# Patient Record
Sex: Male | Born: 1950 | Race: White | Hispanic: No | Marital: Married | Smoking: Never smoker
Health system: Southern US, Community
[De-identification: ages and names within clinical notes are randomized; demographics above are authoritative.]

## PROBLEM LIST (undated history)

## (undated) DIAGNOSIS — R059 Cough, unspecified: Secondary | ICD-10-CM

## (undated) DIAGNOSIS — E119 Type 2 diabetes mellitus without complications: Secondary | ICD-10-CM

## (undated) DIAGNOSIS — R05 Cough: Secondary | ICD-10-CM

## (undated) DIAGNOSIS — K219 Gastro-esophageal reflux disease without esophagitis: Secondary | ICD-10-CM

## (undated) DIAGNOSIS — E669 Obesity, unspecified: Secondary | ICD-10-CM

## (undated) DIAGNOSIS — I480 Paroxysmal atrial fibrillation: Secondary | ICD-10-CM

## (undated) DIAGNOSIS — E039 Hypothyroidism, unspecified: Secondary | ICD-10-CM

## (undated) DIAGNOSIS — E782 Mixed hyperlipidemia: Secondary | ICD-10-CM

## (undated) DIAGNOSIS — I10 Essential (primary) hypertension: Secondary | ICD-10-CM

## (undated) DIAGNOSIS — I509 Heart failure, unspecified: Secondary | ICD-10-CM

## (undated) DIAGNOSIS — I252 Old myocardial infarction: Secondary | ICD-10-CM

## (undated) DIAGNOSIS — Z95810 Presence of automatic (implantable) cardiac defibrillator: Secondary | ICD-10-CM

## (undated) DIAGNOSIS — R609 Edema, unspecified: Secondary | ICD-10-CM

## (undated) DIAGNOSIS — M1990 Unspecified osteoarthritis, unspecified site: Secondary | ICD-10-CM

## (undated) DIAGNOSIS — I200 Unstable angina: Secondary | ICD-10-CM

## (undated) DIAGNOSIS — I2510 Atherosclerotic heart disease of native coronary artery without angina pectoris: Secondary | ICD-10-CM

## (undated) DIAGNOSIS — Z951 Presence of aortocoronary bypass graft: Secondary | ICD-10-CM

## (undated) DIAGNOSIS — I25110 Atherosclerotic heart disease of native coronary artery with unstable angina pectoris: Secondary | ICD-10-CM

## (undated) HISTORY — PX: BACK SURGERY: SHX140

## (undated) HISTORY — PX: CHOLECYSTECTOMY: SHX55

## (undated) HISTORY — PX: EYE SURGERY: SHX253

---

## 2001-07-20 ENCOUNTER — Ambulatory Visit (HOSPITAL_BASED_OUTPATIENT_CLINIC_OR_DEPARTMENT_OTHER): Admission: RE | Admit: 2001-07-20 | Discharge: 2001-07-20 | Payer: Self-pay | Admitting: Orthopedic Surgery

## 2001-09-05 ENCOUNTER — Encounter: Admission: RE | Admit: 2001-09-05 | Discharge: 2001-09-05 | Payer: Self-pay

## 2001-09-14 ENCOUNTER — Encounter: Payer: Self-pay | Admitting: Neurosurgery

## 2001-09-15 ENCOUNTER — Encounter: Payer: Self-pay | Admitting: Neurosurgery

## 2001-09-16 ENCOUNTER — Inpatient Hospital Stay (HOSPITAL_COMMUNITY): Admission: AD | Admit: 2001-09-16 | Discharge: 2001-09-18 | Payer: Self-pay | Admitting: Neurosurgery

## 2009-08-19 ENCOUNTER — Inpatient Hospital Stay: Payer: Self-pay | Admitting: Surgery

## 2015-02-26 DIAGNOSIS — M1990 Unspecified osteoarthritis, unspecified site: Secondary | ICD-10-CM | POA: Diagnosis not present

## 2015-02-26 DIAGNOSIS — M7052 Other bursitis of knee, left knee: Secondary | ICD-10-CM | POA: Diagnosis not present

## 2015-02-26 DIAGNOSIS — L02212 Cutaneous abscess of back [any part, except buttock]: Secondary | ICD-10-CM | POA: Diagnosis not present

## 2015-02-26 DIAGNOSIS — Z6834 Body mass index (BMI) 34.0-34.9, adult: Secondary | ICD-10-CM | POA: Diagnosis not present

## 2015-03-07 DIAGNOSIS — M170 Bilateral primary osteoarthritis of knee: Secondary | ICD-10-CM | POA: Diagnosis not present

## 2015-04-01 DIAGNOSIS — E782 Mixed hyperlipidemia: Secondary | ICD-10-CM | POA: Diagnosis not present

## 2015-04-01 DIAGNOSIS — Z79899 Other long term (current) drug therapy: Secondary | ICD-10-CM | POA: Diagnosis not present

## 2015-04-01 DIAGNOSIS — E291 Testicular hypofunction: Secondary | ICD-10-CM | POA: Diagnosis not present

## 2015-04-01 DIAGNOSIS — E1165 Type 2 diabetes mellitus with hyperglycemia: Secondary | ICD-10-CM | POA: Diagnosis not present

## 2015-04-04 DIAGNOSIS — I10 Essential (primary) hypertension: Secondary | ICD-10-CM | POA: Diagnosis not present

## 2015-04-04 DIAGNOSIS — Z6834 Body mass index (BMI) 34.0-34.9, adult: Secondary | ICD-10-CM | POA: Diagnosis not present

## 2015-04-04 DIAGNOSIS — E782 Mixed hyperlipidemia: Secondary | ICD-10-CM | POA: Diagnosis not present

## 2015-04-04 DIAGNOSIS — E1165 Type 2 diabetes mellitus with hyperglycemia: Secondary | ICD-10-CM | POA: Diagnosis not present

## 2015-04-04 DIAGNOSIS — E291 Testicular hypofunction: Secondary | ICD-10-CM | POA: Diagnosis not present

## 2015-04-11 DIAGNOSIS — M1711 Unilateral primary osteoarthritis, right knee: Secondary | ICD-10-CM | POA: Diagnosis not present

## 2015-04-11 DIAGNOSIS — M25462 Effusion, left knee: Secondary | ICD-10-CM | POA: Diagnosis not present

## 2015-04-11 DIAGNOSIS — M25461 Effusion, right knee: Secondary | ICD-10-CM | POA: Diagnosis not present

## 2015-04-11 DIAGNOSIS — M1712 Unilateral primary osteoarthritis, left knee: Secondary | ICD-10-CM | POA: Diagnosis not present

## 2015-04-12 ENCOUNTER — Other Ambulatory Visit: Payer: Self-pay | Admitting: Orthopedic Surgery

## 2015-04-12 DIAGNOSIS — M25461 Effusion, right knee: Secondary | ICD-10-CM

## 2015-04-12 DIAGNOSIS — M25462 Effusion, left knee: Principal | ICD-10-CM

## 2015-04-19 ENCOUNTER — Ambulatory Visit
Admission: RE | Admit: 2015-04-19 | Discharge: 2015-04-19 | Disposition: A | Discharge status: Home / Self Care | Payer: Medicare Other | Source: Ambulatory Visit | Attending: Orthopedic Surgery | Admitting: Orthopedic Surgery

## 2015-04-19 DIAGNOSIS — M25461 Effusion, right knee: Secondary | ICD-10-CM

## 2015-04-19 DIAGNOSIS — M25462 Effusion, left knee: Principal | ICD-10-CM

## 2015-05-08 DIAGNOSIS — M170 Bilateral primary osteoarthritis of knee: Secondary | ICD-10-CM | POA: Diagnosis not present

## 2015-05-08 DIAGNOSIS — M25552 Pain in left hip: Secondary | ICD-10-CM | POA: Diagnosis not present

## 2015-05-17 DIAGNOSIS — M25462 Effusion, left knee: Secondary | ICD-10-CM | POA: Diagnosis not present

## 2015-05-17 DIAGNOSIS — S83282A Other tear of lateral meniscus, current injury, left knee, initial encounter: Secondary | ICD-10-CM | POA: Diagnosis not present

## 2015-05-17 DIAGNOSIS — S83232A Complex tear of medial meniscus, current injury, left knee, initial encounter: Secondary | ICD-10-CM | POA: Diagnosis not present

## 2015-07-29 DIAGNOSIS — E782 Mixed hyperlipidemia: Secondary | ICD-10-CM | POA: Diagnosis not present

## 2015-07-29 DIAGNOSIS — Z79899 Other long term (current) drug therapy: Secondary | ICD-10-CM | POA: Diagnosis not present

## 2015-07-29 DIAGNOSIS — E1165 Type 2 diabetes mellitus with hyperglycemia: Secondary | ICD-10-CM | POA: Diagnosis not present

## 2015-07-31 DIAGNOSIS — Z6833 Body mass index (BMI) 33.0-33.9, adult: Secondary | ICD-10-CM | POA: Diagnosis not present

## 2015-07-31 DIAGNOSIS — Z1389 Encounter for screening for other disorder: Secondary | ICD-10-CM | POA: Diagnosis not present

## 2015-07-31 DIAGNOSIS — E782 Mixed hyperlipidemia: Secondary | ICD-10-CM | POA: Diagnosis not present

## 2015-07-31 DIAGNOSIS — I10 Essential (primary) hypertension: Secondary | ICD-10-CM | POA: Diagnosis not present

## 2015-07-31 DIAGNOSIS — E1165 Type 2 diabetes mellitus with hyperglycemia: Secondary | ICD-10-CM | POA: Diagnosis not present

## 2015-07-31 DIAGNOSIS — Z23 Encounter for immunization: Secondary | ICD-10-CM | POA: Diagnosis not present

## 2015-07-31 DIAGNOSIS — E291 Testicular hypofunction: Secondary | ICD-10-CM | POA: Diagnosis not present

## 2015-07-31 DIAGNOSIS — Z9181 History of falling: Secondary | ICD-10-CM | POA: Diagnosis not present

## 2015-09-30 DIAGNOSIS — M23204 Derangement of unspecified medial meniscus due to old tear or injury, left knee: Secondary | ICD-10-CM | POA: Diagnosis not present

## 2015-09-30 DIAGNOSIS — M1712 Unilateral primary osteoarthritis, left knee: Secondary | ICD-10-CM | POA: Diagnosis not present

## 2015-09-30 DIAGNOSIS — M23201 Derangement of unspecified lateral meniscus due to old tear or injury, left knee: Secondary | ICD-10-CM | POA: Diagnosis not present

## 2015-10-03 ENCOUNTER — Encounter
Admission: RE | Admit: 2015-10-03 | Discharge: 2015-10-03 | Disposition: A | Discharge status: Home / Self Care | Payer: Medicare Other | Source: Ambulatory Visit | Attending: Orthopedic Surgery | Admitting: Orthopedic Surgery

## 2015-10-03 DIAGNOSIS — Z01812 Encounter for preprocedural laboratory examination: Secondary | ICD-10-CM | POA: Insufficient documentation

## 2015-10-03 DIAGNOSIS — Z01810 Encounter for preprocedural cardiovascular examination: Secondary | ICD-10-CM | POA: Insufficient documentation

## 2015-10-03 DIAGNOSIS — I10 Essential (primary) hypertension: Secondary | ICD-10-CM | POA: Diagnosis not present

## 2015-10-03 HISTORY — DX: Essential (primary) hypertension: I10

## 2015-10-03 HISTORY — DX: Gastro-esophageal reflux disease without esophagitis: K21.9

## 2015-10-03 HISTORY — DX: Unspecified osteoarthritis, unspecified site: M19.90

## 2015-10-03 HISTORY — DX: Type 2 diabetes mellitus without complications: E11.9

## 2015-10-03 LAB — BASIC METABOLIC PANEL
Anion gap: 11 (ref 5–15)
BUN: 22 mg/dL — ABNORMAL HIGH (ref 6–20)
CALCIUM: 9.8 mg/dL (ref 8.9–10.3)
CO2: 24 mmol/L (ref 22–32)
CREATININE: 1.15 mg/dL (ref 0.61–1.24)
Chloride: 99 mmol/L — ABNORMAL LOW (ref 101–111)
GLUCOSE: 272 mg/dL — AB (ref 65–99)
Potassium: 4.1 mmol/L (ref 3.5–5.1)
Sodium: 134 mmol/L — ABNORMAL LOW (ref 135–145)

## 2015-10-03 LAB — HEMOGLOBIN: Hemoglobin: 14.5 g/dL (ref 13.0–18.0)

## 2015-10-03 NOTE — Patient Instructions (Signed)
  Your procedure is scheduled on: October 08, 2015 (Tuesday) Report to Day Surgery.[facility]) Second Floor To find out your arrival time please call [phone] between 1PM - 3PM on October 07, 2015 (Monday).  Remember: Instructions that are not followed completely may result in serious medical risk, up to and including death, or upon the discretion of your surgeon and anesthesiologist your surgery may need to be rescheduled.    __x__ 1. Do not eat food or drink liquids after midnight. No gum chewing or hard candies.     ____ 2. No Alcohol for 24 hours before or after surgery.   ____ 3. Bring all medications with you on the day of surgery if instructed.    __x__ 4. Notify your doctor if there is any change in your medical condition     (cold, fever, infections).     Do not wear jewelry, make-up, hairpins, clips or nail polish.  Do not wear lotions, powders, or perfumes. You may wear deodorant.  Do not shave 48 hours prior to surgery. Men may shave face and neck.  Do not bring valuables to the hospital.    [facility] is not responsible for any belongings or valuables.               Contacts, dentures or bridgework may not be worn into surgery.  Leave your suitcase in the car. After surgery it may be brought to your room.  For patients admitted to the hospital, discharge time is determined by your                treatment team.   Patients discharged the day of surgery will not be allowed to drive home.   Please read over the following fact sheets that you were given:   Surgical Site Infection Prevention   ____ Take these medicines the morning of surgery with A SIP OF WATER:    1. Gemfibrozil  2. Carvedilol  3.   4.  5.  6.  ____ Fleet Enema (as directed)   __x__ Use CHG Soap as directed  ____ Use inhalers on the day of surgery  __x__ Stop metformin 2 days prior to surgery (STOP METFORMIN ON December 11)    ____ Take 1/2 of usual insulin dose the night before  surgery and none on the morning of surgery.   ____ Stop Coumadin/Plavix/aspirin on   __x__ Stop Anti-inflammatories on (TYLENOL OK TO TAKE FOR PAIN IF NEEDED)   ____ Stop supplements until after surgery.    ____ Bring C-Pap to the hospital.

## 2015-10-08 ENCOUNTER — Encounter
Admission: RE | Disposition: A | Discharge status: Home / Self Care | Payer: Self-pay | Source: Ambulatory Visit | Attending: Orthopedic Surgery

## 2015-10-08 ENCOUNTER — Ambulatory Visit: Payer: Medicare Other | Admitting: Anesthesiology

## 2015-10-08 ENCOUNTER — Ambulatory Visit
Admission: RE | Admit: 2015-10-08 | Discharge: 2015-10-08 | Disposition: A | Discharge status: Home / Self Care | Payer: Medicare Other | Source: Ambulatory Visit | Attending: Orthopedic Surgery | Admitting: Orthopedic Surgery

## 2015-10-08 ENCOUNTER — Encounter: Payer: Self-pay | Admitting: *Deleted

## 2015-10-08 DIAGNOSIS — M1990 Unspecified osteoarthritis, unspecified site: Secondary | ICD-10-CM | POA: Diagnosis not present

## 2015-10-08 DIAGNOSIS — Z9889 Other specified postprocedural states: Secondary | ICD-10-CM | POA: Diagnosis not present

## 2015-10-08 DIAGNOSIS — E785 Hyperlipidemia, unspecified: Secondary | ICD-10-CM | POA: Insufficient documentation

## 2015-10-08 DIAGNOSIS — M23252 Derangement of posterior horn of lateral meniscus due to old tear or injury, left knee: Secondary | ICD-10-CM | POA: Diagnosis not present

## 2015-10-08 DIAGNOSIS — Z9861 Coronary angioplasty status: Secondary | ICD-10-CM | POA: Insufficient documentation

## 2015-10-08 DIAGNOSIS — Z880 Allergy status to penicillin: Secondary | ICD-10-CM | POA: Diagnosis not present

## 2015-10-08 DIAGNOSIS — Z8249 Family history of ischemic heart disease and other diseases of the circulatory system: Secondary | ICD-10-CM | POA: Diagnosis not present

## 2015-10-08 DIAGNOSIS — E119 Type 2 diabetes mellitus without complications: Secondary | ICD-10-CM | POA: Diagnosis not present

## 2015-10-08 DIAGNOSIS — E291 Testicular hypofunction: Secondary | ICD-10-CM | POA: Diagnosis not present

## 2015-10-08 DIAGNOSIS — M2342 Loose body in knee, left knee: Secondary | ICD-10-CM | POA: Diagnosis not present

## 2015-10-08 DIAGNOSIS — Z888 Allergy status to other drugs, medicaments and biological substances status: Secondary | ICD-10-CM | POA: Insufficient documentation

## 2015-10-08 DIAGNOSIS — Z79899 Other long term (current) drug therapy: Secondary | ICD-10-CM | POA: Insufficient documentation

## 2015-10-08 DIAGNOSIS — I10 Essential (primary) hypertension: Secondary | ICD-10-CM | POA: Insufficient documentation

## 2015-10-08 DIAGNOSIS — M2392 Unspecified internal derangement of left knee: Secondary | ICD-10-CM | POA: Diagnosis present

## 2015-10-08 DIAGNOSIS — S83282A Other tear of lateral meniscus, current injury, left knee, initial encounter: Secondary | ICD-10-CM | POA: Diagnosis not present

## 2015-10-08 DIAGNOSIS — M23204 Derangement of unspecified medial meniscus due to old tear or injury, left knee: Secondary | ICD-10-CM | POA: Insufficient documentation

## 2015-10-08 DIAGNOSIS — M1712 Unilateral primary osteoarthritis, left knee: Secondary | ICD-10-CM | POA: Diagnosis not present

## 2015-10-08 DIAGNOSIS — M23201 Derangement of unspecified lateral meniscus due to old tear or injury, left knee: Secondary | ICD-10-CM | POA: Diagnosis not present

## 2015-10-08 DIAGNOSIS — Z886 Allergy status to analgesic agent status: Secondary | ICD-10-CM | POA: Insufficient documentation

## 2015-10-08 DIAGNOSIS — S83242A Other tear of medial meniscus, current injury, left knee, initial encounter: Secondary | ICD-10-CM | POA: Diagnosis not present

## 2015-10-08 DIAGNOSIS — M6752 Plica syndrome, left knee: Secondary | ICD-10-CM | POA: Diagnosis not present

## 2015-10-08 DIAGNOSIS — S80852A Superficial foreign body, left lower leg, initial encounter: Secondary | ICD-10-CM | POA: Diagnosis not present

## 2015-10-08 HISTORY — PX: KNEE ARTHROSCOPY: SHX127

## 2015-10-08 LAB — GLUCOSE, CAPILLARY
Glucose-Capillary: 204 mg/dL — ABNORMAL HIGH (ref 65–99)
Glucose-Capillary: 138 mg/dL — ABNORMAL HIGH (ref 65–99)

## 2015-10-08 SURGERY — ARTHROSCOPY, KNEE
Anesthesia: General | Site: Knee | Laterality: Left | Wound class: Clean

## 2015-10-08 MED ORDER — ONDANSETRON HCL 4 MG/2ML IJ SOLN
INTRAMUSCULAR | Status: DC | PRN
  Administered 2015-10-08: 4 mg via INTRAVENOUS

## 2015-10-08 MED ORDER — BUPIVACAINE-EPINEPHRINE (PF) 0.5% -1:200000 IJ SOLN
INTRAMUSCULAR | Status: DC | PRN
  Administered 2015-10-08: 30 mL

## 2015-10-08 MED ORDER — ONDANSETRON HCL 4 MG/2ML IJ SOLN: 4.0000 mg | Freq: Four times a day (QID) | INTRAMUSCULAR | Status: DC | PRN

## 2015-10-08 MED ORDER — FENTANYL CITRATE (PF) 100 MCG/2ML IJ SOLN
INTRAMUSCULAR | Status: AC
  Administered 2015-10-08: 25 ug via INTRAVENOUS
  Filled 2015-10-08: qty 2

## 2015-10-08 MED ORDER — METOCLOPRAMIDE HCL 10 MG PO TABS: 5.0000 mg | ORAL_TABLET | Freq: Three times a day (TID) | ORAL | Status: DC | PRN

## 2015-10-08 MED ORDER — SODIUM CHLORIDE 0.9 % IV SOLN
INTRAVENOUS | Status: DC
  Administered 2015-10-08: 11:00:00 via INTRAVENOUS

## 2015-10-08 MED ORDER — LACTATED RINGERS IV SOLN
INTRAVENOUS | Status: DC | PRN
  Administered 2015-10-08: 12:00:00 via INTRAVENOUS

## 2015-10-08 MED ORDER — ONDANSETRON HCL 4 MG/2ML IJ SOLN
INTRAMUSCULAR | Status: AC
  Administered 2015-10-08: 4 mg via INTRAVENOUS
  Filled 2015-10-08: qty 2

## 2015-10-08 MED ORDER — HYDROCODONE-ACETAMINOPHEN 5-325 MG PO TABS
1.0000 | ORAL_TABLET | ORAL | Status: DC | PRN
  Administered 2015-10-08: 1 via ORAL

## 2015-10-08 MED ORDER — MIDAZOLAM HCL 2 MG/2ML IJ SOLN
INTRAMUSCULAR | Status: DC | PRN
  Administered 2015-10-08: 2 mg via INTRAVENOUS

## 2015-10-08 MED ORDER — HYDROCODONE-ACETAMINOPHEN 5-325 MG PO TABS
ORAL_TABLET | ORAL | Status: AC
  Filled 2015-10-08: qty 1

## 2015-10-08 MED ORDER — PROPOFOL 10 MG/ML IV BOLUS
INTRAVENOUS | Status: DC | PRN
  Administered 2015-10-08: 200 mg via INTRAVENOUS

## 2015-10-08 MED ORDER — BUPIVACAINE-EPINEPHRINE (PF) 0.5% -1:200000 IJ SOLN
INTRAMUSCULAR | Status: AC
  Filled 2015-10-08: qty 30

## 2015-10-08 MED ORDER — FENTANYL CITRATE (PF) 100 MCG/2ML IJ SOLN
INTRAMUSCULAR | Status: DC | PRN
  Administered 2015-10-08 (×4): 25 ug via INTRAVENOUS

## 2015-10-08 MED ORDER — SODIUM CHLORIDE 0.9 % IV SOLN: INTRAVENOUS | Status: DC

## 2015-10-08 MED ORDER — FAMOTIDINE 20 MG PO TABS
ORAL_TABLET | ORAL | Status: AC
  Filled 2015-10-08: qty 1

## 2015-10-08 MED ORDER — FAMOTIDINE 20 MG PO TABS
20.0000 mg | ORAL_TABLET | Freq: Once | ORAL | Status: AC
  Administered 2015-10-08: 20 mg via ORAL

## 2015-10-08 MED ORDER — LIDOCAINE HCL (CARDIAC) 20 MG/ML IV SOLN
INTRAVENOUS | Status: DC | PRN
  Administered 2015-10-08: 40 mg via INTRAVENOUS

## 2015-10-08 MED ORDER — METOCLOPRAMIDE HCL 5 MG/ML IJ SOLN: 5.0000 mg | Freq: Three times a day (TID) | INTRAMUSCULAR | Status: DC | PRN

## 2015-10-08 MED ORDER — ONDANSETRON HCL 4 MG/2ML IJ SOLN
4.0000 mg | Freq: Once | INTRAMUSCULAR | Status: AC | PRN
  Administered 2015-10-08: 4 mg via INTRAVENOUS

## 2015-10-08 MED ORDER — HYDROCODONE-ACETAMINOPHEN 5-325 MG PO TABS: 1.0000 | ORAL_TABLET | Freq: Four times a day (QID) | ORAL | Status: DC | PRN

## 2015-10-08 MED ORDER — ONDANSETRON HCL 4 MG PO TABS: 4.0000 mg | ORAL_TABLET | Freq: Four times a day (QID) | ORAL | Status: DC | PRN

## 2015-10-08 MED ORDER — FENTANYL CITRATE (PF) 100 MCG/2ML IJ SOLN
25.0000 ug | INTRAMUSCULAR | Status: DC | PRN
  Administered 2015-10-08 (×4): 25 ug via INTRAVENOUS

## 2015-10-08 SURGICAL SUPPLY — 33 items
ADAPTER IRRIG TUBE 2 SPIKE SOL (ADAPTER) ×4 IMPLANT
BANDAGE ACE 4X5 VEL STRL LF (GAUZE/BANDAGES/DRESSINGS) IMPLANT
BANDAGE ELASTIC 4 LF NS (GAUZE/BANDAGES/DRESSINGS) ×2 IMPLANT
BLADE FULL RADIUS 3.5 (BLADE) IMPLANT
BLADE INCISOR PLUS 4.5 (BLADE) ×2 IMPLANT
BLADE SHAVER 4.5 DBL SERAT CV (CUTTER) IMPLANT
BLADE SHAVER 4.5X7 STR FR (MISCELLANEOUS) ×2 IMPLANT
CHLORAPREP W/TINT 26ML (MISCELLANEOUS) ×2 IMPLANT
CUTTER AGGRESSIVE+ 3.5 (CUTTER) IMPLANT
GAUZE PETRO XEROFOAM 1X8 (MISCELLANEOUS) ×2 IMPLANT
GAUZE SPONGE 4X4 12PLY STRL (GAUZE/BANDAGES/DRESSINGS) ×2 IMPLANT
GLOVE BIO SURGEON STRL SZ7 (GLOVE) ×2 IMPLANT
GLOVE BIOGEL PI IND STRL 9 (GLOVE) ×1 IMPLANT
GLOVE BIOGEL PI INDICATOR 9 (GLOVE) ×1
GLOVE INDICATOR 7.5 STRL GRN (GLOVE) ×2 IMPLANT
GLOVE SURG ORTHO 9.0 STRL STRW (GLOVE) ×4 IMPLANT
GOWN SPECIALTY ULTRA XL (MISCELLANEOUS) ×2 IMPLANT
GOWN STRL REUS W/ TWL LRG LVL3 (GOWN DISPOSABLE) ×1 IMPLANT
GOWN STRL REUS W/TWL LRG LVL3 (GOWN DISPOSABLE) ×1
IV LACTATED RINGER IRRG 3000ML (IV SOLUTION)
IV LR IRRIG 3000ML ARTHROMATIC (IV SOLUTION) IMPLANT
KIT RM TURNOVER STRD PROC AR (KITS) ×2 IMPLANT
MANIFOLD NEPTUNE II (INSTRUMENTS) ×2 IMPLANT
PACK ARTHROSCOPY KNEE (MISCELLANEOUS) ×2 IMPLANT
SET TUBE SUCT SHAVER OUTFL 24K (TUBING) ×2 IMPLANT
SET TUBE TIP INTRA-ARTICULAR (MISCELLANEOUS) ×2 IMPLANT
SOL .9 NS 3000ML IRR  AL (IV SOLUTION) ×4
SOL .9 NS 3000ML IRR UROMATIC (IV SOLUTION) ×4 IMPLANT
SUT ETHILON 4-0 (SUTURE)
SUT ETHILON 4-0 FS2 18XMFL BLK (SUTURE)
SUTURE ETHLN 4-0 FS2 18XMF BLK (SUTURE) IMPLANT
TUBING ARTHRO INFLOW-ONLY STRL (TUBING) ×2 IMPLANT
WAND HAND CNTRL MULTIVAC 50 (MISCELLANEOUS) ×2 IMPLANT

## 2015-10-08 NOTE — Anesthesia Postprocedure Evaluation (Signed)
Anesthesia Post Note  Patient: Jason AnisJohn C [name]  Procedure(s) Performed: Procedure(s) (LRB): ARTHROSCOPY KNEE, PARTIAL MEDIAL AND LATERAL MENISECTOMY, REMOVAL OF FOREIGN BODY (Left)  Patient location during evaluation: PACU Anesthesia Type: General Level of consciousness: awake and alert Pain management: pain level controlled Vital Signs Assessment: post-procedure vital signs reviewed and stable Respiratory status: spontaneous breathing and respiratory function stable Cardiovascular status: blood pressure returned to baseline and stable Anesthetic complications: no    Last Vitals:  Filed Vitals:   10/08/15 1424 10/08/15 1502  BP: 125/71 115/52  Pulse: 70 74  Temp: 36.1 C   Resp: 16 16    Last Pain:  Filed Vitals:   10/08/15 1523  PainSc: 5                  [name] K

## 2015-10-08 NOTE — H&P (Signed)
Reviewed paper H+P, will be scanned into chart. No changes noted.  

## 2015-10-08 NOTE — Anesthesia Preprocedure Evaluation (Signed)
Anesthesia Evaluation  Patient identified by MRN, date of birth, ID band Patient awake    Reviewed: Allergy & Precautions, NPO status , Patient's Chart, lab work & pertinent test results  History of Anesthesia Complications Negative for: history of anesthetic complications  Airway Mallampati: III       Dental  (+) Edentulous Upper, Edentulous Lower   Pulmonary neg pulmonary ROS,           Cardiovascular hypertension, Pt. on medications and Pt. on home beta blockers      Neuro/Psych negative neurological ROS     GI/Hepatic Neg liver ROS, GERD  Medicated and Controlled,  Endo/Other  diabetes, Type 2, Oral Hypoglycemic Agents  Renal/GU negative Renal ROS     Musculoskeletal   Abdominal   Peds  Hematology negative hematology ROS (+)   Anesthesia Other Findings   Reproductive/Obstetrics                             Anesthesia Physical Anesthesia Plan  ASA: III  Anesthesia Plan: General   Post-op Pain Management:    Induction: Intravenous  Airway Management Planned: LMA  Additional Equipment:   Intra-op Plan:   Post-operative Plan:   Informed Consent: I have reviewed the patients History and Physical, chart, labs and discussed the procedure including the risks, benefits and alternatives for the proposed anesthesia with the patient or authorized representative who has indicated his/her understanding and acceptance.     Plan Discussed with:   Anesthesia Plan Comments:         Anesthesia Quick Evaluation

## 2015-10-08 NOTE — Op Note (Signed)
10/08/2015  1:04 PM  PATIENT:  Jason [name]  64 y.o. male  PRE-OPERATIVE DIAGNOSIS:  arthritis, medial & lateral meniscus tear left knee  POST-OPERATIVE DIAGNOSIS:  arthritis, medial & lateral meniscus tear left knee, foreign body  PROCEDURE:  Procedure(s): ARTHROSCOPY KNEE, PARTIAL MEDIAL AND LATERAL MENISECTOMY, REMOVAL OF FOREIGN BODY (Left)  SURGEON: Leitha SchullerMichael J Eretria Manternach, MD  ASSISTANTS: None  ANESTHESIA:   general  EBL:    minimal  BLOOD ADMINISTERED:none  DRAINS: none   LOCAL MEDICATIONS USED:  MARCAINE     SPECIMEN:  No Specimen  DISPOSITION OF SPECIMEN:  N/A  COUNTS:  YES  TOURNIQUET:   none  IMPLANTS: None  DICTATION: .Dragon Dictation patient brought the operating room and after adequate anesthesia was obtained, the left leg was prepped and draped in sterile fashion with a arthroscopic leg holder and tourniquet applied. After patient identification and timeout procedures were completed, the knee was entered through an inferolateral portal and arthroscope introduced. 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 After after initial diagnostic evaluation showing significant medial compartment degenerative change intact anterior cruciate ligament and apparent anterior and middle tears of the lateral meniscus an inferior medial portal was made and on probing there was a tear of the posterior horn degenerative flap-type tear involving most the posterior third this is debrided with meniscal punch and a shaver and ArthriCare wand to a stable margin. In the notch there anterior cruciate ligament was noted to be intact to probing. There was a loose body present which was removed and socially a picture taken of it outside the joint. Going to the lateral compartment there is more extensive tear involving the middle posterior and anterior thirds of the  meniscus meniscal Punch shaver and ArthriCare wand were used to get back to stable margins. 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 After addressing the meniscal tears the plica band was debrided and the loose body having been removed the knee was irrigated until clear. Pre-and post procedure pictures having been obtained. The wounds are closed simple interrupted 4-0 nylon skin suture and injected with 20 cc half percent Sensorcaine with epinephrine in postop analgesia. Dressings of Xeroform 4 x 4 web roll and Ace wrap applied  PLAN OF CARE: Discharge to home after PACU  PATIENT DISPOSITION:  PACU - hemodynamically stable.

## 2015-10-08 NOTE — Transfer of Care (Signed)
Immediate Anesthesia Transfer of Care Note  Patient: Elpidio AnisJohn C Loving  Procedure(s) Performed: Procedure(s): ARTHROSCOPY KNEE, PARTIAL MEDIAL AND LATERAL MENISECTOMY, REMOVAL OF FOREIGN BODY (Left)  Patient Location: PACU  Anesthesia Type:General  Level of Consciousness: sedated  Airway & Oxygen Therapy: Patient connected to face mask oxygen  Post-op Assessment: Report given to RN  Post vital signs: stable  Last Vitals:  Filed Vitals:   10/08/15 1016 10/08/15 1311  BP: 116/66 123/69  Pulse: 83 79  Temp: 36.9 C 36.6 C  Resp: 16 17    Complications: No apparent anesthesia complications

## 2015-10-08 NOTE — Discharge Instructions (Addendum)
Weightbearing as tolerated on left leg. Minimize activities until recheck.  General Anesthesia, Adult, Care After Refer to this sheet in the next few weeks. These instructions provide you with information on caring for yourself after your procedure. Your health care provider may also give you more specific instructions. Your treatment has been planned according to current medical practices, but problems sometimes occur. Call your health care provider if you have any problems or questions after your procedure. WHAT TO EXPECT AFTER THE PROCEDURE After the procedure, it is typical to experience:  Sleepiness.  Nausea and vomiting. HOME CARE INSTRUCTIONS  For the first 24 hours after general anesthesia:  Have a responsible person with you.  Do not drive a car. If you are alone, do not take public transportation.  Do not drink alcohol.  Do not take medicine that has not been prescribed by your health care provider.  Do not sign important papers or make important decisions.  You may resume a normal diet and activities as directed by your health care provider.  Change bandages (dressings) as directed.  If you have questions or problems that seem related to general anesthesia, call the hospital and ask for the anesthetist or anesthesiologist on call. SEEK MEDICAL CARE IF:  You have nausea and vomiting that continue the day after anesthesia.  You develop a rash. SEEK IMMEDIATE MEDICAL CARE IF:   You have difficulty breathing.  You have chest pain.  You have any allergic problems.   This information is not intended to replace advice given to you by your health care provider. Make sure you discuss any questions you have with your health care provider.   Document Released: 01/18/2001 Document Revised: 11/02/2014 Document Reviewed: 02/10/2012 Elsevier Interactive Patient Education [employer]2016 [employer]vier Inc.

## 2015-12-02 DIAGNOSIS — E782 Mixed hyperlipidemia: Secondary | ICD-10-CM | POA: Diagnosis not present

## 2015-12-02 DIAGNOSIS — E1165 Type 2 diabetes mellitus with hyperglycemia: Secondary | ICD-10-CM | POA: Diagnosis not present

## 2015-12-02 DIAGNOSIS — Z79899 Other long term (current) drug therapy: Secondary | ICD-10-CM | POA: Diagnosis not present

## 2015-12-09 DIAGNOSIS — Z789 Other specified health status: Secondary | ICD-10-CM | POA: Diagnosis not present

## 2015-12-09 DIAGNOSIS — I10 Essential (primary) hypertension: Secondary | ICD-10-CM | POA: Diagnosis not present

## 2015-12-09 DIAGNOSIS — E782 Mixed hyperlipidemia: Secondary | ICD-10-CM | POA: Diagnosis not present

## 2015-12-09 DIAGNOSIS — E1165 Type 2 diabetes mellitus with hyperglycemia: Secondary | ICD-10-CM | POA: Diagnosis not present

## 2015-12-09 DIAGNOSIS — E669 Obesity, unspecified: Secondary | ICD-10-CM | POA: Diagnosis not present

## 2015-12-09 DIAGNOSIS — Z1389 Encounter for screening for other disorder: Secondary | ICD-10-CM | POA: Diagnosis not present

## 2015-12-09 DIAGNOSIS — Z6833 Body mass index (BMI) 33.0-33.9, adult: Secondary | ICD-10-CM | POA: Diagnosis not present

## 2015-12-12 ENCOUNTER — Other Ambulatory Visit: Payer: Self-pay | Admitting: Ophthalmology

## 2015-12-12 DIAGNOSIS — H4922 Sixth [abducent] nerve palsy, left eye: Secondary | ICD-10-CM

## 2015-12-19 ENCOUNTER — Other Ambulatory Visit
Admission: RE | Admit: 2015-12-19 | Discharge: 2015-12-19 | Disposition: A | Discharge status: Home / Self Care | Payer: Medicare Other | Source: Ambulatory Visit | Attending: Ophthalmology | Admitting: Ophthalmology

## 2015-12-19 ENCOUNTER — Ambulatory Visit
Admission: RE | Admit: 2015-12-19 | Discharge: 2015-12-19 | Disposition: A | Discharge status: Home / Self Care | Payer: Medicare Other | Source: Ambulatory Visit | Attending: Ophthalmology | Admitting: Ophthalmology

## 2015-12-19 DIAGNOSIS — H4922 Sixth [abducent] nerve palsy, left eye: Secondary | ICD-10-CM | POA: Insufficient documentation

## 2015-12-19 DIAGNOSIS — R51 Headache: Secondary | ICD-10-CM | POA: Diagnosis not present

## 2015-12-19 LAB — CREATININE, SERUM
CREATININE: 1.19 mg/dL (ref 0.61–1.24)
GFR calc Af Amer: >60 mL/min (ref >60)
GFR calc non Af Amer: >60 mL/min (ref >60)

## 2015-12-19 MED ORDER — GADOBENATE DIMEGLUMINE 529 MG/ML IV SOLN
20.0000 mL | Freq: Once | INTRAVENOUS | Status: AC | PRN
  Administered 2015-12-19: 20 mL via INTRAVENOUS

## 2016-01-01 ENCOUNTER — Ambulatory Visit: Payer: Medicare Other

## 2016-01-14 DIAGNOSIS — H2511 Age-related nuclear cataract, right eye: Secondary | ICD-10-CM | POA: Diagnosis not present

## 2016-01-20 ENCOUNTER — Encounter: Payer: Self-pay | Admitting: *Deleted

## 2016-01-23 ENCOUNTER — Ambulatory Visit
Admission: RE | Admit: 2016-01-23 | Discharge: 2016-01-23 | Disposition: A | Discharge status: Home / Self Care | Payer: Medicare Other | Source: Ambulatory Visit | Attending: Ophthalmology | Admitting: Ophthalmology

## 2016-01-23 ENCOUNTER — Ambulatory Visit: Payer: Medicare Other | Admitting: Certified Registered Nurse Anesthetist

## 2016-01-23 ENCOUNTER — Encounter
Admission: RE | Disposition: A | Discharge status: Home / Self Care | Payer: Self-pay | Source: Ambulatory Visit | Attending: Ophthalmology

## 2016-01-23 ENCOUNTER — Encounter: Payer: Self-pay | Admitting: *Deleted

## 2016-01-23 DIAGNOSIS — Z880 Allergy status to penicillin: Secondary | ICD-10-CM | POA: Insufficient documentation

## 2016-01-23 DIAGNOSIS — Z886 Allergy status to analgesic agent status: Secondary | ICD-10-CM | POA: Diagnosis not present

## 2016-01-23 DIAGNOSIS — E7800 Pure hypercholesterolemia, unspecified: Secondary | ICD-10-CM | POA: Diagnosis not present

## 2016-01-23 DIAGNOSIS — E119 Type 2 diabetes mellitus without complications: Secondary | ICD-10-CM | POA: Insufficient documentation

## 2016-01-23 DIAGNOSIS — M1990 Unspecified osteoarthritis, unspecified site: Secondary | ICD-10-CM | POA: Insufficient documentation

## 2016-01-23 DIAGNOSIS — F17220 Nicotine dependence, chewing tobacco, uncomplicated: Secondary | ICD-10-CM | POA: Diagnosis not present

## 2016-01-23 DIAGNOSIS — Z79899 Other long term (current) drug therapy: Secondary | ICD-10-CM | POA: Insufficient documentation

## 2016-01-23 DIAGNOSIS — I10 Essential (primary) hypertension: Secondary | ICD-10-CM | POA: Diagnosis not present

## 2016-01-23 DIAGNOSIS — Z9889 Other specified postprocedural states: Secondary | ICD-10-CM | POA: Insufficient documentation

## 2016-01-23 DIAGNOSIS — Z7984 Long term (current) use of oral hypoglycemic drugs: Secondary | ICD-10-CM | POA: Diagnosis not present

## 2016-01-23 DIAGNOSIS — H269 Unspecified cataract: Secondary | ICD-10-CM | POA: Diagnosis present

## 2016-01-23 DIAGNOSIS — Z9849 Cataract extraction status, unspecified eye: Secondary | ICD-10-CM | POA: Insufficient documentation

## 2016-01-23 DIAGNOSIS — H2511 Age-related nuclear cataract, right eye: Secondary | ICD-10-CM | POA: Insufficient documentation

## 2016-01-23 DIAGNOSIS — Z9049 Acquired absence of other specified parts of digestive tract: Secondary | ICD-10-CM | POA: Diagnosis not present

## 2016-01-23 HISTORY — DX: Edema, unspecified: R60.9

## 2016-01-23 HISTORY — DX: Cough, unspecified: R05.9

## 2016-01-23 HISTORY — PX: CATARACT EXTRACTION W/PHACO: SHX586

## 2016-01-23 HISTORY — DX: Cough: R05

## 2016-01-23 LAB — GLUCOSE, CAPILLARY: GLUCOSE-CAPILLARY: 185 mg/dL — AB (ref 65–99)

## 2016-01-23 SURGERY — PHACOEMULSIFICATION, CATARACT, WITH IOL INSERTION
Anesthesia: Monitor Anesthesia Care | Site: Eye | Laterality: Right | Wound class: Clean

## 2016-01-23 MED ORDER — MOXIFLOXACIN HCL 0.5 % OP SOLN
OPHTHALMIC | Status: AC
  Filled 2016-01-23: qty 3

## 2016-01-23 MED ORDER — NA CHONDROIT SULF-NA HYALURON 40-17 MG/ML IO SOLN
INTRAOCULAR | Status: DC | PRN
  Administered 2016-01-23: 1 mL via INTRAOCULAR

## 2016-01-23 MED ORDER — EPINEPHRINE HCL 1 MG/ML IJ SOLN
INTRAMUSCULAR | Status: AC
  Filled 2016-01-23: qty 1

## 2016-01-23 MED ORDER — TETRACAINE HCL 0.5 % OP SOLN
1.0000 [drp] | OPHTHALMIC | Status: DC | PRN
  Administered 2016-01-23 (×2): 1 [drp] via OPHTHALMIC

## 2016-01-23 MED ORDER — CARBACHOL 0.01 % IO SOLN
INTRAOCULAR | Status: DC | PRN
  Administered 2016-01-23: .5 mL via INTRAOCULAR

## 2016-01-23 MED ORDER — PHENYLEPHRINE HCL 10 % OP SOLN
OPHTHALMIC | Status: AC
  Filled 2016-01-23: qty 5

## 2016-01-23 MED ORDER — PHENYLEPHRINE HCL 10 % OP SOLN
1.0000 [drp] | OPHTHALMIC | Status: AC | PRN
  Administered 2016-01-23 (×4): 1 [drp] via OPHTHALMIC

## 2016-01-23 MED ORDER — NA CHONDROIT SULF-NA HYALURON 40-17 MG/ML IO SOLN
INTRAOCULAR | Status: AC
Start: 2016-01-23 — End: 2016-01-23
  Filled 2016-01-23: qty 1

## 2016-01-23 MED ORDER — FENTANYL CITRATE (PF) 100 MCG/2ML IJ SOLN
INTRAMUSCULAR | Status: DC | PRN
  Administered 2016-01-23: 50 ug via INTRAVENOUS

## 2016-01-23 MED ORDER — POVIDONE-IODINE 5 % OP SOLN
OPHTHALMIC | Status: AC
  Filled 2016-01-23: qty 30

## 2016-01-23 MED ORDER — TETRACAINE HCL 0.5 % OP SOLN
OPHTHALMIC | Status: AC
  Filled 2016-01-23: qty 2

## 2016-01-23 MED ORDER — MOXIFLOXACIN HCL 0.5 % OP SOLN
1.0000 [drp] | OPHTHALMIC | Status: AC | PRN
  Administered 2016-01-23 (×3): 1 [drp] via OPHTHALMIC

## 2016-01-23 MED ORDER — SODIUM CHLORIDE 0.9 % IV SOLN
INTRAVENOUS | Status: DC
  Administered 2016-01-23: 08:00:00 via INTRAVENOUS

## 2016-01-23 MED ORDER — EPINEPHRINE HCL 1 MG/ML IJ SOLN
INTRAMUSCULAR | Status: DC | PRN
  Administered 2016-01-23: 1 mL via OPHTHALMIC

## 2016-01-23 MED ORDER — CEFUROXIME OPHTHALMIC INJECTION 1 MG/0.1 ML
INJECTION | OPHTHALMIC | Status: AC
  Filled 2016-01-23: qty 0.1

## 2016-01-23 MED ORDER — LIDOCAINE HCL 3.5 % OP GEL
1.0000 "application " | Freq: Once | OPHTHALMIC | Status: AC
  Administered 2016-01-23: 1 via OPHTHALMIC

## 2016-01-23 MED ORDER — CYCLOPENTOLATE HCL 2 % OP SOLN
OPHTHALMIC | Status: AC
  Filled 2016-01-23: qty 2

## 2016-01-23 MED ORDER — MOXIFLOXACIN HCL 0.5 % OP SOLN
OPHTHALMIC | Status: DC | PRN
  Administered 2016-01-23: 1 [drp] via OPHTHALMIC

## 2016-01-23 MED ORDER — MIDAZOLAM HCL 5 MG/5ML IJ SOLN
INTRAMUSCULAR | Status: DC | PRN
Start: 2016-01-23 — End: 2016-01-23
  Administered 2016-01-23: 1 mg via INTRAVENOUS

## 2016-01-23 MED ORDER — POVIDONE-IODINE 5 % OP SOLN
1.0000 "application " | Freq: Once | OPHTHALMIC | Status: AC
  Administered 2016-01-23: 1 via OPHTHALMIC

## 2016-01-23 MED ORDER — LIDOCAINE HCL 3.5 % OP GEL
OPHTHALMIC | Status: AC
  Filled 2016-01-23: qty 1

## 2016-01-23 MED ORDER — CYCLOPENTOLATE HCL 2 % OP SOLN
1.0000 [drp] | OPHTHALMIC | Status: AC | PRN
  Administered 2016-01-23 (×4): 1 [drp] via OPHTHALMIC

## 2016-01-23 SURGICAL SUPPLY — 22 items
CANNULA ANT/CHMB 27GA (MISCELLANEOUS) ×2 IMPLANT
CUP MEDICINE 2OZ PLAST GRAD ST (MISCELLANEOUS) ×2 IMPLANT
GLOVE BIO SURGEON STRL SZ8 (GLOVE) ×2 IMPLANT
GLOVE BIOGEL M 6.5 STRL (GLOVE) ×2 IMPLANT
GLOVE SURG LX 8.0 MICRO (GLOVE) ×1
GLOVE SURG LX STRL 8.0 MICRO (GLOVE) ×1 IMPLANT
GOWN STRL REUS W/ TWL LRG LVL3 (GOWN DISPOSABLE) ×2 IMPLANT
GOWN STRL REUS W/TWL LRG LVL3 (GOWN DISPOSABLE) ×2
LENS IOL TECNIS 19.5 (Intraocular Lens) ×2 IMPLANT
LENS IOL TECNIS MONO 1P 19.5 (Intraocular Lens) ×1 IMPLANT
PACK CATARACT (MISCELLANEOUS) ×2 IMPLANT
PACK CATARACT BRASINGTON LX (MISCELLANEOUS) ×2 IMPLANT
PACK EYE AFTER SURG (MISCELLANEOUS) ×2 IMPLANT
SOL BSS BAG (MISCELLANEOUS) ×2
SOL PREP PVP 2OZ (MISCELLANEOUS) ×2
SOLUTION BSS BAG (MISCELLANEOUS) ×1 IMPLANT
SOLUTION PREP PVP 2OZ (MISCELLANEOUS) ×1 IMPLANT
SYR 3ML LL SCALE MARK (SYRINGE) ×2 IMPLANT
SYR 5ML LL (SYRINGE) ×2 IMPLANT
SYR TB 1ML 27GX1/2 LL (SYRINGE) ×2 IMPLANT
WATER STERILE IRR 1000ML POUR (IV SOLUTION) ×2 IMPLANT
WIPE NON LINTING 3.25X3.25 (MISCELLANEOUS) ×2 IMPLANT

## 2016-01-23 NOTE — H&P (Signed)
All labs reviewed. Abnormal studies sent to patients PCP when indicated.  Previous H&P reviewed, patient examined, there are NO CHANGES.  Jason [name] LOUIS3/30/20179:15 AM

## 2016-01-23 NOTE — Transfer of Care (Signed)
Immediate Anesthesia Transfer of Care Note  Patient: Jason [name]  Procedure(s) Performed: Procedure(s) with comments: CATARACT EXTRACTION PHACO AND INTRAOCULAR LENS PLACEMENT (IOC) (Right) - US 01:18 AP% 23.8 CDE 18.67 fluid pack lot # [id number]  Patient Location: PACU and Short Stay  Anesthesia Type:MAC  Level of Consciousness: awake, alert  and oriented  Airway & Oxygen Therapy: Patient Spontanous Breathing and Patient connected to nasal cannula oxygen  Post-op Assessment: Report given to RN and Post -op Vital signs reviewed and stable  Post vital signs: Reviewed and stable  Last Vitals:  Filed Vitals:   01/23/16 0811 01/23/16 0945  BP: 114/70   Pulse: 79   Temp: 36.4 C 36.5 C  Resp: 16     Complications: No apparent anesthesia complications

## 2016-01-23 NOTE — Anesthesia Preprocedure Evaluation (Addendum)
Anesthesia Evaluation  Patient identified by MRN, date of birth, ID band Patient awake    Reviewed: Allergy & Precautions, NPO status , Patient's Chart, lab work & pertinent test results, reviewed documented beta blocker date and time   History of Anesthesia Complications Negative for: history of anesthetic complications  Airway Mallampati: III       Dental  (+) Edentulous Upper, Edentulous Lower   Pulmonary neg pulmonary ROS,    Pulmonary exam normal        Cardiovascular hypertension, Pt. on medications and Pt. on home beta blockers Normal cardiovascular exam     Neuro/Psych negative neurological ROS  negative psych ROS   GI/Hepatic Neg liver ROS, GERD  Medicated and Controlled,  Endo/Other  diabetes, Well Controlled, Type 2, Oral Hypoglycemic Agents  Renal/GU negative Renal ROS  negative genitourinary   Musculoskeletal negative musculoskeletal ROS (+) Arthritis , Osteoarthritis,    Abdominal Normal abdominal exam  (+)   Peds negative pediatric ROS (+)  Hematology negative hematology ROS (+)   Anesthesia Other Findings   Reproductive/Obstetrics                           Anesthesia Physical  Anesthesia Plan  ASA: III  Anesthesia Plan: MAC   Post-op Pain Management:    Induction: Intravenous  Airway Management Planned: Nasal Cannula  Additional Equipment:   Intra-op Plan:   Post-operative Plan:   Informed Consent: I have reviewed the patients History and Physical, chart, labs and discussed the procedure including the risks, benefits and alternatives for the proposed anesthesia with the patient or authorized representative who has indicated his/her understanding and acceptance.   Dental advisory given  Plan Discussed with: CRNA and Surgeon  Anesthesia Plan Comments:        Anesthesia Quick Evaluation

## 2016-01-23 NOTE — Op Note (Signed)
PREOPERATIVE DIAGNOSIS:  Nuclear sclerotic cataract of the right eye.   POSTOPERATIVE DIAGNOSIS:  nuclear sclerotic cataract riht eye   OPERATIVE PROCEDURE: Procedure(s): CATARACT EXTRACTION PHACO AND INTRAOCULAR LENS PLACEMENT (IOC)   SURGEON:  Galen ManilaWilliam Trayveon Beckford, MD.   ANESTHESIA:  Anesthesiologist: Yves DillPaul Carroll, MD CRNA: Michaele OfferKasey Savage, CRNA; Derinda Lateonald Iacone, CRNA  1.      Managed anesthesia care. 2.      Topical tetracaine drops followed by 2% Xylocaine jelly applied in the preoperative holding area.   COMPLICATIONS:  None.   TECHNIQUE:   Stop and chop   DESCRIPTION OF PROCEDURE:  The patient was examined and consented in the preoperative holding area where the aforementioned topical anesthesia was applied to the right eye and then brought back to the Operating Room where the right eye was prepped and draped in the usual sterile ophthalmic fashion and a lid speculum was placed. A paracentesis was created with the side port blade and the anterior chamber was filled with viscoelastic. A near clear corneal incision was performed with the steel keratome. A continuous curvilinear capsulorrhexis was performed with a cystotome followed by the capsulorrhexis forceps. Hydrodissection and hydrodelineation were carried out with BSS on a blunt cannula. The lens was removed in a stop and chop  technique and the remaining cortical material was removed with the irrigation-aspiration handpiece. The capsular bag was inflated with viscoelastic and the Technis [id number]  lens was placed in the capsular bag without complication. The remaining viscoelastic was removed from the eye with the irrigation-aspiration handpiece. The wounds were hydrated. The anterior chamber was flushed with Miostat and the eye was inflated to physiologic pressure. 0.2 mL of Vigamox diluted three/one with BSS was placed in the anterior chamber. The wounds were found to be water tight. The eye was dressed with Vigamox. The patient was given  protective glasses to wear throughout the day and a shield with which to sleep tonight. The patient was also given drops with which to begin a drop regimen today and will follow-up with me in one day.  Implant Name Type Inv. Item Serial No. Manufacturer Lot No. LRB No. Used  LENS IOL TECNIS 19.5 - [id number] Intraocular Lens LENS IOL TECNIS 19.5 [id number]   Right 1   Procedure(s) with comments: CATARACT EXTRACTION PHACO AND INTRAOCULAR LENS PLACEMENT (IOC) (Right) - US 01:18 AP% 23.8 CDE 18.67 fluid pack lot # [id number]  Electronically signed: Shekela Goodridge LOUIS 01/23/2016 9:41 AM

## 2016-01-23 NOTE — Discharge Instructions (Signed)
AMBULATORY SURGERY  DISCHARGE INSTRUCTIONS   1) The drugs that you were given will stay in your system until tomorrow so for the next 24 hours you should not:  A) Drive an automobile B) Make any legal decisions C) Drink any alcoholic beverage   2) You may resume regular meals tomorrow.  Today it is better to start with liquids and gradually work up to solid foods.  You may eat anything you prefer, but it is better to start with liquids, then soup and crackers, and gradually work up to solid foods.   3) Please notify your doctor immediately if you have any unusual bleeding, trouble breathing, redness and pain at the surgery site, drainage, fever, or pain not relieved by medication.    4) Additional Instructions:   Eye Surgery Discharge Instructions  Expect mild scratchy sensation or mild soreness. DO NOT RUB YOUR EYE!  The day of surgery:  Minimal physical activity, but bed rest is not required  No reading, computer work, or close hand work  No bending, lifting, or straining.  May watch TV  For 24 hours:  No driving, legal decisions, or alcoholic beverages  Safety precautions  Eat anything you prefer: It is better to start with liquids, then soup then solid foods.  _____ Eye patch should be worn until postoperative exam tomorrow.  ____ Solar shield eyeglasses should be worn for comfort in the sunlight/patch while sleeping  Resume all regular medications including aspirin or Coumadin if these were discontinued prior to surgery. You may shower, bathe, shave, or wash your hair. Tylenol may be taken for mild discomfort.  Call your doctor if you experience significant pain, nausea, or vomiting, fever > 101 or other signs of infection. [phone] or [phone] Specific instructions:  Follow-up Information    Follow up with PORFILIO,WILLIAM LOUIS, MD In 1 day.   Specialty:  Ophthalmology   Why:  March 31 at 9:00am   Contact information:   [address] [place] [address] [phone]          Please contact your physician with any problems or Same Day Surgery at [phone], Monday through Friday 6 am to 4 pm, or [place] at [facility] Main number at [phone].

## 2016-01-23 NOTE — Anesthesia Postprocedure Evaluation (Signed)
Anesthesia Post Note  Patient: Jason AnisJohn C Bernet  Procedure(s) Performed: Procedure(s) (LRB): CATARACT EXTRACTION PHACO AND INTRAOCULAR LENS PLACEMENT (IOC) (Right)  Patient location during evaluation: Short Stay Anesthesia Type: MAC Level of consciousness: awake and alert Pain management: satisfactory to patient Vital Signs Assessment: post-procedure vital signs reviewed and stable Respiratory status: spontaneous breathing Cardiovascular status: blood pressure returned to baseline and stable Postop Assessment: no headache Anesthetic complications: no Comments: VSS, nAD, A & O X 3    Last Vitals:  Filed Vitals:   01/23/16 0811 01/23/16 0945  BP: 114/70   Pulse: 79   Temp: 36.4 C 36.5 C  Resp: 16     Last Pain: There were no vitals filed for this visit.               Veronica Fretz, RON

## 2016-04-07 DIAGNOSIS — R079 Chest pain, unspecified: Secondary | ICD-10-CM | POA: Diagnosis not present

## 2016-04-07 DIAGNOSIS — Z125 Encounter for screening for malignant neoplasm of prostate: Secondary | ICD-10-CM | POA: Diagnosis not present

## 2016-04-07 DIAGNOSIS — Z79899 Other long term (current) drug therapy: Secondary | ICD-10-CM | POA: Diagnosis not present

## 2016-04-07 DIAGNOSIS — E782 Mixed hyperlipidemia: Secondary | ICD-10-CM | POA: Diagnosis not present

## 2016-04-07 DIAGNOSIS — E1165 Type 2 diabetes mellitus with hyperglycemia: Secondary | ICD-10-CM | POA: Diagnosis not present

## 2016-04-07 DIAGNOSIS — Z6833 Body mass index (BMI) 33.0-33.9, adult: Secondary | ICD-10-CM | POA: Diagnosis not present

## 2016-04-10 DIAGNOSIS — I10 Essential (primary) hypertension: Secondary | ICD-10-CM | POA: Diagnosis not present

## 2016-04-10 DIAGNOSIS — E782 Mixed hyperlipidemia: Secondary | ICD-10-CM | POA: Diagnosis not present

## 2016-04-10 DIAGNOSIS — Z01818 Encounter for other preprocedural examination: Secondary | ICD-10-CM | POA: Diagnosis not present

## 2016-04-10 DIAGNOSIS — I208 Other forms of angina pectoris: Secondary | ICD-10-CM | POA: Diagnosis not present

## 2016-04-13 ENCOUNTER — Inpatient Hospital Stay (HOSPITAL_COMMUNITY): Payer: Medicare Other

## 2016-04-13 ENCOUNTER — Encounter (HOSPITAL_COMMUNITY): Payer: Self-pay | Admitting: Thoracic Surgery (Cardiothoracic Vascular Surgery)

## 2016-04-13 ENCOUNTER — Inpatient Hospital Stay (HOSPITAL_COMMUNITY)
Admission: AD | Admit: 2016-04-13 | Discharge: 2016-05-06 | DRG: 236 | Disposition: A | Discharge status: Home Health | Payer: Medicare Other | Source: Other Acute Inpatient Hospital | Attending: Thoracic Surgery (Cardiothoracic Vascular Surgery) | Admitting: Thoracic Surgery (Cardiothoracic Vascular Surgery)

## 2016-04-13 ENCOUNTER — Encounter
Admission: RE | Disposition: A | Discharge status: Other Acute Inpatient Hospital | Payer: Self-pay | Source: Ambulatory Visit | Attending: Internal Medicine

## 2016-04-13 ENCOUNTER — Ambulatory Visit
Admission: RE | Admit: 2016-04-13 | Discharge: 2016-04-13 | Disposition: A | Discharge status: Other Acute Inpatient Hospital | Payer: Medicare Other | Source: Ambulatory Visit | Attending: Internal Medicine | Admitting: Internal Medicine

## 2016-04-13 ENCOUNTER — Other Ambulatory Visit: Payer: Self-pay

## 2016-04-13 DIAGNOSIS — E782 Mixed hyperlipidemia: Secondary | ICD-10-CM | POA: Diagnosis present

## 2016-04-13 DIAGNOSIS — Z87892 Personal history of anaphylaxis: Secondary | ICD-10-CM

## 2016-04-13 DIAGNOSIS — Z6833 Body mass index (BMI) 33.0-33.9, adult: Secondary | ICD-10-CM | POA: Diagnosis not present

## 2016-04-13 DIAGNOSIS — I10 Essential (primary) hypertension: Secondary | ICD-10-CM | POA: Insufficient documentation

## 2016-04-13 DIAGNOSIS — Z880 Allergy status to penicillin: Secondary | ICD-10-CM

## 2016-04-13 DIAGNOSIS — I2582 Chronic total occlusion of coronary artery: Secondary | ICD-10-CM | POA: Diagnosis present

## 2016-04-13 DIAGNOSIS — K219 Gastro-esophageal reflux disease without esophagitis: Secondary | ICD-10-CM | POA: Diagnosis present

## 2016-04-13 DIAGNOSIS — E7800 Pure hypercholesterolemia, unspecified: Secondary | ICD-10-CM | POA: Diagnosis not present

## 2016-04-13 DIAGNOSIS — E669 Obesity, unspecified: Secondary | ICD-10-CM | POA: Diagnosis present

## 2016-04-13 DIAGNOSIS — F17220 Nicotine dependence, chewing tobacco, uncomplicated: Secondary | ICD-10-CM | POA: Diagnosis present

## 2016-04-13 DIAGNOSIS — I2510 Atherosclerotic heart disease of native coronary artery without angina pectoris: Secondary | ICD-10-CM | POA: Diagnosis not present

## 2016-04-13 DIAGNOSIS — Z886 Allergy status to analgesic agent status: Secondary | ICD-10-CM

## 2016-04-13 DIAGNOSIS — J9589 Other postprocedural complications and disorders of respiratory system, not elsewhere classified: Secondary | ICD-10-CM | POA: Diagnosis not present

## 2016-04-13 DIAGNOSIS — I25118 Atherosclerotic heart disease of native coronary artery with other forms of angina pectoris: Secondary | ICD-10-CM | POA: Diagnosis not present

## 2016-04-13 DIAGNOSIS — I110 Hypertensive heart disease with heart failure: Secondary | ICD-10-CM | POA: Diagnosis present

## 2016-04-13 DIAGNOSIS — I25110 Atherosclerotic heart disease of native coronary artery with unstable angina pectoris: Principal | ICD-10-CM | POA: Diagnosis present

## 2016-04-13 DIAGNOSIS — F17210 Nicotine dependence, cigarettes, uncomplicated: Secondary | ICD-10-CM | POA: Insufficient documentation

## 2016-04-13 DIAGNOSIS — Z7984 Long term (current) use of oral hypoglycemic drugs: Secondary | ICD-10-CM | POA: Diagnosis not present

## 2016-04-13 DIAGNOSIS — I4581 Long QT syndrome: Secondary | ICD-10-CM | POA: Diagnosis not present

## 2016-04-13 DIAGNOSIS — I080 Rheumatic disorders of both mitral and aortic valves: Secondary | ICD-10-CM | POA: Diagnosis not present

## 2016-04-13 DIAGNOSIS — Y813 Surgical instruments, materials and general- and plastic-surgery devices (including sutures) associated with adverse incidents: Secondary | ICD-10-CM | POA: Diagnosis not present

## 2016-04-13 DIAGNOSIS — I454 Nonspecific intraventricular block: Secondary | ICD-10-CM | POA: Diagnosis not present

## 2016-04-13 DIAGNOSIS — K9189 Other postprocedural complications and disorders of digestive system: Secondary | ICD-10-CM | POA: Diagnosis not present

## 2016-04-13 DIAGNOSIS — Y92230 Patient room in hospital as the place of occurrence of the external cause: Secondary | ICD-10-CM | POA: Diagnosis not present

## 2016-04-13 DIAGNOSIS — D62 Acute posthemorrhagic anemia: Secondary | ICD-10-CM | POA: Diagnosis not present

## 2016-04-13 DIAGNOSIS — R05 Cough: Secondary | ICD-10-CM | POA: Diagnosis not present

## 2016-04-13 DIAGNOSIS — Z951 Presence of aortocoronary bypass graft: Secondary | ICD-10-CM

## 2016-04-13 DIAGNOSIS — K6389 Other specified diseases of intestine: Secondary | ICD-10-CM | POA: Diagnosis not present

## 2016-04-13 DIAGNOSIS — R110 Nausea: Secondary | ICD-10-CM | POA: Diagnosis not present

## 2016-04-13 DIAGNOSIS — I200 Unstable angina: Secondary | ICD-10-CM | POA: Diagnosis present

## 2016-04-13 DIAGNOSIS — E8770 Fluid overload, unspecified: Secondary | ICD-10-CM | POA: Diagnosis not present

## 2016-04-13 DIAGNOSIS — E46 Unspecified protein-calorie malnutrition: Secondary | ICD-10-CM | POA: Diagnosis not present

## 2016-04-13 DIAGNOSIS — T814XXA Infection following a procedure, initial encounter: Secondary | ICD-10-CM | POA: Diagnosis not present

## 2016-04-13 DIAGNOSIS — L7632 Postprocedural hematoma of skin and subcutaneous tissue following other procedure: Secondary | ICD-10-CM | POA: Diagnosis not present

## 2016-04-13 DIAGNOSIS — L0390 Cellulitis, unspecified: Secondary | ICD-10-CM | POA: Diagnosis not present

## 2016-04-13 DIAGNOSIS — Z888 Allergy status to other drugs, medicaments and biological substances status: Secondary | ICD-10-CM

## 2016-04-13 DIAGNOSIS — Y838 Other surgical procedures as the cause of abnormal reaction of the patient, or of later complication, without mention of misadventure at the time of the procedure: Secondary | ICD-10-CM | POA: Diagnosis not present

## 2016-04-13 DIAGNOSIS — E119 Type 2 diabetes mellitus without complications: Secondary | ICD-10-CM

## 2016-04-13 DIAGNOSIS — R140 Abdominal distension (gaseous): Secondary | ICD-10-CM | POA: Diagnosis not present

## 2016-04-13 DIAGNOSIS — L03115 Cellulitis of right lower limb: Secondary | ICD-10-CM | POA: Diagnosis not present

## 2016-04-13 DIAGNOSIS — I4891 Unspecified atrial fibrillation: Secondary | ICD-10-CM | POA: Diagnosis not present

## 2016-04-13 DIAGNOSIS — R0602 Shortness of breath: Secondary | ICD-10-CM | POA: Diagnosis not present

## 2016-04-13 DIAGNOSIS — K560 Paralytic ileus: Secondary | ICD-10-CM | POA: Diagnosis not present

## 2016-04-13 DIAGNOSIS — J9811 Atelectasis: Secondary | ICD-10-CM

## 2016-04-13 DIAGNOSIS — K567 Ileus, unspecified: Secondary | ICD-10-CM | POA: Diagnosis not present

## 2016-04-13 DIAGNOSIS — Z7902 Long term (current) use of antithrombotics/antiplatelets: Secondary | ICD-10-CM

## 2016-04-13 DIAGNOSIS — I5042 Chronic combined systolic (congestive) and diastolic (congestive) heart failure: Secondary | ICD-10-CM | POA: Diagnosis present

## 2016-04-13 DIAGNOSIS — Z4659 Encounter for fitting and adjustment of other gastrointestinal appliance and device: Secondary | ICD-10-CM

## 2016-04-13 DIAGNOSIS — R079 Chest pain, unspecified: Secondary | ICD-10-CM | POA: Diagnosis not present

## 2016-04-13 DIAGNOSIS — J90 Pleural effusion, not elsewhere classified: Secondary | ICD-10-CM | POA: Diagnosis not present

## 2016-04-13 DIAGNOSIS — Z79899 Other long term (current) drug therapy: Secondary | ICD-10-CM | POA: Diagnosis not present

## 2016-04-13 DIAGNOSIS — Z01810 Encounter for preprocedural cardiovascular examination: Secondary | ICD-10-CM | POA: Diagnosis not present

## 2016-04-13 DIAGNOSIS — Z4682 Encounter for fitting and adjustment of non-vascular catheter: Secondary | ICD-10-CM | POA: Diagnosis not present

## 2016-04-13 DIAGNOSIS — Z01811 Encounter for preprocedural respiratory examination: Secondary | ICD-10-CM

## 2016-04-13 DIAGNOSIS — R112 Nausea with vomiting, unspecified: Secondary | ICD-10-CM | POA: Diagnosis not present

## 2016-04-13 HISTORY — DX: Presence of aortocoronary bypass graft: Z95.1

## 2016-04-13 HISTORY — DX: Unstable angina: I20.0

## 2016-04-13 HISTORY — DX: Atherosclerotic heart disease of native coronary artery with unstable angina pectoris: I25.110

## 2016-04-13 HISTORY — PX: CARDIAC CATHETERIZATION: SHX172

## 2016-04-13 HISTORY — DX: Type 2 diabetes mellitus without complications: E11.9

## 2016-04-13 HISTORY — DX: Essential (primary) hypertension: I10

## 2016-04-13 HISTORY — DX: Atherosclerotic heart disease of native coronary artery without angina pectoris: I25.10

## 2016-04-13 HISTORY — DX: Mixed hyperlipidemia: E78.2

## 2016-04-13 HISTORY — DX: Obesity, unspecified: E66.9

## 2016-04-13 LAB — CBC WITH DIFFERENTIAL/PLATELET
BASOS ABS: 0.1 10*3/uL (ref 0.0–0.1)
BASOS PCT: 1 %
Eosinophils Absolute: 0.5 10*3/uL (ref 0.0–0.7)
Eosinophils Relative: 7 %
HEMATOCRIT: 41.8 % (ref 39.0–52.0)
HEMOGLOBIN: 13.9 g/dL (ref 13.0–17.0)
LYMPHS PCT: 23 %
Lymphs Abs: 1.6 10*3/uL (ref 0.7–4.0)
MCH: 30 pg (ref 26.0–34.0)
MCHC: 33.3 g/dL (ref 30.0–36.0)
MCV: 90.3 fL (ref 78.0–100.0)
MONO ABS: 0.6 10*3/uL (ref 0.1–1.0)
MONOS PCT: 8 %
NEUTROS ABS: 4.1 10*3/uL (ref 1.7–7.7)
NEUTROS PCT: 61 %
Platelets: 159 10*3/uL (ref 150–400)
RBC: 4.63 MIL/uL (ref 4.22–5.81)
RDW: 12.9 % (ref 11.5–15.5)
WBC: 6.8 10*3/uL (ref 4.0–10.5)

## 2016-04-13 LAB — COMPREHENSIVE METABOLIC PANEL
ALT: 26 U/L (ref 17–63)
AST: 34 U/L (ref 15–41)
Albumin: 3.7 g/dL (ref 3.5–5.0)
Alkaline Phosphatase: 51 U/L (ref 38–126)
Anion gap: 6 (ref 5–15)
BUN: 14 mg/dL (ref 6–20)
CHLORIDE: 102 mmol/L (ref 101–111)
CO2: 27 mmol/L (ref 22–32)
CREATININE: 1 mg/dL (ref 0.61–1.24)
Calcium: 9.9 mg/dL (ref 8.9–10.3)
GFR calc Af Amer: >60 mL/min (ref >60)
GFR calc non Af Amer: >60 mL/min (ref >60)
Glucose, Bld: 156 mg/dL — ABNORMAL HIGH (ref 65–99)
POTASSIUM: 3.8 mmol/L (ref 3.5–5.1)
SODIUM: 135 mmol/L (ref 135–145)
Total Bilirubin: 0.9 mg/dL (ref 0.3–1.2)
Total Protein: 6.7 g/dL (ref 6.5–8.1)

## 2016-04-13 LAB — URINALYSIS, ROUTINE W REFLEX MICROSCOPIC
BILIRUBIN URINE: NEGATIVE
HGB URINE DIPSTICK: NEGATIVE
KETONES UR: NEGATIVE mg/dL
LEUKOCYTES UA: NEGATIVE
Nitrite: NEGATIVE
PH: 5.5 (ref 5.0–8.0)
PROTEIN: NEGATIVE mg/dL
Specific Gravity, Urine: 1.015 (ref 1.005–1.030)

## 2016-04-13 LAB — LIPID PANEL
Cholesterol: 191 mg/dL (ref 0–200)
HDL: 25 mg/dL — AB (ref >40)
LDL CALC: 127 mg/dL — AB (ref 0–99)
Total CHOL/HDL Ratio: 7.6 RATIO
Triglycerides: 193 mg/dL — ABNORMAL HIGH (ref <150)
VLDL: 39 mg/dL (ref 0–40)

## 2016-04-13 LAB — PROTIME-INR
INR: 1.17 (ref 0.00–1.49)
Prothrombin Time: 15.1 seconds (ref 11.6–15.2)

## 2016-04-13 LAB — URINE MICROSCOPIC-ADD ON

## 2016-04-13 LAB — TROPONIN I: TROPONIN I: 0.05 ng/mL — AB (ref <0.031)

## 2016-04-13 LAB — TYPE AND SCREEN
ABO/RH(D): A NEG
ANTIBODY SCREEN: NEGATIVE

## 2016-04-13 LAB — TSH: TSH: 2.151 u[IU]/mL (ref 0.350–4.500)

## 2016-04-13 LAB — GLUCOSE, CAPILLARY: GLUCOSE-CAPILLARY: 134 mg/dL — AB (ref 65–99)

## 2016-04-13 LAB — APTT: APTT: 27 s (ref 24–37)

## 2016-04-13 LAB — ABO/RH: ABO/RH(D): A NEG

## 2016-04-13 SURGERY — LEFT HEART CATH AND CORONARY ANGIOGRAPHY
Anesthesia: Moderate Sedation | Laterality: Left

## 2016-04-13 MED ORDER — POTASSIUM CHLORIDE 2 MEQ/ML IV SOLN
80.0000 meq | INTRAVENOUS | Status: DC
  Filled 2016-04-13: qty 40

## 2016-04-13 MED ORDER — HEPARIN (PORCINE) IN NACL 100-0.45 UNIT/ML-% IJ SOLN
1400.0000 [IU]/h | INTRAMUSCULAR | Status: DC
  Administered 2016-04-13: 1400 [IU]/h via INTRAVENOUS
  Filled 2016-04-13: qty 250

## 2016-04-13 MED ORDER — SODIUM CHLORIDE 0.9 % IV SOLN: 250.0000 mL | INTRAVENOUS | Status: DC | PRN

## 2016-04-13 MED ORDER — SODIUM CHLORIDE 0.9 % IV SOLN
INTRAVENOUS | Status: DC
  Administered 2016-04-13: 12:00:00 via INTRAVENOUS

## 2016-04-13 MED ORDER — BISACODYL 5 MG PO TBEC
5.0000 mg | DELAYED_RELEASE_TABLET | Freq: Once | ORAL | Status: AC
  Administered 2016-04-13: 5 mg via ORAL
  Filled 2016-04-13: qty 1

## 2016-04-13 MED ORDER — EPINEPHRINE HCL 1 MG/ML IJ SOLN
0.0000 ug/min | INTRAVENOUS | Status: DC
  Filled 2016-04-13: qty 4

## 2016-04-13 MED ORDER — SODIUM CHLORIDE 0.9% FLUSH: 3.0000 mL | INTRAVENOUS | Status: DC | PRN

## 2016-04-13 MED ORDER — PLASMA-LYTE 148 IV SOLN
INTRAVENOUS | Status: AC
  Administered 2016-04-14: 10:00:00
  Filled 2016-04-13: qty 2.5

## 2016-04-13 MED ORDER — VANCOMYCIN HCL 1000 MG IV SOLR
INTRAVENOUS | Status: AC
  Administered 2016-04-14: 1000 mL
  Filled 2016-04-13: qty 1000

## 2016-04-13 MED ORDER — MIDAZOLAM HCL 2 MG/2ML IJ SOLN
INTRAMUSCULAR | Status: AC
  Filled 2016-04-13: qty 2

## 2016-04-13 MED ORDER — ONDANSETRON HCL 4 MG/2ML IJ SOLN: 4.0000 mg | Freq: Once | INTRAMUSCULAR | Status: DC | PRN

## 2016-04-13 MED ORDER — VANCOMYCIN HCL 10 G IV SOLR
1500.0000 mg | INTRAVENOUS | Status: AC
  Administered 2016-04-14: 1000 mg via INTRAVENOUS
  Filled 2016-04-13: qty 1500

## 2016-04-13 MED ORDER — SODIUM CHLORIDE 0.9 % IV SOLN
INTRAVENOUS | Status: AC
  Administered 2016-04-14: 69.8 mL/h via INTRAVENOUS
  Filled 2016-04-13: qty 40

## 2016-04-13 MED ORDER — SODIUM CHLORIDE 0.9 % WEIGHT BASED INFUSION: 3.0000 mL/kg/h | INTRAVENOUS | Status: AC

## 2016-04-13 MED ORDER — CHLORHEXIDINE GLUCONATE 4 % EX LIQD
60.0000 mL | Freq: Once | CUTANEOUS | Status: AC
  Administered 2016-04-13: 4 via TOPICAL
  Filled 2016-04-13: qty 60

## 2016-04-13 MED ORDER — MAGNESIUM SULFATE 50 % IJ SOLN
40.0000 meq | INTRAMUSCULAR | Status: DC
  Filled 2016-04-13: qty 10

## 2016-04-13 MED ORDER — SODIUM CHLORIDE 0.9 % IV SOLN
INTRAVENOUS | Status: AC
  Administered 2016-04-14: 2 [IU]/h via INTRAVENOUS
  Filled 2016-04-13: qty 2.5

## 2016-04-13 MED ORDER — ATORVASTATIN CALCIUM 80 MG PO TABS
80.0000 mg | ORAL_TABLET | Freq: Every day | ORAL | Status: DC
  Administered 2016-04-13: 80 mg via ORAL
  Filled 2016-04-13: qty 1

## 2016-04-13 MED ORDER — IOPAMIDOL (ISOVUE-300) INJECTION 61%
INTRAVENOUS | Status: DC | PRN
  Administered 2016-04-13: 130 mL via INTRA_ARTERIAL

## 2016-04-13 MED ORDER — ISOSORBIDE MONONITRATE ER 30 MG PO TB24: 30.0000 mg | ORAL_TABLET | Freq: Every day | ORAL | Status: DC

## 2016-04-13 MED ORDER — CHLORHEXIDINE GLUCONATE 4 % EX LIQD
60.0000 mL | Freq: Once | CUTANEOUS | Status: AC
  Administered 2016-04-14: 4 via TOPICAL
  Filled 2016-04-13: qty 60

## 2016-04-13 MED ORDER — ACETAMINOPHEN 325 MG PO TABS: 650.0000 mg | ORAL_TABLET | ORAL | Status: DC | PRN

## 2016-04-13 MED ORDER — NITROGLYCERIN 0.4 MG SL SUBL: 0.4000 mg | SUBLINGUAL_TABLET | SUBLINGUAL | Status: DC | PRN

## 2016-04-13 MED ORDER — MIDAZOLAM HCL 2 MG/2ML IJ SOLN
INTRAMUSCULAR | Status: DC | PRN
  Administered 2016-04-13: 1 mg via INTRAVENOUS

## 2016-04-13 MED ORDER — FENTANYL CITRATE (PF) 100 MCG/2ML IJ SOLN
INTRAMUSCULAR | Status: AC
  Filled 2016-04-13: qty 2

## 2016-04-13 MED ORDER — DEXMEDETOMIDINE HCL IN NACL 400 MCG/100ML IV SOLN
0.1000 ug/kg/h | INTRAVENOUS | Status: AC
  Administered 2016-04-14: .2 ug/kg/h via INTRAVENOUS
  Filled 2016-04-13: qty 100

## 2016-04-13 MED ORDER — CHLORHEXIDINE GLUCONATE 0.12 % MT SOLN
15.0000 mL | Freq: Once | OROMUCOSAL | Status: AC
  Administered 2016-04-14: 15 mL via OROMUCOSAL
  Filled 2016-04-13: qty 15

## 2016-04-13 MED ORDER — SODIUM CHLORIDE 0.9% FLUSH: 3.0000 mL | Freq: Two times a day (BID) | INTRAVENOUS | Status: DC

## 2016-04-13 MED ORDER — FENTANYL CITRATE (PF) 100 MCG/2ML IJ SOLN
INTRAMUSCULAR | Status: DC | PRN
  Administered 2016-04-13: 25 ug via INTRAVENOUS

## 2016-04-13 MED ORDER — ONDANSETRON HCL 4 MG/2ML IJ SOLN: 4.0000 mg | Freq: Four times a day (QID) | INTRAMUSCULAR | Status: DC | PRN

## 2016-04-13 MED ORDER — METOPROLOL TARTRATE 12.5 MG HALF TABLET
12.5000 mg | ORAL_TABLET | Freq: Once | ORAL | Status: AC
  Administered 2016-04-14: 12.5 mg via ORAL
  Filled 2016-04-13: qty 1

## 2016-04-13 MED ORDER — HEPARIN (PORCINE) IN NACL 2-0.9 UNIT/ML-% IJ SOLN
INTRAMUSCULAR | Status: AC
  Filled 2016-04-13: qty 1000

## 2016-04-13 MED ORDER — PHENYLEPHRINE HCL 10 MG/ML IJ SOLN
30.0000 ug/min | INTRAMUSCULAR | Status: AC
  Administered 2016-04-14: 10 ug/min via INTRAVENOUS
  Filled 2016-04-13: qty 2

## 2016-04-13 MED ORDER — NITROGLYCERIN IN D5W 200-5 MCG/ML-% IV SOLN
2.0000 ug/min | INTRAVENOUS | Status: AC
  Administered 2016-04-14: 5 ug/min via INTRAVENOUS
  Filled 2016-04-13: qty 250

## 2016-04-13 MED ORDER — FENTANYL CITRATE (PF) 100 MCG/2ML IJ SOLN: 25.0000 ug | INTRAMUSCULAR | Status: DC | PRN

## 2016-04-13 MED ORDER — LEVOFLOXACIN IN D5W 500 MG/100ML IV SOLN
500.0000 mg | INTRAVENOUS | Status: AC
  Administered 2016-04-14: 500 mg via INTRAVENOUS
  Filled 2016-04-13: qty 100

## 2016-04-13 MED ORDER — TEMAZEPAM 7.5 MG PO CAPS: 15.0000 mg | ORAL_CAPSULE | Freq: Once | ORAL | Status: DC | PRN

## 2016-04-13 MED ORDER — CARVEDILOL 12.5 MG PO TABS
12.5000 mg | ORAL_TABLET | Freq: Two times a day (BID) | ORAL | Status: DC
  Administered 2016-04-13: 12.5 mg via ORAL
  Filled 2016-04-13: qty 1

## 2016-04-13 MED ORDER — DOPAMINE-DEXTROSE 3.2-5 MG/ML-% IV SOLN
0.0000 ug/kg/min | INTRAVENOUS | Status: DC
  Filled 2016-04-13: qty 250

## 2016-04-13 MED ORDER — INSULIN ASPART 100 UNIT/ML ~~LOC~~ SOLN
0.0000 [IU] | SUBCUTANEOUS | Status: DC
  Administered 2016-04-13: 2 [IU] via SUBCUTANEOUS
  Administered 2016-04-14 (×2): 4 [IU] via SUBCUTANEOUS
  Administered 2016-04-14: 7 [IU] via SUBCUTANEOUS

## 2016-04-13 MED ORDER — SODIUM CHLORIDE 0.9 % IV SOLN
INTRAVENOUS | Status: DC
  Filled 2016-04-13: qty 30

## 2016-04-13 SURGICAL SUPPLY — 9 items
CATH INFINITI 5FR ANG PIGTAIL (CATHETERS) ×2 IMPLANT
CATH INFINITI 5FR JL4 (CATHETERS) ×2 IMPLANT
CATH INFINITI JR4 5F (CATHETERS) ×2 IMPLANT
DEVICE CLOSURE MYNXGRIP 5F (Vascular Products) ×2 IMPLANT
KIT MANI 3VAL PERCEP (MISCELLANEOUS) ×2 IMPLANT
NEEDLE PERC 18GX7CM (NEEDLE) ×4 IMPLANT
PACK CARDIAC CATH (CUSTOM PROCEDURE TRAY) ×2 IMPLANT
SHEATH PINNACLE 5F 10CM (SHEATH) ×2 IMPLANT
WIRE EMERALD 3MM-J .035X150CM (WIRE) ×2 IMPLANT

## 2016-04-13 NOTE — H&P (Addendum)
[address]       [place] [address]             [phone]          CARDIOTHORACIC SURGERY HISTORY AND PHYSICAL EXAM  PCP is Ailene Ravel, MD Referring Provider is [name], MD  Chief Complaint:  Chest pain  HPI:  Patient is a 65 year old obese white male with no previous history of coronary artery disease but risk factors notable for history of essential hypertension, type 2 diabetes mellitus, and mixed hyperlipidemia who presents with symptoms of chest pain consistent with unstable angina pectoris and has been transferred from [facility] for management of 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 The patient states that he first began to experience symptoms of chest discomfort off and on more than a year ago. Symptoms waxed and waned in severity and seemed a bit atypical in nature. Approximately 3-4 weeks ago the patient began to experience significant increased in severity and frequency of pain. Symptoms are described as a pressure-like pain radiating across the upper chest and occasionally to the left shoulder or back. Symptoms are typically alleviated associated with activity and within 5 minutes of rest.  However, the patient states that sometimes he can perform more strenuous activity without getting any chest discomfort, and other times he seems like he can't do much of anything. He has had 1 or 2 episodes of chest discomfort awakening him from his sleep at night. He has never had an episode of chest discomfort lasting more than 5-10 minutes. Symptoms are not typically associated with shortness of breath or nausea. The patient was referred for cardiology consultation and promptly scheduled for diagnostic catheterization by Dr. Gwen Pounds. Catheterization performed earlier today reveals what appears to be chronic subtotal occlusion of the left main coronary artery with chronic occlusion of the left anterior descending coronary artery.  There  are well-developed collaterals filling the left anterior descending coronary artery from the right coronary circulation. Left ventricular function is at least moderately reduced. The patient was transferred to [facility] for further management. Upon arrival the patient states that he has not had any chest discomfort today and his last episode of severe pain was several days ago.  Patient is married and lives with his wife in [place]. 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 He does not exercise to any significant degree but he has not had any significant physical limitations prior to recently. He denies any history of PND, orthopnea, palpitations, or dizzy spells. He has mild chronic lower extremity edema. He only gets short of breath with more strenuous exertion, but he jokes that he avoids exertion as much as possible.   Past Medical History  Diagnosis Date  . Hypertension   . Diabetes mellitus without complication (HCC)   . GERD (gastroesophageal reflux disease)   . Arthritis   . Cough     CHRONIC  . Edema     FEET/LEGS  . 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 Unstable angina (HCC) 04/13/2016  . 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 Type II diabetes mellitus (HCC)   . Essential hypertension   . Mixed hyperlipidemia   . Obesity     Past Surgical History  Procedure Laterality Date  . Cholecystectomy    . [place] surgery  [facility] [place]  . Eye surgery Left     Cataract Extraction with IOL implant  . Knee arthroscopy Left 10/08/2015    Procedure: ARTHROSCOPY KNEE, PARTIAL MEDIAL AND LATERAL MENISECTOMY, REMOVAL OF FOREIGN BODY;  Surgeon: Kennedy BuckerMichael Menz, MD;  Location: [facility] ORS;  Service: Orthopedics;  Laterality: Left;  . Cataract extraction w/phaco Right 01/23/2016    Procedure: CATARACT EXTRACTION PHACO AND INTRAOCULAR LENS  PLACEMENT (IOC);  Surgeon: Galen ManilaWilliam Porfilio, MD;  Location: [facility] ORS;  Service: Ophthalmology;  Laterality: Right;  US 01:18 AP% 23.8 CDE 18.67 fluid pack lot # [id number]    History reviewed. No pertinent family history.  Social History Social History  Substance Use Topics  . Smoking status: Never Smoker   . Smokeless tobacco: Current User    Types: Chew  . Alcohol Use: No    Prior to Admission medications   Medication Sig Start Date End Date Taking? Authorizing Provider  canagliflozin (INVOKANA) 300 MG TABS tablet Take 300 mg by mouth daily before breakfast.    Historical Provider, MD  carvedilol (COREG) 12.5 MG tablet Take 12.5 mg by mouth 2 (two) times daily with a meal.    Historical Provider, MD  gemfibrozil (LOPID) 600 MG tablet Take 600 mg by mouth 2 (two) times daily before a meal.    Historical Provider, MD  glimepiride (AMARYL) 4 MG tablet Take 4 mg by mouth 2 (two) times daily.    Historical Provider, MD  isosorbide mononitrate (IMDUR) 30 MG 24 hr tablet Take 1 tablet (30 mg total) by mouth daily. 04/13/16   Lamar BlinksBruce J Kowalski, MD  losartan-hydrochlorothiazide (HYZAAR) 100-25 MG tablet Take 1 tablet by mouth daily.    Historical Provider, MD  metFORMIN (GLUCOPHAGE-XR) 750 MG 24 hr tablet Take 750 mg by mouth daily with breakfast.    Historical Provider, MD  nitroGLYCERIN (NITROSTAT) 0.4 MG SL tablet Place 0.4 mg under the tongue as needed. 04/07/16   Historical Provider, MD    Allergies  Allergen Reactions  . Aspirin Anaphylaxis  . Inderal [Propranolol]     Other reaction(s): Other (See Comments) Fatigue  . Lovastatin     Other reaction(s): Muscle Pain  . Metformin Hcl Diarrhea  . Penicillin G Hives  . Pravastatin Sodium     Other reaction(s): Muscle Pain  . Simvastatin     Other reaction(s): Muscle Pain  . Penicillins Rash    Has patient had a PCN reaction causing immediate rash, facial/tongue/throat swelling, SOB or lightheadedness with hypotension: Yes Has  patient had a PCN reaction causing severe rash involving mucus membranes or skin necrosis: Yes Has patient had a PCN reaction that required hospitalization No Has patient had a PCN reaction occurring within the last 10 years: Yes If all of the above answers are "NO", then may proceed with Cephalosporin use.     Review of Systems:  General:  normal appetite, normal energy   Respiratory:  + chronic dry cough that seems to be improving since he recently stopped taking lisinopril, no wheezing, no hemoptysis, no pain with inspiration or cough, no shortness of breath   Cardiac:   + chest pain or tightness, + exertional SOB, no resting SOB, no PND, no orthopnea, + mild LE edema, no palpitations, no syncope  GI:   no difficulty swallowing, no hematochezia, no hematemesis, no melena, no constipation, + some diarrhea   GU:   no dysuria, no urgency, no frequency   Musculoskeletal: + arthritis in both knees, no arthralgia   Vascular:  no pain suggestive of claudication   Neuro:   no symptoms suggestive of TIA's, no seizures, no headaches, no peripheral neuropathy   Endocrine:  Checks CBG's at home, does not take insulin  HEENT:  Edentulous w/ dentures,  no recent vision changes  Psych:   no anxiety, no depression    Physical Exam:   BP 113/67 mmHg  Pulse 80  Resp 20  Ht 6\' 4"  (1.93 m)  Wt 278 lb (126.1 kg)  BMI 33.85 kg/m2  SpO2 98%  General:  Obese,  well-appearing  HEENT:  Unremarkable   Neck:   no JVD, no bruits, no adenopathy   Chest:   clear to auscultation, symmetrical breath sounds, no wheezes, no rhonchi   CV:   RRR, no murmur   Abdomen:  soft, non-tender, no masses   Extremities:  warm, well-perfused, pulses diminished  Rectal/GU  Deferred  Neuro:   Grossly non-focal and symmetrical throughout  Skin:   Clean and dry, no rashes, no breakdown  Diagnostic Tests:  CARDIAC CATHETERIZATION Procedures    Left Heart Cath and Coronary Angiography    Conclusion     LM lesion,  99% stenosed.  Ost Cx lesion, 99% stenosed.  Prox LAD lesion, 99% stenosed.  Ost 1st Diag lesion, 55% stenosed.  Mid LAD lesion, 100% stenosed.  Prox RCA lesion, 75% stenosed.  Mid RCA lesion, 45% stenosed.  Dist RCA lesion, 50% stenosed.  RPDA lesion, 45% stenosed.  Assessment The patient has had progressive canadian class 4 anginal symptoms with risk factors including diabetes, high blood pressure and high cholesterol.  abnormal left ventricular function with ejection fraction of 45% with hypokinesis of anterior wall  severe 3 vessel coronary artery disease   There is significant stenosis of left main, lcx and lad with heavy calcium  Plan Continue medical management of CAD risk factors, Consider consultation for [name] and Additional medications for management of angina     Technique and Indications    Procedural details: The right groin was prepped, draped, and anesthetized with 1% lidocaine. Using modified Seldinger technique, a 5 French sheath was introduced into the right femoral artery. Standard Judkins catheters were used for coronary angiography and left ventriculography. Catheter exchanges were performed over a guidewire. There were no immediate procedural complications. The patient was transferred to the post catheterization recovery area for further monitoring.  During this procedure the patient is administered a total of Versed 1 mg and Fentanyl 25 mg to achieve and maintain moderate conscious sedation. The patient's heart rate, blood pressure, and oxygen saturation are monitored continuously during the procedure. The period of conscious sedation is 12 minutes, of which I was present face-to-face 100% of this time.  Estimated blood loss <50 mL. There were no immediate complications during the procedure.    Coronary Findings    Dominance: Co-dominant   Left Main   . LM lesion, 99% stenosed.     Left Anterior Descending   . Prox LAD lesion, 99% stenosed.     . Mid LAD lesion, 100% stenosed.   . First Diagonal Branch   . Ost 1st Diag lesion, 55% stenosed.     Ramus Intermedius  . Vessel is small.     Left Circumflex  . Vessel is small.   Suezanne Jacquet Cx lesion, 99% stenosed.   . First Obtuse Marginal Branch   The vessel is small in size.   Marland Kitchen Second Obtuse Marginal Branch   The vessel is moderate in size.   Marland Kitchen  Third Obtuse Marginal Branch   The vessel is small in size.     Right Coronary Artery   . Prox RCA lesion, 75% stenosed.   . Mid RCA lesion, 45% stenosed.   . Dist RCA lesion, 50% stenosed.   . Right Posterior Descending Artery   . RPDA lesion, 45% stenosed.      Wall Motion                 Coronary Diagrams    Diagnostic Diagram              Impression:  Patient has left main with critical three-vessel coronary artery disease and moderate left ventricular systolic dysfunction. He presents with a 3 to 4 week history of accelerating symptoms of chest pain consistent with unstable angina pectoris. I have personally reviewed the patient's diagnostic catheterization performed earlier today.  I agree the patient needs urgent surgical revascularization.   Plan:  I have reviewed the indications, risks, and potential benefits of coronary artery bypass grafting with the patient.  Alternative treatment strategies have been discussed, including the relative risks, benefits and long term prognosis associated with medical therapy, percutaneous coronary intervention, and surgical revascularization.  The patient understands and accepts all potential associated risks of surgery including but not limited to risk of death, stroke or other neurologic complication, myocardial infarction, congestive heart failure, respiratory failure, renal failure, bleeding requiring blood transfusion and/or reexploration, aortic dissection or other major vascular complication, arrhythmia, heart block or bradycardia requiring permanent pacemaker,  pneumonia, pleural effusion, wound infection, pulmonary embolus or other thromboembolic complication, chronic pain or other delayed complications related to median sternotomy, or the late recurrence of symptomatic ischemic heart disease and/or congestive heart failure.  The importance of long term risk modification have been emphasized.  All questions answered.  We plan to proceed with surgery first thing tomorrow or sooner should unstable symptoms develop.   I spent in excess of 120 minutes during the conduct of this office consultation and >50% of this time involved direct face-to-face encounter with the patient for counseling and/or coordination of their care.    Salvatore Decent. Cornelius Moras, MD

## 2016-04-13 NOTE — Progress Notes (Signed)
[facility] called, gave report to Casimiro NeedleMichael. Will arrive for patient transport in approx 15 minutes.

## 2016-04-13 NOTE — Progress Notes (Signed)
Report called to Angelica on [facility]. [facility] present and [facility].

## 2016-04-13 NOTE — Progress Notes (Signed)
ANTICOAGULATION CONSULT NOTE - Initial Consult  Pharmacy Consult for Heparin Indication: chest pain/ACS  Allergies  Allergen Reactions  . Aspirin Anaphylaxis  . Inderal [Propranolol]     Other reaction(s): Other (See Comments) Fatigue  . Lovastatin     Other reaction(s): Muscle Pain  . Metformin Hcl Diarrhea  . Penicillin G Hives  . Pravastatin Sodium     Other reaction(s): Muscle Pain  . Simvastatin     Other reaction(s): Muscle Pain  . Penicillins Rash    Has patient had a PCN reaction causing immediate rash, facial/tongue/throat swelling, SOB or lightheadedness with hypotension: Yes Has patient had a PCN reaction causing severe rash involving mucus membranes or skin necrosis: Yes Has patient had a PCN reaction that required hospitalization No Has patient had a PCN reaction occurring within the last 10 years: Yes If all of the above answers are "NO", then may proceed with Cephalosporin use.     Patient Measurements:   Heparin Dosing Weight: 114kg  Vital Signs: Temp: 98.1 F (36.7 C) (06/19 1103) Temp Source: Oral (06/19 1103) BP: 124/80 mmHg (06/19 1730) Pulse Rate: 84 (06/19 1730)  Labs: No results for input(s): HGB, HCT, PLT, APTT, LABPROT, INR, HEPARINUNFRC, HEPRLOWMOCWT, CREATININE, CKTOTAL, CKMB, TROPONINI in the last 72 hours.  CrCl cannot be calculated (Patient has no serum creatinine result on file.).   Medical History: Past Medical History  Diagnosis Date  . Hypertension   . Diabetes mellitus without complication (HCC)   . GERD (gastroesophageal reflux disease)   . Arthritis   . Cough     CHRONIC  . Edema     FEET/LEGS  . 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 Unstable angina (HCC) 04/13/2016  . Coronary artery disease involving native coronary artery with unstable angina pectoris (HCC) 04/13/2016    Assessment: [name] with ACS, s[facility] cath [facility], with severe 3 vessel CAD, plan for [name] tomorrow. Pharmacy is consulted to start IV heparin.  Sheath removal time = 1330, sheath site good, no complications per RN. Baseline CBC, BMET, aPTT, PT/INR are pending.    Goal of Therapy:  Heparin level 0.3-0.7 units/ml Monitor platelets by anticoagulation protocol: Yes   Plan:  - Start IV heprin 1400 units/hr at 2100 - heparin level at 0300 - D/C heparin on call to OR - heart pack   Bayard HuggerMei Krystol Rocco, PharmD, BCPS  Clinical Pharmacist  Pager: [phone]   04/13/2016,5:58 PM

## 2016-04-13 NOTE — Anesthesia Preprocedure Evaluation (Addendum)
Anesthesia Evaluation  Patient identified by MRN, date of birth, ID band Patient awake    Reviewed: Allergy & Precautions, NPO status , Patient's Chart, lab work & pertinent test results  Airway Mallampati: II  TM Distance: >3 FB Neck ROM: Full    Dental  (+) Edentulous Upper   Pulmonary neg pulmonary ROS,    breath sounds clear to auscultation       Cardiovascular hypertension, + angina + CAD   Rhythm:Regular Rate:Normal     Neuro/Psych negative neurological ROS     GI/Hepatic GERD  ,  Endo/Other  diabetesMorbid obesity  Renal/GU      Musculoskeletal  (+) Arthritis ,   Abdominal (+) + obese,   Peds  Hematology   Anesthesia Other Findings   Reproductive/Obstetrics                            Anesthesia Physical Anesthesia Plan  ASA: III  Anesthesia Plan: General   Post-op Pain Management:    Induction: Intravenous  Airway Management Planned: Oral ETT  Additional Equipment: TEE, PA Cath and Arterial line  Intra-op Plan:   Post-operative Plan: Post-operative intubation/ventilation  Informed Consent: I have reviewed the patients History and Physical, chart, labs and discussed the procedure including the risks, benefits and alternatives for the proposed anesthesia with the patient or authorized representative who has indicated his/her understanding and acceptance.   Dental advisory given  Plan Discussed with: CRNA and Surgeon  Anesthesia Plan Comments:         Anesthesia Quick Evaluation

## 2016-04-14 ENCOUNTER — Encounter (HOSPITAL_COMMUNITY)
Admission: AD | Disposition: A | Discharge status: Home Health | Payer: Self-pay | Source: Other Acute Inpatient Hospital | Attending: Thoracic Surgery (Cardiothoracic Vascular Surgery)

## 2016-04-14 ENCOUNTER — Inpatient Hospital Stay (HOSPITAL_COMMUNITY): Payer: Medicare Other

## 2016-04-14 ENCOUNTER — Inpatient Hospital Stay (HOSPITAL_COMMUNITY): Payer: Medicare Other | Admitting: Anesthesiology

## 2016-04-14 ENCOUNTER — Inpatient Hospital Stay (HOSPITAL_COMMUNITY): Admission: RE | Admit: 2016-04-14 | Payer: Medicare Other | Source: Ambulatory Visit | Admitting: Surgery

## 2016-04-14 ENCOUNTER — Other Ambulatory Visit: Payer: Self-pay

## 2016-04-14 ENCOUNTER — Encounter: Payer: Self-pay | Admitting: Internal Medicine

## 2016-04-14 DIAGNOSIS — Z951 Presence of aortocoronary bypass graft: Secondary | ICD-10-CM

## 2016-04-14 DIAGNOSIS — Z01810 Encounter for preprocedural cardiovascular examination: Secondary | ICD-10-CM

## 2016-04-14 HISTORY — DX: Presence of aortocoronary bypass graft: Z95.1

## 2016-04-14 HISTORY — PX: CORONARY ARTERY BYPASS GRAFT: SHX141

## 2016-04-14 HISTORY — PX: INTRAOPERATIVE TRANSESOPHAGEAL ECHOCARDIOGRAM: SHX5062

## 2016-04-14 LAB — POCT I-STAT 3, ART BLOOD GAS (G3+)
ACID-BASE DEFICIT: 1 mmol/L (ref 0.0–2.0)
ACID-BASE EXCESS: 1 mmol/L (ref 0.0–2.0)
Acid-Base Excess: 2 mmol/L (ref 0.0–2.0)
Acid-Base Excess: 2 mmol/L (ref 0.0–2.0)
BICARBONATE: 25.4 meq/L — AB (ref 20.0–24.0)
Bicarbonate: 27.8 mEq/L — ABNORMAL HIGH (ref 20.0–24.0)
Bicarbonate: 25.9 mEq/L — ABNORMAL HIGH (ref 20.0–24.0)
Bicarbonate: 26.7 mEq/L — ABNORMAL HIGH (ref 20.0–24.0)
Bicarbonate: 26.1 mEq/L — ABNORMAL HIGH (ref 20.0–24.0)
Bicarbonate: 25 mEq/L — ABNORMAL HIGH (ref 20.0–24.0)
O2 SAT: 100 %
O2 SAT: 98 %
O2 SAT: 91 %
O2 Saturation: 100 %
O2 Saturation: 98 %
O2 Saturation: 94 %
PCO2 ART: 46.4 mmHg — AB (ref 35.0–45.0)
PCO2 ART: 39 mmHg (ref 35.0–45.0)
PH ART: 7.348 — AB (ref 7.350–7.450)
PH ART: 7.444 (ref 7.350–7.450)
PO2 ART: 280 mmHg — AB (ref 80.0–100.0)
PO2 ART: 97 mmHg (ref 80.0–100.0)
PO2 ART: 98 mmHg (ref 80.0–100.0)
Patient temperature: 36.6
TCO2: 29 mmol/L (ref 0–100)
TCO2: 27 mmol/L (ref 0–100)
TCO2: 28 mmol/L (ref 0–100)
TCO2: 27 mmol/L (ref 0–100)
TCO2: 27 mmol/L (ref 0–100)
TCO2: 26 mmol/L (ref 0–100)
pCO2 arterial: 47.2 mmHg — ABNORMAL HIGH (ref 35.0–45.0)
pCO2 arterial: 45.1 mmHg — ABNORMAL HIGH (ref 35.0–45.0)
pCO2 arterial: 40.6 mmHg (ref 35.0–45.0)
pCO2 arterial: 47.5 mmHg — ABNORMAL HIGH (ref 35.0–45.0)
pH, Arterial: 7.385 (ref 7.350–7.450)
pH, Arterial: 7.369 (ref 7.350–7.450)
pH, Arterial: 7.403 (ref 7.350–7.450)
pH, Arterial: 7.328 — ABNORMAL LOW (ref 7.350–7.450)
pO2, Arterial: 422 mmHg — ABNORMAL HIGH (ref 80.0–100.0)
pO2, Arterial: 71 mmHg — ABNORMAL LOW (ref 80.0–100.0)
pO2, Arterial: 66 mmHg — ABNORMAL LOW (ref 80.0–100.0)

## 2016-04-14 LAB — POCT I-STAT, CHEM 8
BUN: 15 mg/dL (ref 6–20)
BUN: 16 mg/dL (ref 6–20)
BUN: 13 mg/dL (ref 6–20)
BUN: 14 mg/dL (ref 6–20)
BUN: 14 mg/dL (ref 6–20)
BUN: 12 mg/dL (ref 6–20)
CALCIUM ION: 1.3 mmol/L (ref 1.13–1.30)
CALCIUM ION: 1.03 mmol/L — AB (ref 1.13–1.30)
CALCIUM ION: 1.1 mmol/L — AB (ref 1.13–1.30)
CALCIUM ION: 1.2 mmol/L (ref 1.13–1.30)
CHLORIDE: 97 mmol/L — AB (ref 101–111)
CHLORIDE: 96 mmol/L — AB (ref 101–111)
CHLORIDE: 97 mmol/L — AB (ref 101–111)
CHLORIDE: 96 mmol/L — AB (ref 101–111)
CHLORIDE: 101 mmol/L (ref 101–111)
CREATININE: 0.7 mg/dL (ref 0.61–1.24)
CREATININE: 0.8 mg/dL (ref 0.61–1.24)
Calcium, Ion: 1.3 mmol/L (ref 1.13–1.30)
Calcium, Ion: 1.09 mmol/L — ABNORMAL LOW (ref 1.13–1.30)
Chloride: 98 mmol/L — ABNORMAL LOW (ref 101–111)
Creatinine, Ser: 0.6 mg/dL — ABNORMAL LOW (ref 0.61–1.24)
Creatinine, Ser: 0.7 mg/dL (ref 0.61–1.24)
Creatinine, Ser: 0.9 mg/dL (ref 0.61–1.24)
Creatinine, Ser: 0.8 mg/dL (ref 0.61–1.24)
GLUCOSE: 157 mg/dL — AB (ref 65–99)
GLUCOSE: 160 mg/dL — AB (ref 65–99)
GLUCOSE: 135 mg/dL — AB (ref 65–99)
GLUCOSE: 193 mg/dL — AB (ref 65–99)
GLUCOSE: 108 mg/dL — AB (ref 65–99)
Glucose, Bld: 179 mg/dL — ABNORMAL HIGH (ref 65–99)
HCT: 37 % — ABNORMAL LOW (ref 39.0–52.0)
HCT: 38 % — ABNORMAL LOW (ref 39.0–52.0)
HCT: 30 % — ABNORMAL LOW (ref 39.0–52.0)
HCT: 32 % — ABNORMAL LOW (ref 39.0–52.0)
HCT: 29 % — ABNORMAL LOW (ref 39.0–52.0)
HEMATOCRIT: 26 % — AB (ref 39.0–52.0)
HEMOGLOBIN: 12.6 g/dL — AB (ref 13.0–17.0)
HEMOGLOBIN: 9.9 g/dL — AB (ref 13.0–17.0)
Hemoglobin: 12.9 g/dL — ABNORMAL LOW (ref 13.0–17.0)
Hemoglobin: 10.2 g/dL — ABNORMAL LOW (ref 13.0–17.0)
Hemoglobin: 10.9 g/dL — ABNORMAL LOW (ref 13.0–17.0)
Hemoglobin: 8.8 g/dL — ABNORMAL LOW (ref 13.0–17.0)
POTASSIUM: 4 mmol/L (ref 3.5–5.1)
POTASSIUM: 4.1 mmol/L (ref 3.5–5.1)
Potassium: 3.9 mmol/L (ref 3.5–5.1)
Potassium: 4.2 mmol/L (ref 3.5–5.1)
Potassium: 4.5 mmol/L (ref 3.5–5.1)
Potassium: 4.2 mmol/L (ref 3.5–5.1)
SODIUM: 135 mmol/L (ref 135–145)
Sodium: 136 mmol/L (ref 135–145)
Sodium: 136 mmol/L (ref 135–145)
Sodium: 135 mmol/L (ref 135–145)
Sodium: 137 mmol/L (ref 135–145)
Sodium: 138 mmol/L (ref 135–145)
TCO2: 30 mmol/L (ref 0–100)
TCO2: 30 mmol/L (ref 0–100)
TCO2: 26 mmol/L (ref 0–100)
TCO2: 28 mmol/L (ref 0–100)
TCO2: 26 mmol/L (ref 0–100)
TCO2: 26 mmol/L (ref 0–100)

## 2016-04-14 LAB — CBC
HCT: 35 % — ABNORMAL LOW (ref 39.0–52.0)
HEMATOCRIT: 41 % (ref 39.0–52.0)
HEMATOCRIT: 31.7 % — AB (ref 39.0–52.0)
Hemoglobin: 13.5 g/dL (ref 13.0–17.0)
Hemoglobin: 11.5 g/dL — ABNORMAL LOW (ref 13.0–17.0)
Hemoglobin: 10.7 g/dL — ABNORMAL LOW (ref 13.0–17.0)
MCH: 29.7 pg (ref 26.0–34.0)
MCH: 29.2 pg (ref 26.0–34.0)
MCH: 30.2 pg (ref 26.0–34.0)
MCHC: 32.9 g/dL (ref 30.0–36.0)
MCHC: 32.9 g/dL (ref 30.0–36.0)
MCHC: 33.8 g/dL (ref 30.0–36.0)
MCV: 90.3 fL (ref 78.0–100.0)
MCV: 88.8 fL (ref 78.0–100.0)
MCV: 89.5 fL (ref 78.0–100.0)
PLATELETS: 144 10*3/uL — AB (ref 150–400)
PLATELETS: 186 10*3/uL (ref 150–400)
Platelets: 191 10*3/uL (ref 150–400)
RBC: 4.54 MIL/uL (ref 4.22–5.81)
RBC: 3.94 MIL/uL — ABNORMAL LOW (ref 4.22–5.81)
RBC: 3.54 MIL/uL — AB (ref 4.22–5.81)
RDW: 12.9 % (ref 11.5–15.5)
RDW: 12.6 % (ref 11.5–15.5)
RDW: 12.8 % (ref 11.5–15.5)
WBC: 6.9 10*3/uL (ref 4.0–10.5)
WBC: 19.3 10*3/uL — AB (ref 4.0–10.5)
WBC: 18.6 10*3/uL — AB (ref 4.0–10.5)

## 2016-04-14 LAB — POCT I-STAT 4, (NA,K, GLUC, HGB,HCT)
GLUCOSE: 156 mg/dL — AB (ref 65–99)
HCT: 33 % — ABNORMAL LOW (ref 39.0–52.0)
Hemoglobin: 11.2 g/dL — ABNORMAL LOW (ref 13.0–17.0)
POTASSIUM: 3.9 mmol/L (ref 3.5–5.1)
Sodium: 138 mmol/L (ref 135–145)

## 2016-04-14 LAB — GLUCOSE, CAPILLARY
GLUCOSE-CAPILLARY: 205 mg/dL — AB (ref 65–99)
GLUCOSE-CAPILLARY: 145 mg/dL — AB (ref 65–99)
Glucose-Capillary: 158 mg/dL — ABNORMAL HIGH (ref 65–99)
Glucose-Capillary: 198 mg/dL — ABNORMAL HIGH (ref 65–99)

## 2016-04-14 LAB — HEMOGLOBIN A1C
Hgb A1c MFr Bld: 7.6 % — ABNORMAL HIGH (ref 4.8–5.6)
MEAN PLASMA GLUCOSE: 171 mg/dL

## 2016-04-14 LAB — BASIC METABOLIC PANEL
Anion gap: 8 (ref 5–15)
BUN: 14 mg/dL (ref 6–20)
CALCIUM: 9.5 mg/dL (ref 8.9–10.3)
CO2: 26 mmol/L (ref 22–32)
CREATININE: 1.03 mg/dL (ref 0.61–1.24)
Chloride: 98 mmol/L — ABNORMAL LOW (ref 101–111)
GFR calc Af Amer: >60 mL/min (ref >60)
GLUCOSE: 176 mg/dL — AB (ref 65–99)
Potassium: 3.7 mmol/L (ref 3.5–5.1)
Sodium: 132 mmol/L — ABNORMAL LOW (ref 135–145)

## 2016-04-14 LAB — MAGNESIUM: MAGNESIUM: 2.6 mg/dL — AB (ref 1.7–2.4)

## 2016-04-14 LAB — CREATININE, SERUM
Creatinine, Ser: 0.8 mg/dL (ref 0.61–1.24)
GFR calc Af Amer: >60 mL/min (ref >60)
GFR calc non Af Amer: >60 mL/min (ref >60)

## 2016-04-14 LAB — MRSA PCR SCREENING: MRSA BY PCR: NEGATIVE

## 2016-04-14 LAB — PROTIME-INR
INR: 1.48 (ref 0.00–1.49)
Prothrombin Time: 18 seconds — ABNORMAL HIGH (ref 11.6–15.2)

## 2016-04-14 LAB — HEMOGLOBIN AND HEMATOCRIT, BLOOD
HCT: 30.5 % — ABNORMAL LOW (ref 39.0–52.0)
Hemoglobin: 10.5 g/dL — ABNORMAL LOW (ref 13.0–17.0)

## 2016-04-14 LAB — APTT: APTT: 27 s (ref 24–37)

## 2016-04-14 LAB — TROPONIN I
TROPONIN I: 0.04 ng/mL — AB (ref <0.031)
TROPONIN I: 0.05 ng/mL — AB (ref <0.031)

## 2016-04-14 LAB — PLATELET COUNT: PLATELETS: 166 10*3/uL (ref 150–400)

## 2016-04-14 SURGERY — CORONARY ARTERY BYPASS GRAFTING (CABG)
Anesthesia: General | Site: Chest

## 2016-04-14 MED ORDER — ACETAMINOPHEN 650 MG RE SUPP
650.0000 mg | Freq: Once | RECTAL | Status: AC
  Administered 2016-04-14: 650 mg via RECTAL

## 2016-04-14 MED ORDER — SODIUM CHLORIDE 0.9% FLUSH: 3.0000 mL | INTRAVENOUS | Status: DC | PRN

## 2016-04-14 MED ORDER — SODIUM CHLORIDE 0.45 % IV SOLN: INTRAVENOUS | Status: DC | PRN

## 2016-04-14 MED ORDER — ROCURONIUM BROMIDE 100 MG/10ML IV SOLN
INTRAVENOUS | Status: DC | PRN
  Administered 2016-04-14: 30 mg via INTRAVENOUS
  Administered 2016-04-14: 70 mg via INTRAVENOUS

## 2016-04-14 MED ORDER — ANTISEPTIC ORAL RINSE SOLUTION (CORINZ)
7.0000 mL | OROMUCOSAL | Status: DC
  Administered 2016-04-14 – 2016-04-15 (×3): 7 mL via OROMUCOSAL

## 2016-04-14 MED ORDER — PHENYLEPHRINE HCL 10 MG/ML IJ SOLN
INTRAMUSCULAR | Status: DC | PRN
  Administered 2016-04-14: 120 ug via INTRAVENOUS
  Administered 2016-04-14: 80 ug via INTRAVENOUS
  Administered 2016-04-14: 40 ug via INTRAVENOUS
  Administered 2016-04-14: 120 ug via INTRAVENOUS

## 2016-04-14 MED ORDER — CHLORHEXIDINE GLUCONATE 0.12 % MT SOLN
15.0000 mL | OROMUCOSAL | Status: AC
  Administered 2016-04-14: 15 mL via OROMUCOSAL

## 2016-04-14 MED ORDER — ARTIFICIAL TEARS OP OINT
TOPICAL_OINTMENT | OPHTHALMIC | Status: DC | PRN
  Administered 2016-04-14: 1 via OPHTHALMIC

## 2016-04-14 MED ORDER — METOPROLOL TARTRATE 25 MG/10 ML ORAL SUSPENSION: 12.5000 mg | Freq: Two times a day (BID) | ORAL | Status: DC

## 2016-04-14 MED ORDER — NITROGLYCERIN IN D5W 200-5 MCG/ML-% IV SOLN: 0.0000 ug/min | INTRAVENOUS | Status: DC

## 2016-04-14 MED ORDER — VANCOMYCIN HCL IN DEXTROSE 1-5 GM/200ML-% IV SOLN
1000.0000 mg | Freq: Once | INTRAVENOUS | Status: DC
  Filled 2016-04-14: qty 200

## 2016-04-14 MED ORDER — TRAMADOL HCL 50 MG PO TABS
50.0000 mg | ORAL_TABLET | ORAL | Status: DC | PRN
  Administered 2016-05-03 – 2016-05-06 (×3): 100 mg via ORAL
  Filled 2016-04-14 (×3): qty 2

## 2016-04-14 MED ORDER — POTASSIUM CHLORIDE 10 MEQ/50ML IV SOLN
10.0000 meq | INTRAVENOUS | Status: AC
  Administered 2016-04-14 (×3): 10 meq via INTRAVENOUS

## 2016-04-14 MED ORDER — FAMOTIDINE IN NACL 20-0.9 MG/50ML-% IV SOLN: 20.0000 mg | Freq: Two times a day (BID) | INTRAVENOUS | Status: DC

## 2016-04-14 MED ORDER — MORPHINE SULFATE (PF) 2 MG/ML IV SOLN: 1.0000 mg | INTRAVENOUS | Status: DC | PRN

## 2016-04-14 MED ORDER — LACTATED RINGERS IV SOLN: 500.0000 mL | Freq: Once | INTRAVENOUS | Status: DC | PRN

## 2016-04-14 MED ORDER — CHLORHEXIDINE GLUCONATE 0.12% ORAL RINSE (MEDLINE KIT)
15.0000 mL | Freq: Two times a day (BID) | OROMUCOSAL | Status: DC
  Administered 2016-04-14: 15 mL via OROMUCOSAL

## 2016-04-14 MED ORDER — SODIUM CHLORIDE 0.9 % IV SOLN
INTRAVENOUS | Status: DC
  Filled 2016-04-14: qty 2.5

## 2016-04-14 MED ORDER — CETYLPYRIDINIUM CHLORIDE 0.05 % MT LIQD
7.0000 mL | Freq: Two times a day (BID) | OROMUCOSAL | Status: DC
  Administered 2016-04-15: 7 mL via OROMUCOSAL

## 2016-04-14 MED ORDER — METOPROLOL TARTRATE 5 MG/5ML IV SOLN
2.5000 mg | INTRAVENOUS | Status: DC | PRN
Start: 2016-04-14 — End: 2016-05-06
  Administered 2016-04-15 – 2016-04-21 (×2): 2.5 mg via INTRAVENOUS
  Administered 2016-04-21: 5 mg via INTRAVENOUS
  Administered 2016-04-21: 2.5 mg via INTRAVENOUS
  Filled 2016-04-14 (×6): qty 5

## 2016-04-14 MED ORDER — FENTANYL CITRATE (PF) 250 MCG/5ML IJ SOLN
INTRAMUSCULAR | Status: DC | PRN
  Administered 2016-04-14 (×2): 100 ug via INTRAVENOUS
  Administered 2016-04-14: 250 ug via INTRAVENOUS
  Administered 2016-04-14: 150 ug via INTRAVENOUS
  Administered 2016-04-14: 250 ug via INTRAVENOUS
  Administered 2016-04-14 (×3): 100 ug via INTRAVENOUS
  Administered 2016-04-14: 250 ug via INTRAVENOUS
  Administered 2016-04-14: 100 ug via INTRAVENOUS
  Administered 2016-04-14: 50 ug via INTRAVENOUS
  Administered 2016-04-14: 200 ug via INTRAVENOUS
  Administered 2016-04-14 (×2): 100 ug via INTRAVENOUS
  Administered 2016-04-14: 50 ug via INTRAVENOUS

## 2016-04-14 MED ORDER — DEXMEDETOMIDINE HCL IN NACL 200 MCG/50ML IV SOLN
0.0000 ug/kg/h | INTRAVENOUS | Status: DC
  Filled 2016-04-14: qty 50

## 2016-04-14 MED ORDER — ALBUMIN HUMAN 5 % IV SOLN
250.0000 mL | INTRAVENOUS | Status: AC | PRN
  Administered 2016-04-14 (×3): 250 mL via INTRAVENOUS

## 2016-04-14 MED ORDER — SODIUM CHLORIDE 0.9 % IV SOLN
250.0000 mL | INTRAVENOUS | Status: DC
  Administered 2016-04-19: 250 mL via INTRAVENOUS

## 2016-04-14 MED ORDER — BISACODYL 10 MG RE SUPP: 10.0000 mg | Freq: Every day | RECTAL | Status: DC

## 2016-04-14 MED ORDER — CLOPIDOGREL BISULFATE 75 MG PO TABS
75.0000 mg | ORAL_TABLET | Freq: Every day | ORAL | Status: DC
  Administered 2016-04-15 – 2016-04-18 (×4): 75 mg via ORAL
  Filled 2016-04-14 (×5): qty 1

## 2016-04-14 MED ORDER — LIDOCAINE HCL (CARDIAC) 20 MG/ML IV SOLN
INTRAVENOUS | Status: DC | PRN
  Administered 2016-04-14: 80 mg via INTRAVENOUS

## 2016-04-14 MED ORDER — BISACODYL 5 MG PO TBEC
10.0000 mg | DELAYED_RELEASE_TABLET | Freq: Every day | ORAL | Status: DC
  Administered 2016-04-15 – 2016-04-18 (×3): 10 mg via ORAL
  Filled 2016-04-14 (×9): qty 2

## 2016-04-14 MED ORDER — PANTOPRAZOLE SODIUM 40 MG PO TBEC
40.0000 mg | DELAYED_RELEASE_TABLET | Freq: Every day | ORAL | Status: DC
  Administered 2016-04-16 – 2016-04-18 (×3): 40 mg via ORAL
  Filled 2016-04-14 (×4): qty 1

## 2016-04-14 MED ORDER — ACETAMINOPHEN 160 MG/5ML PO SOLN: 1000.0000 mg | Freq: Four times a day (QID) | ORAL | Status: DC

## 2016-04-14 MED ORDER — ONDANSETRON HCL 4 MG/2ML IJ SOLN
4.0000 mg | Freq: Four times a day (QID) | INTRAMUSCULAR | Status: DC | PRN
  Administered 2016-04-15 – 2016-04-20 (×8): 4 mg via INTRAVENOUS
  Filled 2016-04-14 (×8): qty 2

## 2016-04-14 MED ORDER — SODIUM CHLORIDE 0.9 % IV SOLN: INTRAVENOUS | Status: DC

## 2016-04-14 MED ORDER — MIDAZOLAM HCL 5 MG/ML IJ SOLN
INTRAMUSCULAR | Status: DC | PRN
  Administered 2016-04-14: 2 mg via INTRAVENOUS
  Administered 2016-04-14: 3 mg via INTRAVENOUS
  Administered 2016-04-14 (×5): 2 mg via INTRAVENOUS
  Administered 2016-04-14: 3 mg via INTRAVENOUS
  Administered 2016-04-14: 2 mg via INTRAVENOUS

## 2016-04-14 MED ORDER — SODIUM CHLORIDE 0.9 % IV SOLN
INTRAVENOUS | Status: DC | PRN
  Administered 2016-04-14: 15:00:00 via INTRAVENOUS

## 2016-04-14 MED ORDER — INSULIN REGULAR BOLUS VIA INFUSION
0.0000 [IU] | Freq: Three times a day (TID) | INTRAVENOUS | Status: DC
  Filled 2016-04-14: qty 10

## 2016-04-14 MED ORDER — 0.9 % SODIUM CHLORIDE (POUR BTL) OPTIME
TOPICAL | Status: DC | PRN
  Administered 2016-04-14: 5000 mL

## 2016-04-14 MED ORDER — LACTATED RINGERS IV SOLN: INTRAVENOUS | Status: DC

## 2016-04-14 MED ORDER — VECURONIUM BROMIDE 10 MG IV SOLR
INTRAVENOUS | Status: DC | PRN
  Administered 2016-04-14 (×4): 5 mg via INTRAVENOUS

## 2016-04-14 MED ORDER — HEPARIN SODIUM (PORCINE) 1000 UNIT/ML IJ SOLN
INTRAMUSCULAR | Status: DC | PRN
  Administered 2016-04-14: 30000 [IU] via INTRAVENOUS
  Administered 2016-04-14: 3000 [IU] via INTRAVENOUS

## 2016-04-14 MED ORDER — LACTATED RINGERS IV SOLN
INTRAVENOUS | Status: DC
  Administered 2016-04-14 (×2): via INTRAVENOUS

## 2016-04-14 MED ORDER — LEVOFLOXACIN IN D5W 750 MG/150ML IV SOLN
750.0000 mg | INTRAVENOUS | Status: AC
  Administered 2016-04-15: 750 mg via INTRAVENOUS
  Filled 2016-04-14: qty 150

## 2016-04-14 MED ORDER — DOCUSATE SODIUM 100 MG PO CAPS
200.0000 mg | ORAL_CAPSULE | Freq: Every day | ORAL | Status: DC
  Administered 2016-04-15 – 2016-04-18 (×3): 200 mg via ORAL
  Filled 2016-04-14 (×5): qty 2

## 2016-04-14 MED ORDER — PHENYLEPHRINE HCL 10 MG/ML IJ SOLN
0.0000 ug/min | INTRAMUSCULAR | Status: DC
  Filled 2016-04-14 (×2): qty 2

## 2016-04-14 MED ORDER — ALBUMIN HUMAN 5 % IV SOLN
INTRAVENOUS | Status: DC | PRN
  Administered 2016-04-14: 16:00:00 via INTRAVENOUS

## 2016-04-14 MED ORDER — PROTAMINE SULFATE 10 MG/ML IV SOLN
INTRAVENOUS | Status: DC | PRN
  Administered 2016-04-14: 320 mg via INTRAVENOUS
  Administered 2016-04-14: 10 mg via INTRAVENOUS

## 2016-04-14 MED ORDER — MORPHINE SULFATE (PF) 2 MG/ML IV SOLN
2.0000 mg | INTRAVENOUS | Status: DC | PRN
Start: 2016-04-14 — End: 2016-04-22
  Administered 2016-04-14 – 2016-04-20 (×10): 2 mg via INTRAVENOUS
  Administered 2016-04-20 (×2): 4 mg via INTRAVENOUS
  Administered 2016-04-20 – 2016-04-21 (×2): 2 mg via INTRAVENOUS
  Administered 2016-04-21: 4 mg via INTRAVENOUS
  Filled 2016-04-14 (×2): qty 1
  Filled 2016-04-14: qty 2
  Filled 2016-04-14 (×4): qty 1
  Filled 2016-04-14: qty 2
  Filled 2016-04-14 (×3): qty 1
  Filled 2016-04-14: qty 2
  Filled 2016-04-14 (×2): qty 1
  Filled 2016-04-14: qty 2

## 2016-04-14 MED ORDER — SODIUM CHLORIDE 0.9% FLUSH
3.0000 mL | Freq: Two times a day (BID) | INTRAVENOUS | Status: DC
  Administered 2016-04-15 – 2016-05-05 (×22): 3 mL via INTRAVENOUS

## 2016-04-14 MED ORDER — PROPOFOL 10 MG/ML IV BOLUS
INTRAVENOUS | Status: DC | PRN
Start: 2016-04-14 — End: 2016-04-14
  Administered 2016-04-14: 30 mg via INTRAVENOUS
  Administered 2016-04-14: 20 mg via INTRAVENOUS
  Administered 2016-04-14: 80 mg via INTRAVENOUS

## 2016-04-14 MED ORDER — HEMOSTATIC AGENTS (NO CHARGE) OPTIME
TOPICAL | Status: DC | PRN
  Administered 2016-04-14: 4 via TOPICAL

## 2016-04-14 MED ORDER — MIDAZOLAM HCL 2 MG/2ML IJ SOLN: 2.0000 mg | INTRAMUSCULAR | Status: DC | PRN

## 2016-04-14 MED ORDER — MAGNESIUM SULFATE 4 GM/100ML IV SOLN
4.0000 g | Freq: Once | INTRAVENOUS | Status: AC
  Administered 2016-04-14: 4 g via INTRAVENOUS
  Filled 2016-04-14: qty 100

## 2016-04-14 MED ORDER — OXYCODONE HCL 5 MG PO TABS
5.0000 mg | ORAL_TABLET | ORAL | Status: DC | PRN
  Administered 2016-04-15 (×2): 5 mg via ORAL
  Administered 2016-04-16 (×3): 10 mg via ORAL
  Administered 2016-04-18: 5 mg via ORAL
  Filled 2016-04-14: qty 2
  Filled 2016-04-14: qty 1
  Filled 2016-04-14 (×4): qty 2
  Filled 2016-04-14: qty 1

## 2016-04-14 MED ORDER — METOPROLOL TARTRATE 12.5 MG HALF TABLET: 12.5000 mg | ORAL_TABLET | Freq: Two times a day (BID) | ORAL | Status: DC

## 2016-04-14 MED ORDER — ACETAMINOPHEN 160 MG/5ML PO SOLN: 650.0000 mg | Freq: Once | ORAL | Status: AC

## 2016-04-14 MED ORDER — ACETAMINOPHEN 500 MG PO TABS
1000.0000 mg | ORAL_TABLET | Freq: Four times a day (QID) | ORAL | Status: AC
  Administered 2016-04-15 – 2016-04-19 (×14): 1000 mg via ORAL
  Filled 2016-04-14 (×14): qty 2

## 2016-04-14 MED FILL — Heparin Sodium (Porcine) Inj 1000 Unit/ML: INTRAMUSCULAR | Qty: 30 | Status: AC

## 2016-04-14 MED FILL — Magnesium Sulfate Inj 50%: INTRAMUSCULAR | Qty: 10 | Status: AC

## 2016-04-14 MED FILL — Potassium Chloride Inj 2 mEq/ML: INTRAVENOUS | Qty: 40 | Status: AC

## 2016-04-14 SURGICAL SUPPLY — 105 items
APPLIER CLIP 9.375 SM OPEN (CLIP) ×6
BAG DECANTER FOR FLEXI CONT (MISCELLANEOUS) ×4 IMPLANT
BANDAGE ACE 4X5 VEL STRL LF (GAUZE/BANDAGES/DRESSINGS) ×2 IMPLANT
BANDAGE ACE 6X5 VEL STRL LF (GAUZE/BANDAGES/DRESSINGS) ×2 IMPLANT
BANDAGE ELASTIC 4 VELCRO ST LF (GAUZE/BANDAGES/DRESSINGS) ×2 IMPLANT
BANDAGE ELASTIC 6 VELCRO ST LF (GAUZE/BANDAGES/DRESSINGS) ×2 IMPLANT
BASKET HEART (ORDER IN 25'S) (MISCELLANEOUS) ×1
BASKET HEART (ORDER IN 25S) (MISCELLANEOUS) ×1 IMPLANT
BENZOIN TINCTURE PRP APPL 2/3 (GAUZE/BANDAGES/DRESSINGS) ×2 IMPLANT
BLADE STERNUM SYSTEM 6 (BLADE) ×2 IMPLANT
BLADE SURG ROTATE 9660 (MISCELLANEOUS) IMPLANT
BNDG GAUZE ELAST 4 BULKY (GAUZE/BANDAGES/DRESSINGS) ×2 IMPLANT
CANISTER SUCTION 2500CC (MISCELLANEOUS) ×2 IMPLANT
CANNULA AORTIC ROOT 9FR (CANNULA) ×2 IMPLANT
CANNULA EZ GLIDE 8.0 24FR (CANNULA) ×2 IMPLANT
CANNULA EZ GLIDE AORTIC 21FR (CANNULA) ×4 IMPLANT
CATH CPB KIT OWEN (MISCELLANEOUS) ×2 IMPLANT
CATH THORACIC 36FR (CATHETERS) ×2 IMPLANT
CLIP APPLIE 9.375 SM OPEN (CLIP) ×3 IMPLANT
CLIP RETRACTION 3.0MM CORONARY (MISCELLANEOUS) ×2 IMPLANT
CLIP TI MEDIUM 24 (CLIP) IMPLANT
CLIP TI WIDE RED SMALL 24 (CLIP) IMPLANT
CONN ST 1/4X3/8  BEN (MISCELLANEOUS) ×1
CONN ST 1/4X3/8 BEN (MISCELLANEOUS) ×1 IMPLANT
CRADLE DONUT ADULT HEAD (MISCELLANEOUS) ×2 IMPLANT
DERMABOND ADVANCED (GAUZE/BANDAGES/DRESSINGS) ×1
DERMABOND ADVANCED .7 DNX12 (GAUZE/BANDAGES/DRESSINGS) ×1 IMPLANT
DRAIN CHANNEL 32F RND 10.7 FF (WOUND CARE) ×4 IMPLANT
DRAPE CARDIOVASCULAR INCISE (DRAPES) ×1
DRAPE INCISE IOBAN 66X45 STRL (DRAPES) ×2 IMPLANT
DRAPE SLUSH/WARMER DISC (DRAPES) ×2 IMPLANT
DRAPE SRG 135X102X78XABS (DRAPES) ×1 IMPLANT
DRSG AQUACEL AG ADV 3.5X14 (GAUZE/BANDAGES/DRESSINGS) ×2 IMPLANT
DRSG COVADERM 4X14 (GAUZE/BANDAGES/DRESSINGS) ×2 IMPLANT
ELECT REM PT RETURN 9FT ADLT (ELECTROSURGICAL) ×4
ELECTRODE REM PT RTRN 9FT ADLT (ELECTROSURGICAL) ×2 IMPLANT
FELT TEFLON 1X6 (MISCELLANEOUS) ×2 IMPLANT
GAUZE SPONGE 4X4 12PLY STRL (GAUZE/BANDAGES/DRESSINGS) ×4 IMPLANT
GLOVE BIO SURGEON STRL SZ 6.5 (GLOVE) ×2 IMPLANT
GLOVE BIO SURGEON STRL SZ7.5 (GLOVE) ×2 IMPLANT
GLOVE ORTHO TXT STRL SZ7.5 (GLOVE) ×10 IMPLANT
GOWN STRL REUS W/ TWL LRG LVL3 (GOWN DISPOSABLE) ×4 IMPLANT
GOWN STRL REUS W/TWL LRG LVL3 (GOWN DISPOSABLE) ×4
HEMOSTAT POWDER SURGIFOAM 1G (HEMOSTASIS) ×14 IMPLANT
INSERT FOGARTY XLG (MISCELLANEOUS) ×2 IMPLANT
KIT BASIN OR (CUSTOM PROCEDURE TRAY) ×2 IMPLANT
KIT ROOM TURNOVER OR (KITS) ×2 IMPLANT
KIT SUCTION CATH 14FR (SUCTIONS) ×8 IMPLANT
KIT VASOVIEW 6 PRO VH 2400 (KITS) ×2 IMPLANT
LEAD PACING MYOCARDI (MISCELLANEOUS) ×2 IMPLANT
MARKER GRAFT CORONARY BYPASS (MISCELLANEOUS) ×6 IMPLANT
NS IRRIG 1000ML POUR BTL (IV SOLUTION) ×10 IMPLANT
PACK OPEN HEART (CUSTOM PROCEDURE TRAY) ×2 IMPLANT
PAD ARMBOARD 7.5X6 YLW CONV (MISCELLANEOUS) ×4 IMPLANT
PAD ELECT DEFIB RADIOL ZOLL (MISCELLANEOUS) ×2 IMPLANT
PENCIL BUTTON HOLSTER BLD 10FT (ELECTRODE) ×2 IMPLANT
PUNCH AORTIC ROT 4.0MM RCL 40 (MISCELLANEOUS) ×2 IMPLANT
PUNCH AORTIC ROTATE 4.0MM (MISCELLANEOUS) IMPLANT
PUNCH AORTIC ROTATE 4.5MM 8IN (MISCELLANEOUS) IMPLANT
PUNCH AORTIC ROTATE 5MM 8IN (MISCELLANEOUS) IMPLANT
SET CARDIOPLEGIA MPS 5001102 (MISCELLANEOUS) ×2 IMPLANT
SOLUTION ANTI FOG 6CC (MISCELLANEOUS) ×2 IMPLANT
SPONGE GAUZE 4X4 12PLY STER LF (GAUZE/BANDAGES/DRESSINGS) ×4 IMPLANT
SPONGE LAP 18X18 X RAY DECT (DISPOSABLE) ×8 IMPLANT
SPONGE LAP 4X18 X RAY DECT (DISPOSABLE) ×2 IMPLANT
SUT BONE WAX W31G (SUTURE) ×2 IMPLANT
SUT ETHIBOND X763 2 0 SH 1 (SUTURE) ×4 IMPLANT
SUT MNCRL AB 3-0 PS2 18 (SUTURE) ×4 IMPLANT
SUT MNCRL AB 4-0 PS2 18 (SUTURE) IMPLANT
SUT PDS AB 1 CTX 36 (SUTURE) ×4 IMPLANT
SUT PROLENE 2 0 SH DA (SUTURE) IMPLANT
SUT PROLENE 3 0 SH DA (SUTURE) ×6 IMPLANT
SUT PROLENE 3 0 SH1 36 (SUTURE) ×10 IMPLANT
SUT PROLENE 4 0 RB 1 (SUTURE) ×1
SUT PROLENE 4 0 SH DA (SUTURE) ×2 IMPLANT
SUT PROLENE 4-0 RB1 .5 CRCL 36 (SUTURE) ×1 IMPLANT
SUT PROLENE 5 0 C 1 36 (SUTURE) IMPLANT
SUT PROLENE 6 0 C 1 30 (SUTURE) IMPLANT
SUT PROLENE 7.0 RB 3 (SUTURE) ×12 IMPLANT
SUT PROLENE 8 0 BV175 6 (SUTURE) IMPLANT
SUT PROLENE BLUE 7 0 (SUTURE) ×8 IMPLANT
SUT PROLENE POLY MONO (SUTURE) ×8 IMPLANT
SUT SILK  1 MH (SUTURE) ×1
SUT SILK 1 MH (SUTURE) ×1 IMPLANT
SUT STEEL 6MS V (SUTURE) IMPLANT
SUT STEEL STERNAL CCS#1 18IN (SUTURE) IMPLANT
SUT STEEL SZ 6 DBL 3X14 BALL (SUTURE) ×6 IMPLANT
SUT VIC AB 1 CTX 36 (SUTURE)
SUT VIC AB 1 CTX36XBRD ANBCTR (SUTURE) IMPLANT
SUT VIC AB 2-0 CT1 27 (SUTURE) ×1
SUT VIC AB 2-0 CT1 TAPERPNT 27 (SUTURE) ×1 IMPLANT
SUT VIC AB 2-0 CTX 27 (SUTURE) IMPLANT
SUT VIC AB 3-0 SH 27 (SUTURE)
SUT VIC AB 3-0 SH 27X BRD (SUTURE) IMPLANT
SUT VIC AB 3-0 X1 27 (SUTURE) IMPLANT
SUT VICRYL 4-0 PS2 18IN ABS (SUTURE) IMPLANT
SUTURE E-PAK OPEN HEART (SUTURE) ×2 IMPLANT
SYSTEM SAHARA CHEST DRAIN ATS (WOUND CARE) ×2 IMPLANT
TAPE CLOTH SURG 4X10 WHT LF (GAUZE/BANDAGES/DRESSINGS) ×2 IMPLANT
TOWEL OR 17X24 6PK STRL BLUE (TOWEL DISPOSABLE) ×4 IMPLANT
TOWEL OR 17X26 10 PK STRL BLUE (TOWEL DISPOSABLE) ×4 IMPLANT
TRAY FOLEY IC TEMP SENS 16FR (CATHETERS) ×2 IMPLANT
TUBING INSUFFLATION (TUBING) ×2 IMPLANT
UNDERPAD 30X30 INCONTINENT (UNDERPADS AND DIAPERS) ×2 IMPLANT
WATER STERILE IRR 1000ML POUR (IV SOLUTION) ×4 IMPLANT

## 2016-04-14 NOTE — Brief Op Note (Addendum)
04/13/2016 - 04/14/2016  2:02 PM      [address]       [place] [id number]             [phone]     04/13/2016 - 04/14/2016  2:02 PM  PATIENT:  Jason [name]  65 y.o. male  PRE-OPERATIVE DIAGNOSIS:  CAD  POST-OPERATIVE DIAGNOSIS:  Coronary Artery Disease  PROCEDURE:  Procedure(s): CORONARY ARTERY BYPASS GRAFTING TIMES  4   USING LEFT INTERNAL MAMMARY AND ENDOSCOPIC HARVEST RIGHT GREATER SAPHENOUS VEIN(EVH) LIMA-LAD; SVG-OM; SVG-PDA; SVG-DIAG INTRAOPERATIVE TRANSESOPHAGEAL ECHOCARDIOGRAM  SURGEON:    Purcell Nailslarence H Owen, MD  ASSISTANTS:  Rowe ClackWayne E Gold, PA-C  ANESTHESIA:   Sharee Holstererry Massagee, MD  CROSSCLAMP TIME:   100'  CARDIOPULMONARY BYPASS TIME: 138'  FINDINGS:  Moderate LV systolic dysfunction  Good quality LIMA conduit for grafting  Good quality SVG conduit for grafting  Good quality target vessels for grafting  COMPLICATIONS: None  BASELINE WEIGHT: 126 kg  PATIENT DISPOSITION:   TO SICU IN STABLE CONDITION  Purcell Nailslarence H Owen, MD 04/14/2016 3:51 PM

## 2016-04-14 NOTE — Anesthesia Procedure Notes (Signed)
Procedure Name: Intubation Date/Time: 04/14/2016 10:15 AM Performed by: Lucinda DellECARLO, Jhonathan Desroches M Pre-anesthesia Checklist: Patient identified, Emergency Drugs available, Suction available and Patient being monitored Patient Re-evaluated:Patient Re-evaluated prior to inductionOxygen Delivery Method: Circle system utilized Preoxygenation: Pre-oxygenation with 100% oxygen Intubation Type: IV induction Ventilation: Mask ventilation without difficulty and Oral airway inserted - appropriate to patient size Laryngoscope Size: Mac and 4 Grade View: Grade I Tube type: Oral Tube size: 8.0 mm Number of attempts: 1 Airway Equipment and Method: Stylet Placement Confirmation: ETT inserted through vocal cords under direct vision,  positive ETCO2 and breath sounds checked- equal and bilateral Secured at: 23 cm Tube secured with: Tape Dental Injury: Teeth and Oropharynx as per pre-operative assessment

## 2016-04-14 NOTE — Progress Notes (Signed)
      [address]       [place] [id number]             [phone]        CARDIOTHORACIC SURGERY PROGRESS NOTE  Subjective: No chest pain.  Quiet night.  Objective: Vital signs: BP Readings from Last 1 Encounters:  04/14/16 107/66   Pulse Readings from Last 1 Encounters:  04/14/16 71   Resp Readings from Last 1 Encounters:  04/14/16 15   Temp Readings from Last 1 Encounters:  04/14/16 97.9 F (36.6 C) Oral    Hemodynamics:    Physical Exam:  Rhythm:   sinus  Breath sounds: clear  Heart sounds:  RRR  Incisions:  n/a  Abdomen:  soft  Extremities:  warm   Intake/Output from previous day: 06/19 0701 - 06/20 0700 In: 444 [P.O.:360; I.V.:84] Out: 1450 [Urine:1450] Intake/Output this shift:    Lab Results:  CBC: Recent Labs  04/13/16 1837 04/14/16 0516  WBC 6.8 6.9  HGB 13.9 13.5  HCT 41.8 41.0  PLT 159 144*    BMET:  Recent Labs  04/13/16 1837 04/14/16 0516  NA 135 132*  K 3.8 3.7  CL 102 98*  CO2 27 26  GLUCOSE 156* 176*  BUN 14 14  CREATININE 1.00 1.03  CALCIUM 9.9 9.5     PT/INR:   Recent Labs  04/13/16 1837  LABPROT 15.1  INR 1.17    CBG (last 3)   Recent Labs  04/13/16 2002 04/13/16 2350 04/14/16 0336  GLUCAP 134* 198* 158*    ABG No results found for: PHART, PCO2ART, PO2ART, HCO3, TCO2, ACIDBASEDEF, O2SAT  CXR: PORTABLE CHEST 1 VIEW  COMPARISON: None.  FINDINGS: The lungs are well-aerated and clear. There is no evidence of focal opacification, pleural effusion or pneumothorax.  The cardiomediastinal silhouette is within normal limits. No acute osseous abnormalities are seen.  IMPRESSION: No acute cardiopulmonary process seen.   Electronically Signed  By: Roanna RaiderJeffery Chang M.D.  On: 04/13/2016 18:52  Assessment/Plan:  I have again reviewed the indications, risks and potential benefits of [name] with the patient and his wife this morning.  Expectations for his postoperative  recovery were discussed.  All questions answered.  For OR later this morning.   Purcell Nailslarence H Landin Tallon, MD 04/14/2016 7:39 AM

## 2016-04-14 NOTE — Transfer of Care (Signed)
Immediate Anesthesia Transfer of Care Note  Patient: Jason [name]  Procedure(s) Performed: Procedure(s): CORONARY ARTERY BYPASS GRAFTING TIMES FOUR    USING LEFT INTERNAL MAMMARY AND ENDOSCOPIC HARVEST RIGHT SAPHENOUS VEIN (N/A) INTRAOPERATIVE TRANSESOPHAGEAL ECHOCARDIOGRAM (N/A)  Patient Location: SICU  Anesthesia Type:General  Level of Consciousness: Patient remains intubated per anesthesia plan  Airway & Oxygen Therapy: Patient remains intubated per anesthesia plan and Patient placed on Ventilator (see vital sign flow sheet for setting)  Post-op Assessment: Report given to RN and Post -op Vital signs reviewed and stable  Post vital signs: Reviewed and stable  Last Vitals:  Filed Vitals:   04/14/16 0757 04/14/16 0800  BP:  124/67  Pulse:  73  Temp: 36.6 C   Resp:  21    Last Pain:  Filed Vitals:   04/14/16 0802  PainSc: 0-No pain      Patients Stated Pain Goal: 3 (04/14/16 0400)  Complications: No apparent anesthesia complications

## 2016-04-14 NOTE — OR Nursing (Addendum)
1st call to charge SICU Nurse @1520 , 2nd call 1549,

## 2016-04-14 NOTE — Procedures (Signed)
Extubation Procedure Note  Patient Details:   Name: Jason [name] DOB: 04/10/1951 MRN: [id number]   Airway Documentation:  Airway 8 mm (Active)  Secured at (cm) 23 cm 04/14/2016  5:00 PM  Measured From Lips 04/14/2016  5:00 PM  Secured Location Right 04/14/2016  5:00 PM  Secured By Pink Tape 04/14/2016  5:00 PM  Site Condition Dry 04/14/2016  5:00 PM    Evaluation  O2 sats: stable throughout Complications: No apparent complications Patient did tolerate procedure well. Bilateral Breath Sounds: Clear, Diminished   Yes   [facility] to [facility] without any complications. Patient has an adequate cough and is able to speak. Patient and vitals stable at this time.  Sharene SkeansSilva, Rayley Gao C 04/14/2016, 7:12 PM

## 2016-04-14 NOTE — Progress Notes (Signed)
  Echocardiogram Echocardiogram Transesophageal has been performed.  Leta JunglingCooper, Aissa Lisowski M 04/14/2016, 10:47 AM

## 2016-04-14 NOTE — Progress Notes (Signed)
*  PRELIMINARY RESULTS* Vascular Ultrasound Carotid Duplex (Doppler) has been completed.  Preliminary findings: Bilateral: No significant (1-39%) ICA stenosis. Antegrade vertebral flow.    Only able to perform Carotid Duplex part of Pre [name] Dopplers as pt was heading to the OR.   Farrel DemarkJill Eunice, RDMS, RVT  04/14/2016, 8:30 AM

## 2016-04-14 NOTE — Progress Notes (Signed)
Patient ID: Elpidio AnisJohn C Cashin, male   DOB: 03/12/1951, 65 y.o.   MRN: [id number] EVENING ROUNDS NOTE :     [address]       [place] [id number]             [phone]                 Day of Surgery Procedure(s) (LRB): CORONARY ARTERY BYPASS GRAFTING TIMES FOUR    USING LEFT INTERNAL MAMMARY AND ENDOSCOPIC HARVEST RIGHT SAPHENOUS VEIN (N/A) INTRAOPERATIVE TRANSESOPHAGEAL ECHOCARDIOGRAM (N/A)  Total Length of Stay:  LOS: 1 day  BP 102/71 mmHg  Pulse 79  Temp(Src) 97.9 F (36.6 C) (Oral)  Resp 19  Ht 6\' 4"  (1.93 m)  Wt 277 lb 12.5 oz (126 kg)  BMI 33.83 kg/m2  SpO2 100%  .Intake/Output      06/19 0701 - 06/20 0700 06/20 0701 - 06/21 0700   P.O. 360    I.V. (mL/kg) 84 (0.7) 2695.7 (21.4)   Blood  600   NG/GT  30   IV Piggyback  1150   Total Intake(mL/kg) 444 (3.5) 4475.7 (35.5)   Urine (mL/kg/hr) 1450 1381 (0.9)   Stool  1 (0)   Blood  1200 (0.8)   Chest Tube  110 (0.1)   Total Output 1450 2692   Net -1006 +1783.7          . sodium chloride 20 mL/hr at 04/14/16 1800  . sodium chloride 100 mL/hr at 04/14/16 1800  . [START ON 04/15/2016] sodium chloride    . sodium chloride 20 mL/hr at 04/14/16 1800  . dexmedetomidine Stopped (04/14/16 1808)  . insulin (NOVOLIN-R) infusion 7.9 Units/hr (04/14/16 1800)  . lactated ringers 20 mL/hr at 04/14/16 1800  . lactated ringers 20 mL/hr at 04/14/16 1800  . nitroGLYCERIN Stopped (04/14/16 1630)  . phenylephrine (NEO-SYNEPHRINE) Adult infusion 70 mcg/min (04/14/16 1800)     Lab Results  Component Value Date   WBC 6.9 04/14/2016   HGB 11.2* 04/14/2016   HCT 33.0* 04/14/2016   PLT 166 04/14/2016   GLUCOSE 156* 04/14/2016   CHOL 191 04/13/2016   TRIG 193* 04/13/2016   HDL 25* 04/13/2016   LDLCALC 127* 04/13/2016   ALT 26 04/13/2016   AST 34 04/13/2016   NA 138 04/14/2016   K 3.9 04/14/2016   CL 96* 04/14/2016   CREATININE 0.90 04/14/2016   BUN 14 04/14/2016   CO2 26 04/14/2016   TSH 2.151 04/13/2016   INR 1.48 04/14/2016   HGBA1C 7.6* 04/13/2016   Stable early post op, not bleeding  Starting to wake up Still on vent   Delight OvensEdward B Braedyn Kauk MD  Beeper [phone] Office [phone] 04/14/2016 6:33 PM

## 2016-04-14 NOTE — Op Note (Signed)
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 Cornelius Moraswen, MD  Assistant: Rowe ClackWayne E. Gold, PA-C  Anesthesia: Sharee Holstererry Massagee, MD  Operative Findings:  Moderate LV systolic dysfunction  Good quality LIMA conduit for grafting  Good quality SVG conduit for grafting  Good quality target vessels for grafting    BRIEF CLINICAL NOTE AND INDICATIONS FOR SURGERY  Patient is a 65 year old obese white male with no previous history of coronary artery disease but risk factors notable for history of essential hypertension, type 2 diabetes mellitus, and mixed hyperlipidemia who presents with symptoms of chest pain consistent with unstable angina pectoris and has been transferred [facility] for management of 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 The patient states that he first began to experience symptoms of chest discomfort off and on more than a year ago. Symptoms waxed and waned in severity and seemed a bit atypical in nature. Approximately 3-4 weeks ago the patient began to experience significant increased in severity and frequency of pain. Symptoms are described as a pressure-like pain radiating across the upper chest and occasionally to the left shoulder or back. Symptoms are typically alleviated associated with activity and within 5 minutes of rest. However, the patient states that sometimes he can  perform more strenuous activity without getting any chest discomfort, and other times he seems like he can't do much of anything. He has had 1 or 2 episodes of chest discomfort awakening him from his sleep at night. He has never had an episode of chest discomfort lasting more than 5-10 minutes. Symptoms are not typically associated with shortness of breath or nausea. The patient was referred for cardiology consultation and promptly scheduled for diagnostic catheterization by Dr. Gwen PoundsKowalski. Catheterization performed earlier today reveals what appears to be chronic subtotal occlusion of the left main coronary artery with chronic occlusion of the left anterior descending coronary artery. There are well-developed collaterals filling the left anterior descending coronary artery from the right coronary circulation. Left ventricular function is at least moderately reduced. The [facility].  The patient has been seen in consultation and counseled at length regarding the indications, risks and potential benefits of surgery.  All questions have been answered, and the patient provides full informed consent for the operation as described.    DETAILS OF THE OPERATIVE PROCEDURE  Preparation:  The patient is brought to the operating room on the above mentioned date and central monitoring was established by the anesthesia team including placement of Swan-Ganz catheter and radial arterial line. The patient is placed in the supine position on the operating table.  Intravenous antibiotics are administered. General endotracheal anesthesia is induced uneventfully. A Foley catheter is placed.  Baseline transesophageal echocardiogram was performed.  Findings were notable for moderate LV systolic dysfunction with ejection fraction estimated 35-40%  The patient's chest, abdomen, both groins, and both lower extremities are prepared and draped in a sterile manner.  A time  out procedure is performed.   Surgical Approach and Conduit Harvest:  A median sternotomy incision was performed and the left internal mammary artery is dissected from the chest wall and prepared for bypass grafting. The left internal mammary artery is notably good quality conduit. Simultaneously, the greater saphenous vein is obtained from the patient's right thigh and leg using endoscopic vein harvest technique. The saphenous vein is notably good quality conduit. After removal of the saphenous vein, the small surgical incisions in the lower extremity are closed with absorbable suture. Following systemic heparinization, the left internal mammary artery was transected distally noted to have excellent flow.   Extracorporeal Cardiopulmonary Bypass and Myocardial Protection:  The pericardium is opened. The ascending aorta is normal in appearance. The ascending aorta and the right atrium are cannulated for cardiopulmonary bypass.  Adequate heparinization is verified.   A retrograde cardioplegia cannula is placed through the right atrium into the coronary sinus.  The entire pre-bypass portion of the operation was notable for stable hemodynamics.  Cardiopulmonary bypass was begun and the surface of the heart is inspected. Distal target vessels are selected for coronary artery bypass grafting. A cardioplegia cannula is placed in the ascending aorta.  A temperature probe was placed in the interventricular septum.  The patient is allowed to cool passively to [facility] systemic temperature.  The aortic cross clamp is applied and cold blood cardioplegia is delivered initially in an antegrade fashion through the aortic root.   Supplemental cardioplegia is given retrograde through the coronary sinus catheter.  Iced saline slush is applied for topical hypothermia.  The initial cardioplegic arrest is rapid with early diastolic arrest.  Repeat doses of cardioplegia are administered intermittently throughout the  entire cross clamp portion of the operation through the aortic root, through the coronary sinus catheter, and through subsequently placed vein grafts in order to maintain completely flat electrocardiogram and septal myocardial temperature below 15C.  Myocardial protection was felt to be excellent.  Coronary Artery Bypass Grafting:   The posterior descending branch of the right coronary artery was grafted using a reversed saphenous vein graft in an end-to-side fashion.  At the site of distal anastomosis the target vessel was good quality and measured approximately 2.0 mm in diameter.  The  obtuse marginal branch of the left circumflex coronary artery was grafted using a reversed saphenous vein graft in an end-to-side fashion.  At the site of distal anastomosis the target vessel was good quality and measured approximately 1.8 mm in diameter.  The first diagonal branch of the left anterior descending coronary artery was grafted using a reversed saphenous vein graft in an end-to-side fashion.  At the site of distal anastomosis the target vessel was fair to good quality and measured approximately 1.4 mm in diameter.  The distal left anterior coronary artery was grafted with the left internal mammary artery in an end-to-side fashion.  At the site of distal anastomosis the target vessel was good quality and measured approximately 2.0 mm in diameter.  Two of the three proximal vein graft anastomoses were placed directly to the ascending aorta prior to removal of the aortic cross clamp.  The septal myocardial temperature rose rapidly after reperfusion of the left internal mammary artery graft.  The aortic cross clamp was removed after a total cross clamp time of 90 minutes.  The proximal end of the vein graft to the diagonal branch was sewn to the proximal portion of the vein graft to the obtuse marginal branch in an end to side  fashion.   Procedure Completion:  All proximal and distal coronary anastomoses  were inspected for hemostasis and appropriate graft orientation. There was bleeding noted from the distal anastamosis of the vein graft to the obtuse marginal branch.  A cardioplegia needle was replaced in the proximal ascending aorta.  The cross clamp was reapplied and the heart arrested using a single antegrade dose of cold blood cardioplegia.  The distal anastomosis of the vein graft to the obtuse marginal branch was repaired using a single repair suture.  The cross clamp was removed after a total duration of 10 minutes such that the grand total cross clamp duration for the operation was 100 minutes.  Epicardial pacing wires are fixed to the right ventricular outflow tract and to the right atrial appendage. The patient is rewarmed to 37C temperature. The patient is weaned and disconnected from cardiopulmonary bypass.  The patient's rhythm at separation from bypass was sinus.  The patient was weaned from cardiopulmonary bypass without any inotropic support. Total cardiopulmonary bypass time for the operation was 138 minutes.  Followup transesophageal echocardiogram performed after separation from bypass revealed no changes from the preoperative exam.  The aortic and venous cannula were removed uneventfully. Protamine was administered to reverse the anticoagulation. The mediastinum and pleural space were inspected for hemostasis and irrigated with saline solution. The mediastinum and the left pleural space were drained using 3 chest tubes placed through separate stab incisions inferiorly.  The soft tissues anterior to the aorta were reapproximated loosely. The sternum is closed with double strength sternal wire. The soft tissues anterior to the sternum were closed in multiple layers and the skin is closed with a running subcuticular skin closure.  The post-bypass portion of the operation was notable for stable rhythm and hemodynamics.  No blood products were administered during the  operation.   Disposition:  The patient tolerated the procedure well and is transported to the surgical intensive care in stable condition. There are no intraoperative complications. All sponge instrument and needle counts are verified correct at completion of the operation.    Jason Decent. Cornelius Moras MD 04/14/2016 3:55 PM

## 2016-04-14 NOTE — Progress Notes (Signed)
NIF -20, VC .9L  

## 2016-04-15 ENCOUNTER — Inpatient Hospital Stay (HOSPITAL_COMMUNITY): Payer: Medicare Other

## 2016-04-15 ENCOUNTER — Encounter (HOSPITAL_COMMUNITY): Payer: Self-pay | Admitting: Thoracic Surgery (Cardiothoracic Vascular Surgery)

## 2016-04-15 LAB — CBC
HCT: 29.3 % — ABNORMAL LOW (ref 39.0–52.0)
HCT: 26.9 % — ABNORMAL LOW (ref 39.0–52.0)
Hemoglobin: 10 g/dL — ABNORMAL LOW (ref 13.0–17.0)
Hemoglobin: 9 g/dL — ABNORMAL LOW (ref 13.0–17.0)
MCH: 30.1 pg (ref 26.0–34.0)
MCH: 29.7 pg (ref 26.0–34.0)
MCHC: 34.1 g/dL (ref 30.0–36.0)
MCHC: 33.5 g/dL (ref 30.0–36.0)
MCV: 88.3 fL (ref 78.0–100.0)
MCV: 88.8 fL (ref 78.0–100.0)
PLATELETS: 124 10*3/uL — AB (ref 150–400)
PLATELETS: 118 10*3/uL — AB (ref 150–400)
RBC: 3.32 MIL/uL — ABNORMAL LOW (ref 4.22–5.81)
RBC: 3.03 MIL/uL — ABNORMAL LOW (ref 4.22–5.81)
RDW: 13 % (ref 11.5–15.5)
RDW: 13.2 % (ref 11.5–15.5)
WBC: 11.7 10*3/uL — AB (ref 4.0–10.5)
WBC: 11.4 10*3/uL — AB (ref 4.0–10.5)

## 2016-04-15 LAB — GLUCOSE, CAPILLARY
GLUCOSE-CAPILLARY: 115 mg/dL — AB (ref 65–99)
GLUCOSE-CAPILLARY: 109 mg/dL — AB (ref 65–99)
GLUCOSE-CAPILLARY: 107 mg/dL — AB (ref 65–99)
Glucose-Capillary: 102 mg/dL — ABNORMAL HIGH (ref 65–99)
Glucose-Capillary: 114 mg/dL — ABNORMAL HIGH (ref 65–99)
Glucose-Capillary: 130 mg/dL — ABNORMAL HIGH (ref 65–99)
Glucose-Capillary: 148 mg/dL — ABNORMAL HIGH (ref 65–99)
Glucose-Capillary: 162 mg/dL — ABNORMAL HIGH (ref 65–99)
Glucose-Capillary: 189 mg/dL — ABNORMAL HIGH (ref 65–99)

## 2016-04-15 LAB — MAGNESIUM
Magnesium: 2.2 mg/dL (ref 1.7–2.4)
Magnesium: 2.1 mg/dL (ref 1.7–2.4)

## 2016-04-15 LAB — BASIC METABOLIC PANEL
ANION GAP: 5 (ref 5–15)
BUN: 9 mg/dL (ref 6–20)
CALCIUM: 7.9 mg/dL — AB (ref 8.9–10.3)
CO2: 25 mmol/L (ref 22–32)
Chloride: 106 mmol/L (ref 101–111)
Creatinine, Ser: 0.81 mg/dL (ref 0.61–1.24)
GFR calc Af Amer: >60 mL/min (ref >60)
GLUCOSE: 116 mg/dL — AB (ref 65–99)
Potassium: 4 mmol/L (ref 3.5–5.1)
SODIUM: 136 mmol/L (ref 135–145)

## 2016-04-15 LAB — POCT I-STAT, CHEM 8
BUN: 12 mg/dL (ref 6–20)
CALCIUM ION: 1.23 mmol/L (ref 1.13–1.30)
CHLORIDE: 97 mmol/L — AB (ref 101–111)
CREATININE: 1 mg/dL (ref 0.61–1.24)
Glucose, Bld: 149 mg/dL — ABNORMAL HIGH (ref 65–99)
HCT: 26 % — ABNORMAL LOW (ref 39.0–52.0)
Hemoglobin: 8.8 g/dL — ABNORMAL LOW (ref 13.0–17.0)
Potassium: 4.2 mmol/L (ref 3.5–5.1)
Sodium: 134 mmol/L — ABNORMAL LOW (ref 135–145)
TCO2: 26 mmol/L (ref 0–100)

## 2016-04-15 LAB — CREATININE, SERUM
Creatinine, Ser: 1.09 mg/dL (ref 0.61–1.24)
GFR calc Af Amer: >60 mL/min (ref >60)
GFR calc non Af Amer: >60 mL/min (ref >60)

## 2016-04-15 MED ORDER — CARVEDILOL 12.5 MG PO TABS
12.5000 mg | ORAL_TABLET | Freq: Two times a day (BID) | ORAL | Status: DC
  Administered 2016-04-15 (×2): 12.5 mg via ORAL
  Filled 2016-04-15 (×2): qty 1

## 2016-04-15 MED ORDER — INSULIN ASPART 100 UNIT/ML ~~LOC~~ SOLN
0.0000 [IU] | SUBCUTANEOUS | Status: DC
  Administered 2016-04-15: 2 [IU] via SUBCUTANEOUS
  Administered 2016-04-15: 4 [IU] via SUBCUTANEOUS
  Administered 2016-04-15: 2 [IU] via SUBCUTANEOUS
  Administered 2016-04-16 (×2): 4 [IU] via SUBCUTANEOUS

## 2016-04-15 MED ORDER — VANCOMYCIN HCL IN DEXTROSE 1-5 GM/200ML-% IV SOLN
1000.0000 mg | Freq: Once | INTRAVENOUS | Status: AC
  Administered 2016-04-15: 1000 mg via INTRAVENOUS
  Filled 2016-04-15: qty 200

## 2016-04-15 MED ORDER — INSULIN DETEMIR 100 UNIT/ML ~~LOC~~ SOLN
20.0000 [IU] | Freq: Two times a day (BID) | SUBCUTANEOUS | Status: DC
  Administered 2016-04-15 – 2016-04-17 (×5): 20 [IU] via SUBCUTANEOUS
  Filled 2016-04-15 (×9): qty 0.2

## 2016-04-15 MED ORDER — ATORVASTATIN CALCIUM 80 MG PO TABS
80.0000 mg | ORAL_TABLET | Freq: Every day | ORAL | Status: DC
  Administered 2016-04-16 – 2016-05-05 (×13): 80 mg via ORAL
  Filled 2016-04-15 (×15): qty 1

## 2016-04-15 MED ORDER — CARVEDILOL 6.25 MG PO TABS: 6.2500 mg | ORAL_TABLET | Freq: Two times a day (BID) | ORAL | Status: DC

## 2016-04-15 MED ORDER — GEMFIBROZIL 600 MG PO TABS
600.0000 mg | ORAL_TABLET | Freq: Two times a day (BID) | ORAL | Status: DC
  Administered 2016-04-16 – 2016-05-06 (×27): 600 mg via ORAL
  Filled 2016-04-15 (×42): qty 1

## 2016-04-15 MED ORDER — KETOROLAC TROMETHAMINE 15 MG/ML IJ SOLN
30.0000 mg | Freq: Once | INTRAMUSCULAR | Status: AC
  Administered 2016-04-15: 30 mg via INTRAVENOUS
  Filled 2016-04-15: qty 2

## 2016-04-15 MED ORDER — FUROSEMIDE 10 MG/ML IJ SOLN
20.0000 mg | Freq: Once | INTRAMUSCULAR | Status: AC
  Administered 2016-04-15: 20 mg via INTRAVENOUS

## 2016-04-15 MED FILL — Heparin Sodium (Porcine) Inj 1000 Unit/ML: INTRAMUSCULAR | Qty: 20 | Status: AC

## 2016-04-15 MED FILL — Mannitol IV Soln 20%: INTRAVENOUS | Qty: 500 | Status: AC

## 2016-04-15 MED FILL — Electrolyte-R (PH 7.4) Solution: INTRAVENOUS | Qty: 4000 | Status: AC

## 2016-04-15 MED FILL — Sodium Bicarbonate IV Soln 8.4%: INTRAVENOUS | Qty: 50 | Status: AC

## 2016-04-15 MED FILL — Lidocaine HCl IV Inj 20 MG/ML: INTRAVENOUS | Qty: 5 | Status: AC

## 2016-04-15 MED FILL — Electrolyte-R (PH 7.4) Solution: INTRAVENOUS | Qty: 3000 | Status: AC

## 2016-04-15 NOTE — Progress Notes (Addendum)
[address]       [place] [id number]             [phone]        CARDIOTHORACIC SURGERY PROGRESS NOTE   R1 Day Post-Op Procedure(s) (LRB): CORONARY ARTERY BYPASS GRAFTING TIMES FOUR    USING LEFT INTERNAL MAMMARY AND ENDOSCOPIC HARVEST RIGHT SAPHENOUS VEIN (N/A) INTRAOPERATIVE TRANSESOPHAGEAL ECHOCARDIOGRAM (N/A)  Subjective: Looks good.  Feels appropriately sore in chest.  No SOB.  No nausea.  Objective: Vital signs: BP Readings from Last 1 Encounters:  04/15/16 119/68   Pulse Readings from Last 1 Encounters:  04/15/16 93   Resp Readings from Last 1 Encounters:  04/15/16 21   Temp Readings from Last 1 Encounters:  04/15/16 98.6 F (37 C) Core (Comment)    Hemodynamics: PAP: (26-43)/(13-31) 35/23 mmHg CO:  [4 L/min-6.6 L/min] 6.6 L/min CI:  [1.6 L/min/m2-2.6 L/min/m2] 2.6 L/min/m2  Physical Exam:  Rhythm:   sinus  Breath sounds: clear  Heart sounds:  RRR  Incisions:  Dressing dry, intact  Abdomen:  Soft, non-distended, non-tender  Extremities:  Warm, well-perfused  Chest tubes:  Decreasing volume thin serosanguinous output, no air leak    Intake/Output from previous day: 06/20 0701 - 06/21 0700 In: 6814.3 [P.O.:60; I.V.:4874.3; Blood:600; NG/GT:30; IV Piggyback:1250] Out: 3967 [Urine:2306; Emesis/NG output:50; Stool:1; Blood:1200; Chest Tube:410] Intake/Output this shift: Total I/O In: 54.7 [I.V.:54.7] Out: 25 [Urine:25]  Lab Results:  CBC: Recent Labs  04/14/16 2230 04/15/16 0345  WBC 18.6* 11.7*  HGB 10.7* 10.0*  HCT 31.7* 29.3*  PLT 191 124*    BMET:  Recent Labs  04/14/16 0516  04/14/16 2226 04/14/16 2230 04/15/16 0345  NA 132*  < > 138  --  136  K 3.7  < > 4.2  --  4.0  CL 98*  < > 101  --  106  CO2 26  --   --   --  25  GLUCOSE 176*  < > 108*  --  116*  BUN 14  < > 12  --  9  CREATININE 1.03  < > 0.80 0.80 0.81  CALCIUM 9.5  --   --   --  7.9*  < > = values in this interval not displayed.   PT/INR:    Recent Labs  04/14/16 1635  LABPROT 18.0*  INR 1.48    CBG (last 3)   Recent Labs  04/14/16 2159 04/14/16 2259 04/15/16 0007  GLUCAP 109* 107* 102*    ABG    Component Value Date/Time   PHART 7.328* 04/14/2016 2006   PCO2ART 47.5* 04/14/2016 2006   PO2ART 66.0* 04/14/2016 2006   HCO3 25.0* 04/14/2016 2006   TCO2 26 04/14/2016 2226   ACIDBASEDEF 1.0 04/14/2016 2006   O2SAT 91.0 04/14/2016 2006    EKG: NSR w/out acute ischemic changes    CXR: PORTABLE CHEST 1 VIEW  COMPARISON: 04/14/2016  FINDINGS: Cardiomegaly again noted. Status post [name]. Mediastinal drain is unchanged in position. Stable left chest tube position. Right IJ Swan-Ganz catheter is unchanged in position. NG tube and endotracheal tube has been removed. Central mild vascular congestion without pulmonary edema. Mild atelectasis in left upper lobe medially is stable. There is no pneumothorax. Tiny left pleural effusion left basilar atelectasis. No pulmonary edema.  IMPRESSION: Status post median sternotomy. Left chest tube is unchanged in position. No pneumothorax. Stable left upper lobe perihilar atelectasis. Swan-Ganz catheter is unchanged in position. NG tube and endotracheal tube  has been removed. Trace left pleural effusion with left basilar atelectasis.   Electronically Signed  By: Natasha Mead M.D.  On: 04/15/2016 08:03   Assessment/Plan: S/P Procedure(s) (LRB): CORONARY ARTERY BYPASS GRAFTING TIMES FOUR    USING LEFT INTERNAL MAMMARY AND ENDOSCOPIC HARVEST RIGHT SAPHENOUS VEIN (N/A) INTRAOPERATIVE TRANSESOPHAGEAL ECHOCARDIOGRAM (N/A)  Doing well POD1 Maintaining NSR w/ stable hemodynamics, no drips Breathing comfortably w/ O2 sats 99% on 2 L/min Expected post op acute blood loss anemia, mild, Hgb 10.0 stable Expected post op atelectasis, mild Expected post op volume excess, mild Type II diabetes mellitus, excellent glycemic control on insulin  drip   Mobilize  Diuresis  D/C lines  D/C tubes later today or am tomorrow depending on output  Add levemir insulin and wean drip  One dose toradol for added pain control  Plavix w/out aspirin due to reported allergy to aspirin  Restart Carvedilol  Try Lipitor despite h/o intolerance to several other statins   Purcell Nails, MD 04/15/2016 8:26 AM

## 2016-04-15 NOTE — Care Management Important Message (Signed)
Important Message  Patient Details  Name: Jason AnisJohn C [name] MRN: [id number] Date of Birth: 02/27/1951   Medicare Important Message Given:  Yes    Kamile Fassler, Stephan MinisterSusan Coleman 04/15/2016, 1:42 PM

## 2016-04-15 NOTE — Op Note (Signed)
NAME:  Jason [name], Jason [name]                ACCOUNT NO.:  [id number]  MEDICAL RECORD NO.:  [id number]  LOCATION:                                 FACILITY:  PHYSICIAN:  Jason FortsJames T. Der [name], M.D.DATE OF BIRTH:  26-Jul-1951  DATE OF PROCEDURE:  04/14/2016 DATE OF DISCHARGE:                              OPERATIVE REPORT   Mr. Jason NovakHinshaw is a 65 year old gentleman who presents today for coronary artery bypass grafting.  This is to be performed by Dr. Purcell Nailslarence H. Owen.  The patient was brought to the holding area the morning of surgery where under local anesthesia with sedation pulmonary artery and radial arterial lines were placed.  He was then transferred to the OR for routine induction of general anesthesia, after which the TEE probe was prepared and passed into the stomach for imaging of the cardiac structures.  PRECARDIOPULMONARY BYPASS TEE EXAMINATION:  Left ventricle:  Left ventricular chamber is seen initially in the short axis view.  Overall it is somewhat generous left ventricular chamber not exceeding normals for a male.  There is mildly reduced overall left ventricular chamber function appreciated in this short axis view.  Estimated ejection fraction in the order of 35-40% at this time.  Long-axis view again shows reasonable anterior wall motion and mildly decreased inferior wall motion.  Papillary muscles are well outlined.  Apical areas are seen. No masses are appreciated within.  Mitral valve:  The mitral valve is mildly degenerative in appearance but normal overall in its function and coaptation points.  There is small calcium deposition at the posterior leaflet edge of the annular area.  Color Doppler shows trivial mitral regurgitation overall.  Pulse- wave is consistent with diastolic dysfunction with blunted E waves during diastole inflow.  Left atrium:  Left atrial chamber is mildly dilated and generous chamber.  There are no masses appreciated within.  The left  atrial appendage is visualized and is clear.  Of note, along the interatrial septum there is a very small inferiorly placed PFO appreciated.  This is very small and insignificant.  Aortic valve:  The aortic valve is seen initially in the short axis view.  It is trileaflet and there is mild aortic sclerosis appreciated. Overall function is normal.  There is satisfactory opening during systolic ejection and satisfactory closing during the diastolic period. On the long-axis views there is essentially no regurgitant jets appreciated.  Overall this is a normal aortic valve.  Right ventricle:  The right ventricular chamber is seen initially in the four-chamber view.  Overall it is a somewhat enlarged and generous chamber in its overall size.  The apex of the right ventricle extends almost down to the apex of the left ventricular chamber itself.  Overall the free wall in the contractile portion of the right ventricular that is seen is contractile and functioning normally.  Tricuspid valve:  Tricuspid valve is thin, compliant mobile.  There is trivial tricuspid regurgitant flow appreciated.  Right atrium:  Right atrial chamber is interrogated.  Pulmonary artery catheter is noted within.  Other than the area of that small PFO the interatrial septum is intact.  There are no other abnormalities noted within the right atrial  chamber itself.  Patient placed on cardiopulmonary bypass.  Coronary artery bypass grafting is carried out.  The patient is rewarmed, separated from cardiopulmonary bypass with the initial attempt.  POST CARDIOPULMONARY BYPASS TEE EXAMINATION (LIMITED EXAM):  Left ventricle:  The left ventricular chamber is now seen in both short and long axis view.  It is dynamic, contractile and normally functioning left ventricular chamber.  Overall ejection fraction appears even improved compared to the prebypass era.  The short-axis views again were all segmental wall areas seem to  thicken and contract appropriately.  The rest of the cardiac examination was as previously described without any significant changes except for very mild increase in the trivial regurgitant flow that was noted along the tricuspid valve area.  The rest of the cardiac examination was as previously described and the patient has returned to the cardiac intensive care unit in stable condition.    ______________________________ Jason [name], M.D.   ______________________________ Jason [name], M.D.    JTM/MEDQ  D:  04/14/2016  T:  04/15/2016  Job:  [id number]

## 2016-04-15 NOTE — Anesthesia Postprocedure Evaluation (Signed)
Anesthesia Post Note  Patient: Jason [name]  Procedure(s) Performed: Procedure(s) (LRB): CORONARY ARTERY BYPASS GRAFTING TIMES FOUR    USING LEFT INTERNAL MAMMARY AND ENDOSCOPIC HARVEST RIGHT SAPHENOUS VEIN (N/A) INTRAOPERATIVE TRANSESOPHAGEAL ECHOCARDIOGRAM (N/A)  Patient location during evaluation: SICU Anesthesia Type: General Level of consciousness: sedated and patient remains intubated per anesthesia plan Pain management: pain level controlled Vital Signs Assessment: post-procedure vital signs reviewed and stable Respiratory status: patient remains intubated per anesthesia plan Cardiovascular status: stable Anesthetic complications: no    Last Vitals:  Filed Vitals:   04/15/16 0800 04/15/16 0900  BP: 119/68 124/70  Pulse: 93 95  Temp: 37 C   Resp: 21 23    Last Pain:  Filed Vitals:   04/15/16 0903  PainSc: 8                  Jason [name],Jason [name]

## 2016-04-15 NOTE — Progress Notes (Signed)
T[facility] BRIEF SICU PROGRESS NOTE  1 Day Post-Op  S/P Procedure(s) (LRB): CORONARY ARTERY BYPASS GRAFTING TIMES FOUR    USING LEFT INTERNAL MAMMARY AND ENDOSCOPIC HARVEST RIGHT SAPHENOUS VEIN (N/A) INTRAOPERATIVE TRANSESOPHAGEAL ECHOCARDIOGRAM (N/A)   Stable day Ambulated around SICU NSR w/ stable BP Breathing comfortably w/ O2 sats 98% on 2 L/min UOP adequate  Plan: Continue current plan  Purcell Nailslarence H Damyon Mullane, MD 04/15/2016 6:53 PM

## 2016-04-16 ENCOUNTER — Inpatient Hospital Stay (HOSPITAL_COMMUNITY): Payer: Medicare Other

## 2016-04-16 LAB — BASIC METABOLIC PANEL
ANION GAP: 5 (ref 5–15)
BUN: 14 mg/dL (ref 6–20)
CHLORIDE: 99 mmol/L — AB (ref 101–111)
CO2: 28 mmol/L (ref 22–32)
Calcium: 8.5 mg/dL — ABNORMAL LOW (ref 8.9–10.3)
Creatinine, Ser: 1.19 mg/dL (ref 0.61–1.24)
Glucose, Bld: 172 mg/dL — ABNORMAL HIGH (ref 65–99)
POTASSIUM: 4.2 mmol/L (ref 3.5–5.1)
SODIUM: 132 mmol/L — AB (ref 135–145)

## 2016-04-16 LAB — GLUCOSE, CAPILLARY
GLUCOSE-CAPILLARY: 99 mg/dL (ref 65–99)
GLUCOSE-CAPILLARY: 116 mg/dL — AB (ref 65–99)
GLUCOSE-CAPILLARY: 114 mg/dL — AB (ref 65–99)
GLUCOSE-CAPILLARY: 142 mg/dL — AB (ref 65–99)
GLUCOSE-CAPILLARY: 124 mg/dL — AB (ref 65–99)
GLUCOSE-CAPILLARY: 139 mg/dL — AB (ref 65–99)
GLUCOSE-CAPILLARY: 129 mg/dL — AB (ref 65–99)
GLUCOSE-CAPILLARY: 189 mg/dL — AB (ref 65–99)
GLUCOSE-CAPILLARY: 266 mg/dL — AB (ref 65–99)
Glucose-Capillary: 106 mg/dL — ABNORMAL HIGH (ref 65–99)
Glucose-Capillary: 121 mg/dL — ABNORMAL HIGH (ref 65–99)
Glucose-Capillary: 120 mg/dL — ABNORMAL HIGH (ref 65–99)
Glucose-Capillary: 125 mg/dL — ABNORMAL HIGH (ref 65–99)
Glucose-Capillary: 106 mg/dL — ABNORMAL HIGH (ref 65–99)
Glucose-Capillary: 122 mg/dL — ABNORMAL HIGH (ref 65–99)
Glucose-Capillary: 116 mg/dL — ABNORMAL HIGH (ref 65–99)
Glucose-Capillary: 165 mg/dL — ABNORMAL HIGH (ref 65–99)
Glucose-Capillary: 257 mg/dL — ABNORMAL HIGH (ref 65–99)
Glucose-Capillary: 262 mg/dL — ABNORMAL HIGH (ref 65–99)

## 2016-04-16 LAB — CBC
HEMATOCRIT: 26.7 % — AB (ref 39.0–52.0)
HEMOGLOBIN: 8.8 g/dL — AB (ref 13.0–17.0)
MCH: 29.7 pg (ref 26.0–34.0)
MCHC: 33 g/dL (ref 30.0–36.0)
MCV: 90.2 fL (ref 78.0–100.0)
Platelets: 128 10*3/uL — ABNORMAL LOW (ref 150–400)
RBC: 2.96 MIL/uL — AB (ref 4.22–5.81)
RDW: 13.2 % (ref 11.5–15.5)
WBC: 11.7 10*3/uL — AB (ref 4.0–10.5)

## 2016-04-16 MED ORDER — POTASSIUM CHLORIDE CRYS ER 20 MEQ PO TBCR: 20.0000 meq | EXTENDED_RELEASE_TABLET | Freq: Every day | ORAL | Status: DC

## 2016-04-16 MED ORDER — MOVING RIGHT ALONG BOOK
Freq: Once | Status: AC
  Administered 2016-04-16: 08:00:00
  Filled 2016-04-16: qty 1

## 2016-04-16 MED ORDER — FUROSEMIDE 10 MG/ML IJ SOLN
40.0000 mg | Freq: Two times a day (BID) | INTRAMUSCULAR | Status: AC
  Administered 2016-04-16 (×2): 40 mg via INTRAVENOUS
  Filled 2016-04-16 (×2): qty 4

## 2016-04-16 MED ORDER — FUROSEMIDE 40 MG PO TABS
40.0000 mg | ORAL_TABLET | Freq: Two times a day (BID) | ORAL | Status: DC
  Administered 2016-04-17 – 2016-04-19 (×6): 40 mg via ORAL
  Filled 2016-04-16 (×7): qty 1

## 2016-04-16 MED ORDER — POTASSIUM CHLORIDE CRYS ER 20 MEQ PO TBCR
20.0000 meq | EXTENDED_RELEASE_TABLET | Freq: Two times a day (BID) | ORAL | Status: DC
  Administered 2016-04-17 – 2016-04-19 (×5): 20 meq via ORAL
  Filled 2016-04-16 (×5): qty 1

## 2016-04-16 MED ORDER — CARVEDILOL 12.5 MG PO TABS
12.5000 mg | ORAL_TABLET | Freq: Two times a day (BID) | ORAL | Status: DC
  Administered 2016-04-16 – 2016-04-19 (×8): 12.5 mg via ORAL
  Filled 2016-04-16 (×9): qty 1

## 2016-04-16 MED ORDER — SODIUM CHLORIDE 0.9 % IV SOLN: 250.0000 mL | INTRAVENOUS | Status: DC | PRN

## 2016-04-16 MED ORDER — SODIUM CHLORIDE 0.9% FLUSH
3.0000 mL | Freq: Two times a day (BID) | INTRAVENOUS | Status: DC
  Administered 2016-04-16 – 2016-04-19 (×6): 3 mL via INTRAVENOUS

## 2016-04-16 MED ORDER — SODIUM CHLORIDE 0.9% FLUSH: 3.0000 mL | INTRAVENOUS | Status: DC | PRN

## 2016-04-16 MED ORDER — INSULIN ASPART 100 UNIT/ML ~~LOC~~ SOLN
0.0000 [IU] | Freq: Three times a day (TID) | SUBCUTANEOUS | Status: DC
  Administered 2016-04-16: 4 [IU] via SUBCUTANEOUS
  Administered 2016-04-16 (×3): 12 [IU] via SUBCUTANEOUS
  Administered 2016-04-17: 8 [IU] via SUBCUTANEOUS
  Administered 2016-04-17: 4 [IU] via SUBCUTANEOUS
  Administered 2016-04-17: 8 [IU] via SUBCUTANEOUS
  Administered 2016-04-18: 4 [IU] via SUBCUTANEOUS
  Administered 2016-04-18: 8 [IU] via SUBCUTANEOUS
  Administered 2016-04-18: 12 [IU] via SUBCUTANEOUS
  Administered 2016-04-18: 8 [IU] via SUBCUTANEOUS
  Administered 2016-04-19 – 2016-04-20 (×4): 2 [IU] via SUBCUTANEOUS
  Administered 2016-04-20: 4 [IU] via SUBCUTANEOUS
  Administered 2016-04-20: 2 [IU] via SUBCUTANEOUS
  Administered 2016-04-20: 4 [IU] via SUBCUTANEOUS
  Administered 2016-04-21: 8 [IU] via SUBCUTANEOUS
  Administered 2016-04-21 (×3): 4 [IU] via SUBCUTANEOUS
  Administered 2016-04-22: 8 [IU] via SUBCUTANEOUS

## 2016-04-16 MED ORDER — FUROSEMIDE 40 MG PO TABS: 40.0000 mg | ORAL_TABLET | Freq: Every day | ORAL | Status: DC

## 2016-04-16 MED ORDER — ENOXAPARIN SODIUM 40 MG/0.4ML ~~LOC~~ SOLN
40.0000 mg | SUBCUTANEOUS | Status: DC
  Administered 2016-04-17 – 2016-05-06 (×20): 40 mg via SUBCUTANEOUS
  Filled 2016-04-16 (×20): qty 0.4

## 2016-04-16 NOTE — Progress Notes (Signed)
Pt just back to bed, sts he is in pain after sitting on EOB for a while. Asking for pain meds now, needs a nap. Has walked x2. Will allow to rest today, encouraged walking later. Jason ChickKristan Mubashir Mallek CES, ACSM 2:29 PM 04/16/2016

## 2016-04-16 NOTE — Progress Notes (Signed)
Pt transferred to 2W25; pt ambulated from 2S08 to 2W25 without any difficulty pushing wheelchair; VSS upon arrival to room; charge RN in room upon transfer; attempted to call wife to make aware of transfer, but no answer; pt with cell phone at bedside stating he would call her to make her aware; pt assisted to bed upon transfer; personal belongings, SCD's, and chart along with transfer; will cont. To monitor  Jason SpragueYates, Jason [name]

## 2016-04-16 NOTE — Progress Notes (Signed)
Report called to Science HillLexie, RN 2W; pt to transfer to 2W25; will cont. To monitor.  Benjamine SpragueYates, Brexlee Heberlein A

## 2016-04-16 NOTE — Progress Notes (Addendum)
      [address]       [place] [id number]             [phone]        CARDIOTHORACIC SURGERY PROGRESS NOTE   R2 Days Post-Op Procedure(s) (LRB): CORONARY ARTERY BYPASS GRAFTING TIMES FOUR    USING LEFT INTERNAL MAMMARY AND ENDOSCOPIC HARVEST RIGHT SAPHENOUS VEIN (N/A) INTRAOPERATIVE TRANSESOPHAGEAL ECHOCARDIOGRAM (N/A)  Subjective: Just ambulated around ICU.  Feels sore in chest.  Otherwise okay.  Objective: Vital signs: BP Readings from Last 1 Encounters:  04/16/16 131/75   Pulse Readings from Last 1 Encounters:  04/16/16 114   Resp Readings from Last 1 Encounters:  04/16/16 26   Temp Readings from Last 1 Encounters:  04/16/16 99.2 F (37.3 C) Oral    Hemodynamics: PAP: (35-38)/(23-25) 38/25 mmHg  Physical Exam:  Rhythm:   sinus  Breath sounds: clear  Heart sounds:  RRR  Incisions:  Dressing dry, intact  Abdomen:  Soft, non-distended, non-tender  Extremities:  Warm, well-perfused    Intake/Output from previous day: 06/21 0701 - 06/22 0700 In: 862.7 [P.O.:160; I.V.:352.7; IV Piggyback:350] Out: 1155 [Urine:1025; Chest Tube:130] Intake/Output this shift:    Lab Results:  CBC: Recent Labs  04/15/16 1710 04/16/16 0446  WBC 11.4* 11.7*  HGB 9.0* 8.8*  HCT 26.9* 26.7*  PLT 118* 128*    BMET:  Recent Labs  04/15/16 0345 04/15/16 1707 04/15/16 1710 04/16/16 0446  NA 136 134*  --  132*  K 4.0 4.2  --  4.2  CL 106 97*  --  99*  CO2 25  --   --  28  GLUCOSE 116* 149*  --  172*  BUN 9 12  --  14  CREATININE 0.81 1.00 1.09 1.19  CALCIUM 7.9*  --   --  8.5*     PT/INR:   Recent Labs  04/14/16 1635  LABPROT 18.0*  INR 1.48    CBG (last 3)   Recent Labs  04/15/16 1918 04/15/16 2349 04/16/16 0345  GLUCAP 162* 189* 165*    ABG    Component Value Date/Time   PHART 7.328* 04/14/2016 2006   PCO2ART 47.5* 04/14/2016 2006   PO2ART 66.0* 04/14/2016 2006   HCO3 25.0* 04/14/2016 2006   TCO2 26 04/15/2016 1707   ACIDBASEDEF 1.0 04/14/2016 2006   O2SAT 91.0 04/14/2016 2006    CXR: Mild bibasilar atelectasis L>R  Assessment/Plan: S/P Procedure(s) (LRB): CORONARY ARTERY BYPASS GRAFTING TIMES FOUR    USING LEFT INTERNAL MAMMARY AND ENDOSCOPIC HARVEST RIGHT SAPHENOUS VEIN (N/A) INTRAOPERATIVE TRANSESOPHAGEAL ECHOCARDIOGRAM (N/A)  Doing well POD2 Maintaining NSR - sinus tach w/ stable BP Breathing comfortably w/ O2 sats 93-97% on 1 L/min via [place] Expected post op acute blood loss anemia, mild, Hgb down slightly 8.8 Expected post op atelectasis, mild, L>R Expected post op volume excess, mild Type II diabetes mellitus, adequate glycemic control off insulin drip   Increase carvedilol to pre-op dose  Mobilize  Diuresis  Restart Hyzaar in 1-2 days if BP continues to rise  Continue levemir + SSI for now  Restart home diabetes meds once oral intake improves  Plavix for CAD with preop unstable angina due to h/o allergy to ASA  Try Lipitor despite h/o intolerance to several other statins  Transfer step down  Purcell Nailslarence H Owen, MD 04/16/2016 7:20 AM

## 2016-04-17 ENCOUNTER — Inpatient Hospital Stay (HOSPITAL_COMMUNITY): Payer: Medicare Other

## 2016-04-17 LAB — CBC
HCT: 24.8 % — ABNORMAL LOW (ref 39.0–52.0)
Hemoglobin: 8.4 g/dL — ABNORMAL LOW (ref 13.0–17.0)
MCH: 29.9 pg (ref 26.0–34.0)
MCHC: 33.9 g/dL (ref 30.0–36.0)
MCV: 88.3 fL (ref 78.0–100.0)
PLATELETS: 145 10*3/uL — AB (ref 150–400)
RBC: 2.81 MIL/uL — ABNORMAL LOW (ref 4.22–5.81)
RDW: 13.1 % (ref 11.5–15.5)
WBC: 12 10*3/uL — ABNORMAL HIGH (ref 4.0–10.5)

## 2016-04-17 LAB — BASIC METABOLIC PANEL
ANION GAP: 5 (ref 5–15)
BUN: 20 mg/dL (ref 6–20)
CALCIUM: 8.7 mg/dL — AB (ref 8.9–10.3)
CO2: 29 mmol/L (ref 22–32)
Chloride: 98 mmol/L — ABNORMAL LOW (ref 101–111)
Creatinine, Ser: 1.32 mg/dL — ABNORMAL HIGH (ref 0.61–1.24)
GFR, EST NON AFRICAN AMERICAN: 55 mL/min — AB (ref >60)
GLUCOSE: 185 mg/dL — AB (ref 65–99)
Potassium: 3.9 mmol/L (ref 3.5–5.1)
Sodium: 132 mmol/L — ABNORMAL LOW (ref 135–145)

## 2016-04-17 LAB — GLUCOSE, CAPILLARY
GLUCOSE-CAPILLARY: 210 mg/dL — AB (ref 65–99)
GLUCOSE-CAPILLARY: 230 mg/dL — AB (ref 65–99)
Glucose-Capillary: 197 mg/dL — ABNORMAL HIGH (ref 65–99)
Glucose-Capillary: 192 mg/dL — ABNORMAL HIGH (ref 65–99)

## 2016-04-17 MED ORDER — METOCLOPRAMIDE HCL 5 MG/ML IJ SOLN
10.0000 mg | Freq: Three times a day (TID) | INTRAMUSCULAR | Status: AC
  Administered 2016-04-17 (×3): 10 mg via INTRAVENOUS
  Filled 2016-04-17 (×3): qty 2

## 2016-04-17 MED ORDER — LACTULOSE 10 GM/15ML PO SOLN
30.0000 g | Freq: Every day | ORAL | Status: DC | PRN
  Administered 2016-04-18: 30 g via ORAL
  Filled 2016-04-17: qty 45

## 2016-04-17 MED ORDER — GI COCKTAIL ~~LOC~~
30.0000 mL | Freq: Every day | ORAL | Status: DC | PRN
  Administered 2016-04-17: 30 mL via ORAL
  Filled 2016-04-17: qty 30

## 2016-04-17 NOTE — Progress Notes (Signed)
CARDIAC REHAB PHASE I   PRE:  Rate/Rhythm: 102 ST    BP: sitting     SaO2:   MODE:  Ambulation: 150 ft   POST:  Rate/Rhythm: 113 ST    BP: sitting 112/65     SaO2: 94 RA  Pt came out of bathroom walking around unit with NT. Joined pt assist x2 and he was able to walk circle. He c/o a bad day with significant nausea.  Could not go farther. To bed with significant difficulty getting in bed due to lack of effort and pts size. Will continue to follow. This was his first walk today. [id number]  Harriet MassonRandi Kristan Hobart Marte CES, ACSM 04/17/2016 2:20 PM

## 2016-04-17 NOTE — Care Management Important Message (Signed)
Important Message  Patient Details  Name: Jason AnisJohn C [name] MRN: [id number] Date of Birth: 08/21/1951   Medicare Important Message Given:  Yes    [name] [name] 04/17/2016, 10:30 AM

## 2016-04-17 NOTE — Discharge Summary (Signed)
Physician Discharge Summary  Patient ID: Jason [name] MRN: [id number] DOB/AGE: 66/03/1951 66 y.o.  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 Cellulitis 05/01/2016  . Ileus, postoperative 05/01/2016  . 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 Unstable angina (HCC) 04/13/2016  . 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 Type II diabetes mellitus (HCC)   . Essential hypertension   . Mixed hyperlipidemia   . Obesity    HPI:  Patient is a 65 year old obese white male with no previous history of coronary artery disease but risk factors notable for history of essential hypertension, type 2 diabetes mellitus, and mixed hyperlipidemia who presents with symptoms of chest pain consistent with unstable angina pectoris and has been transferred from [facility] for management of 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 The patient states that he first began to experience symptoms of chest discomfort off and on more than a year ago. Symptoms waxed and waned in severity and seemed a bit atypical in nature. Approximately 3-4 weeks ago the patient began to experience significant increased in severity and frequency of pain. Symptoms are described as a pressure-like pain radiating across the upper chest and occasionally to the left shoulder or back. Symptoms are  typically alleviated associated with activity and within 5 minutes of rest. However, the patient states that sometimes he can perform more strenuous activity without getting any chest discomfort, and other times he seems like he can't do much of anything. He has had 1 or 2 episodes of chest discomfort awakening him from his sleep at night. He has never had an episode of chest discomfort lasting more than 5-10 minutes. Symptoms are not typically associated with shortness of breath or nausea. The patient was referred for cardiology consultation and promptly scheduled for diagnostic catheterization by Dr. Gwen PoundsKowalski. Catheterization performed earlier today reveals what appears to be chronic subtotal occlusion of the left main coronary artery with chronic occlusion of the left anterior descending coronary artery. There are well-developed collaterals filling the left anterior descending coronary artery from the right coronary circulation. Left ventricular function is at least moderately reduced. The patient was transferred [facility] for further management. Upon arrival the patient states that he has not had any chest discomfort today and his last episode of severe pain was several days ago.  Patient is married and lives [place] his [place] in [place]. 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 He does not exercise to any significant degree but he has not had any significant physical limitations prior to recently. He denies any history of PND, orthopnea, palpitations, or dizzy spells. He has mild chronic lower extremity edema. He only gets short of breath with more strenuous exertion, but he jokes that he avoids exertion as much as possible.    Discharged Condition: good  Hospital Course:  The patient was admitted in transfer as described above and on 04/14/2016 taken to the operating room where he underwent the below described procedure. He  tolerated well was taken to the surgical intensive care unit in stable condition  Postoperatively the patient has progressed well.He was weaned from the ventilator without significant difficulty. All routine lines, monitors and drainage devices have been discontinued in standard fashion. He has had some postoperative nausea. This persisted and became a prolonged postoperative ileus. General surgery consultation was obtained and assisted with management and he required several days of [name]. He also developed a significant cellulitis in his right lower extremity which has been very slow to progress. He was initially treated with intravenous azithromycin but this was discontinued and he started on a course of vancomycin and ultimately Primaxin was also added. Additionally, the leg was opened at the incision and a large amount of old hematoma was expressed. This will be transitioned to an oral antibiotic at time of discharge is felt to require further treatment. Dressing changes will be arranged  With home nursing.  He does have an expected acute blood loss anemia which is being followed clinically and will not require transfusion at this time. He does have some postoperative volume overload but is diuresing well. His blood sugars are under adequate control using usual measures.  His blood pressure is a little on the low side to be in ACE inhibitor or ARB as of yet.He does have some postoperative atelectasis which is improving with usual pulmonary measures. He is tolerating advancing activities using cardiac rehabilitation.  Consults: None  Significant Diagnostic Studies: routine post op labs /CXR  Treatments: surgery:  CARDIOTHORACIC SURGERY OPERATIVE NOTE  Date of Procedure:04/14/2016  Preoperative Diagnosis:  Severe Left Main and 3-vessel Coronary Artery Disease  Unstable Angina Pectoris  Postoperative Diagnosis:Same  Procedure:   Coronary Artery Bypass Grafting x  4 Left Internal Mammary Artery to Distal Left Anterior Descending Coronary Artery Saphenous Vein Graft to Posterior Descending Coronary Artery Saphenous Vein Graft to Obtuse Marginal Branch of Left Circumflex Coronary Artery Sapheonous Vein Graft to First Diagonal Branch Coronary Artery Endoscopic Vein Harvest from Right Thigh and Lower Leg  Surgeon:Clarence H. Cornelius Moraswen, MD  Assistant:Natalyn Szymanowski Clifton CustardE. Yehudis Monceaux, PA-C  Anesthesia:Terry Massagee, MD  Operative Findings:  Moderate LV systolic dysfunction  Good quality LIMA conduit for grafting  Good quality SVG conduit for grafting  Good quality target vessels for grafting Discharge Exam: Blood pressure 110/58, pulse 93, temperature 97.8 F (36.6 C), temperature source Oral, resp. rate 20, height 6\' 4"  (1.93 m), weight 274 lb 11.2 oz (124.603 kg), SpO2 94 %.  General appearance: alert, cooperative and no distress Heart: regular rate and rhythm Lungs: clear to auscultation bilaterally Abdomen: benign Extremities: edema conts to improve Wound: cellulitis conts to improve, incis draining old hematoma Disposition: home    Medication List    STOP taking these medications        isosorbide mononitrate 30 MG 24 hr tablet  Commonly known as:  IMDUR     nitroGLYCERIN 0.4 MG SL tablet  Commonly known as:  NITROSTAT      TAKE these medications        amiodarone 200 MG tablet  Commonly known as:  PACERONE  Take 1 tablet (200 mg total) by mouth daily.     atorvastatin 80 MG tablet  Commonly known as:  LIPITOR  Take 1 tablet (80 mg total) by mouth daily at 6 PM.     carvedilol 6.25 MG  tablet  Commonly known as:  COREG  Take 1 tablet (6.25 mg total) by mouth 2 (two) times daily with a meal.     ciprofloxacin 500 MG tablet  Commonly known as:  CIPRO  Take 1 tablet (500 mg total) by mouth 2 (two) times daily.     clopidogrel 75 MG tablet  Commonly known as:   PLAVIX  Take 1 tablet (75 mg total) by mouth daily.     furosemide 40 MG tablet  Commonly known as:  LASIX  Take 1 tablet (40 mg total) by mouth daily.     gemfibrozil 600 MG tablet  Commonly known as:  LOPID  Take 600 mg by mouth 2 (two) times daily before a meal.     glimepiride 4 MG tablet  Commonly known as:  AMARYL  Take 4 mg by mouth 2 (two) times daily. Reported on 04/13/2016     INVOKANA 300 MG Tabs tablet  Generic drug:  canagliflozin  Take 300 mg by mouth daily before breakfast. Reported on 04/13/2016     losartan-hydrochlorothiazide 100-25 MG tablet  Commonly known as:  HYZAAR  Take 0.5 tablets by mouth every evening.     metFORMIN 750 MG 24 hr tablet  Commonly known as:  GLUCOPHAGE-XR  Take 750 mg by mouth daily with breakfast. Reported on 04/13/2016     oxyCODONE 5 MG immediate release tablet  Commonly known as:  Oxy IR/ROXICODONE  Take 1 tablet (5 mg total) by mouth every 6 (six) hours as needed for severe pain.     potassium chloride SA 20 MEQ tablet  Commonly known as:  K-DUR,KLOR-CON  Take 1 tablet (20 mEq total) by mouth daily.       Follow-up Information    Follow up with [name], MD.   Specialty:  Internal Medicine   Why:  call for 2 week appointment with cardiologist   Contact information:   [address] [place] [address] [phone]       Follow up with Purcell Nails, MD.   Specialty:  Cardiothoracic Surgery   Why:  05/25/2016 at 12 noon to see the surgeon. Please obtain a chest x-ray at 11 AM at [facility] imaging which is located in the same office complex.   Contact information:   [address] [employer] [address] [place] [address] [phone]      The patient has been discharged on:   1.Beta Blocker:  Yes [place]   ]                              No   [   ]                              If No, reason:  2.Ace Inhibitor/ARB: Yes [   ]                                     No  [  y ]                                      If No, reason:  3.Statin:   Yes [ y ]  No  [   ]                  If No, reason:  4.Ecasa:  Yes  [   ]                  No   [ n  ]                  If No, reason:allergy- anaphylaxis, on plavix  Signed: Lenee Franze E 05/06/2016, 7:51 AM

## 2016-04-17 NOTE — Progress Notes (Signed)
[address]       [place] [id number]             [phone]      3 Days Post-Op Procedure(s) (LRB): CORONARY ARTERY BYPASS GRAFTING TIMES FOUR    USING LEFT INTERNAL MAMMARY AND ENDOSCOPIC HARVEST RIGHT SAPHENOUS VEIN (N/A) INTRAOPERATIVE TRANSESOPHAGEAL ECHOCARDIOGRAM (N/A) Subjective: Very nauseated currently, + Flatus, no abdominal pain  Objective: Vital signs in last 24 hours: Temp:  [97.9 F (36.6 C)-98.9 F (37.2 C)] 97.9 F (36.6 C) (06/23 0700) Pulse Rate:  [103-110] 104 (06/23 0700) Cardiac Rhythm:  [-] Sinus tachycardia (06/23 0754) Resp:  [20-26] 20 (06/23 0700) BP: (115-138)/(52-84) 128/63 mmHg (06/23 0700) SpO2:  [92 %-95 %] 93 % (06/23 0700) Weight:  [291 lb 6.4 oz (132.178 kg)] 291 lb 6.4 oz (132.178 kg) (06/23 0413)  Hemodynamic parameters for last 24 hours:    Intake/Output from previous day: 06/22 0701 - 06/23 0700 In: 263 [P.O.:240; I.V.:23] Out: 1805 [Urine:1805] Intake/Output this shift:    General appearance: alert, cooperative and no distress Heart: regular rate and rhythm Lungs: mildly dim in bases Abdomen: + BS, + distension, non-tender Extremities: + LE edema Wound: incis healing well  Lab Results:  Recent Labs  04/16/16 0446 04/17/16 0400  WBC 11.7* 12.0*  HGB 8.8* 8.4*  HCT 26.7* 24.8*  PLT 128* 145*   BMET:  Recent Labs  04/16/16 0446 04/17/16 0400  NA 132* 132*  K 4.2 3.9  CL 99* 98*  CO2 28 29  GLUCOSE 172* 185*  BUN 14 20  CREATININE 1.19 1.32*  CALCIUM 8.5* 8.7*    PT/INR:  Recent Labs  04/14/16 1635  LABPROT 18.0*  INR 1.48   ABG    Component Value Date/Time   PHART 7.328* 04/14/2016 2006   HCO3 25.0* 04/14/2016 2006   TCO2 26 04/15/2016 1707   ACIDBASEDEF 1.0 04/14/2016 2006   O2SAT 91.0 04/14/2016 2006   CBG (last 3)   Recent Labs  04/16/16 1617 04/16/16 2038 04/17/16 0554  GLUCAP 257* 262* 210*    Meds Scheduled Meds: . acetaminophen  1,000 mg Oral Q6H  .  atorvastatin  80 mg Oral q1800  . bisacodyl  10 mg Oral Daily   Or  . bisacodyl  10 mg Rectal Daily  . carvedilol  12.5 mg Oral BID WC  . clopidogrel  75 mg Oral Daily  . docusate sodium  200 mg Oral Daily  . enoxaparin (LOVENOX) injection  40 mg Subcutaneous Q24H  . furosemide  40 mg Oral BID  . gemfibrozil  600 mg Oral BID AC  . insulin aspart  0-24 Units Subcutaneous TID AC & HS  . insulin detemir  20 Units Subcutaneous BID  . pantoprazole  40 mg Oral Daily  . potassium chloride  20 mEq Oral BID  . sodium chloride flush  3 mL Intravenous Q12H  . sodium chloride flush  3 mL Intravenous Q12H   Continuous Infusions: . sodium chloride Stopped (04/16/16 0941)   PRN Meds:.sodium chloride, metoprolol, morphine injection, ondansetron (ZOFRAN) IV, oxyCODONE, sodium chloride flush, sodium chloride flush, traMADol  Xrays Dg Chest Port 1 View  04/16/2016  CLINICAL DATA:  Cough. EXAM: PORTABLE CHEST 1 VIEW COMPARISON:  04/15/2016. FINDINGS: Right IJ sheath in stable position. Interim removal of Swan-Ganz catheter, mediastinal drainage catheter, left chest tube. Low lung volumes with bibasilar atelectasis and/or infiltrates/edema. Mild right upper lobe infiltrate cannot be excluded . Small left pleural effusion. IMPRESSION:  1. Interim removal Swan-Ganz catheter, mediastinal drainage catheter, left chest tube. Right IJ sheath in stable position. No pneumothorax. 2. Low lung volumes with bibasilar atelectasis and/or infiltrates/edema. Mild right upper lobe infiltrate cannot be excluded. Small left pleural effusion. Electronically Signed   By: Maisie Fushomas  Register   On: 04/16/2016 07:44    Assessment/Plan: S/P Procedure(s) (LRB): CORONARY ARTERY BYPASS GRAFTING TIMES FOUR    USING LEFT INTERNAL MAMMARY AND ENDOSCOPIC HARVEST RIGHT SAPHENOUS VEIN (N/A) INTRAOPERATIVE TRANSESOPHAGEAL ECHOCARDIOGRAM (N/A)  1 nausea- may have degree of ileus/ diabetic gastroparesis but good BS and passing flatus, will add  lactulose and a couple doses of reglan 2 ABL anemia- a bit lower, follow clinically 3 volume overload- cont diuresis 4 sugars elevated, cont insulin and not restart po meds till able to take po after nausea resolved 5 creat trending up a little, bp soft, hold on ARB restart for now 6 pulm toilet/cardiac rehab as able  LOS: 4 days    Jason [name] E 04/17/2016

## 2016-04-17 NOTE — Progress Notes (Signed)
Pt refused to ambulate tonight, when asked.  Will try again in AM.  Pt resting in bed with call bell in reach.  Will continue to monitor pt. Harriet Massonavidson, Kamylah Manzo E, RN

## 2016-04-17 NOTE — Progress Notes (Signed)
Patient came back from Xray feeling nauseous. Upon assessment patient was diaphoretic. He states pain has extended to his abdomen. VSS. Zofran given. PA notfied. Call bell within reach. Will continue to monitor.   Valinda HoarLexie Altus Zaino RN

## 2016-04-18 ENCOUNTER — Encounter (HOSPITAL_COMMUNITY): Payer: Self-pay

## 2016-04-18 LAB — CBC
HEMATOCRIT: 27.4 % — AB (ref 39.0–52.0)
HEMOGLOBIN: 9.4 g/dL — AB (ref 13.0–17.0)
MCH: 31 pg (ref 26.0–34.0)
MCHC: 34.3 g/dL (ref 30.0–36.0)
MCV: 90.4 fL (ref 78.0–100.0)
Platelets: 244 10*3/uL (ref 150–400)
RBC: 3.03 MIL/uL — AB (ref 4.22–5.81)
RDW: 13.4 % (ref 11.5–15.5)
WBC: 14.9 10*3/uL — ABNORMAL HIGH (ref 4.0–10.5)

## 2016-04-18 LAB — BASIC METABOLIC PANEL
Anion gap: 9 (ref 5–15)
BUN: 28 mg/dL — ABNORMAL HIGH (ref 6–20)
CHLORIDE: 96 mmol/L — AB (ref 101–111)
CO2: 26 mmol/L (ref 22–32)
CREATININE: 1.29 mg/dL — AB (ref 0.61–1.24)
Calcium: 9.2 mg/dL (ref 8.9–10.3)
GFR calc non Af Amer: 56 mL/min — ABNORMAL LOW (ref >60)
Glucose, Bld: 208 mg/dL — ABNORMAL HIGH (ref 65–99)
POTASSIUM: 4.4 mmol/L (ref 3.5–5.1)
Sodium: 131 mmol/L — ABNORMAL LOW (ref 135–145)

## 2016-04-18 LAB — GLUCOSE, CAPILLARY
GLUCOSE-CAPILLARY: 233 mg/dL — AB (ref 65–99)
GLUCOSE-CAPILLARY: 275 mg/dL — AB (ref 65–99)
GLUCOSE-CAPILLARY: 197 mg/dL — AB (ref 65–99)
GLUCOSE-CAPILLARY: 236 mg/dL — AB (ref 65–99)

## 2016-04-18 MED ORDER — INSULIN DETEMIR 100 UNIT/ML ~~LOC~~ SOLN
24.0000 [IU] | Freq: Two times a day (BID) | SUBCUTANEOUS | Status: DC
  Administered 2016-04-18 (×2): 24 [IU] via SUBCUTANEOUS
  Filled 2016-04-18 (×4): qty 0.24

## 2016-04-18 MED ORDER — METOCLOPRAMIDE HCL 5 MG/ML IJ SOLN
10.0000 mg | Freq: Four times a day (QID) | INTRAMUSCULAR | Status: AC
  Administered 2016-04-18 – 2016-04-19 (×4): 10 mg via INTRAVENOUS
  Filled 2016-04-18 (×5): qty 2

## 2016-04-18 NOTE — Progress Notes (Signed)
[id number] Cardiac Rehab Pt has ambulated X 2 today with his nurse !50 feet with both walks. He states that he has felt miserable today. He is nauseated and has not had a bowel movement. Pt's wife in his room. She is concerned that she is not going to be able to care for him at home. [facility] and [facility]. Discussed Outpt. CRP with them and will [facility].

## 2016-04-18 NOTE — Progress Notes (Signed)
Pacing wires removed. Pt tolerated well. Pt VSS, call light within reach, will continue to monitor. Patient instructed to remain on bedrest until 11:38am.  Leonidas Rombergaitlin S Bumbledare, RN

## 2016-04-18 NOTE — Progress Notes (Addendum)
      [address]       [place] [id number]             [phone]        4 Days Post-Op Procedure(s) (LRB): CORONARY ARTERY BYPASS GRAFTING TIMES FOUR    USING LEFT INTERNAL MAMMARY AND ENDOSCOPIC HARVEST RIGHT SAPHENOUS VEIN (N/A) INTRAOPERATIVE TRANSESOPHAGEAL ECHOCARDIOGRAM (N/A)  Subjective: Patient not able to sleep well. Still with occasional nausea but no emesis. Passing flatus but no bowel movement.  Objective: Vital signs in last 24 hours: Temp:  [97.5 F (36.4 C)-97.9 F (36.6 C)] 97.9 F (36.6 C) (06/24 0328) Pulse Rate:  [103-108] 103 (06/24 0328) Cardiac Rhythm:  [-] Sinus tachycardia (06/24 0713) Resp:  [16-18] 18 (06/24 0328) BP: (112-128)/(65-78) 128/72 mmHg (06/24 0328) SpO2:  [91 %-92 %] 92 % (06/24 0328) Weight:  [290 lb 11.2 oz (131.861 kg)] 290 lb 11.2 oz (131.861 kg) (06/24 0323)  Pre op weight 126 kg Current Weight  04/18/16 290 lb 11.2 oz (131.861 kg)      Intake/Output from previous day: 06/23 0701 - 06/24 0700 In: 395 [P.O.:395] Out: 1250 [Urine:1250]   Physical Exam:  Cardiovascular: Slightly tachy Pulmonary: Diminished at bases Abdomen: Soft, non tender, protuberant,bowel sounds present. Extremities: Bilateral lower extremity edema. Ecchymosis RLE and small hematoma at [facility] site. Wounds: Clean and dry.  No erythema or signs of infection.  Lab Results: CBC: Recent Labs  04/17/16 0400 04/18/16 0357  WBC 12.0* 14.9*  HGB 8.4* 9.4*  HCT 24.8* 27.4*  PLT 145* 244   BMET:  Recent Labs  04/17/16 0400 04/18/16 0357  NA 132* 131*  K 3.9 4.4  CL 98* 96*  CO2 29 26  GLUCOSE 185* 208*  BUN 20 28*  CREATININE 1.32* 1.29*  CALCIUM 8.7* 9.2    PT/INR:  Lab Results  Component Value Date   INR 1.48 04/14/2016   INR 1.17 04/13/2016   ABG:  INR: Will add last result for INR, ABG once components are confirmed Will add last 4 CBG results once components are confirmed  Assessment/Plan:  1. CV - ST in the  low 100's. On Coreg 12.5 mg bid 2.  Pulmonary - On room air  3. Volume Overload - On Lasix 40 mg bid 4.  Acute blood loss anemia - H and H increased to 9.4 and 27.4 5. Creatinine slightly decreased from 1.32 to 1.29. NOT on ACE 6. DM-CBGs 230/192/233. On Insulin only and will increase for better glucose control. Will restart oral medications at discharge. Pre op HGA1C 7.6. Will need medical follow up after discharge 7. Remove EPW 8. Will give Reglan and LOC for constipation  ZIMMERMAN,DONIELLE MPA-C 04/18/2016,8:50 AM  I have seen and examined Jason AnisJohn C [name] and agree with the above assessment  and plan.  Delight OvensEdward B Mazell Aylesworth MD [place] [phone] 04/18/2016 9:40 AM

## 2016-04-19 ENCOUNTER — Inpatient Hospital Stay (HOSPITAL_COMMUNITY): Payer: Medicare Other

## 2016-04-19 LAB — COMPREHENSIVE METABOLIC PANEL
ALT: 15 U/L — ABNORMAL LOW (ref 17–63)
AST: 21 U/L (ref 15–41)
Albumin: 2.7 g/dL — ABNORMAL LOW (ref 3.5–5.0)
Alkaline Phosphatase: 41 U/L (ref 38–126)
Anion gap: 8 (ref 5–15)
BUN: 32 mg/dL — ABNORMAL HIGH (ref 6–20)
CO2: 30 mmol/L (ref 22–32)
Calcium: 9 mg/dL (ref 8.9–10.3)
Chloride: 95 mmol/L — ABNORMAL LOW (ref 101–111)
Creatinine, Ser: 1.3 mg/dL — ABNORMAL HIGH (ref 0.61–1.24)
GFR calc Af Amer: >60 mL/min (ref >60)
GFR calc non Af Amer: 56 mL/min — ABNORMAL LOW (ref >60)
Glucose, Bld: 126 mg/dL — ABNORMAL HIGH (ref 65–99)
Potassium: 3.4 mmol/L — ABNORMAL LOW (ref 3.5–5.1)
Sodium: 133 mmol/L — ABNORMAL LOW (ref 135–145)
Total Bilirubin: 1.3 mg/dL — ABNORMAL HIGH (ref 0.3–1.2)
Total Protein: 5.9 g/dL — ABNORMAL LOW (ref 6.5–8.1)

## 2016-04-19 LAB — GLUCOSE, CAPILLARY
GLUCOSE-CAPILLARY: 158 mg/dL — AB (ref 65–99)
Glucose-Capillary: 145 mg/dL — ABNORMAL HIGH (ref 65–99)
Glucose-Capillary: 133 mg/dL — ABNORMAL HIGH (ref 65–99)
Glucose-Capillary: 155 mg/dL — ABNORMAL HIGH (ref 65–99)

## 2016-04-19 LAB — CBC
HCT: 26.3 % — ABNORMAL LOW (ref 39.0–52.0)
Hemoglobin: 8.6 g/dL — ABNORMAL LOW (ref 13.0–17.0)
MCH: 29.2 pg (ref 26.0–34.0)
MCHC: 32.7 g/dL (ref 30.0–36.0)
MCV: 89.2 fL (ref 78.0–100.0)
Platelets: 240 10*3/uL (ref 150–400)
RBC: 2.95 MIL/uL — ABNORMAL LOW (ref 4.22–5.81)
RDW: 13.5 % (ref 11.5–15.5)
WBC: 8.7 10*3/uL (ref 4.0–10.5)

## 2016-04-19 LAB — LACTIC ACID, PLASMA
Lactic Acid, Venous: 1.3 mmol/L (ref 0.5–2.0)
Lactic Acid, Venous: 1.4 mmol/L (ref 0.5–2.0)

## 2016-04-19 LAB — LIPASE, BLOOD: Lipase: 36 U/L (ref 11–51)

## 2016-04-19 LAB — AMYLASE: Amylase: 33 U/L (ref 28–100)

## 2016-04-19 MED ORDER — FLEET ENEMA 7-19 GM/118ML RE ENEM
1.0000 | ENEMA | Freq: Once | RECTAL | Status: DC
  Filled 2016-04-19: qty 1

## 2016-04-19 MED ORDER — METOCLOPRAMIDE HCL 5 MG/ML IJ SOLN
10.0000 mg | Freq: Four times a day (QID) | INTRAMUSCULAR | Status: AC
  Administered 2016-04-19 – 2016-04-20 (×3): 10 mg via INTRAVENOUS
  Filled 2016-04-19 (×3): qty 2

## 2016-04-19 MED ORDER — METOCLOPRAMIDE HCL 5 MG/ML IJ SOLN
10.0000 mg | Freq: Four times a day (QID) | INTRAMUSCULAR | Status: DC
  Administered 2016-04-19: 10 mg via INTRAVENOUS
  Filled 2016-04-19: qty 2

## 2016-04-19 MED ORDER — FAMOTIDINE IN NACL 20-0.9 MG/50ML-% IV SOLN
20.0000 mg | Freq: Two times a day (BID) | INTRAVENOUS | Status: DC
  Administered 2016-04-19 – 2016-04-22 (×7): 20 mg via INTRAVENOUS
  Filled 2016-04-19 (×9): qty 50

## 2016-04-19 MED ORDER — INSULIN DETEMIR 100 UNIT/ML ~~LOC~~ SOLN
28.0000 [IU] | Freq: Two times a day (BID) | SUBCUTANEOUS | Status: DC
  Administered 2016-04-19 (×2): 28 [IU] via SUBCUTANEOUS
  Filled 2016-04-19 (×4): qty 0.28

## 2016-04-19 MED ORDER — KCL IN DEXTROSE-NACL 20-5-0.45 MEQ/L-%-% IV SOLN
INTRAVENOUS | Status: DC
  Administered 2016-04-19 – 2016-04-21 (×5): via INTRAVENOUS
  Filled 2016-04-19 (×6): qty 1000

## 2016-04-19 MED ORDER — SODIUM CHLORIDE 0.9 % IV SOLN: 250.0000 mL | INTRAVENOUS | Status: DC | PRN

## 2016-04-19 NOTE — Progress Notes (Addendum)
Abdominal xray showed 'moderate adynamic ileus in post-operative setting'. PA made aware. Given verbal order for 10mg  IV Reglan q6h for 4 doses. Given verbal order to discontinue Fleet enema order. Pt made NPO with sips & chips.  Educated patient and his wife at bedside on Reglan as well as NPO. Pt and wife agreeable. Pt complains of pressure-like pain in stomach, but does not want medication due to nausea. Educated patient and his wife on the importance of ambulation. Pt sitting on edge of bed, states he will try to walk when nausea decreases. Given 1300 dose of Reglan. Family requested to speak with PA, paged PA.  Call light within reach, will continue to monitor.   Leonidas Rombergaitlin S Bumbledare, RN

## 2016-04-19 NOTE — Progress Notes (Signed)
Pt complains of persistent nausea and increasing pressure-like pain in abdomen. Given 2mg  of morphine as pt unable to tolerate PO. Pt wife at bedside - educated on importance of ambulation.   Spoke with Dr. Tyrone SageGerhardt. Educated pt and wife on course of care: scheduled Reglan, IVF, and IV pepcid. Will continue to monitor.   Leonidas Rombergaitlin S Bumbledare, RN

## 2016-04-19 NOTE — Progress Notes (Addendum)
      [address]       [place] [id number]             [phone]        5 Days Post-Op Procedure(s) (LRB): CORONARY ARTERY BYPASS GRAFTING TIMES FOUR    USING LEFT INTERNAL MAMMARY AND ENDOSCOPIC HARVEST RIGHT SAPHENOUS VEIN (N/A) INTRAOPERATIVE TRANSESOPHAGEAL ECHOCARDIOGRAM (N/A)  Subjective: Despite multiple laxative, still no bowel movement. Has intermittent nausea secondary to constipation.  Objective: Vital signs in last 24 hours: Temp:  [98.4 F (36.9 C)-98.9 F (37.2 C)] 98.6 F (37 C) (06/25 0446) Pulse Rate:  [97-120] 100 (06/25 0851) Cardiac Rhythm:  [-] Sinus tachycardia (06/25 0713) Resp:  [17-18] 18 (06/25 0446) BP: (94-137)/(62-75) 113/68 mmHg (06/25 0851) SpO2:  [93 %-100 %] 93 % (06/25 0446) Weight:  [284 lb (128.822 kg)] 284 lb (128.822 kg) (06/25 0446)  Pre op weight 126 kg Current Weight  04/19/16 284 lb (128.822 kg)      Intake/Output from previous day: 06/24 0701 - 06/25 0700 In: 360 [P.O.:360] Out: 1825 [Urine:1825]   Physical Exam:  Cardiovascular: RRR Pulmonary: Diminished at bases Abdomen: Soft, non tender, protuberant,bowel sounds present. Extremities: Bilateral lower extremity edema. Ecchymosis RLE and small hematoma at [facility] site and distally. Wounds: Clean and dry.  No erythema or signs of infection.  Lab Results: CBC:  Recent Labs  04/17/16 0400 04/18/16 0357  WBC 12.0* 14.9*  HGB 8.4* 9.4*  HCT 24.8* 27.4*  PLT 145* 244   BMET:   Recent Labs  04/17/16 0400 04/18/16 0357  NA 132* 131*  K 3.9 4.4  CL 98* 96*  CO2 29 26  GLUCOSE 185* 208*  BUN 20 28*  CREATININE 1.32* 1.29*  CALCIUM 8.7* 9.2    PT/INR:  Lab Results  Component Value Date   INR 1.48 04/14/2016   INR 1.17 04/13/2016   ABG:  INR: Will add last result for INR, ABG once components are confirmed Will add last 4 CBG results once components are confirmed  Assessment/Plan:  1. CV - ST in the low 90's. On Coreg 12.5 mg bid  and Plavix 75 mg daily. 2.  Pulmonary - On room air  3. Volume Overload - On Lasix 40 mg bid 4.  Acute blood loss anemia - H and H increased to 9.4 and 27.4 5. Creatinine slightly decreased from 1.32 to 1.29. NOT on ACE 6. DM-CBGs 197/236/145. On Insulin only . Will restart oral medications at discharge. Pre op HGA1C 7.6. Will need medical follow up after discharge 7. Fleets enema for constipation  ZIMMERMAN,DONIELLE MPA-C 04/19/2016,8:54 AM  Nausea today Abdomen more distended, no rebound ,  high pitch bowel sounds  Will check labs including lactate, amylase lipase  Abdominal films , change to sips and chips  I have seen and examined Jason AnisJohn C [name] and agree with the above assessment  and plan.  Delight OvensEdward B Caeley Dohrmann MD Beeper [phone] Office [phone] 04/19/2016 10:05 AM

## 2016-04-20 ENCOUNTER — Inpatient Hospital Stay (HOSPITAL_COMMUNITY): Payer: Medicare Other

## 2016-04-20 LAB — BASIC METABOLIC PANEL
Anion gap: 9 (ref 5–15)
BUN: 34 mg/dL — AB (ref 6–20)
CALCIUM: 9.1 mg/dL (ref 8.9–10.3)
CO2: 31 mmol/L (ref 22–32)
CREATININE: 1.48 mg/dL — AB (ref 0.61–1.24)
Chloride: 95 mmol/L — ABNORMAL LOW (ref 101–111)
GFR, EST AFRICAN AMERICAN: 55 mL/min — AB (ref >60)
GFR, EST NON AFRICAN AMERICAN: 48 mL/min — AB (ref >60)
Glucose, Bld: 143 mg/dL — ABNORMAL HIGH (ref 65–99)
Potassium: 3.6 mmol/L (ref 3.5–5.1)
SODIUM: 135 mmol/L (ref 135–145)

## 2016-04-20 LAB — CBC
HCT: 27.2 % — ABNORMAL LOW (ref 39.0–52.0)
Hemoglobin: 8.8 g/dL — ABNORMAL LOW (ref 13.0–17.0)
MCH: 29.1 pg (ref 26.0–34.0)
MCHC: 32.4 g/dL (ref 30.0–36.0)
MCV: 90.1 fL (ref 78.0–100.0)
Platelets: 245 10*3/uL (ref 150–400)
RBC: 3.02 MIL/uL — ABNORMAL LOW (ref 4.22–5.81)
RDW: 13.7 % (ref 11.5–15.5)
WBC: 7.4 10*3/uL (ref 4.0–10.5)

## 2016-04-20 LAB — GLUCOSE, CAPILLARY
GLUCOSE-CAPILLARY: 134 mg/dL — AB (ref 65–99)
GLUCOSE-CAPILLARY: 139 mg/dL — AB (ref 65–99)
GLUCOSE-CAPILLARY: 172 mg/dL — AB (ref 65–99)
Glucose-Capillary: 182 mg/dL — ABNORMAL HIGH (ref 65–99)

## 2016-04-20 MED ORDER — OXYCODONE HCL 5 MG PO TABS
5.0000 mg | ORAL_TABLET | ORAL | Status: DC | PRN
  Administered 2016-04-25 – 2016-05-06 (×15): 5 mg via ORAL
  Filled 2016-04-20 (×17): qty 1

## 2016-04-20 MED ORDER — LORAZEPAM 2 MG/ML IJ SOLN
1.0000 mg | Freq: Every evening | INTRAMUSCULAR | Status: DC | PRN
Start: 2016-04-20 — End: 2016-04-27
  Administered 2016-04-23 – 2016-04-26 (×5): 1 mg via INTRAVENOUS
  Filled 2016-04-20 (×5): qty 1

## 2016-04-20 MED ORDER — INSULIN DETEMIR 100 UNIT/ML ~~LOC~~ SOLN: 24.0000 [IU] | Freq: Two times a day (BID) | SUBCUTANEOUS | Status: DC

## 2016-04-20 MED ORDER — PHENOL 1.4 % MT LIQD
1.0000 | OROMUCOSAL | Status: DC | PRN
Start: 2016-04-20 — End: 2016-05-06
  Administered 2016-04-20: 1 via OROMUCOSAL
  Filled 2016-04-20: qty 177

## 2016-04-20 MED ORDER — BIOTENE DRY MOUTH MT LIQD: 15.0000 mL | OROMUCOSAL | Status: DC | PRN

## 2016-04-20 NOTE — Progress Notes (Addendum)
      [address]       [place] [id number]             [phone]        6 Days Post-Op Procedure(s) (LRB): CORONARY ARTERY BYPASS GRAFTING TIMES FOUR    USING LEFT INTERNAL MAMMARY AND ENDOSCOPIC HARVEST RIGHT SAPHENOUS VEIN (N/A) INTRAOPERATIVE TRANSESOPHAGEAL ECHOCARDIOGRAM (N/A)  Subjective: Patient with nausea and abdominal distention. Not sleeping either.  Objective: Vital signs in last 24 hours: Temp:  [97.3 F (36.3 C)-99.2 F (37.3 C)] 98.6 F (37 C) (06/26 0535) Pulse Rate:  [62-100] 98 (06/26 0535) Cardiac Rhythm:  [-] Normal sinus rhythm;Bundle branch block (06/26 0700) Resp:  [17-19] 17 (06/26 0535) BP: (108-157)/(60-72) 108/64 mmHg (06/26 0535) SpO2:  [94 %-100 %] 94 % (06/26 0535) Weight:  [283 lb 3.2 oz (128.459 kg)] 283 lb 3.2 oz (128.459 kg) (06/26 0535)  Pre op weight 126 kg Current Weight  04/20/16 283 lb 3.2 oz (128.459 kg)      Intake/Output from previous day: 06/25 0701 - 06/26 0700 In: 240 [P.O.:240] Out: 950 [Urine:950]   Physical Exam:  Cardiovascular: RRR Pulmonary: Diminished at bases Abdomen: Soft, non tender, protuberant,bowel sounds present. Extremities: Bilateral lower extremity edema. Ecchymosis RLE and small hematoma at [facility] site and distally. Wounds: Clean and dry.  No erythema or signs of infection.  Lab Results: CBC:  Recent Labs  04/19/16 1041 04/20/16 0218  WBC 8.7 7.4  HGB 8.6* 8.8*  HCT 26.3* 27.2*  PLT 240 245   BMET:   Recent Labs  04/19/16 1041 04/20/16 0218  NA 133* 135  K 3.4* 3.6  CL 95* 95*  CO2 30 31  GLUCOSE 126* 143*  BUN 32* 34*  CREATININE 1.30* 1.48*  CALCIUM 9.0 9.1    PT/INR:  Lab Results  Component Value Date   INR 1.48 04/14/2016   INR 1.17 04/13/2016   ABG:  INR: Will add last result for INR, ABG once components are confirmed Will add last 4 CBG results once components are confirmed  Assessment/Plan:  1. CV - ST in the low 90's. On Coreg 12.5 mg bid  and Plavix 75 mg daily. 2.  Pulmonary - On room air. Encourage incentive spirometer 3. ABL anemia-H and H stable at 8.8 and 27.2 4.  Acute blood loss anemia - H and H increased to 9.4 and 27.4 5. Creatinine slightly increased from 1.3 to 1.48. NOT on ACE but is being diuresed. Will stop Lasix for now. 6. DM-CBGs 155/134/139 . On scheduled Insulin only. Will stop fo now as NPO. Will restart oral medications at discharge. Pre op HGA1C 7.6. Will need medical follow up after discharge 7. GI-post op ileus vs distal sbo.  NPO  NGT to be placed and general surgery consult to be obtained. Limit narcotics as much as possible  ZIMMERMAN,DONIELLE MPA-C 04/20/2016,11:33 AM  Abdominal films today no better then yesterday, ng placed and 900 ml out  Asked General Surgery to see  I have seen and examined Jason AnisJohn C [name] and agree with the above assessment  and plan.  Delight OvensEdward B Reese Senk MD Beeper [phone] Office [phone] 04/20/2016 1:30 PM

## 2016-04-20 NOTE — Progress Notes (Signed)
Utilization review completed.  

## 2016-04-20 NOTE — Progress Notes (Signed)
Paged Dr. Tyrone SageGerhardt with results of abdominal x-ray. Received call back from OR. Given verbal order by Dr. Tyrone SageGerhardt to place NG tube to low constant suction.   Leonidas Rombergaitlin S Bumbledare, RN

## 2016-04-20 NOTE — Progress Notes (Signed)
Pt converted to a-fib, HR non sustaining 110s-130s , MD Donata ClayVan Trigt paged x2. Awaiting call back. Pt is resting. No new complaints. Will continue to monitor.   Bracken Moffa, RN

## 2016-04-20 NOTE — Care Management Important Message (Signed)
Important Message  Patient Details  Name: Jason [name] MRN: [id number] Date of Birth: 03/08/1951   Medicare Important Message Given:  Yes    Kyla BalzarineShealy, Genea Rheaume [name] 04/20/2016, 10:38 AM

## 2016-04-20 NOTE — Consult Note (Signed)
[facility] Surgery Consult Note  Jason [name] 1951/03/21  [id number].    Requesting MD: Dr. Servando Snare Chief Complaint/Reason for Consult:  HPI:  65 y/o male POD#6 CABGx4 by Dr. Servando Snare. PMH HTN, HLD, DMII. Wife in the room during my exam. Pt reports nausea that began POD#1 and has been persistent. Reports epigastric abdominal pain that is constant and non-radiating. Denies vomiting. Does report 1-2 episodes of flatus around POD#3 but "not much". No BM's since admission, despite treatment with stool softeners and laxatives. Pt was given scheduled antiemetics which failed to control his nausea. Abd films revealed moderately dilated small bowel loops with air in R colon and general surgery asked to consult. NG placed in the room with >850 cc of light brown output within minutes. Pt is mobilizing with walker. Denies SOB, hematemesis, urinary sxs, and weakness.  Past abdominal surgeries: laparoscopic cholecystectomy Blood thinning medications: Plavix  Allergies below: Allergies  Allergen Reactions  . Aspirin Anaphylaxis  . Inderal [Propranolol]     Other reaction(s): Other (See Comments) Fatigue  . Lovastatin     Other reaction(s): Muscle Pain  . Metformin Hcl Diarrhea  . Penicillin G Hives  . Pravastatin Sodium     Other reaction(s): Muscle Pain  . Simvastatin     Other reaction(s): Muscle Pain  . Penicillins Rash    Has patient had a PCN reaction causing immediate rash, facial/tongue/throat swelling, SOB or lightheadedness with hypotension: Yes Has patient had a PCN reaction causing severe rash involving mucus membranes or skin necrosis: Yes Has patient had a PCN reaction that required hospitalization No Has patient had a PCN reaction occurring within the last 10 years: Yes If all of the above answers are "NO", then may proceed with Cephalosporin use.     ROS: All systems reviewed and otherwise negative except for as above  History reviewed. No pertinent family  history.  Past Medical History  Diagnosis Date  . Hypertension   . Diabetes mellitus without complication ([place])   . GERD (gastroesophageal reflux disease)   . Arthritis   . Cough     CHRONIC  . Edema     FEET/LEGS  . 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 Unstable angina ([place]) 04/13/2016  . Coronary artery disease involving native coronary artery with unstable angina pectoris ([place]) 04/13/2016  . Type II diabetes mellitus ([place])   . Essential hypertension   . Mixed hyperlipidemia   . Obesity   . 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 Cholecystectomy    . Back surgery      [facility], [place]  . Eye surgery Left     Cataract Extraction with IOL implant  . Knee arthroscopy Left 10/08/2015    Procedure: ARTHROSCOPY KNEE, PARTIAL MEDIAL AND LATERAL MENISECTOMY, REMOVAL OF FOREIGN BODY;  Surgeon: Hessie Knows, MD;  Location: [facility] ORS;  Service: Orthopedics;  Laterality: Left;  . Cataract extraction w/phaco Right 01/23/2016    Procedure: CATARACT EXTRACTION PHACO AND INTRAOCULAR LENS PLACEMENT (IOC);  Surgeon: Birder Robson, MD;  Location: [facility] ORS;  Service: Ophthalmology;  Laterality: Right;  [place] 01:18 AP% 23.8 CDE 18.67 fluid pack lot # [id number]  . Cardiac catheterization Left 04/13/2016    Procedure: Left Heart Cath and Coronary Angiography;  Surgeon: Corey Skains, MD;  Location: [place] [facility];  Service: Cardiovascular;  Laterality: Left;  . Coronary artery bypass graft N/A 04/14/2016    Procedure: CORONARY ARTERY BYPASS GRAFTING TIMES FOUR    USING LEFT INTERNAL MAMMARY AND ENDOSCOPIC HARVEST RIGHT SAPHENOUS VEIN;  Surgeon: Rexene Alberts, MD;  Location: [place];  Service: Open Heart Surgery;  Laterality: N/A;  . Intraoperative transesophageal echocardiogram N/A 04/14/2016    Procedure: INTRAOPERATIVE TRANSESOPHAGEAL ECHOCARDIOGRAM;  Surgeon:  Rexene Alberts, MD;  Location: [place];  Service: Open Heart Surgery;  Laterality: N/A;    Social History:  reports that he has never smoked. His smokeless tobacco use includes Chew. He reports that he does not drink alcohol or use illicit drugs.  Allergies:  Allergies  Allergen Reactions  . Aspirin Anaphylaxis  . Inderal [Propranolol]     Other reaction(s): Other (See Comments) Fatigue  . Lovastatin     Other reaction(s): Muscle Pain  . Metformin Hcl Diarrhea  . Penicillin G Hives  . Pravastatin Sodium     Other reaction(s): Muscle Pain  . Simvastatin     Other reaction(s): Muscle Pain  . Penicillins Rash    Has patient had a PCN reaction causing immediate rash, facial/tongue/throat swelling, SOB or lightheadedness with hypotension: Yes Has patient had a PCN reaction causing severe rash involving mucus membranes or skin necrosis: Yes Has patient had a PCN reaction that required hospitalization No Has patient had a PCN reaction occurring within the last 10 years: Yes If all of the above answers are "NO", then may proceed with Cephalosporin use.     Medications Prior to Admission  Medication Sig Dispense Refill  . carvedilol (COREG) 12.5 MG tablet Take 12.5 mg by mouth 2 (two) times daily with a meal.    . gemfibrozil (LOPID) 600 MG tablet Take 600 mg by mouth 2 (two) times daily before a meal.    . losartan-hydrochlorothiazide (HYZAAR) 100-25 MG tablet Take 1 tablet by mouth every evening.     . canagliflozin (INVOKANA) 300 MG TABS tablet Take 300 mg by mouth daily before breakfast. Reported on 04/13/2016    . glimepiride (AMARYL) 4 MG tablet Take 4 mg by mouth 2 (two) times daily. Reported on 04/13/2016    . isosorbide mononitrate (IMDUR) 30 MG 24 hr tablet Take 1 tablet (30 mg total) by mouth daily. (Patient not taking: Reported on 04/13/2016) 30 tablet 4  . metFORMIN (GLUCOPHAGE-XR) 750 MG 24 hr tablet Take 750 mg by mouth daily with breakfast. Reported on 04/13/2016    .  nitroGLYCERIN (NITROSTAT) 0.4 MG SL tablet Place 0.4 mg under the tongue as needed.      Blood pressure 108/64, pulse 98, temperature 98.6 F (37 C), temperature source Oral, resp. rate 17, height _0  (1.93 m), weight 128.459 kg (283 lb 3.2 oz), SpO2 94 %. Physical Exam: General: pleasant, obese white male who is laying in bed in NAD HEENT: head is normocephalic, atraumatic. NG in  Place. Sclera are noninjected. Mouth is pink and moist Heart: regular, rate, and rhythm.  No obvious murmurs, gallops, or rubs noted.  Palpable pedal pulses bilaterally Lungs: CTAB, no wheezes, rhonchi, or rales noted.  Respiratory effort nonlabored Abd: firm, TTP of epigastrum and LUQ, hypoactive bowel sounds, reducible umbilical hernia MS: all 4 extremities are symmetrical with no cyanosis, clubbing, or edema. Skin: warm and dry with no masses, lesions, or rashes; ecchymosis over R leg where vessels were harvested for [name]. Psych: A&Ox3 with an appropriate affect.  Results for orders placed or performed during  the hospital encounter of 04/13/16 (from the past 48 hour(s))  Glucose, capillary     Status: Abnormal   Collection Time: 04/18/16 12:35 PM  Result Value Ref Range   Glucose-Capillary 275 (H) 65 - 99 mg/dL   Comment 1 Notify RN    Comment 2 Document in Chart   Glucose, capillary     Status: Abnormal   Collection Time: 04/18/16  4:42 PM  Result Value Ref Range   Glucose-Capillary 197 (H) 65 - 99 mg/dL   Comment 1 Notify RN    Comment 2 Document in Chart   Glucose, capillary     Status: Abnormal   Collection Time: 04/18/16  9:27 PM  Result Value Ref Range   Glucose-Capillary 236 (H) 65 - 99 mg/dL   Comment 1 Notify RN    Comment 2 Document in Chart   Glucose, capillary     Status: Abnormal   Collection Time: 04/19/16  5:58 AM  Result Value Ref Range   Glucose-Capillary 145 (H) 65 - 99 mg/dL   Comment 1 Notify RN    Comment 2 Document in Chart   Lactic acid, plasma     Status: None    Collection Time: 04/19/16 10:41 AM  Result Value Ref Range   Lactic Acid, Venous 1.3 0.5 - 2.0 mmol/L  Comprehensive metabolic panel     Status: Abnormal   Collection Time: 04/19/16 10:41 AM  Result Value Ref Range   Sodium 133 (L) 135 - 145 mmol/L   Potassium 3.4 (L) 3.5 - 5.1 mmol/L    Comment: DELTA CHECK NOTED   Chloride 95 (L) 101 - 111 mmol/L   CO2 30 22 - 32 mmol/L   Glucose, Bld 126 (H) 65 - 99 mg/dL   BUN 32 (H) 6 - 20 mg/dL   Creatinine, Ser 1.30 (H) 0.61 - 1.24 mg/dL   Calcium 9.0 8.9 - 10.3 mg/dL   Total Protein 5.9 (L) 6.5 - 8.1 g/dL   Albumin 2.7 (L) 3.5 - 5.0 g/dL   AST 21 15 - 41 U/L   ALT 15 (L) 17 - 63 U/L   Alkaline Phosphatase 41 38 - 126 U/L   Total Bilirubin 1.3 (H) 0.3 - 1.2 mg/dL   GFR calc non Af Amer 56 (L) >60 mL/min   GFR calc Af Amer >60 >60 mL/min    Comment: (NOTE) The eGFR has been calculated using the CKD EPI equation. This calculation has not been validated in all clinical situations. eGFR's persistently <60 mL/min signify possible Chronic Kidney Disease.    Anion gap 8 5 - 15  CBC     Status: Abnormal   Collection Time: 04/19/16 10:41 AM  Result Value Ref Range   WBC 8.7 4.0 - 10.5 K/uL   RBC 2.95 (L) 4.22 - 5.81 MIL/uL   Hemoglobin 8.6 (L) 13.0 - 17.0 g/dL   HCT 26.3 (L) 39.0 - 52.0 %   MCV 89.2 78.0 - 100.0 fL   MCH 29.2 26.0 - 34.0 pg   MCHC 32.7 30.0 - 36.0 g/dL   RDW 13.5 11.5 - 15.5 %   Platelets 240 150 - 400 K/uL  Amylase     Status: None   Collection Time: 04/19/16 10:41 AM  Result Value Ref Range   Amylase 33 28 - 100 U/L  Lipase, blood     Status: None   Collection Time: 04/19/16 10:41 AM  Result Value Ref Range   Lipase 36 11 - 51 U/L  Lactic acid,  plasma     Status: None   Collection Time: 04/19/16 12:41 PM  Result Value Ref Range   Lactic Acid, Venous 1.4 0.5 - 2.0 mmol/L  Glucose, capillary     Status: Abnormal   Collection Time: 04/19/16 12:43 PM  Result Value Ref Range   Glucose-Capillary 133 (H) 65 - 99  mg/dL  Glucose, capillary     Status: Abnormal   Collection Time: 04/19/16  4:55 PM  Result Value Ref Range   Glucose-Capillary 158 (H) 65 - 99 mg/dL   Comment 1 Notify RN    Comment 2 Document in Chart   Glucose, capillary     Status: Abnormal   Collection Time: 04/19/16  9:31 PM  Result Value Ref Range   Glucose-Capillary 155 (H) 65 - 99 mg/dL   Comment 1 Notify RN    Comment 2 Document in Chart   Basic metabolic panel     Status: Abnormal   Collection Time: 04/20/16  2:18 AM  Result Value Ref Range   Sodium 135 135 - 145 mmol/L   Potassium 3.6 3.5 - 5.1 mmol/L   Chloride 95 (L) 101 - 111 mmol/L   CO2 31 22 - 32 mmol/L   Glucose, Bld 143 (H) 65 - 99 mg/dL   BUN 34 (H) 6 - 20 mg/dL   Creatinine, Ser 1.48 (H) 0.61 - 1.24 mg/dL   Calcium 9.1 8.9 - 10.3 mg/dL   GFR calc non Af Amer 48 (L) >60 mL/min   GFR calc Af Amer 55 (L) >60 mL/min    Comment: (NOTE) The eGFR has been calculated using the CKD EPI equation. This calculation has not been validated in all clinical situations. eGFR's persistently <60 mL/min signify possible Chronic Kidney Disease.    Anion gap 9 5 - 15  CBC     Status: Abnormal   Collection Time: 04/20/16  2:18 AM  Result Value Ref Range   WBC 7.4 4.0 - 10.5 K/uL   RBC 3.02 (L) 4.22 - 5.81 MIL/uL   Hemoglobin 8.8 (L) 13.0 - 17.0 g/dL   HCT 27.2 (L) 39.0 - 52.0 %   MCV 90.1 78.0 - 100.0 fL   MCH 29.1 26.0 - 34.0 pg   MCHC 32.4 30.0 - 36.0 g/dL   RDW 13.7 11.5 - 15.5 %   Platelets 245 150 - 400 K/uL  Glucose, capillary     Status: Abnormal   Collection Time: 04/20/16  6:38 AM  Result Value Ref Range   Glucose-Capillary 134 (H) 65 - 99 mg/dL  Glucose, capillary     Status: Abnormal   Collection Time: 04/20/16 11:23 AM  Result Value Ref Range   Glucose-Capillary 139 (H) 65 - 99 mg/dL   Dg Abd 2 Views  04/20/2016  CLINICAL DATA:  Nausea, abdominal distention. EXAM: ABDOMEN - 2 VIEW COMPARISON:  Radiograph of April 19, 2016. FINDINGS: Status post  cholecystectomy. There remains moderately dilated small bowel loops with air filled right colon. The degree of small bowel dilatation is not significantly changed compared to prior exam. Phleboliths are noted in the pelvis. IMPRESSION: Stable moderate dilatation of small bowel loops is noted which may represent ileus, although distal small bowel obstruction cannot be excluded. Continued radiographic follow-up is recommended. Electronically Signed   By: Marijo Conception, M.D.   On: 04/20/2016 08:25   Dg Abd 2 Views  04/19/2016  CLINICAL DATA:  Gaseous abdominal distention.  Recent [name]. EXAM: ABDOMEN - 2 VIEW COMPARISON:  08/26/2009 abdominal radiographs  FINDINGS: Sternotomy wires appear aligned and intact. Cholecystectomy clips are seen in the right upper quadrant of the abdomen. Moderate diffuse small bowel dilatation with scattered air-fluid levels. Mild diffuse gaseous distention of the colon. No evidence of pneumatosis or pneumoperitoneum. Degenerative changes in the visualized thoracolumbar spine. IMPRESSION: Moderate diffuse small bowel dilatation with scattered air-fluid levels. Mild diffuse gaseous distention of the colon. Findings indicate a moderate adynamic ileus in the postoperative setting. Electronically Signed   By: Ilona Sorrel M.D.   On: 04/19/2016 11:22   Assessment/Plan SBO v ileus S/p CABGx4 - POD#6, on Plavix - PMH lap chole 7-8 years ago, reducible umbilical hernia  - NPO, IVF, bowel rest, NG decompression on LIS, antiemetics - Abd film to confirm NG placement. - repeat abdominal film in AM   HTN Type II DM Mixed hyperlipidemia Obesity  Jill Alexanders, [facility] Surgery 04/20/2016, 12:21 PM Pager: [phone] Mon-Fri 7:00 am-4:30 pm Sat-Sun 7:00 am-11:30 am

## 2016-04-21 ENCOUNTER — Inpatient Hospital Stay (HOSPITAL_COMMUNITY): Payer: Medicare Other

## 2016-04-21 ENCOUNTER — Other Ambulatory Visit: Payer: Self-pay

## 2016-04-21 LAB — GLUCOSE, CAPILLARY
GLUCOSE-CAPILLARY: 205 mg/dL — AB (ref 65–99)
GLUCOSE-CAPILLARY: 182 mg/dL — AB (ref 65–99)
Glucose-Capillary: 163 mg/dL — ABNORMAL HIGH (ref 65–99)
Glucose-Capillary: 194 mg/dL — ABNORMAL HIGH (ref 65–99)

## 2016-04-21 LAB — TSH: TSH: 3.178 u[IU]/mL (ref 0.350–4.500)

## 2016-04-21 LAB — BASIC METABOLIC PANEL
Anion gap: 13 (ref 5–15)
BUN: 35 mg/dL — ABNORMAL HIGH (ref 6–20)
CALCIUM: 9.2 mg/dL (ref 8.9–10.3)
CO2: 28 mmol/L (ref 22–32)
CREATININE: 1.59 mg/dL — AB (ref 0.61–1.24)
Chloride: 96 mmol/L — ABNORMAL LOW (ref 101–111)
GFR calc non Af Amer: 44 mL/min — ABNORMAL LOW (ref >60)
GFR, EST AFRICAN AMERICAN: 51 mL/min — AB (ref >60)
Glucose, Bld: 164 mg/dL — ABNORMAL HIGH (ref 65–99)
Potassium: 3.8 mmol/L (ref 3.5–5.1)
SODIUM: 137 mmol/L (ref 135–145)

## 2016-04-21 MED ORDER — AMIODARONE HCL IN DEXTROSE 360-4.14 MG/200ML-% IV SOLN
30.0000 mg/h | INTRAVENOUS | Status: DC
  Administered 2016-04-21 (×2): 30 mg/h via INTRAVENOUS
  Filled 2016-04-21 (×3): qty 200

## 2016-04-21 MED ORDER — METOCLOPRAMIDE HCL 5 MG/ML IJ SOLN
5.0000 mg | Freq: Four times a day (QID) | INTRAMUSCULAR | Status: DC
  Administered 2016-04-21 – 2016-04-27 (×24): 5 mg via INTRAVENOUS
  Filled 2016-04-21 (×23): qty 2

## 2016-04-21 MED ORDER — AMIODARONE HCL IN DEXTROSE 360-4.14 MG/200ML-% IV SOLN
60.0000 mg/h | INTRAVENOUS | Status: AC
  Administered 2016-04-21: 60 mg/h via INTRAVENOUS

## 2016-04-21 NOTE — Progress Notes (Signed)
  Amiodarone Drug - Drug Interaction Consult Note  Recommendations:  Amiodarone is metabolized by the cytochrome P450 system and therefore has the potential to cause many drug interactions. Amiodarone has an average plasma half-life of 50 days (range 20 to 100 days).   There is potential for drug interactions to occur several weeks or months after stopping treatment and the onset of drug interactions may be slow after initiating amiodarone.   [x]  Statins: Increased risk of myopathy. Simvastatin- restrict dose to 20mg  daily. Other statins: counsel patients to report any muscle pain or weakness immediately. --Pt currently Lipitor80, Gemfibrozil  []  Anticoagulants: Amiodarone can increase anticoagulant effect. Consider warfarin dose reduction. Patients should be monitored closely and the dose of anticoagulant altered accordingly, remembering that amiodarone levels take several weeks to stabilize.  []  Antiepileptics: Amiodarone can increase plasma concentration of phenytoin, the dose should be reduced. Note that small changes in phenytoin dose can result in large changes in levels. Monitor patient and counsel on signs of toxicity.  [x]  Beta blockers: increased risk of bradycardia, AV block and myocardial depression.  Sotalol - avoid concomitant use.  --Coreg PTA  []   Calcium channel blockers (diltiazem and verapamil): increased risk of bradycardia, AV block and myocardial depression.  []   Cyclosporine: Amiodarone increases levels of cyclosporine. Reduced dose of cyclosporine is recommended.  []  Digoxin dose should be halved when amiodarone is started.  [x]  Diuretics: increased risk of cardiotoxicity if hypokalemia occurs.  --Losartan/HCTZ PTA  [x]  Oral hypoglycemic agents (glyburide, glipizide, glimepiride): increased risk of hypoglycemia. Patient's glucose levels should be monitored closely when initiating amiodarone therapy.    --Invokana PTA, Amaryl, metformin  []  Drugs that prolong the  QT interval:  Torsades de pointes risk may be increased with concurrent use - avoid if possible.  Monitor QTc, also keep magnesium/potassium WNL if concurrent therapy can't be avoided. Marland Kitchen. Antibiotics: e.g. fluoroquinolones, erythromycin. . Antiarrhythmics: e.g. quinidine, procainamide, disopyramide, sotalol. . Antipsychotics: e.g. phenothiazines, haloperidol.  . Lithium, tricyclic antidepressants, and methadone.  Thank You,   Tyrica Afzal S. Merilynn Finlandobertson, PharmD, BCPS Clinical Staff Pharmacist Pager [phone]  Misty StanleyRobertson, Constance Hackenberg [facility]  04/21/2016 8:15 AM

## 2016-04-21 NOTE — Progress Notes (Signed)
Patient ID: Jason [name], male   DOB: 05/19/1951, 65 y.o.   MRN: [id number]     [facility]      [address] [place], [place] [address]    Phone: [phone] FAX: [phone]     Subjective: 2345m ngt output.  Passed flatus. No activity.  Af with RVR, starting amio gtt per TCTS  Objective:  Vital signs:  Filed Vitals:   04/20/16 2001 04/20/16 2137 04/21/16 0404 04/21/16 0610  BP: 94/58 128/62 110/66 101/58  Pulse: 110   130  Temp: 97.6 F (36.4 C)   97.6 F (36.4 C)  TempSrc: Oral   Oral  Resp: 20     Height:      Weight:    126.735 kg (279 lb 6.4 oz)  SpO2: 95%   90%    Last BM Date:  (pre surgery)  Intake/Output   Yesterday:  06/26 0701 - 06/27 0700 In: 900 [I.V.:900] Out: 3000 [Urine:650; Emesis/NG output:2350] This shift:    I/O last 3 completed shifts: In: 900 [I.V.:900] Out: 3750 [Urine:1400; Emesis/NG output:2350]    Physical Exam: General: Pt awake/alert/oriented x4 in no acute distress  Abdomen: +bs, abdomen is distended, non tender.  Reducible umbilical hernia.   Problem List:   Principal Problem:   S/P [name] Active Problems:   Unstable angina (HCC)   Left main coronary artery disease   Coronary artery disease involving native coronary artery with unstable angina pectoris ([facility]   Type II diabetes mellitus ([facility]   Essential hypertension   Mixed hyperlipidemia   Obesity    Results:   Labs: Results for orders placed or performed during the hospital encounter of 04/13/16 (from the past 48 hour(s))  Lactic acid, plasma     Status: None   Collection Time: 04/19/16 10:41 AM  Result Value Ref Range   Lactic Acid, Venous 1.3 0.5 - 2.0 mmol/L  Comprehensive metabolic panel     Status: Abnormal   Collection Time: 04/19/16 10:41 AM  Result Value Ref Range   Sodium 133 (L) 135 - 145 mmol/L   Potassium 3.4 (L) 3.5 - 5.1 mmol/L    Comment: DELTA CHECK NOTED   Chloride 95 (L) 101 - 111 mmol/L    CO2 30 22 - 32 mmol/L   Glucose, Bld 126 (H) 65 - 99 mg/dL   BUN 32 (H) 6 - 20 mg/dL   Creatinine, Ser 1.30 (H) 0.61 - 1.24 mg/dL   Calcium 9.0 8.9 - 10.3 mg/dL   Total Protein 5.9 (L) 6.5 - 8.1 g/dL   Albumin 2.7 (L) 3.5 - 5.0 g/dL   AST 21 15 - 41 U/L   ALT 15 (L) 17 - 63 U/L   Alkaline Phosphatase 41 38 - 126 U/L   Total Bilirubin 1.3 (H) 0.3 - 1.2 mg/dL   GFR calc non Af Amer 56 (L) >60 mL/min   GFR calc Af Amer >60 >60 mL/min    Comment: (NOTE) The eGFR has been calculated using the CKD EPI equation. This calculation has not been validated in all clinical situations. eGFR's persistently <60 mL/min signify possible Chronic Kidney Disease.    Anion gap 8 5 - 15  CBC     Status: Abnormal   Collection Time: 04/19/16 10:41 AM  Result Value Ref Range   WBC 8.7 4.0 - 10.5 K/uL   RBC 2.95 (L) 4.22 - 5.81 MIL/uL   Hemoglobin 8.6 (L) 13.0 - 17.0 g/dL  HCT 26.3 (L) 39.0 - 52.0 %   MCV 89.2 78.0 - 100.0 fL   MCH 29.2 26.0 - 34.0 pg   MCHC 32.7 30.0 - 36.0 g/dL   RDW 13.5 11.5 - 15.5 %   Platelets 240 150 - 400 K/uL  Amylase     Status: None   Collection Time: 04/19/16 10:41 AM  Result Value Ref Range   Amylase 33 28 - 100 U/L  Lipase, blood     Status: None   Collection Time: 04/19/16 10:41 AM  Result Value Ref Range   Lipase 36 11 - 51 U/L  Lactic acid, plasma     Status: None   Collection Time: 04/19/16 12:41 PM  Result Value Ref Range   Lactic Acid, Venous 1.4 0.5 - 2.0 mmol/L  Glucose, capillary     Status: Abnormal   Collection Time: 04/19/16 12:43 PM  Result Value Ref Range   Glucose-Capillary 133 (H) 65 - 99 mg/dL  Glucose, capillary     Status: Abnormal   Collection Time: 04/19/16  4:55 PM  Result Value Ref Range   Glucose-Capillary 158 (H) 65 - 99 mg/dL   Comment 1 Notify RN    Comment 2 Document in Chart   Glucose, capillary     Status: Abnormal   Collection Time: 04/19/16  9:31 PM  Result Value Ref Range   Glucose-Capillary 155 (H) 65 - 99 mg/dL    Comment 1 Notify RN    Comment 2 Document in Chart   Basic metabolic panel     Status: Abnormal   Collection Time: 04/20/16  2:18 AM  Result Value Ref Range   Sodium 135 135 - 145 mmol/L   Potassium 3.6 3.5 - 5.1 mmol/L   Chloride 95 (L) 101 - 111 mmol/L   CO2 31 22 - 32 mmol/L   Glucose, Bld 143 (H) 65 - 99 mg/dL   BUN 34 (H) 6 - 20 mg/dL   Creatinine, Ser 1.48 (H) 0.61 - 1.24 mg/dL   Calcium 9.1 8.9 - 10.3 mg/dL   GFR calc non Af Amer 48 (L) >60 mL/min   GFR calc Af Amer 55 (L) >60 mL/min    Comment: (NOTE) The eGFR has been calculated using the CKD EPI equation. This calculation has not been validated in all clinical situations. eGFR's persistently <60 mL/min signify possible Chronic Kidney Disease.    Anion gap 9 5 - 15  CBC     Status: Abnormal   Collection Time: 04/20/16  2:18 AM  Result Value Ref Range   WBC 7.4 4.0 - 10.5 K/uL   RBC 3.02 (L) 4.22 - 5.81 MIL/uL   Hemoglobin 8.8 (L) 13.0 - 17.0 g/dL   HCT 27.2 (L) 39.0 - 52.0 %   MCV 90.1 78.0 - 100.0 fL   MCH 29.1 26.0 - 34.0 pg   MCHC 32.4 30.0 - 36.0 g/dL   RDW 13.7 11.5 - 15.5 %   Platelets 245 150 - 400 K/uL  Glucose, capillary     Status: Abnormal   Collection Time: 04/20/16  6:38 AM  Result Value Ref Range   Glucose-Capillary 134 (H) 65 - 99 mg/dL  Glucose, capillary     Status: Abnormal   Collection Time: 04/20/16 11:23 AM  Result Value Ref Range   Glucose-Capillary 139 (H) 65 - 99 mg/dL  Glucose, capillary     Status: Abnormal   Collection Time: 04/20/16  4:43 PM  Result Value Ref Range   Glucose-Capillary 182 (H)  65 - 99 mg/dL  Glucose, capillary     Status: Abnormal   Collection Time: 04/20/16  8:56 PM  Result Value Ref Range   Glucose-Capillary 172 (H) 65 - 99 mg/dL   Comment 1 Notify RN   Basic metabolic panel     Status: Abnormal   Collection Time: 04/21/16  3:19 AM  Result Value Ref Range   Sodium 137 135 - 145 mmol/L   Potassium 3.8 3.5 - 5.1 mmol/L   Chloride 96 (L) 101 - 111 mmol/L    CO2 28 22 - 32 mmol/L   Glucose, Bld 164 (H) 65 - 99 mg/dL   BUN 35 (H) 6 - 20 mg/dL   Creatinine, Ser 1.59 (H) 0.61 - 1.24 mg/dL   Calcium 9.2 8.9 - 10.3 mg/dL   GFR calc non Af Amer 44 (L) >60 mL/min   GFR calc Af Amer 51 (L) >60 mL/min    Comment: (NOTE) The eGFR has been calculated using the CKD EPI equation. This calculation has not been validated in all clinical situations. eGFR's persistently <60 mL/min signify possible Chronic Kidney Disease.    Anion gap 13 5 - 15  Glucose, capillary     Status: Abnormal   Collection Time: 04/21/16  5:40 AM  Result Value Ref Range   Glucose-Capillary 163 (H) 65 - 99 mg/dL   Comment 1 Notify RN     Imaging / Studies: Dg Abd 2 Views  04/20/2016  CLINICAL DATA:  Nausea, abdominal distention. EXAM: ABDOMEN - 2 VIEW COMPARISON:  Radiograph of April 19, 2016. FINDINGS: Status post cholecystectomy. There remains moderately dilated small bowel loops with air filled right colon. The degree of small bowel dilatation is not significantly changed compared to prior exam. Phleboliths are noted in the pelvis. IMPRESSION: Stable moderate dilatation of small bowel loops is noted which may represent ileus, although distal small bowel obstruction cannot be excluded. Continued radiographic follow-up is recommended. Electronically Signed   By: Marijo Conception, M.D.   On: 04/20/2016 08:25   Dg Abd 2 Views  04/19/2016  CLINICAL DATA:  Gaseous abdominal distention.  Recent [name]. EXAM: ABDOMEN - 2 VIEW COMPARISON:  08/26/2009 abdominal radiographs FINDINGS: Sternotomy wires appear aligned and intact. Cholecystectomy clips are seen in the right upper quadrant of the abdomen. Moderate diffuse small bowel dilatation with scattered air-fluid levels. Mild diffuse gaseous distention of the colon. No evidence of pneumatosis or pneumoperitoneum. Degenerative changes in the visualized thoracolumbar spine. IMPRESSION: Moderate diffuse small bowel dilatation with scattered air-fluid  levels. Mild diffuse gaseous distention of the colon. Findings indicate a moderate adynamic ileus in the postoperative setting. Electronically Signed   By: Ilona Sorrel M.D.   On: 04/19/2016 11:22   Dg Abd Portable 1v  04/21/2016  CLINICAL DATA:  Ileus.  Abdominal distention. EXAM: PORTABLE ABDOMEN - 1 VIEW COMPARISON:  04/20/2016. FINDINGS: Surgical clips right upper quadrant. NG tube in stable position. Again noted are multiple dilated loops of bowel throughout the abdomen. These findings are most consistent with adynamic ileus. Continued follow-up exam suggested demonstrate clearing and to exclude bowel obstruction. IMPRESSION: 1. NG tube in stable position with tip in the stomach. 2. Persistent unchanged dilated loops of bowel most consistent with adynamic ileus. Continued follow-up exams suggested to demonstrate resolution and to exclude bowel obstruction. Electronically Signed   By: Marcello Moores  Register   On: 04/21/2016 07:40   Dg Abd Portable 1v  04/20/2016  CLINICAL DATA:  Encounter for nasogastric tube placement. EXAM: PORTABLE ABDOMEN -  1 VIEW COMPARISON:  04/20/2016. FINDINGS: The nasogastric tube is in the upper abdomen. Tip is in the proximal stomach region. There continues to be gaseous distension of the stomach and multiple small bowel loops. IMPRESSION: Nasogastric tube in the proximal stomach region. Persistent gas-filled loops of bowel throughout the abdomen. Electronically Signed   By: Markus Daft M.D.   On: 04/20/2016 14:49    Medications / Allergies:  Scheduled Meds: . atorvastatin  80 mg Oral q1800  . bisacodyl  10 mg Oral Daily   Or  . bisacodyl  10 mg Rectal Daily  . enoxaparin (LOVENOX) injection  40 mg Subcutaneous Q24H  . famotidine (PEPCID) IV  20 mg Intravenous Q12H  . gemfibrozil  600 mg Oral BID AC  . insulin aspart  0-24 Units Subcutaneous TID AC & HS  . metoCLOPramide (REGLAN) injection  5 mg Intravenous Q6H  . sodium chloride flush  3 mL Intravenous Q12H    Continuous Infusions: . dextrose 5 % and 0.45 % NaCl with KCl 20 mEq/L 75 mL/hr at 04/21/16 0402   PRN Meds:.antiseptic oral rinse, gi cocktail, lactulose, LORazepam, metoprolol, morphine injection, ondansetron (ZOFRAN) IV, oxyCODONE, phenol, sodium chloride flush, traMADol  Antibiotics: Anti-infectives    Start     Dose/Rate Route Frequency Ordered Stop   04/15/16 2130  vancomycin (VANCOCIN) IVPB 1000 mg/200 mL premix  Status:  Discontinued     1,000 mg 200 mL/hr over 60 Minutes Intravenous  Once 04/14/16 1626 04/15/16 1107   04/15/16 1200  vancomycin (VANCOCIN) IVPB 1000 mg/200 mL premix     1,000 mg 200 mL/hr over 60 Minutes Intravenous  Once 04/15/16 1107 04/15/16 1255   04/15/16 1000  levofloxacin (LEVAQUIN) IVPB 750 mg     750 mg 100 mL/hr over 90 Minutes Intravenous Every 24 hours 04/14/16 1626 04/15/16 1058   04/14/16 0400  vancomycin (VANCOCIN) 1,500 mg in sodium chloride 0.9 % 250 mL IVPB     1,500 mg 125 mL/hr over 120 Minutes Intravenous To Surgery 04/13/16 1810 04/14/16 1145   04/14/16 0400  vancomycin (VANCOCIN) 1,000 mg in sodium chloride 0.9 % 1,000 mL irrigation      Irrigation To Surgery 04/13/16 1809 04/14/16 1355   04/14/16 0400  levofloxacin (LEVAQUIN) IVPB 500 mg     500 mg 100 mL/hr over 60 Minutes Intravenous To Surgery 04/13/16 1810 04/14/16 1153        Assessment/Plan SBO v ileus, more likely an ileus.  Continue with NGT decompression, bowel rest, start Reglan, monitor for symptoms. Mobilize.  Anticipate will resolve with time S/p CABGx4   Erby Pian, [facility] Surgery Pager [phone](7A-4:30P) For consults and floor pages call [phone](7A-4:30P)  04/21/2016 7:54 AM

## 2016-04-21 NOTE — Progress Notes (Signed)
CARDIAC REHAB PHASE I   PRE:  Rate/Rhythm: 113 afib    BP: sitting 116/66    SaO2: 93 RA  MODE:  Ambulation: 150 ft   POST:  Rate/Rhythm: 146 afib    BP: sitting 109/56     SaO2: 98 RA  Pt needed assist x2 to get out of bed without using his arms. He is actually insistent on using his arms to pull out of bed and also to move his buttocks to EOB. He sts this is the only way he can do it. Discussed the risks of this. Pt able to stand fairly well and walk with RW. Rest x1 at our prompting, did not want to walk any farther. To recliner. Only c/o is right thigh with bruising being sore. HR up to 146 afib. Will continue to follow. Needs more education on sternal precautions. Wife present. [phone]   Jason MassonRandi Kristan Laporche [name] CES, ACSM 04/21/2016 1:40 PM

## 2016-04-21 NOTE — Progress Notes (Addendum)
[address]       [place] [id number]             [phone]      7 Days Post-Op Procedure(s) (LRB): CORONARY ARTERY BYPASS GRAFTING TIMES FOUR    USING LEFT INTERNAL MAMMARY AND ENDOSCOPIC HARVEST RIGHT SAPHENOUS VEIN (N/A) INTRAOPERATIVE TRANSESOPHAGEAL ECHOCARDIOGRAM (N/A) Subjective: Feels fair, minimal flatus  Objective: Vital signs in last 24 hours: Temp:  [97.6 F (36.4 C)-98 F (36.7 C)] 97.6 F (36.4 C) (06/27 0610) Pulse Rate:  [90-130] 130 (06/27 0610) Cardiac Rhythm:  [-] Other (Comment) (06/27 0320) Resp:  [20] 20 (06/26 2001) BP: (94-128)/(58-66) 101/58 mmHg (06/27 0610) SpO2:  [90 %-95 %] 90 % (06/27 0610) Weight:  [279 lb 6.4 oz (126.735 kg)] 279 lb 6.4 oz (126.735 kg) (06/27 0610)  Hemodynamic parameters for last 24 hours:    Intake/Output from previous day: 06/26 0701 - 06/27 0700 In: 900 [I.V.:900] Out: 3000 [Urine:650; Emesis/NG output:2350] Intake/Output this shift:    General appearance: alert, cooperative and no distress Heart: irregularly irregular rhythm and tachy Lungs: clear to auscultation bilaterally Abdomen: + distended, mild ttp, scant BS Extremities: + edema Wound: incis healing well, mod echymosisright thigh  Lab Results:  Recent Labs  04/19/16 1041 04/20/16 0218  WBC 8.7 7.4  HGB 8.6* 8.8*  HCT 26.3* 27.2*  PLT 240 245   BMET:  Recent Labs  04/20/16 0218 04/21/16 0319  NA 135 137  K 3.6 3.8  CL 95* 96*  CO2 31 28  GLUCOSE 143* 164*  BUN 34* 35*  CREATININE 1.48* 1.59*  CALCIUM 9.1 9.2    PT/INR: No results for input(s): LABPROT, INR in the last 72 hours. ABG    Component Value Date/Time   PHART 7.328* 04/14/2016 2006   HCO3 25.0* 04/14/2016 2006   TCO2 26 04/15/2016 1707   ACIDBASEDEF 1.0 04/14/2016 2006   O2SAT 91.0 04/14/2016 2006   CBG (last 3)   Recent Labs  04/20/16 1643 04/20/16 2056 04/21/16 0540  GLUCAP 182* 172* 163*    Meds Scheduled Meds: . atorvastatin  80  mg Oral q1800  . bisacodyl  10 mg Oral Daily   Or  . bisacodyl  10 mg Rectal Daily  . enoxaparin (LOVENOX) injection  40 mg Subcutaneous Q24H  . famotidine (PEPCID) IV  20 mg Intravenous Q12H  . gemfibrozil  600 mg Oral BID AC  . insulin aspart  0-24 Units Subcutaneous TID AC & HS  . sodium chloride flush  3 mL Intravenous Q12H   Continuous Infusions: . dextrose 5 % and 0.45 % NaCl with KCl 20 mEq/L 75 mL/hr at 04/21/16 0402   PRN Meds:.antiseptic oral rinse, gi cocktail, lactulose, LORazepam, metoprolol, morphine injection, ondansetron (ZOFRAN) IV, oxyCODONE, phenol, sodium chloride flush, traMADol  Xrays Dg Abd 2 Views  04/20/2016  CLINICAL DATA:  Nausea, abdominal distention. EXAM: ABDOMEN - 2 VIEW COMPARISON:  Radiograph of April 19, 2016. FINDINGS: Status post cholecystectomy. There remains moderately dilated small bowel loops with air filled right colon. The degree of small bowel dilatation is not significantly changed compared to prior exam. Phleboliths are noted in the pelvis. IMPRESSION: Stable moderate dilatation of small bowel loops is noted which may represent ileus, although distal small bowel obstruction cannot be excluded. Continued radiographic follow-up is recommended. Electronically Signed   By: Lupita RaiderJames  Green Jr, M.D.   On: 04/20/2016 08:25   Dg Abd 2 Views  04/19/2016  CLINICAL DATA:  Gaseous  abdominal distention.  Recent [name]. EXAM: ABDOMEN - 2 VIEW COMPARISON:  08/26/2009 abdominal radiographs FINDINGS: Sternotomy wires appear aligned and intact. Cholecystectomy clips are seen in the right upper quadrant of the abdomen. Moderate diffuse small bowel dilatation with scattered air-fluid levels. Mild diffuse gaseous distention of the colon. No evidence of pneumatosis or pneumoperitoneum. Degenerative changes in the visualized thoracolumbar spine. IMPRESSION: Moderate diffuse small bowel dilatation with scattered air-fluid levels. Mild diffuse gaseous distention of the colon.  Findings indicate a moderate adynamic ileus in the postoperative setting. Electronically Signed   By: Delbert PhenixJason A Poff M.D.   On: 04/19/2016 11:22   Dg Abd Portable 1v  04/20/2016  CLINICAL DATA:  Encounter for nasogastric tube placement. EXAM: PORTABLE ABDOMEN - 1 VIEW COMPARISON:  04/20/2016. FINDINGS: The nasogastric tube is in the upper abdomen. Tip is in the proximal stomach region. There continues to be gaseous distension of the stomach and multiple small bowel loops. IMPRESSION: Nasogastric tube in the proximal stomach region. Persistent gas-filled loops of bowel throughout the abdomen. Electronically Signed   By: Richarda OverlieAdam  Henn M.D.   On: 04/20/2016 14:49    Assessment/Plan: S/P Procedure(s) (LRB): CORONARY ARTERY BYPASS GRAFTING TIMES FOUR    USING LEFT INTERNAL MAMMARY AND ENDOSCOPIC HARVEST RIGHT SAPHENOUS VEIN (N/A) INTRAOPERATIVE TRANSESOPHAGEAL ECHOCARDIOGRAM (N/A)  1 afib with RVR, will start amio gtt 2 Ileus- stable with NGT, min flatus, no BM- general surgery is managing 3 Creat is rising slowly, cont IVF- he is putting out a lot from NGT- may need to increase IVF rate 4 limit narcotics 5 increase ambulation as able 6 H/H is stable 7 no ACEI until BP improved  LOS: 8 days    GOLD,WAYNE E 04/21/2016  Increase iv rate Now in afib started on cordrone Place pic line for cordrone infusion and poss iv alimentation Hypoactive bowel sounds, not as distended  I have seen and examined Elpidio AnisJohn C Yebra and agree with the above assessment  and plan.  Delight OvensEdward B Kayl Stogdill MD [facility] [phone] Office [phone] 04/21/2016 3:25 PM

## 2016-04-21 NOTE — Progress Notes (Addendum)
This RN paged Dr Neysa BonitoGerhard'st office to notify them of the QTc value of 523 orders received from MD to stop  amiodarone drip. Will continue to monitor

## 2016-04-22 LAB — CBC
HCT: 28.2 % — ABNORMAL LOW (ref 39.0–52.0)
Hemoglobin: 9 g/dL — ABNORMAL LOW (ref 13.0–17.0)
MCH: 29.1 pg (ref 26.0–34.0)
MCHC: 31.9 g/dL (ref 30.0–36.0)
MCV: 91.3 fL (ref 78.0–100.0)
Platelets: 256 10*3/uL (ref 150–400)
RBC: 3.09 MIL/uL — ABNORMAL LOW (ref 4.22–5.81)
RDW: 14.2 % (ref 11.5–15.5)
WBC: 6.2 10*3/uL (ref 4.0–10.5)

## 2016-04-22 LAB — COMPREHENSIVE METABOLIC PANEL
ALK PHOS: 50 U/L (ref 38–126)
ALT: 17 U/L (ref 17–63)
AST: 34 U/L (ref 15–41)
Albumin: 2.6 g/dL — ABNORMAL LOW (ref 3.5–5.0)
Anion gap: 8 (ref 5–15)
BILIRUBIN TOTAL: 1.7 mg/dL — AB (ref 0.3–1.2)
BUN: 35 mg/dL — AB (ref 6–20)
CALCIUM: 8.7 mg/dL — AB (ref 8.9–10.3)
CHLORIDE: 96 mmol/L — AB (ref 101–111)
CO2: 30 mmol/L (ref 22–32)
CREATININE: 1.5 mg/dL — AB (ref 0.61–1.24)
GFR calc Af Amer: 54 mL/min — ABNORMAL LOW (ref >60)
GFR, EST NON AFRICAN AMERICAN: 47 mL/min — AB (ref >60)
Glucose, Bld: 204 mg/dL — ABNORMAL HIGH (ref 65–99)
Potassium: 3.9 mmol/L (ref 3.5–5.1)
Sodium: 134 mmol/L — ABNORMAL LOW (ref 135–145)
Total Protein: 5.8 g/dL — ABNORMAL LOW (ref 6.5–8.1)

## 2016-04-22 LAB — GLUCOSE, CAPILLARY
GLUCOSE-CAPILLARY: 194 mg/dL — AB (ref 65–99)
GLUCOSE-CAPILLARY: 207 mg/dL — AB (ref 65–99)
GLUCOSE-CAPILLARY: 218 mg/dL — AB (ref 65–99)
Glucose-Capillary: 234 mg/dL — ABNORMAL HIGH (ref 65–99)
Glucose-Capillary: 206 mg/dL — ABNORMAL HIGH (ref 65–99)

## 2016-04-22 MED ORDER — AMIODARONE HCL IN DEXTROSE 360-4.14 MG/200ML-% IV SOLN
15.0000 mg/h | INTRAVENOUS | Status: DC
  Administered 2016-04-22 (×2): 30 mg/h via INTRAVENOUS
  Administered 2016-04-23 – 2016-04-27 (×5): 15 mg/h via INTRAVENOUS
  Filled 2016-04-22 (×8): qty 200

## 2016-04-22 MED ORDER — SODIUM CHLORIDE 0.9% FLUSH
10.0000 mL | INTRAVENOUS | Status: DC | PRN
  Administered 2016-04-24 – 2016-05-06 (×10): 10 mL
  Filled 2016-04-22 (×10): qty 40

## 2016-04-22 MED ORDER — ACETAMINOPHEN 325 MG RE SUPP
325.0000 mg | Freq: Four times a day (QID) | RECTAL | Status: DC | PRN
  Administered 2016-04-22: 325 mg via RECTAL
  Filled 2016-04-22: qty 1

## 2016-04-22 MED ORDER — KCL IN DEXTROSE-NACL 20-5-0.45 MEQ/L-%-% IV SOLN
INTRAVENOUS | Status: AC
  Administered 2016-04-22 – 2016-04-25 (×5): via INTRAVENOUS
  Filled 2016-04-22 (×4): qty 1000

## 2016-04-22 MED ORDER — INSULIN ASPART 100 UNIT/ML ~~LOC~~ SOLN
0.0000 [IU] | SUBCUTANEOUS | Status: DC
  Administered 2016-04-22: 8 [IU] via SUBCUTANEOUS
  Administered 2016-04-22: 4 [IU] via SUBCUTANEOUS
  Administered 2016-04-22: 8 [IU] via SUBCUTANEOUS
  Administered 2016-04-23 – 2016-04-24 (×8): 4 [IU] via SUBCUTANEOUS
  Administered 2016-04-24: 2 [IU] via SUBCUTANEOUS
  Administered 2016-04-24 (×2): 4 [IU] via SUBCUTANEOUS
  Administered 2016-04-25 (×3): 2 [IU] via SUBCUTANEOUS
  Administered 2016-04-25: 4 [IU] via SUBCUTANEOUS
  Administered 2016-04-25 (×2): 2 [IU] via SUBCUTANEOUS
  Administered 2016-04-26: 8 [IU] via SUBCUTANEOUS
  Administered 2016-04-26 (×2): 4 [IU] via SUBCUTANEOUS

## 2016-04-22 MED ORDER — TRACE MINERALS CR-CU-MN-SE-ZN 10-1000-500-60 MCG/ML IV SOLN
INTRAVENOUS | Status: AC
  Administered 2016-04-22: 18:00:00 via INTRAVENOUS
  Filled 2016-04-22: qty 720

## 2016-04-22 MED ORDER — FAT EMULSION 20 % IV EMUL
120.0000 mL | INTRAVENOUS | Status: AC
  Administered 2016-04-22 (×2): 120 mL via INTRAVENOUS
  Filled 2016-04-22: qty 250

## 2016-04-22 NOTE — Progress Notes (Signed)
Peripherally Inserted Central Catheter/Midline Placement  The IV Nurse has discussed with the patient and/or persons authorized to consent for the patient, the purpose of this procedure and the potential benefits and risks involved with this procedure.  The benefits include less needle sticks, lab draws from the catheter and patient may be discharged home with the catheter.  Risks include, but not limited to, infection, bleeding, blood clot (thrombus formation), and puncture of an artery; nerve damage and irregular heat beat.  Alternatives to this procedure were also discussed.  PICC/Midline Placement Documentation        Jason [name], Jason [name] 04/22/2016, 3:42 PM

## 2016-04-22 NOTE — Progress Notes (Signed)
CARDIAC REHAB PHASE I   PRE:  Rate/Rhythm: 136 afib    BP: sitting 108/67    SaO2: 95 RA  MODE:  Ambulation: 250 ft   POST:  Rate/Rhythm: 151 afib    BP: sitting 103/70     SaO2: 98 RA  Pt c/o sternal pain and groin pain. In depth teaching done regarding mechanics of getting out of bed and continuing sternal precautions. Pt listened slightly better today, he is just frustrated in general. Steady walking, encouraged more upright posture. HR mostly 140s. Encouraged pt to increase distance, which he was able to do. Becoming more tired and SOB toward end. To bed as he had sat up in recliner 5 hrs in early am. Instructed pt on 2 more walks today. Pt would benefit from PT for more mobility and continued education of correct mechanics (bed mobility, etc). [phone]   Harriet MassonRandi Kristan Cortana Vanderford CES, ACSM 04/22/2016 8:43 AM

## 2016-04-22 NOTE — Care Management Important Message (Signed)
Important Message  Patient Details  Name: Elpidio AnisJohn C Lampron MRN: [id number] Date of Birth: 06/10/1951   Medicare Important Message Given:  Yes    Bernadette HoitShoffner, Darcia Lampi Coleman 04/22/2016, 10:02 AM

## 2016-04-22 NOTE — Progress Notes (Addendum)
[address]       [place], [id number]             [phone]      8 Days Post-Op Procedure(s) (LRB): CORONARY ARTERY BYPASS GRAFTING TIMES FOUR    USING LEFT INTERNAL MAMMARY AND ENDOSCOPIC HARVEST RIGHT SAPHENOUS VEIN (N/A) INTRAOPERATIVE TRANSESOPHAGEAL ECHOCARDIOGRAM (N/A) Subjective: Feels fair, minimal flatus, remains in afib with RVR Amiodarone was stopped for "prolonged QT interval however patient is in afib Objective: Vital signs in last 24 hours: Temp:  [98.1 F (36.7 C)-98.3 F (36.8 C)] 98.1 F (36.7 C) (06/28 0657) Pulse Rate:  [119-135] 135 (06/28 0657) Cardiac Rhythm:  [-] Atrial fibrillation (06/27 1919) Resp:  [15-18] 15 (06/27 2022) BP: (94-116)/(54-71) 94/64 mmHg (06/28 0657) SpO2:  [95 %-97 %] 97 % (06/28 0657)  Hemodynamic parameters for last 24 hours:    Intake/Output from previous day: 06/27 0701 - 06/28 0700 In: 393.2 [I.V.:393.2] Out: 1945 [Urine:1295; Emesis/NG output:650] Intake/Output this shift:    General appearance: alert, cooperative, fatigued and no distress Heart: irregularly irregular rhythm and tachy Lungs: clear to auscultation bilaterally Abdomen: + BS, mod dist, mild ttp Extremities: + LE edema Wound: incis healing well, right thigh echymosis is stable  Lab Results:  Recent Labs  04/20/16 0218 04/22/16 0355  WBC 7.4 6.2  HGB 8.8* 9.0*  HCT 27.2* 28.2*  PLT 245 256   BMET:  Recent Labs  04/21/16 0319 04/22/16 0355  NA 137 134*  K 3.8 3.9  CL 96* 96*  CO2 28 30  GLUCOSE 164* 204*  BUN 35* 35*  CREATININE 1.59* 1.50*  CALCIUM 9.2 8.7*    PT/INR: No results for input(s): LABPROT, INR in the last 72 hours. ABG    Component Value Date/Time   PHART 7.328* 04/14/2016 2006   HCO3 25.0* 04/14/2016 2006   TCO2 26 04/15/2016 1707   ACIDBASEDEF 1.0 04/14/2016 2006   O2SAT 91.0 04/14/2016 2006   CBG (last 3)   Recent Labs  04/21/16 1627 04/21/16 2043 04/22/16 0612  GLUCAP 205* 182*  234*    Meds Scheduled Meds: . atorvastatin  80 mg Oral q1800  . bisacodyl  10 mg Oral Daily   Or  . bisacodyl  10 mg Rectal Daily  . enoxaparin (LOVENOX) injection  40 mg Subcutaneous Q24H  . famotidine (PEPCID) IV  20 mg Intravenous Q12H  . gemfibrozil  600 mg Oral BID AC  . insulin aspart  0-24 Units Subcutaneous TID AC & HS  . metoCLOPramide (REGLAN) injection  5 mg Intravenous Q6H  . sodium chloride flush  3 mL Intravenous Q12H   Continuous Infusions: . dextrose 5 % and 0.45 % NaCl with KCl 20 mEq/L 100 mL/hr at 04/21/16 1748   PRN Meds:.antiseptic oral rinse, gi cocktail, lactulose, LORazepam, metoprolol, morphine injection, ondansetron (ZOFRAN) IV, oxyCODONE, phenol, sodium chloride flush, traMADol  Xrays Dg Abd 2 Views  04/20/2016  CLINICAL DATA:  Nausea, abdominal distention. EXAM: ABDOMEN - 2 VIEW COMPARISON:  Radiograph of April 19, 2016. FINDINGS: Status post cholecystectomy. There remains moderately dilated small bowel loops with air filled right colon. The degree of small bowel dilatation is not significantly changed compared to prior exam. Phleboliths are noted in the pelvis. IMPRESSION: Stable moderate dilatation of small bowel loops is noted which may represent ileus, although distal small bowel obstruction cannot be excluded. Continued radiographic follow-up is recommended. Electronically Signed   By: Lupita RaiderJames  Green Jr, M.D.   On: 04/20/2016  08:25   Dg Abd Portable 1v  04/21/2016  CLINICAL DATA:  Ileus.  Abdominal distention. EXAM: PORTABLE ABDOMEN - 1 VIEW COMPARISON:  04/20/2016. FINDINGS: Surgical clips right upper quadrant. NG tube in stable position. Again noted are multiple dilated loops of bowel throughout the abdomen. These findings are most consistent with adynamic ileus. Continued follow-up exam suggested demonstrate clearing and to exclude bowel obstruction. IMPRESSION: 1. NG tube in stable position with tip in the stomach. 2. Persistent unchanged dilated loops of  bowel most consistent with adynamic ileus. Continued follow-up exams suggested to demonstrate resolution and to exclude bowel obstruction. Electronically Signed   By: Maisie Fushomas  Register   On: 04/21/2016 07:40   Dg Abd Portable 1v  04/20/2016  CLINICAL DATA:  Encounter for nasogastric tube placement. EXAM: PORTABLE ABDOMEN - 1 VIEW COMPARISON:  04/20/2016. FINDINGS: The nasogastric tube is in the upper abdomen. Tip is in the proximal stomach region. There continues to be gaseous distension of the stomach and multiple small bowel loops. IMPRESSION: Nasogastric tube in the proximal stomach region. Persistent gas-filled loops of bowel throughout the abdomen. Electronically Signed   By: Richarda OverlieAdam  Henn M.D.   On: 04/20/2016 14:49    Assessment/Plan: S/P Procedure(s) (LRB): CORONARY ARTERY BYPASS GRAFTING TIMES FOUR    USING LEFT INTERNAL MAMMARY AND ENDOSCOPIC HARVEST RIGHT SAPHENOUS VEIN (N/A) INTRAOPERATIVE TRANSESOPHAGEAL ECHOCARDIOGRAM (N/A)  1 afib with rvr, amiodarone was stopped- restart because it is difficult to calculate a QT with afib /RVR 2 slight clinical improvement with ileus, probably needs to start [name] 3 creat slightly improved- cont hydration, NGT drainage may be slowing 4 d/c morphine 5 tylenol supp for mild.mod pain 6 ambulate as able  LOS: 9 days    GOLD,WAYNE E 04/22/2016  Bowel sounds are severely hypo-active and abdomen remains distended consistent with severe ileus-no guarding We'll start T NA via PICC line and continue IV amiodarone Continue NG suction and KUB every morning  patient examined and medical record reviewed,agree with above note. Kathlee Nationseter Van Trigt III 04/22/2016

## 2016-04-22 NOTE — Progress Notes (Signed)
PARENTERAL NUTRITION CONSULT NOTE - INITIAL  Pharmacy Consult for [name] Indication: [name]  Allergies  Allergen Reactions  . Aspirin Anaphylaxis  . Inderal [Propranolol]     Other reaction(s): Other (See Comments) Fatigue  . Lovastatin     Other reaction(s): Muscle Pain  . Metformin Hcl Diarrhea  . Penicillin G Hives  . Pravastatin Sodium     Other reaction(s): Muscle Pain  . Simvastatin     Other reaction(s): Muscle Pain  . Penicillins Rash    Has patient had a PCN reaction causing immediate rash, facial/tongue/throat swelling, SOB or lightheadedness with hypotension: Yes Has patient had a PCN reaction causing severe rash involving mucus membranes or skin necrosis: Yes Has patient had a PCN reaction that required hospitalization No Has patient had a PCN reaction occurring within the last 10 years: Yes If all of the above answers are "NO", then may proceed with Cephalosporin use.     Patient Measurements: Height: 6\' 4"  (193 cm) Weight: 279 lb 6.4 oz (126.735 kg) IBW/kg (Calculated) : 86.8  Vital Signs: Temp: 98.1 F (36.7 C) (06/28 0657) Temp Source: Oral (06/28 0657) BP: 94/64 mmHg (06/28 0657) Pulse Rate: 135 (06/28 0657) Intake/Output from previous day: 06/27 0701 - 06/28 0700 In: 393.2 [I.V.:393.2] Out: 1945 [Urine:1295; Emesis/NG output:650] Intake/Output from this shift:    Labs:  Recent Labs  04/19/16 1041 04/20/16 0218 04/22/16 0355  WBC 8.7 7.4 6.2  HGB 8.6* 8.8* 9.0*  HCT 26.3* 27.2* 28.2*  PLT 240 245 256     Recent Labs  04/19/16 1041 04/20/16 0218 04/21/16 0319 04/22/16 0355  NA 133* 135 137 134*  K 3.4* 3.6 3.8 3.9  CL 95* 95* 96* 96*  CO2 30 31 28 30   GLUCOSE 126* 143* 164* 204*  BUN 32* 34* 35* 35*  CREATININE 1.30* 1.48* 1.59* 1.50*  CALCIUM 9.0 9.1 9.2 8.7*  PROT 5.9*  --   --  5.8*  ALBUMIN 2.7*  --   --  2.6*  AST 21  --   --  34  ALT 15*  --   --  17  ALKPHOS 41  --   --  50  BILITOT 1.3*  --   --  1.7*    Estimated Creatinine Clearance: 70.4 mL/min (by C-G formula based on Cr of 1.5).    Recent Labs  04/21/16 1627 04/21/16 2043 04/22/16 0612  GLUCAP 205* 182* 234*    Medical History: Past Medical History  Diagnosis Date  . Hypertension   . Diabetes mellitus without complication (HCC)   . GERD (gastroesophageal reflux disease)   . Arthritis   . Cough     CHRONIC  . Edema     FEET/LEGS  . 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 Unstable angina (HCC) 04/13/2016  . 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 Type II diabetes mellitus (HCC)   . Essential hypertension   . Mixed hyperlipidemia   . Obesity   . 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 [name] done on 6/20. On 6/23, pt started to become nauseous however still was +BS/+flatus. Despite multiple laxatives - pt was still without  a BM and the abdomen was distended. Abd CT on 6/27 showed an ileus vs SBO and surgery was consulted - recommended NGT decompression and bowel rest. Pharmacy consulted to start [name] for nutritional needs.   GI: [name]. LBM 6/18 - NGT placed 6/26 with 1950 cc output in the first day - down to 1050cc in the past 24 hours. Pt NPO for bowel rest, [name] to start 6/28. Alb 2.6. On reglan, pepcid IV Endo: Hx DM. CBGs/24h: 163-234 - given elevated CBGs, will have to initiate [name] slowly and only titrate up as CBGs are controlled Lytes: Na 134, K 3.9, Mg 2.1 (6/21) - pt is on D5-1/2NS + 20 mEq KCl at 100 cc/hr - will provide 48 mEq KCl/day.  Renal: SCr 1.5 (baseline ~1), CrCl~50 ml/min, UOP not accurately charted.  Pulm: 97%/RA Cards: Hx HTN/DL and s/p [name] x4 on 1/616/20. Post-op Afib noted - on amio drip. BP okay. On atora80,  gemfibrozil Hepatobil: LFTs wnl, TBili 1.7 - will watch for need to adjust TE, TG 193 (6/19) Neuro: Pain score: 3-4 (abdominal) ID: Afebrile, WBC wnl, on no abx Best Practices: Enox 40 (could consider increasing to 0.5 mg/kg for BMI>30) [name] Access: PICC ordered to be placed [name] start date: 6/28  Current Nutrition:  NPO D5-1/2 NS + 20 mEq KCl provides 408 kcal per day  Nutritional Goals:  Awaiting RD assessment  Plan:  - Start Clinimix E 5/15 at 30 ml/hr to provide 511 kcal and 36g protein - IVF with D5 will provide an additional 408 kcal for a total 24h amount of 919 kcal - Add MV/TE to [name] bag - Start 20% lipid emulsion at 5 cc/hr - Add 20 units insulin R to [name] bag  - Adjust CVTS SSI to q4h - Will d/c pecid IVPB and add to [name] bag - Will order [name] labs - Will follow-up [name] tolerance and monitor electrolytes closely for refeeding syndrome.  - Advancement in [name] will be dependent on electrolyte stability and CBG control  Georgina PillionElizabeth Shizuko Wojdyla, PharmD, BCPS Clinical Pharmacist Pager: [phone] 04/22/2016 10:45 AM

## 2016-04-22 NOTE — Progress Notes (Signed)
8 Days Post-Op  Subjective: Patient complains of epigastric abdominal pain and bloating. Little to no flatus. Denies BM. Has nausea. 650 ml NG tube output.   Objective: Vital signs in last 24 hours: Temp:  [98.1 F (36.7 C)-98.3 F (36.8 C)] 98.1 F (36.7 C) (06/28 0657) Pulse Rate:  [119-135] 135 (06/28 0657) Resp:  [15-18] 15 (06/27 2022) BP: (94-116)/(54-71) 94/64 mmHg (06/28 0657) SpO2:  [95 %-97 %] 97 % (06/28 0657) Last BM Date: 04/12/16  Intake/Output from previous day: 06/27 0701 - 06/28 0700 In: 393.2 [I.V.:393.2] Out: 1945 [Urine:1295; Emesis/NG output:650] Intake/Output this shift:   Abdomen: Soft and distended. Tender to palpation in the right and left upper quadrants. No guarding or rigidity.   Lab Results:   Recent Labs  04/20/16 0218 04/22/16 0355  WBC 7.4 6.2  HGB 8.8* 9.0*  HCT 27.2* 28.2*  PLT 245 256   BMET  Recent Labs  04/21/16 0319 04/22/16 0355  NA 137 134*  K 3.8 3.9  CL 96* 96*  CO2 28 30  GLUCOSE 164* 204*  BUN 35* 35*  CREATININE 1.59* 1.50*  CALCIUM 9.2 8.7*   PT/INR No results for input(s): LABPROT, INR in the last 72 hours. ABG No results for input(s): PHART, HCO3 in the last 72 hours.  Invalid input(s): PCO2, PO2  Studies/Results: Dg Abd 2 Views  04/20/2016  CLINICAL DATA:  Nausea, abdominal distention. EXAM: ABDOMEN - 2 VIEW COMPARISON:  Radiograph of April 19, 2016. FINDINGS: Status post cholecystectomy. There remains moderately dilated small bowel loops with air filled right colon. The degree of small bowel dilatation is not significantly changed compared to prior exam. Phleboliths are noted in the pelvis. IMPRESSION: Stable moderate dilatation of small bowel loops is noted which may represent ileus, although distal small bowel obstruction cannot be excluded. Continued radiographic follow-up is recommended. Electronically Signed   By: Lupita RaiderJames  Green Jr, M.D.   On: 04/20/2016 08:25   Dg Abd Portable 1v  04/21/2016  CLINICAL  DATA:  Ileus.  Abdominal distention. EXAM: PORTABLE ABDOMEN - 1 VIEW COMPARISON:  04/20/2016. FINDINGS: Surgical clips right upper quadrant. NG tube in stable position. Again noted are multiple dilated loops of bowel throughout the abdomen. These findings are most consistent with adynamic ileus. Continued follow-up exam suggested demonstrate clearing and to exclude bowel obstruction. IMPRESSION: 1. NG tube in stable position with tip in the stomach. 2. Persistent unchanged dilated loops of bowel most consistent with adynamic ileus. Continued follow-up exams suggested to demonstrate resolution and to exclude bowel obstruction. Electronically Signed   By: Maisie Fushomas  Register   On: 04/21/2016 07:40   Dg Abd Portable 1v  04/20/2016  CLINICAL DATA:  Encounter for nasogastric tube placement. EXAM: PORTABLE ABDOMEN - 1 VIEW COMPARISON:  04/20/2016. FINDINGS: The nasogastric tube is in the upper abdomen. Tip is in the proximal stomach region. There continues to be gaseous distension of the stomach and multiple small bowel loops. IMPRESSION: Nasogastric tube in the proximal stomach region. Persistent gas-filled loops of bowel throughout the abdomen. Electronically Signed   By: Richarda OverlieAdam  Henn M.D.   On: 04/20/2016 14:49    Anti-infectives: Anti-infectives    Start     Dose/Rate Route Frequency Ordered Stop   04/15/16 2130  vancomycin (VANCOCIN) IVPB 1000 mg/200 mL premix  Status:  Discontinued     1,000 mg 200 mL/hr over 60 Minutes Intravenous  Once 04/14/16 1626 04/15/16 1107   04/15/16 1200  vancomycin (VANCOCIN) IVPB 1000 mg/200 mL premix  1,000 mg 200 mL/hr over 60 Minutes Intravenous  Once 04/15/16 1107 04/15/16 1255   04/15/16 1000  levofloxacin (LEVAQUIN) IVPB 750 mg     750 mg 100 mL/hr over 90 Minutes Intravenous Every 24 hours 04/14/16 1626 04/15/16 1058   04/14/16 0400  vancomycin (VANCOCIN) 1,500 mg in sodium chloride 0.9 % 250 mL IVPB     1,500 mg 125 mL/hr over 120 Minutes Intravenous To Surgery  04/13/16 1810 04/14/16 1145   04/14/16 0400  vancomycin (VANCOCIN) 1,000 mg in sodium chloride 0.9 % 1,000 mL irrigation      Irrigation To Surgery 04/13/16 1809 04/14/16 1355   04/14/16 0400  levofloxacin (LEVAQUIN) IVPB 500 mg     500 mg 100 mL/hr over 60 Minutes Intravenous To Surgery 04/13/16 1810 04/14/16 1153      Assessment/Plan: SBO vs. Ileus, most likely ileus  Continue with NGT decompression, bowel rest and reglan FEN: Start [name]  Dispo: SBO vs.Ileus    Orvil FeilJaclyn M Gisele Pack 04/22/2016

## 2016-04-23 ENCOUNTER — Inpatient Hospital Stay (HOSPITAL_COMMUNITY): Payer: Medicare Other

## 2016-04-23 LAB — COMPREHENSIVE METABOLIC PANEL
ALT: 17 U/L (ref 17–63)
ANION GAP: 8 (ref 5–15)
AST: 30 U/L (ref 15–41)
Albumin: 2.6 g/dL — ABNORMAL LOW (ref 3.5–5.0)
Alkaline Phosphatase: 46 U/L (ref 38–126)
BILIRUBIN TOTAL: 1.7 mg/dL — AB (ref 0.3–1.2)
BUN: 25 mg/dL — ABNORMAL HIGH (ref 6–20)
CALCIUM: 8.7 mg/dL — AB (ref 8.9–10.3)
CHLORIDE: 99 mmol/L — AB (ref 101–111)
CO2: 30 mmol/L (ref 22–32)
CREATININE: 1.11 mg/dL (ref 0.61–1.24)
Glucose, Bld: 179 mg/dL — ABNORMAL HIGH (ref 65–99)
POTASSIUM: 3.2 mmol/L — AB (ref 3.5–5.1)
Sodium: 137 mmol/L (ref 135–145)
Total Protein: 5.9 g/dL — ABNORMAL LOW (ref 6.5–8.1)

## 2016-04-23 LAB — CBC
HCT: 28.1 % — ABNORMAL LOW (ref 39.0–52.0)
Hemoglobin: 8.8 g/dL — ABNORMAL LOW (ref 13.0–17.0)
MCH: 29.1 pg (ref 26.0–34.0)
MCHC: 31.3 g/dL (ref 30.0–36.0)
MCV: 93 fL (ref 78.0–100.0)
Platelets: 262 10*3/uL (ref 150–400)
RBC: 3.02 MIL/uL — ABNORMAL LOW (ref 4.22–5.81)
RDW: 14 % (ref 11.5–15.5)
WBC: 5.4 10*3/uL (ref 4.0–10.5)

## 2016-04-23 LAB — DIFFERENTIAL
BASOS ABS: 0 10*3/uL (ref 0.0–0.1)
BASOS PCT: 0 %
EOS ABS: 0.2 10*3/uL (ref 0.0–0.7)
EOS PCT: 3 %
Lymphocytes Relative: 16 %
Lymphs Abs: 0.9 10*3/uL (ref 0.7–4.0)
MONO ABS: 1 10*3/uL (ref 0.1–1.0)
MONOS PCT: 19 %
Neutro Abs: 3.3 10*3/uL (ref 1.7–7.7)
Neutrophils Relative %: 62 %

## 2016-04-23 LAB — GLUCOSE, CAPILLARY
GLUCOSE-CAPILLARY: 170 mg/dL — AB (ref 65–99)
GLUCOSE-CAPILLARY: 163 mg/dL — AB (ref 65–99)
GLUCOSE-CAPILLARY: 194 mg/dL — AB (ref 65–99)
Glucose-Capillary: 177 mg/dL — ABNORMAL HIGH (ref 65–99)
Glucose-Capillary: 195 mg/dL — ABNORMAL HIGH (ref 65–99)

## 2016-04-23 LAB — PREALBUMIN: PREALBUMIN: 9.4 mg/dL — AB (ref 18–38)

## 2016-04-23 LAB — PHOSPHORUS: PHOSPHORUS: 2 mg/dL — AB (ref 2.5–4.6)

## 2016-04-23 LAB — MAGNESIUM: MAGNESIUM: 1.9 mg/dL (ref 1.7–2.4)

## 2016-04-23 LAB — TRIGLYCERIDES: Triglycerides: 135 mg/dL (ref <150)

## 2016-04-23 MED ORDER — MAGNESIUM SULFATE 2 GM/50ML IV SOLN
2.0000 g | Freq: Once | INTRAVENOUS | Status: AC
  Administered 2016-04-23: 2 g via INTRAVENOUS
  Filled 2016-04-23: qty 50

## 2016-04-23 MED ORDER — POTASSIUM PHOSPHATES 15 MMOLE/5ML IV SOLN
20.0000 mmol | Freq: Once | INTRAVENOUS | Status: AC
  Administered 2016-04-23: 20 mmol via INTRAVENOUS
  Filled 2016-04-23: qty 6.67

## 2016-04-23 MED ORDER — TRACE MINERALS CR-CU-MN-SE-ZN 10-1000-500-60 MCG/ML IV SOLN
INTRAVENOUS | Status: AC
  Administered 2016-04-23: 18:00:00 via INTRAVENOUS
  Filled 2016-04-23: qty 960

## 2016-04-23 MED ORDER — POTASSIUM CHLORIDE 10 MEQ/50ML IV SOLN
10.0000 meq | INTRAVENOUS | Status: AC
  Administered 2016-04-23 (×3): 10 meq via INTRAVENOUS
  Filled 2016-04-23 (×3): qty 50

## 2016-04-23 MED ORDER — FAT EMULSION 20 % IV EMUL
168.0000 mL | INTRAVENOUS | Status: AC
  Administered 2016-04-23: 168 mL via INTRAVENOUS
  Filled 2016-04-23: qty 250

## 2016-04-23 NOTE — Progress Notes (Signed)
Patient ID: Jason [name], male   DOB: 1951-07-07, 65 y.o.   MRN: [id number]     [address] [place], [place] [address]    Phone: [phone] FAX: [phone]     Subjective: bm after tylenol suppository was given. Flatus. 724m ngt output. Walked this am, more active.   Objective:  Vital signs:  Filed Vitals:   04/22/16 0657 04/22/16 1134 04/22/16 2026 04/23/16 0445  BP: 94/64 102/66 107/60 115/55  Pulse: 135 109 108 101  Temp: 98.1 F (36.7 C) 98 F (36.7 C) 97.5 F (36.4 C) 97.7 F (36.5 C)  TempSrc: Oral Oral Oral Oral  Resp:  18 22   Height:      Weight:    126.191 kg (278 lb 3.2 oz)  SpO2: 97%  98% 97%    Last BM Date: 04/23/16  Intake/Output   Yesterday:  06/28 0701 - 06/29 0700 In: 0  Out: 2350 [Urine:1650; Emesis/NG output:700] This shift:  Total I/O In: -  Out: 1000 [Emesis/NG output:1000]    Physical Exam: General: Pt awake/alert/oriented x4 in no acute distress  Abdomen: Soft.  distended.  Soft and reducible umbilical hernia.  Periumbilical ttp.  No evidence of peritonitis.  No incarcerated hernias.    Problem List:   Principal Problem:   S/P [name] Active Problems:   Unstable angina (HCC)   Left main coronary artery disease   Coronary artery disease involving native coronary artery with unstable angina pectoris [place]   Type II diabetes mellitus ([place]   Essential hypertension   Mixed hyperlipidemia   Obesity    Results:   Labs: Results for orders placed or performed during the hospital encounter of 04/13/16 (from the past 48 hour(s))  Glucose, capillary     Status: Abnormal   Collection Time: 04/21/16 11:11 AM  Result Value Ref Range   Glucose-Capillary 194 (H) 65 - 99 mg/dL   Comment 1 Notify RN    Comment 2 Document in Chart   Glucose, capillary     Status: Abnormal   Collection Time: 04/21/16  4:27 PM  Result Value Ref Range   Glucose-Capillary 205 (H)  65 - 99 mg/dL  Glucose, capillary     Status: Abnormal   Collection Time: 04/21/16  8:43 PM  Result Value Ref Range   Glucose-Capillary 182 (H) 65 - 99 mg/dL  Comprehensive metabolic panel     Status: Abnormal   Collection Time: 04/22/16  3:55 AM  Result Value Ref Range   Sodium 134 (L) 135 - 145 mmol/L   Potassium 3.9 3.5 - 5.1 mmol/L   Chloride 96 (L) 101 - 111 mmol/L   CO2 30 22 - 32 mmol/L   Glucose, Bld 204 (H) 65 - 99 mg/dL   BUN 35 (H) 6 - 20 mg/dL   Creatinine, Ser 1.50 (H) 0.61 - 1.24 mg/dL   Calcium 8.7 (L) 8.9 - 10.3 mg/dL   Total Protein 5.8 (L) 6.5 - 8.1 g/dL   Albumin 2.6 (L) 3.5 - 5.0 g/dL   AST 34 15 - 41 U/L   ALT 17 17 - 63 U/L   Alkaline Phosphatase 50 38 - 126 U/L   Total Bilirubin 1.7 (H) 0.3 - 1.2 mg/dL   GFR calc non Af Amer 47 (L) >60 mL/min   GFR calc Af Amer 54 (L) >60 mL/min    Comment: (NOTE) The eGFR has been calculated using the  CKD EPI equation. This calculation has not been validated in all clinical situations. eGFR's persistently <60 mL/min signify possible Chronic Kidney Disease.    Anion gap 8 5 - 15  CBC     Status: Abnormal   Collection Time: 04/22/16  3:55 AM  Result Value Ref Range   WBC 6.2 4.0 - 10.5 K/uL   RBC 3.09 (L) 4.22 - 5.81 MIL/uL   Hemoglobin 9.0 (L) 13.0 - 17.0 g/dL   HCT 28.2 (L) 39.0 - 52.0 %   MCV 91.3 78.0 - 100.0 fL   MCH 29.1 26.0 - 34.0 pg   MCHC 31.9 30.0 - 36.0 g/dL   RDW 14.2 11.5 - 15.5 %   Platelets 256 150 - 400 K/uL  Glucose, capillary     Status: Abnormal   Collection Time: 04/22/16  6:12 AM  Result Value Ref Range   Glucose-Capillary 234 (H) 65 - 99 mg/dL  Glucose, capillary     Status: Abnormal   Collection Time: 04/22/16 11:45 AM  Result Value Ref Range   Glucose-Capillary 206 (H) 65 - 99 mg/dL   Comment 1 Notify RN    Comment 2 Document in Chart   Glucose, capillary     Status: Abnormal   Collection Time: 04/22/16  4:09 PM  Result Value Ref Range   Glucose-Capillary 218 (H) 65 - 99 mg/dL    Comment 1 Notify RN    Comment 2 Document in Chart   Glucose, capillary     Status: Abnormal   Collection Time: 04/22/16  9:55 PM  Result Value Ref Range   Glucose-Capillary 194 (H) 65 - 99 mg/dL   Comment 1 Notify RN   Glucose, capillary     Status: Abnormal   Collection Time: 04/22/16 11:46 PM  Result Value Ref Range   Glucose-Capillary 207 (H) 65 - 99 mg/dL  Glucose, capillary     Status: Abnormal   Collection Time: 04/23/16  4:28 AM  Result Value Ref Range   Glucose-Capillary 170 (H) 65 - 99 mg/dL   Comment 1 Notify RN   CBC     Status: Abnormal   Collection Time: 04/23/16  5:05 AM  Result Value Ref Range   WBC 5.4 4.0 - 10.5 K/uL   RBC 3.02 (L) 4.22 - 5.81 MIL/uL   Hemoglobin 8.8 (L) 13.0 - 17.0 g/dL   HCT 28.1 (L) 39.0 - 52.0 %   MCV 93.0 78.0 - 100.0 fL   MCH 29.1 26.0 - 34.0 pg   MCHC 31.3 30.0 - 36.0 g/dL   RDW 14.0 11.5 - 15.5 %   Platelets 262 150 - 400 K/uL  Prealbumin     Status: Abnormal   Collection Time: 04/23/16  5:05 AM  Result Value Ref Range   Prealbumin 9.4 (L) 18 - 38 mg/dL  Triglycerides     Status: None   Collection Time: 04/23/16  5:05 AM  Result Value Ref Range   Triglycerides 135 <150 mg/dL  Differential     Status: None   Collection Time: 04/23/16  5:05 AM  Result Value Ref Range   Neutrophils Relative % 62 %   Neutro Abs 3.3 1.7 - 7.7 K/uL   Lymphocytes Relative 16 %   Lymphs Abs 0.9 0.7 - 4.0 K/uL   Monocytes Relative 19 %   Monocytes Absolute 1.0 0.1 - 1.0 K/uL   Eosinophils Relative 3 %   Eosinophils Absolute 0.2 0.0 - 0.7 K/uL   Basophils Relative 0 %   Basophils  Absolute 0.0 0.0 - 0.1 K/uL  Comprehensive metabolic panel     Status: Abnormal   Collection Time: 04/23/16  5:05 AM  Result Value Ref Range   Sodium 137 135 - 145 mmol/L   Potassium 3.2 (L) 3.5 - 5.1 mmol/L    Comment: DELTA CHECK NOTED   Chloride 99 (L) 101 - 111 mmol/L   CO2 30 22 - 32 mmol/L   Glucose, Bld 179 (H) 65 - 99 mg/dL   BUN 25 (H) 6 - 20 mg/dL    Creatinine, Ser 1.11 0.61 - 1.24 mg/dL   Calcium 8.7 (L) 8.9 - 10.3 mg/dL   Total Protein 5.9 (L) 6.5 - 8.1 g/dL   Albumin 2.6 (L) 3.5 - 5.0 g/dL   AST 30 15 - 41 U/L   ALT 17 17 - 63 U/L   Alkaline Phosphatase 46 38 - 126 U/L   Total Bilirubin 1.7 (H) 0.3 - 1.2 mg/dL   GFR calc non Af Amer >60 >60 mL/min   GFR calc Af Amer >60 >60 mL/min    Comment: (NOTE) The eGFR has been calculated using the CKD EPI equation. This calculation has not been validated in all clinical situations. eGFR's persistently <60 mL/min signify possible Chronic Kidney Disease.    Anion gap 8 5 - 15  Magnesium     Status: None   Collection Time: 04/23/16  5:05 AM  Result Value Ref Range   Magnesium 1.9 1.7 - 2.4 mg/dL  Phosphorus     Status: Abnormal   Collection Time: 04/23/16  5:05 AM  Result Value Ref Range   Phosphorus 2.0 (L) 2.5 - 4.6 mg/dL  Glucose, capillary     Status: Abnormal   Collection Time: 04/23/16  8:31 AM  Result Value Ref Range   Glucose-Capillary 177 (H) 65 - 99 mg/dL    Imaging / Studies: Dg Chest Port 1 View  04/23/2016  CLINICAL DATA:  Shortness of Breath EXAM: PORTABLE CHEST 1 VIEW COMPARISON:  04/17/2016 FINDINGS: Right-sided PICC line and nasogastric catheter are now seen in satisfactory position. Cardiac shadow is stable. Postoperative changes are again seen. The lungs are well aerated bilaterally. No focal infiltrate, effusion or pneumothorax is seen. IMPRESSION: Tubes and lines as described above.  No acute abnormality noted. Electronically Signed   By: Inez Catalina M.D.   On: 04/23/2016 07:48   Dg Abd Portable 1v  04/23/2016  CLINICAL DATA:  Status post [name], abdominal distention, ileus pattern EXAM: PORTABLE ABDOMEN - 1 VIEW COMPARISON:  Portable abdominal radiograph of April 21, 2016 FINDINGS: There remain loops of moderately distended gas-filled small bowel in the mid and upper abdomen. There is some gas in the right colon. A small amount of gas is present in the rectum. The  nasogastric tube is not visible on this study. IMPRESSION: Persistent ileus. The positioning of the nasogastric tube cannot be assessed on this abdominal radiograph. However, on the accompanying portable chest x-ray of today's date the proximal port of the nasogastric tube can be seen to lie above the expected location of the GE junction. The tip is just inside the gastric cardia. Advancement of the nasogastric tube by 15-20 cm is recommended to assure proper positioning and function of the tube. Electronically Signed   By: David  Martinique M.D.   On: 04/23/2016 07:48    Medications / Allergies:  Scheduled Meds: . atorvastatin  80 mg Oral q1800  . bisacodyl  10 mg Oral Daily   Or  . bisacodyl  10 mg Rectal Daily  . enoxaparin (LOVENOX) injection  40 mg Subcutaneous Q24H  . gemfibrozil  600 mg Oral BID AC  . insulin aspart  0-24 Units Subcutaneous Q4H  . magnesium sulfate 1 - 4 g bolus IVPB  2 g Intravenous Once  . metoCLOPramide (REGLAN) injection  5 mg Intravenous Q6H  . potassium chloride  10 mEq Intravenous Q1 Hr x 3  . potassium phosphate IVPB (mmol)  20 mmol Intravenous Once  . sodium chloride flush  3 mL Intravenous Q12H   Continuous Infusions: . amiodarone 15 mg/hr (04/23/16 0932)  . dextrose 5 % and 0.45 % NaCl with KCl 20 mEq/L 40 mL/hr at 04/23/16 0800  . Marland KitchenTPN (CLINIMIX-E) Adult 30 mL/hr at 04/22/16 1813   And  . fat emulsion 120 mL (04/22/16 1750)  . Marland KitchenTPN (CLINIMIX-E) Adult     And  . fat emulsion     PRN Meds:.acetaminophen, antiseptic oral rinse, gi cocktail, lactulose, LORazepam, metoprolol, ondansetron (ZOFRAN) IV, oxyCODONE, phenol, sodium chloride flush, sodium chloride flush, traMADol  Antibiotics: Anti-infectives    Start     Dose/Rate Route Frequency Ordered Stop   04/15/16 2130  vancomycin (VANCOCIN) IVPB 1000 mg/200 mL premix  Status:  Discontinued     1,000 mg 200 mL/hr over 60 Minutes Intravenous  Once 04/14/16 1626 04/15/16 1107   04/15/16 1200  vancomycin  (VANCOCIN) IVPB 1000 mg/200 mL premix     1,000 mg 200 mL/hr over 60 Minutes Intravenous  Once 04/15/16 1107 04/15/16 1255   04/15/16 1000  levofloxacin (LEVAQUIN) IVPB 750 mg     750 mg 100 mL/hr over 90 Minutes Intravenous Every 24 hours 04/14/16 1626 04/15/16 1058   04/14/16 0400  vancomycin (VANCOCIN) 1,500 mg in sodium chloride 0.9 % 250 mL IVPB     1,500 mg 125 mL/hr over 120 Minutes Intravenous To Surgery 04/13/16 1810 04/14/16 1145   04/14/16 0400  vancomycin (VANCOCIN) 1,000 mg in sodium chloride 0.9 % 1,000 mL irrigation      Irrigation To Surgery 04/13/16 1809 04/14/16 1355   04/14/16 0400  levofloxacin (LEVAQUIN) IVPB 500 mg     500 mg 100 mL/hr over 60 Minutes Intravenous To Surgery 04/13/16 1810 04/14/16 1153        Assessment/Plan: SBO vs. Ileus, most likely ileus  Clinically improved, will hold off on ct scan, continue with NGT decompression, bowel rest and reglan PCM-[name].  Prealbumin 9   Keland Peyton, [facility] Surgery Pager [phone](7A-4:30P) For consults and floor pages call [phone](7A-4:30P)  04/23/2016 10:17 AM

## 2016-04-23 NOTE — Progress Notes (Signed)
CARDIAC REHAB PHASE I   PRE:  Rate/Rhythm: 103 ST  BP:  Supine: 121/63  Sitting:   Standing:    SaO2: 96%RA  MODE:  Ambulation: 340 ft   POST:  Rate/Rhythm: 126 ST PVCs  BP:  Supine:   Sitting: 127/71  Standing:    SaO2: 96%RA 0925-0956 Pt walked 340 ft on RA with rolling walker and  asst x 2 due to equipment. Pt encouraged to go farther and take a couple of standing rest breaks. Tolerated increase in distance well but tired by end of walk. To recliner with call bell. Encouraged more walks today. Pt seemed stronger today. Heart rate elevated but appears NSR still.   Luetta Nuttingharlene Zsofia Prout, RN BSN  04/23/2016 9:53 AM

## 2016-04-23 NOTE — Progress Notes (Signed)
Initial Nutrition Assessment  DOCUMENTATION CODES:   Obesity unspecified  INTERVENTION:    [name] per pharmacy  NUTRITION DIAGNOSIS:   Inadequate oral intake related to altered GI function as evidenced by NPO status  GOAL:   Patient will meet greater than or equal to 90% of their needs  MONITOR:   Diet advancement, Labs, Weight trends, I & O's, [name] prescription  REASON FOR ASSESSMENT:   Consult New [name]/TNA  ASSESSMENT:   65 yo Male with no previous history of coronary artery disease but risk factors notable for history of essential hypertension, type 2 diabetes mellitus, and mixed hyperlipidemia who presents with symptoms of chest pain consistent with unstable angina pectoris and has been [facility] for management of 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   Patient s/p procedure 6/20: CORONARY BYPASS GRAFTING x 4  Spoke with patient's wife >> reports pt was eating well PTA. [name] initiated 6/28 for ileus vs SBO. NGT in place to LIS >> + flatus. Nutrition focused physical exam completed.  No muscle or subcutaneous fat depletion noticed.  Patient is receiving [name] with Clinimix E 5/15 @ 40 ml/hr and lipids @ 7 ml/hr. Provides 1018 kcal and 48 grams protein per day. Meets 46% minimum estimated energy needs and 44% minimum estimated protein needs.  Diet Order:  Diet NPO time specified [name] (CLINIMIX-E) Adult [name] (CLINIMIX-E) Adult  Skin:  Reviewed, no issues  Last BM:  6/29  Height:   Ht Readings from Last 1 Encounters:  04/13/16 6\' 4"  (1.93 m)    Weight:   Wt Readings from Last 1 Encounters:  04/23/16 278 lb 3.2 oz (126.191 kg)    Ideal Body Weight:  92 kg  BMI:  Body mass index is 33.88 kg/(m^2).  Estimated Nutritional Needs:   Kcal:  2200-2400  Protein:  110-120 gm  Fluid:  2.2-2.4 L  EDUCATION NEEDS:   No education needs identified at this time  Maureen ChattersKatie Justan Gaede, RD, LDN Pager #: [phone] After-Hours Pager #:  [phone]

## 2016-04-23 NOTE — Progress Notes (Signed)
PARENTERAL NUTRITION CONSULT NOTE - FOLLOW-UP  Pharmacy Consult for [name] Indication: [name]  Allergies  Allergen Reactions  . Aspirin Anaphylaxis  . Inderal [Propranolol]     Other reaction(s): Other (See Comments) Fatigue  . Lovastatin     Other reaction(s): Muscle Pain  . Metformin Hcl Diarrhea  . Penicillin G Hives  . Pravastatin Sodium     Other reaction(s): Muscle Pain  . Simvastatin     Other reaction(s): Muscle Pain  . Penicillins Rash    Has patient had a PCN reaction causing immediate rash, facial/tongue/throat swelling, SOB or lightheadedness with hypotension: Yes Has patient had a PCN reaction causing severe rash involving mucus membranes or skin necrosis: Yes Has patient had a PCN reaction that required hospitalization No Has patient had a PCN reaction occurring within the last 10 years: Yes If all of the above answers are "NO", then may proceed with Cephalosporin use.     Patient Measurements: Height: '6\' 4"'$  (193 cm) Weight: 278 lb 3.2 oz (126.191 kg) IBW/kg (Calculated) : 86.8  Vital Signs: Temp: 97.7 F (36.5 C) (06/29 0445) Temp Source: Oral (06/29 0445) BP: 115/55 mmHg (06/29 0445) Pulse Rate: 101 (06/29 0445) Intake/Output from previous day: 06/28 0701 - 06/29 0700 In: 0  Out: 1350 [Urine:650; Emesis/NG output:700] Intake/Output from this shift:    Labs:  Recent Labs  04/22/16 0355 04/23/16 0505  WBC 6.2 5.4  HGB 9.0* 8.8*  HCT 28.2* 28.1*  PLT 256 262     Recent Labs  04/21/16 0319 04/22/16 0355 04/23/16 0505  NA 137 134* 137  K 3.8 3.9 3.2*  CL 96* 96* 99*  CO2 '28 30 30  '$ GLUCOSE 164* 204* 179*  BUN 35* 35* 25*  CREATININE 1.59* 1.50* 1.11  CALCIUM 9.2 8.7* 8.7*  MG  --   --  1.9  PHOS  --   --  2.0*  PROT  --  5.8* 5.9*  ALBUMIN  --  2.6* 2.6*  AST  --  34 30  ALT  --  17 17  ALKPHOS  --  50 46  BILITOT  --  1.7* 1.7*  PREALBUMIN  --   --  9.4*  TRIG  --   --  135   Estimated Creatinine Clearance: 95 mL/min (by  C-G formula based on Cr of 1.11).    Recent Labs  04/22/16 2155 04/22/16 2346 04/23/16 0428  GLUCAP 194* 207* 170*    Medical History: Past Medical History  Diagnosis Date  . Hypertension   . Diabetes mellitus without complication ([place])   . GERD (gastroesophageal reflux disease)   . Arthritis   . Cough     CHRONIC  . Edema     FEET/LEGS  . 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 Unstable angina ([place]) 04/13/2016  . Coronary artery disease involving native coronary artery with unstable angina pectoris ([place]) 04/13/2016  . Type II diabetes mellitus ([place])   . Essential hypertension   . Mixed hyperlipidemia   . Obesity   . S/P [name] 04/14/2016    LIMA to LAD, SVG to D1, SVG to OM, SVG to PDA, EVH via right thigh and leg    Insulin Requirements in the past 12 hours:  20 units insulin R in [name] 16 units SSI in the past 12 hours since [name] hung  Assessment: [name] transferred to [facility] on 6/[facility] for [name] in setting of 3vCAD. [name] done on 6/20. On 6/23, pt  started to become nauseous however still was +BS/+flatus. Despite multiple laxatives - pt was still without a BM and the abdomen was distended. Abd CT on 6/27 showed an ileus vs SBO and surgery was consulted - recommended NGT decompression and bowel rest. Pharmacy consulted to start [name] for nutritional needs.   GI: [name]. LBM 6/18 - NGT placed 6/26 with 1950 cc output in the first day - down to 700cc in the past 24 hours. Pt NPO for bowel rest, [name] started 6/28. Pt noted to have +flatus and small amount of stool on 6/29 - continuing bowel rest for now.  Alb 2.6. Pre-albumin 9.4 (6/29) On reglan, pepcid IV Endo: Hx DM. CBGs/12h: 179-207 - given elevated CBGs, will continue [name] at the lower rate today and only titrate up as CBGs are controlled Lytes: Na 134, K 3.2 (goal of >/=4 with ileus), Mg 1.9 (goal of >/=2 with ileus), Phos 2 - pt is on D5-1/2NS + 20 mEq KCl at 100 cc/hr - will provide ~20 mEq KCl/day. Will give  additional KCl along with Mg/Phos today. Given drop in electrolytes - will keep [name] at a low rate today and will recheck lytes in the AM Renal: SCr 1.11 << 1.5 (baseline ~1), CrCl~50 ml/min, UOP not accurately charted.  Pulm: 97%/RA Cards: Hx HTN/DL and s/p [name] x4 on 6/20. Post-op Afib noted - on amio drip. BP okay. On atora80, gemfibrozil Hepatobil: LFTs/Alk Phos wnl, TBili 1.7 - will watch for need to adjust TE, TG 135 (6/29) Neuro: Pain score: 6(abdominal) ID: Afebrile, WBC wnl, on no abx Best Practices: Enox 40 (could consider increasing to 0.5 mg/kg for BMI>30) [name] Access: R-double lumen PICC placed 6/28 [name] start date: 6/28  Current Nutrition:  NPO D5-1/2 NS + 20 mEq KCl at 40 cc/hr provides 163 kcal per day  Nutritional Goals:  Awaiting RD assessment  Plan:  - Increase Clinimix E 5/15 at 40 ml/hr (to utilize all of the 1L bag) - Increase 20% lipid emulsion to 7 cc/hr - IVF with D5 will provide an additional 163 kcal - Clinimix + lipids + IVF will provide 1180 kcal and 48g protein per day - Add MV/TE to [name] bag - Increase insulin R to 30 units in [name] bag - Continue with famotidine 40 mg/day in [name] bag - Continue CVTS SSI q4h - KPhos 20 mmol x 1 (will provide 20 mmol of Phos and ~30 mEq of KCl) - KCl 10 mEq IV x 3 runs  - Magnesium 2g IV x 1 dose - F/u BMET, Mg, Phos on 6/30 - Will follow-up [name] tolerance and monitor electrolytes closely for refeeding syndrome.  - Advancement in [name] will be dependent on electrolyte stability and CBG control  Alycia Rossetti, PharmD, BCPS Clinical Pharmacist Pager: [phone] 04/23/2016 7:27 AM

## 2016-04-23 NOTE — Clinical Documentation Improvement (Signed)
Cardiothoracic  Based on the clinical findings below, please document any associated diagnoses/conditions the patient has or may have.   Ileus, post operative complication  Ileus, obstructive  Other  Clinically Undetermined  Supporting Information:  [name] 04/14/16 04/20/16  Consult: scheduled antiemetics which failed to control his nausea. Abd films revealed moderately dilated small bowel loops with air in R colon and general surgery asked to consult. NG placed    SBO v ileus  04/21/16  Note: Abdomen still distended. Continue ng. On reglan for possible ileus SBO v ileus, more likely an ileus. Continue with NGT decompression, bowel rest, start Reglan 04/22/16 note: slight clinical improvement with ileus Bowel sounds are severely hypo-active and abdomen remains distended consistent with severe ileus-no guarding  Please exercise your independent, professional judgment when responding. A specific answer is not anticipated or expected. Please update your documentation within the medical record to reflect your response to this query. Thank you  Thank Barrie DunkerYou, Camiya Vinal C Katey Barrie Health Information Management [place] [phone]

## 2016-04-23 NOTE — Progress Notes (Signed)
Utilization review completed.  

## 2016-04-23 NOTE — Progress Notes (Addendum)
9 Days Post-Op Procedure(s) (LRB): CORONARY ARTERY BYPASS GRAFTING TIMES FOUR    USING LEFT INTERNAL MAMMARY AND ENDOSCOPIC HARVEST RIGHT SAPHENOUS VEIN (N/A) INTRAOPERATIVE TRANSESOPHAGEAL ECHOCARDIOGRAM (N/A) Subjective: Feeling a bit better, + flatus, small amt of stooling  Objective: Vital signs in last 24 hours: Temp:  [97.5 F (36.4 C)-98 F (36.7 C)] 97.7 F (36.5 C) (06/29 0445) Pulse Rate:  [101-109] 101 (06/29 0445) Cardiac Rhythm:  [-] Sinus tachycardia (06/28 1900) Resp:  [18-22] 22 (06/28 2026) BP: (102-115)/(55-66) 115/55 mmHg (06/29 0445) SpO2:  [97 %-98 %] 97 % (06/29 0445) Weight:  [278 lb 3.2 oz (126.191 kg)] 278 lb 3.2 oz (126.191 kg) (06/29 0445)  Hemodynamic parameters for last 24 hours:    Intake/Output from previous day: 06/28 0701 - 06/29 0700 In: 0  Out: 1350 [Urine:650; Emesis/NG output:700] Intake/Output this shift:    General appearance: alert, cooperative, fatigued and no distress Heart: regular rate and rhythm Lungs: clear to auscultation bilaterally Abdomen: + BS, soft, less distended Extremities: + edema Wound: incis healing well, right thigh echymosis is stable  Lab Results:  Recent Labs  04/22/16 0355 04/23/16 0505  WBC 6.2 5.4  HGB 9.0* 8.8*  HCT 28.2* 28.1*  PLT 256 262   BMET:  Recent Labs  04/22/16 0355 04/23/16 0505  NA 134* 137  K 3.9 3.2*  CL 96* 99*  CO2 30 30  GLUCOSE 204* 179*  BUN 35* 25*  CREATININE 1.50* 1.11  CALCIUM 8.7* 8.7*    PT/INR: No results for input(s): LABPROT, INR in the last 72 hours. ABG    Component Value Date/Time   PHART 7.328* 04/14/2016 2006   HCO3 25.0* 04/14/2016 2006   TCO2 26 04/15/2016 1707   ACIDBASEDEF 1.0 04/14/2016 2006   O2SAT 91.0 04/14/2016 2006   CBG (last 3)   Recent Labs  04/22/16 2155 04/22/16 2346 04/23/16 0428  GLUCAP 194* 207* 170*    Meds Scheduled Meds: . atorvastatin  80 mg Oral q1800  . bisacodyl  10 mg Oral Daily   Or  . bisacodyl  10 mg  Rectal Daily  . enoxaparin (LOVENOX) injection  40 mg Subcutaneous Q24H  . gemfibrozil  600 mg Oral BID AC  . insulin aspart  0-24 Units Subcutaneous Q4H  . metoCLOPramide (REGLAN) injection  5 mg Intravenous Q6H  . sodium chloride flush  3 mL Intravenous Q12H   Continuous Infusions: . amiodarone 30 mg/hr (04/22/16 2218)  . dextrose 5 % and 0.45 % NaCl with KCl 20 mEq/L 40 mL/hr at 04/22/16 1901  . Marland Kitchen.[name] (CLINIMIX-E) Adult 30 mL/hr at 04/22/16 1813   And  . fat emulsion 120 mL (04/22/16 1750)   PRN Meds:.acetaminophen, antiseptic oral rinse, gi cocktail, lactulose, LORazepam, metoprolol, ondansetron (ZOFRAN) IV, oxyCODONE, phenol, sodium chloride flush, sodium chloride flush, traMADol  Xrays No results found.  Assessment/Plan: S/P Procedure(s) (LRB): CORONARY ARTERY BYPASS GRAFTING TIMES FOUR    USING LEFT INTERNAL MAMMARY AND ENDOSCOPIC HARVEST RIGHT SAPHENOUS VEIN (N/A) INTRAOPERATIVE TRANSESOPHAGEAL ECHOCARDIOGRAM (N/A)   1 Ileus clinically improving- cont NGT/TNA for now 2 in sinus QTc around 500- will decrease amio dose 3 push rehab as able 4 creat improved 5 K+ decreased- replace in TNA   LOS: 10 days    [name],[name] 04/23/2016 Postop paralytic ileus vs SBO, suspect ileus  Abdomen improved, some increase in bowel sounds Still afib I have seen and examined Jason [name] and agree with the above assessment  and plan.  Delight OvensEdward B Jason Zemaitis MD [place]  [phone] Office [phone] 04/23/2016 7:09 PM

## 2016-04-24 LAB — CBC
HCT: 26.3 % — ABNORMAL LOW (ref 39.0–52.0)
Hemoglobin: 8.3 g/dL — ABNORMAL LOW (ref 13.0–17.0)
MCH: 29.9 pg (ref 26.0–34.0)
MCHC: 31.6 g/dL (ref 30.0–36.0)
MCV: 94.6 fL (ref 78.0–100.0)
Platelets: 236 10*3/uL (ref 150–400)
RBC: 2.78 MIL/uL — ABNORMAL LOW (ref 4.22–5.81)
RDW: 14.1 % (ref 11.5–15.5)
WBC: 7.7 10*3/uL (ref 4.0–10.5)

## 2016-04-24 LAB — GLUCOSE, CAPILLARY
GLUCOSE-CAPILLARY: 172 mg/dL — AB (ref 65–99)
GLUCOSE-CAPILLARY: 177 mg/dL — AB (ref 65–99)
GLUCOSE-CAPILLARY: 187 mg/dL — AB (ref 65–99)
GLUCOSE-CAPILLARY: 192 mg/dL — AB (ref 65–99)
Glucose-Capillary: 164 mg/dL — ABNORMAL HIGH (ref 65–99)
Glucose-Capillary: 154 mg/dL — ABNORMAL HIGH (ref 65–99)

## 2016-04-24 LAB — BASIC METABOLIC PANEL
Anion gap: 7 (ref 5–15)
BUN: 17 mg/dL (ref 6–20)
CO2: 29 mmol/L (ref 22–32)
Calcium: 8.4 mg/dL — ABNORMAL LOW (ref 8.9–10.3)
Chloride: 100 mmol/L — ABNORMAL LOW (ref 101–111)
Creatinine, Ser: 1.04 mg/dL (ref 0.61–1.24)
GLUCOSE: 176 mg/dL — AB (ref 65–99)
Potassium: 3.2 mmol/L — ABNORMAL LOW (ref 3.5–5.1)
Sodium: 136 mmol/L (ref 135–145)

## 2016-04-24 LAB — MAGNESIUM: MAGNESIUM: 2.1 mg/dL (ref 1.7–2.4)

## 2016-04-24 LAB — PHOSPHORUS: Phosphorus: 2.9 mg/dL (ref 2.5–4.6)

## 2016-04-24 MED ORDER — POTASSIUM CHLORIDE 10 MEQ/50ML IV SOLN
10.0000 meq | INTRAVENOUS | Status: AC
  Administered 2016-04-24 (×4): 10 meq via INTRAVENOUS
  Filled 2016-04-24 (×4): qty 50

## 2016-04-24 MED ORDER — FAT EMULSION 20 % IV EMUL
168.0000 mL | INTRAVENOUS | Status: AC
  Administered 2016-04-24: 168 mL via INTRAVENOUS
  Filled 2016-04-24: qty 200

## 2016-04-24 MED ORDER — TRACE MINERALS CR-CU-MN-SE-ZN 10-1000-500-60 MCG/ML IV SOLN
INTRAVENOUS | Status: AC
  Administered 2016-04-24: 17:00:00 via INTRAVENOUS
  Filled 2016-04-24: qty 960

## 2016-04-24 NOTE — Progress Notes (Signed)
CARDIAC REHAB PHASE I   PRE:  Rate/Rhythm: 111 ST PVCs  BP:  Supine:   Sitting: 123/70  Standing:    SaO2: 98%RA  MODE:  Ambulation: 350 ft   POST:  Rate/Rhythm: 129 ST  BP:  Supine:   Sitting: 141/73  Standing:    SaO2: 95%RA 1005-1045 Pt walked 350 ft on RA with gait belt use to stand and asst x 2 to walk due to equipment. Pt used rolling walker and stopped once to take a standing rest. Tolerated well. To bed after walk. NGT intact. Encouraged more walks with staff.   Luetta Nuttingharlene Reon Hunley, RN BSN  04/24/2016 10:41 AM

## 2016-04-24 NOTE — Care Management Note (Signed)
Case Management Note Donn PieriniKristi Ying Rocks RN, BSN Unit 2W-Case Manager [phone]  Patient Details  Name: Elpidio AnisJohn C Frankowski MRN: [id number] Date of Birth: 01/19/1951  Subjective/Objective:  Pt s/p [name] x4  tx from ICU to 2W on 04/16/16                  Action/Plan: PTA pt lived at home- anticipate return home when medically stable  Expected Discharge Date:                  Expected Discharge Plan:  Home/Self Care  In-House Referral:     Discharge planning Services  CM Consult  Post Acute Care Choice:    Choice offered to:     DME Arranged:    DME Agency:     HH Arranged:    HH Agency:     Status of Service:  In process, will continue to follow  If discussed at Long Length of Stay Meetings, dates discussed:    Additional Comments:  04/24/16- 1155- Norman Bier RN, CM- pt with post op ileus- has NGT tube in place- and TNA- also with IV amio - CM will continue to follow for any potential d/c needs  Zenda AlpersWebster, Lenn SinkKristi Hall, RN 04/24/2016, 11:52 AM

## 2016-04-24 NOTE — Progress Notes (Signed)
10 Days Post-Op  Subjective: No complaints. Feeling better. Having bm's  Objective: Vital signs in last 24 hours: Temp:  [98.1 F (36.7 C)-98.3 F (36.8 C)] 98.3 F (36.8 C) (06/30 0433) Pulse Rate:  [97-105] 105 (06/30 0433) Resp:  [18-20] 18 (06/30 0433) BP: (107-122)/(58-62) 122/58 mmHg (06/30 0433) SpO2:  [96 %-98 %] 96 % (06/30 0433) Weight:  [126.327 kg (278 lb 8 oz)] 126.327 kg (278 lb 8 oz) (06/30 0433) Last BM Date: 04/23/16  Intake/Output from previous day: 06/29 0701 - 06/30 0700 In: -  Out: 1415 [Urine:475; Emesis/NG output:940] Intake/Output this shift:    Resp: clear to auscultation bilaterally Cardio: regular rate and rhythm GI: softer, less distended  Lab Results:   Recent Labs  04/23/16 0505 04/24/16 0430  WBC 5.4 7.7  HGB 8.8* 8.3*  HCT 28.1* 26.3*  PLT 262 236   BMET  Recent Labs  04/23/16 0505 04/24/16 0430  NA 137 136  K 3.2* 3.2*  CL 99* 100*  CO2 30 29  GLUCOSE 179* 176*  BUN 25* 17  CREATININE 1.11 1.04  CALCIUM 8.7* 8.4*   PT/INR No results for input(s): LABPROT, INR in the last 72 hours. ABG No results for input(s): PHART, HCO3 in the last 72 hours.  Invalid input(s): PCO2, PO2  Studies/Results: Dg Chest Port 1 View  04/23/2016  CLINICAL DATA:  Shortness of Breath EXAM: PORTABLE CHEST 1 VIEW COMPARISON:  04/17/2016 FINDINGS: Right-sided PICC line and nasogastric catheter are now seen in satisfactory position. Cardiac shadow is stable. Postoperative changes are again seen. The lungs are well aerated bilaterally. No focal infiltrate, effusion or pneumothorax is seen. IMPRESSION: Tubes and lines as described above.  No acute abnormality noted. Electronically Signed   By: Alcide CleverMark  Lukens M.D.   On: 04/23/2016 07:48   Dg Abd Portable 1v  04/23/2016  CLINICAL DATA:  Status post [name], abdominal distention, ileus pattern EXAM: PORTABLE ABDOMEN - 1 VIEW COMPARISON:  Portable abdominal radiograph of April 21, 2016 FINDINGS: There remain  loops of moderately distended gas-filled small bowel in the mid and upper abdomen. There is some gas in the right colon. A small amount of gas is present in the rectum. The nasogastric tube is not visible on this study. IMPRESSION: Persistent ileus. The positioning of the nasogastric tube cannot be assessed on this abdominal radiograph. However, on the accompanying portable chest x-ray of today's date the proximal port of the nasogastric tube can be seen to lie above the expected location of the GE junction. The tip is just inside the gastric cardia. Advancement of the nasogastric tube by 15-20 cm is recommended to assure proper positioning and function of the tube. Electronically Signed   By: David  SwazilandJordan M.D.   On: 04/23/2016 07:48    Anti-infectives: Anti-infectives    Start     Dose/Rate Route Frequency Ordered Stop   04/15/16 2130  vancomycin (VANCOCIN) IVPB 1000 mg/200 mL premix  Status:  Discontinued     1,000 mg 200 mL/hr over 60 Minutes Intravenous  Once 04/14/16 1626 04/15/16 1107   04/15/16 1200  vancomycin (VANCOCIN) IVPB 1000 mg/200 mL premix     1,000 mg 200 mL/hr over 60 Minutes Intravenous  Once 04/15/16 1107 04/15/16 1255   04/15/16 1000  levofloxacin (LEVAQUIN) IVPB 750 mg     750 mg 100 mL/hr over 90 Minutes Intravenous Every 24 hours 04/14/16 1626 04/15/16 1058   04/14/16 0400  vancomycin (VANCOCIN) 1,500 mg in sodium chloride 0.9 % 250 mL  IVPB     1,500 mg 125 mL/hr over 120 Minutes Intravenous To Surgery 04/13/16 1810 04/14/16 1145   04/14/16 0400  vancomycin (VANCOCIN) 1,000 mg in sodium chloride 0.9 % 1,000 mL irrigation      Irrigation To Surgery 04/13/16 1809 04/14/16 1355   04/14/16 0400  levofloxacin (LEVAQUIN) IVPB 500 mg     500 mg 100 mL/hr over 60 Minutes Intravenous To Surgery 04/13/16 1810 04/14/16 1153      Assessment/Plan: s/p Procedure(s): CORONARY ARTERY BYPASS GRAFTING TIMES FOUR    USING LEFT INTERNAL MAMMARY AND ENDOSCOPIC HARVEST RIGHT SAPHENOUS  VEIN (N/A) INTRAOPERATIVE TRANSESOPHAGEAL ECHOCARDIOGRAM (N/A) Ileus vs psbo  Slow improvement. Consider clamping ng today  LOS: 11 days    TOTH III,Emiliana Blaize S 04/24/2016

## 2016-04-24 NOTE — Progress Notes (Addendum)
[address]       [place] [id number]             [phone]      10 Days Post-Op Procedure(s) (LRB): CORONARY ARTERY BYPASS GRAFTING TIMES FOUR    USING LEFT INTERNAL MAMMARY AND ENDOSCOPIC HARVEST RIGHT SAPHENOUS VEIN (N/A) INTRAOPERATIVE TRANSESOPHAGEAL ECHOCARDIOGRAM (N/A) Subjective: Feels better, + loose stools  Objective: Vital signs in last 24 hours: Temp:  [98.1 F (36.7 C)-98.3 F (36.8 C)] 98.3 F (36.8 C) (06/30 0433) Pulse Rate:  [97-105] 105 (06/30 0433) Cardiac Rhythm:  [-] Sinus tachycardia (06/29 1900) Resp:  [18-20] 18 (06/30 0433) BP: (107-122)/(58-62) 122/58 mmHg (06/30 0433) SpO2:  [96 %-98 %] 96 % (06/30 0433) Weight:  [278 lb 8 oz (126.327 kg)] 278 lb 8 oz (126.327 kg) (06/30 0433)  Hemodynamic parameters for last 24 hours:    Intake/Output from previous day: 06/29 0701 - 06/30 0700 In: -  Out: 1415 [Urine:475; Emesis/NG output:940] Intake/Output this shift:    General appearance: alert, cooperative and no distress Heart: regular rate and rhythm and extrasystoles Lungs: mildly dim in lower fields Abdomen: + BS, soft, less distended Extremities: + edema Wound: incis healing, R leg echymosis is stable  Lab Results:  Recent Labs  04/23/16 0505 04/24/16 0430  WBC 5.4 7.7  HGB 8.8* 8.3*  HCT 28.1* 26.3*  PLT 262 236   BMET:  Recent Labs  04/23/16 0505 04/24/16 0430  NA 137 136  K 3.2* 3.2*  CL 99* 100*  CO2 30 29  GLUCOSE 179* 176*  BUN 25* 17  CREATININE 1.11 1.04  CALCIUM 8.7* 8.4*    PT/INR: No results for input(s): LABPROT, INR in the last 72 hours. ABG    Component Value Date/Time   PHART 7.328* 04/14/2016 2006   HCO3 25.0* 04/14/2016 2006   TCO2 26 04/15/2016 1707   ACIDBASEDEF 1.0 04/14/2016 2006   O2SAT 91.0 04/14/2016 2006   CBG (last 3)   Recent Labs  04/23/16 2005 04/24/16 0014 04/24/16 0435  GLUCAP 194* 192* 164*    Meds Scheduled Meds: . atorvastatin  80 mg Oral q1800  .  bisacodyl  10 mg Oral Daily   Or  . bisacodyl  10 mg Rectal Daily  . enoxaparin (LOVENOX) injection  40 mg Subcutaneous Q24H  . gemfibrozil  600 mg Oral BID AC  . insulin aspart  0-24 Units Subcutaneous Q4H  . metoCLOPramide (REGLAN) injection  5 mg Intravenous Q6H  . sodium chloride flush  3 mL Intravenous Q12H   Continuous Infusions: . amiodarone 15 mg/hr (04/24/16 0618)  . dextrose 5 % and 0.45 % NaCl with KCl 20 mEq/L 40 mL/hr at 04/23/16 0800  . Marland Kitchen.[name] (CLINIMIX-E) Adult 40 mL/hr at 04/23/16 1733   And  . fat emulsion 168 mL (04/23/16 1733)   PRN Meds:.acetaminophen, antiseptic oral rinse, gi cocktail, lactulose, LORazepam, metoprolol, ondansetron (ZOFRAN) IV, oxyCODONE, phenol, sodium chloride flush, sodium chloride flush, traMADol  Xrays Dg Chest Port 1 View  04/23/2016  CLINICAL DATA:  Shortness of Breath EXAM: PORTABLE CHEST 1 VIEW COMPARISON:  04/17/2016 FINDINGS: Right-sided PICC line and nasogastric catheter are now seen in satisfactory position. Cardiac shadow is stable. Postoperative changes are again seen. The lungs are well aerated bilaterally. No focal infiltrate, effusion or pneumothorax is seen. IMPRESSION: Tubes and lines as described above.  No acute abnormality noted. Electronically Signed   By: Alcide CleverMark  Lukens M.D.   On: 04/23/2016 07:48  Dg Abd Portable 1v  04/23/2016  CLINICAL DATA:  Status post [name], abdominal distention, ileus pattern EXAM: PORTABLE ABDOMEN - 1 VIEW COMPARISON:  Portable abdominal radiograph of April 21, 2016 FINDINGS: There remain loops of moderately distended gas-filled small bowel in the mid and upper abdomen. There is some gas in the right colon. A small amount of gas is present in the rectum. The nasogastric tube is not visible on this study. IMPRESSION: Persistent ileus. The positioning of the nasogastric tube cannot be assessed on this abdominal radiograph. However, on the accompanying portable chest x-ray of today's date the proximal port of the  nasogastric tube can be seen to lie above the expected location of the GE junction. The tip is just inside the gastric cardia. Advancement of the nasogastric tube by 15-20 cm is recommended to assure proper positioning and function of the tube. Electronically Signed   By: David  [place]azilandJordan M.D.   On: 04/23/2016 07:48    Assessment/Plan: S/P Procedure(s) (LRB): CORONARY ARTERY BYPASS GRAFTING TIMES FOUR    USING LEFT INTERNAL MAMMARY AND ENDOSCOPIC HARVEST RIGHT SAPHENOUS VEIN (N/A) INTRAOPERATIVE TRANSESOPHAGEAL ECHOCARDIOGRAM (N/A)  1 steady improvement with Bowels, cont management per GS 2 sinus tachy, some PVC's 3 creat improved  4 cont TNA 5 mobilize/routine pulm toilet  LOS: 11 days    GOLD,WAYNE E 04/24/2016  Bowel function improving  Sinus now on Cordarone NG per general surgery  I have seen and examined Elpidio AnisJohn C Tabron and agree with the above assessment  and plan.  Delight OvensEdward B Alistair Senft MD Beeper [phone] Office [phone] 04/24/2016 5:55 PM

## 2016-04-24 NOTE — Care Management Important Message (Signed)
Important Message  Patient Details  Name: Jason AnisJohn C [name] MRN: [id number] Date of Birth: 11/26/1950   Medicare Important Message Given:  Yes    [name] 04/24/2016, 11:20 AM

## 2016-04-24 NOTE — Progress Notes (Signed)
Pt has been asked several times to ambulate in the hall today. Pt stated that he does not feel up to it today. Will continue to monitor.   Berdine DanceLauren Moffitt BSN, RN

## 2016-04-24 NOTE — Progress Notes (Signed)
Before the pt walked to the bathroom he passed some loose incontinent stool.  While in the bathroom, the pt had explosive diarrhea.  Pt is currently resting in bed with clean linens with call bell in reach.  Will continue to monitor pt. Harriet Massonavidson, Raha Tennison E, RN

## 2016-04-24 NOTE — Progress Notes (Signed)
PARENTERAL NUTRITION CONSULT NOTE - FOLLOW UP  Pharmacy Consult for [name]  Indication: [name]  Allergies  Allergen Reactions  . Aspirin Anaphylaxis  . Inderal [Propranolol]     Other reaction(s): Other (See Comments) Fatigue  . Lovastatin     Other reaction(s): Muscle Pain  . Metformin Hcl Diarrhea  . Penicillin G Hives  . Pravastatin Sodium     Other reaction(s): Muscle Pain  . Simvastatin     Other reaction(s): Muscle Pain  . Penicillins Rash    Has patient had a PCN reaction causing immediate rash, facial/tongue/throat swelling, SOB or lightheadedness with hypotension: Yes Has patient had a PCN reaction causing severe rash involving mucus membranes or skin necrosis: Yes Has patient had a PCN reaction that required hospitalization No Has patient had a PCN reaction occurring within the last 10 years: Yes If all of the above answers are "NO", then may proceed with Cephalosporin use.     Patient Measurements: Height: '6\' 4"'$  (193 cm) Weight: 278 lb 8 oz (126.327 kg) IBW/kg (Calculated) : 86.8   Vital Signs: Temp: 98.3 F (36.8 C) (06/30 0433) Temp Source: Oral (06/30 0433) BP: 122/58 mmHg (06/30 0433) Pulse Rate: 105 (06/30 0433) Intake/Output from previous day: 06/29 0701 - 06/30 0700 In: -  Out: 1415 [Urine:475; Emesis/NG output:940] Intake/Output from this shift:    Labs:  Recent Labs  04/22/16 0355 04/23/16 0505 04/24/16 0430  WBC 6.2 5.4 7.7  HGB 9.0* 8.8* 8.3*  HCT 28.2* 28.1* 26.3*  PLT 256 262 236     Recent Labs  04/22/16 0355 04/23/16 0505 04/24/16 0430  NA 134* 137 136  K 3.9 3.2* 3.2*  CL 96* 99* 100*  CO2 '30 30 29  '$ GLUCOSE 204* 179* 176*  BUN 35* 25* 17  CREATININE 1.50* 1.11 1.04  CALCIUM 8.7* 8.7* 8.4*  MG  --  1.9 2.1  PHOS  --  2.0* 2.9  PROT 5.8* 5.9*  --   ALBUMIN 2.6* 2.6*  --   AST 34 30  --   ALT 17 17  --   ALKPHOS 50 46  --   BILITOT 1.7* 1.7*  --   PREALBUMIN  --  9.4*  --   TRIG  --  135  --    Estimated  Creatinine Clearance: 101.4 mL/min (by C-G formula based on Cr of 1.04).    Recent Labs  04/24/16 0014 04/24/16 0435 04/24/16 0751  GLUCAP 192* 164* 172*    Medications:  Scheduled:  . atorvastatin  80 mg Oral q1800  . bisacodyl  10 mg Oral Daily   Or  . bisacodyl  10 mg Rectal Daily  . enoxaparin (LOVENOX) injection  40 mg Subcutaneous Q24H  . gemfibrozil  600 mg Oral BID AC  . insulin aspart  0-24 Units Subcutaneous Q4H  . metoCLOPramide (REGLAN) injection  5 mg Intravenous Q6H  . 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 [name] done on 6/20. On 6/23, pt started to become nauseous however still was +BS/+flatus. Despite multiple laxatives - pt was still without a BM and the abdomen was distended. Abd CT on 6/27 showed an ileus vs SBO and surgery was consulted - recommended NGT decompression and  bowel rest. Pharmacy consulted to start [name] for nutritional needs.   GI: [name]. LBM 6/18 - NGT placed 6/26 with 1950 cc output in the first day - down to 940cc in the past 24 hours.  Abd less distended today, (+)loose stools - continuing bowel rest for now. Considering clamping NGT later today.  Alb 2.6.  Pre-albumin 9.4 (6/29).  Pepcid '40mg'$  in [name], Reglan IV q6  Endo: Hx DM. CBGs/12h: 164-194, minimal change despite increased insulin in [name] - given elevated CBGs, will continue [name] at the lower rate today and only titrate up as CBGs are controlled Lytes: Na 136, K 3.2 (goal of >/=4 with ileus), Mg 2.1 (goal of >/=2 with ileus), Phos 2.9 - pt is on D5-1/2NS + 20 mEq KCl at 100 cc/hr - will provide ~20 mEq KCl/day. Will give additional KCl along with Mg/Phos today. Given drop in electrolytes - will keep [name] at a low rate today and will recheck lytes in the AM Renal: SCr  1.04 (baseline ~1), CrCl~50 ml/min, UOP not accurately charted.  Pulm: 97%/RA Cards: Hx HTN/DL and s/p [name] x4 on 6/20. Post-op Afib noted - on amio drip. BP okay. On atora80 & gemfibrozil but holding PO meds currently Hepatobil: LFTs/Alk Phos wnl, TBili 1.7 - will watch for need to adjust TE, TG 135 (6/29) Neuro: Pain score: 6 (abdominal) ID: Afebrile, WBC wnl, on no abx Best Practices: Enox 40 (could consider increasing to 0.5 mg/kg for BMI>30) [name] Access: R-double lumen PICC placed 6/28 [name] start date: 6/28  Current Nutrition:  NPO D5-1/2 NS + 20 mEq KCl at 40 cc/hr provides 163 kcal per day  Nutritional Goals:  Awaiting RD assessment  Plan:  - Continue Clinimix E 5/15 at 40 ml/hr and lipid emulsion at 29m/hr until glucose is more stable - Add MV/TE to [name] bag - Increase insulin R to 45 units in [name] bag - Add Famotidine '40mg'$  to [name] - Clinimix + lipids + IVF will provide 1180 kcal and 48g protein per day - Continue CVTS SSI q4h - KCl 128m IV x 4 - BMet in AM, and [name] labs qMon/Thurs - Will follow-up [name] tolerance and monitor electrolytes closely for refeeding syndrome.  - Advancement in [name] will be dependent on electrolyte stability and CBG control  KeGracy BruinsPharmD Clinical Pharmacist [facility]

## 2016-04-25 LAB — BASIC METABOLIC PANEL
Anion gap: 4 — ABNORMAL LOW (ref 5–15)
BUN: 13 mg/dL (ref 6–20)
CO2: 28 mmol/L (ref 22–32)
Calcium: 8.5 mg/dL — ABNORMAL LOW (ref 8.9–10.3)
Chloride: 104 mmol/L (ref 101–111)
Creatinine, Ser: 1.11 mg/dL (ref 0.61–1.24)
GFR calc Af Amer: >60 mL/min (ref >60)
GLUCOSE: 141 mg/dL — AB (ref 65–99)
POTASSIUM: 3.4 mmol/L — AB (ref 3.5–5.1)
Sodium: 136 mmol/L (ref 135–145)

## 2016-04-25 LAB — GLUCOSE, CAPILLARY
GLUCOSE-CAPILLARY: 149 mg/dL — AB (ref 65–99)
GLUCOSE-CAPILLARY: 177 mg/dL — AB (ref 65–99)
GLUCOSE-CAPILLARY: 146 mg/dL — AB (ref 65–99)
GLUCOSE-CAPILLARY: 155 mg/dL — AB (ref 65–99)
Glucose-Capillary: 146 mg/dL — ABNORMAL HIGH (ref 65–99)
Glucose-Capillary: 156 mg/dL — ABNORMAL HIGH (ref 65–99)

## 2016-04-25 MED ORDER — TRACE MINERALS CR-CU-MN-SE-ZN 10-1000-500-60 MCG/ML IV SOLN
INTRAVENOUS | Status: AC
  Administered 2016-04-25: 17:00:00 via INTRAVENOUS
  Filled 2016-04-25: qty 1680

## 2016-04-25 MED ORDER — POTASSIUM CHLORIDE 10 MEQ/50ML IV SOLN: 10.0000 meq | INTRAVENOUS | Status: DC

## 2016-04-25 MED ORDER — DEXTROSE 5 % IV SOLN
500.0000 mg | INTRAVENOUS | Status: DC
  Administered 2016-04-25 – 2016-04-26 (×2): 500 mg via INTRAVENOUS
  Filled 2016-04-25 (×3): qty 500

## 2016-04-25 MED ORDER — FAT EMULSION 20 % IV EMUL
240.0000 mL | INTRAVENOUS | Status: AC
  Administered 2016-04-25: 240 mL via INTRAVENOUS
  Filled 2016-04-25: qty 250

## 2016-04-25 MED ORDER — KCL IN DEXTROSE-NACL 20-5-0.45 MEQ/L-%-% IV SOLN
INTRAVENOUS | Status: AC
  Administered 2016-04-25 – 2016-04-28 (×2): via INTRAVENOUS
  Filled 2016-04-25: qty 1000

## 2016-04-25 MED ORDER — POTASSIUM CHLORIDE 10 MEQ/100ML IV SOLN
10.0000 meq | INTRAVENOUS | Status: AC
  Administered 2016-04-25: 10 meq via INTRAVENOUS
  Filled 2016-04-25: qty 100

## 2016-04-25 MED ORDER — POTASSIUM CHLORIDE 10 MEQ/100ML IV SOLN
10.0000 meq | INTRAVENOUS | Status: AC
  Administered 2016-04-25 (×3): 10 meq via INTRAVENOUS
  Filled 2016-04-25 (×3): qty 100

## 2016-04-25 NOTE — Progress Notes (Signed)
NG was removed. Patient tolerated it well and is resting in his chair. Will continue to monitor.

## 2016-04-25 NOTE — Progress Notes (Signed)
PARENTERAL NUTRITION CONSULT NOTE - FOLLOW UP  Pharmacy Consult for [name]  Indication: [name]  Allergies  Allergen Reactions  . Aspirin Anaphylaxis  . Inderal [Propranolol]     Other reaction(s): Other (See Comments) Fatigue  . Lovastatin     Other reaction(s): Muscle Pain  . Metformin Hcl Diarrhea  . Penicillin G Hives  . Pravastatin Sodium     Other reaction(s): Muscle Pain  . Simvastatin     Other reaction(s): Muscle Pain  . Penicillins Rash    Has patient had a PCN reaction causing immediate rash, facial/tongue/throat swelling, SOB or lightheadedness with hypotension: Yes Has patient had a PCN reaction causing severe rash involving mucus membranes or skin necrosis: Yes Has patient had a PCN reaction that required hospitalization No Has patient had a PCN reaction occurring within the last 10 years: Yes If all of the above answers are "NO", then may proceed with Cephalosporin use.     Patient Measurements: Height: '6\' 4"'$  (193 cm) Weight: 278 lb 9.6 oz (126.372 kg) IBW/kg (Calculated) : 86.8   Vital Signs: Temp: 98.6 F (37 C) (07/01 0400) Temp Source: Oral (07/01 0400) BP: 124/61 mmHg (07/01 0400) Pulse Rate: 103 (07/01 0400) Intake/Output from previous day: 06/30 0701 - 07/01 0700 In: 0  Out: 550 [Urine:250; Emesis/NG output:300] Intake/Output from this shift:    Labs:  Recent Labs  04/23/16 0505 04/24/16 0430  WBC 5.4 7.7  HGB 8.8* 8.3*  HCT 28.1* 26.3*  PLT 262 236     Recent Labs  04/23/16 0505 04/24/16 0430 04/25/16 0443  NA 137 136 136  K 3.2* 3.2* 3.4*  CL 99* 100* 104  CO2 '30 29 28  '$ GLUCOSE 179* 176* 141*  BUN 25* 17 13  CREATININE 1.11 1.04 1.11  CALCIUM 8.7* 8.4* 8.5*  MG 1.9 2.1  --   PHOS 2.0* 2.9  --   PROT 5.9*  --   --   ALBUMIN 2.6*  --   --   AST 30  --   --   ALT 17  --   --   ALKPHOS 46  --   --   BILITOT 1.7*  --   --   PREALBUMIN 9.4*  --   --   TRIG 135  --   --    Estimated Creatinine Clearance: 95 mL/min  (by C-G formula based on Cr of 1.11).    Recent Labs  04/24/16 2045 04/25/16 0108 04/25/16 0412  GLUCAP 154* 146* 149*    Medications:  Scheduled:  . atorvastatin  80 mg Oral q1800  . bisacodyl  10 mg Oral Daily   Or  . bisacodyl  10 mg Rectal Daily  . enoxaparin (LOVENOX) injection  40 mg Subcutaneous Q24H  . gemfibrozil  600 mg Oral BID AC  . insulin aspart  0-24 Units Subcutaneous Q4H  . metoCLOPramide (REGLAN) injection  5 mg Intravenous Q6H  . 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 [name] done on 6/20. On 6/23, pt started to become nauseous however still was +BS/+flatus. Despite multiple laxatives - pt was still without a BM and the abdomen was distended. Abd CT on 6/27 showed an ileus vs SBO and surgery was  consulted - recommended NGT decompression and bowel rest. Pharmacy consulted to start [name] for nutritional needs.   GI: [name]. LBM 6/18 - NGT placed 6/26 with 1950 cc output in the first day - down to 440cc in the past 24 hours.  Abd less distended today, (+)loose stools - continuing bowel rest for now. Removing NGT on 7/1 and starting clears - will f/u tolerance and advancement of diet.  Alb 2.6.  Pre-albumin 9.4 (6/29).  Pepcid '40mg'$  in [name], Reglan IV q6  Endo: Hx DM. CBGs/12h: 141-154 improved with increased insulin R in [name]. Will be able to increase [name] rate today d/t better CBG control - will increase insulin R in [name] bag in relation to rate increase.  Lytes: Na 136, K 3.4 (goal of >/=4 with ileus), Mg 2.1 from 6/30 (goal of >/=2 with ileus), Phos 2.9(6/30) - pt is on D5-1/2NS + 20 mEq KCl at 40 cc/hr - will provide ~20 mEq KCl/day. Will give additional KCl today. Renal: SCr 1.11 << 1.04 (baseline ~1), CrCl~50 ml/min, UOP not accurately charted.   Pulm: 96%/RA Cards: Hx HTN/DL and s/p [name] x4 on 6/20. Post-op Afib noted - on amio drip. BP okay. On atora80 & gemfibrozil but holding PO meds currently Hepatobil: LFTs/Alk Phos wnl, TBili 1.7 - will watch for need to adjust TE, TG 135 (6/29) Neuro: Pain score: 0-3 (abdominal) ID: Afebrile, WBC wnl, on no abx Best Practices: Enox 40 (could consider increasing to 0.5 mg/kg for BMI>30) [name] Access: R-double lumen PICC placed 6/28 [name] start date: 6/28  Current Nutrition:  NPO D5-1/2 NS + 20 mEq KCl at 40 cc/hr provides 163 kcal per day Clinimix E 5/15 at 40 ml/hr 20% lipid emulsion at 7 ml/hr provides 1018 kcal and 48g protein per day  Nutritional Goals: (Per RD on 6/29) 2200-2400 kcal, 110-120g protein, and 2.2-2.4 L fluid/day Goal rate of Clinimix E 5/15 @ 100 ml/hr + 20% lipid emulsion at 10 ml/hr will provide 2184 kcal and 120g protein per day (100% of kcal/protein neds)  Plan:  - Increase Clinimix E 5/15 to 70 ml/hr and increase 20% lipid emulsion to 10 ml/hr - Add MV/TE to [name] bag - Increase insulin R to 70 units in [name] bag (given the rate increase) - Add Famotidine '40mg'$  to [name] - Clinimix + lipids + IVF will provide 1835 kcal and 84g protein per day (will provide 83% of kcal and 76% protein requirements) - Reduce IVF to [facility] this evening when [name] advanced - Continue CVTS SSI q4h - KCl 48mq IV x 4 - BMET in AM, and [name] labs qMon/Thurs - Will follow-up diet tolerance and advancement for plans to wean and discontinue [name]  EAlycia [name] PharmD, BCPS Clinical Pharmacist Pager: [phone]/10/2015 7:41 AM

## 2016-04-25 NOTE — Progress Notes (Signed)
CARDIAC REHAB PHASE I   PRE:  Rate/Rhythm: 103 ST    BP: sitting 120/62    SaO2: 95 RA  MODE:  Ambulation: 350 ft   POST:  Rate/Rhythm: 119 ST    BP: sitting 113/75     SaO2: 96 RA  Pt c/o lack of sleep today. Also back and legs hurting more than belly or chest. Mobility mechanics are improving although still needs reminders for sternal precautions. Slow, steady pace walking. Rested against wall at halfway due to back pain. To recliner, exhausted. Noted upper bedsore, notified RN. Encouraged x2 more walks today.  [phone]  Jason MassonRandi Kristan Merilyn [name] CES, ACSM 04/25/2016 9:54 AM

## 2016-04-25 NOTE — Progress Notes (Signed)
11 Days Post-Op  Subjective: Pt having BM    Objective: Vital signs in last 24 hours: Temp:  [97.5 F (36.4 C)-98.6 F (37 C)] 98.6 F (37 C) (07/01 0400) Pulse Rate:  [101-103] 103 (07/01 0400) Resp:  [18] 18 (07/01 0400) BP: (110-124)/(61-62) 124/61 mmHg (07/01 0400) SpO2:  [96 %-100 %] 96 % (07/01 0400) Weight:  [126.372 kg (278 lb 9.6 oz)] 126.372 kg (278 lb 9.6 oz) (07/01 0400) Last BM Date: 04/24/16  Intake/Output from previous day: 06/30 0701 - 07/01 0700 In: 0  Out: 850 [Urine:250; Emesis/NG output:600] Intake/Output this shift:    GI: soft distended but NT  BS present   Lab Results:   Recent Labs  04/23/16 0505 04/24/16 0430  WBC 5.4 7.7  HGB 8.8* 8.3*  HCT 28.1* 26.3*  PLT 262 236   BMET  Recent Labs  04/24/16 0430 04/25/16 0443  NA 136 136  K 3.2* 3.4*  CL 100* 104  CO2 29 28  GLUCOSE 176* 141*  BUN 17 13  CREATININE 1.04 1.11  CALCIUM 8.4* 8.5*   PT/INR No results for input(s): LABPROT, INR in the last 72 hours. ABG No results for input(s): PHART, HCO3 in the last 72 hours.  Invalid input(s): PCO2, PO2  Studies/Results: No results found.  Anti-infectives: Anti-infectives    Start     Dose/Rate Route Frequency Ordered Stop   04/25/16 0900  azithromycin (ZITHROMAX) 500 mg in dextrose 5 % 250 mL IVPB     500 mg 250 mL/hr over 60 Minutes Intravenous Every 24 hours 04/25/16 0803     04/15/16 2130  vancomycin (VANCOCIN) IVPB 1000 mg/200 mL premix  Status:  Discontinued     1,000 mg 200 mL/hr over 60 Minutes Intravenous  Once 04/14/16 1626 04/15/16 1107   04/15/16 1200  vancomycin (VANCOCIN) IVPB 1000 mg/200 mL premix     1,000 mg 200 mL/hr over 60 Minutes Intravenous  Once 04/15/16 1107 04/15/16 1255   04/15/16 1000  levofloxacin (LEVAQUIN) IVPB 750 mg     750 mg 100 mL/hr over 90 Minutes Intravenous Every 24 hours 04/14/16 1626 04/15/16 1058   04/14/16 0400  vancomycin (VANCOCIN) 1,500 mg in sodium chloride 0.9 % 250 mL IVPB     1,500 mg 125 mL/hr over 120 Minutes Intravenous To Surgery 04/13/16 1810 04/14/16 1145   04/14/16 0400  vancomycin (VANCOCIN) 1,000 mg in sodium chloride 0.9 % 1,000 mL irrigation      Irrigation To Surgery 04/13/16 1809 04/14/16 1355   04/14/16 0400  levofloxacin (LEVAQUIN) IVPB 500 mg     500 mg 100 mL/hr over 60 Minutes Intravenous To Surgery 04/13/16 1810 04/14/16 1153      Assessment/Plan: Ileus  Improving  Moving bowels   Remove NGT and start clears     Patient Active Problem List   Diagnosis Date Noted  . 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 Unstable angina (HCC) 04/13/2016  . 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 Type II diabetes mellitus (HCC)   . Essential hypertension   . Mixed hyperlipidemia   . Obesity      LOS: 12 days    Jason [name] A. 04/25/2016

## 2016-04-25 NOTE — Progress Notes (Signed)
Patient declined ambulating. He stated that he wanted to sleep and get some rest. He did spend most of the day in the chair.

## 2016-04-25 NOTE — Progress Notes (Addendum)
[address]       [place] [id number]             [phone]      11 Days Post-Op Procedure(s) (LRB): CORONARY ARTERY BYPASS GRAFTING TIMES FOUR    USING LEFT INTERNAL MAMMARY AND ENDOSCOPIC HARVEST RIGHT SAPHENOUS VEIN (N/A) INTRAOPERATIVE TRANSESOPHAGEAL ECHOCARDIOGRAM (N/A) Subjective: conts to slowly feel better, conts with loose stools   Objective: Vital signs in last 24 hours: Temp:  [97.5 F (36.4 C)-98.6 F (37 C)] 98.6 F (37 C) (07/01 0400) Pulse Rate:  [101-103] 103 (07/01 0400) Cardiac Rhythm:  [-] Sinus tachycardia (07/01 0700) Resp:  [18] 18 (07/01 0400) BP: (110-124)/(61-62) 124/61 mmHg (07/01 0400) SpO2:  [96 %-100 %] 96 % (07/01 0400) Weight:  [278 lb 9.6 oz (126.372 kg)] 278 lb 9.6 oz (126.372 kg) (07/01 0400)  Hemodynamic parameters for last 24 hours:    Intake/Output from previous day: 06/30 0701 - 07/01 0700 In: 0  Out: 850 [Urine:250; Emesis/NG output:600] Intake/Output this shift:    General appearance: alert, cooperative, fatigued and no distress Heart: regular rate and rhythm Lungs: sl dim in lower fields Abdomen: + BS, less distended, soft, min ttp Extremities: + LE edema Wound: some increased redness RLE , warm  Lab Results:  Recent Labs  04/23/16 0505 04/24/16 0430  WBC 5.4 7.7  HGB 8.8* 8.3*  HCT 28.1* 26.3*  PLT 262 236   BMET:  Recent Labs  04/24/16 0430 04/25/16 0443  NA 136 136  K 3.2* 3.4*  CL 100* 104  CO2 29 28  GLUCOSE 176* 141*  BUN 17 13  CREATININE 1.04 1.11  CALCIUM 8.4* 8.5*    PT/INR: No results for input(s): LABPROT, INR in the last 72 hours. ABG    Component Value Date/Time   PHART 7.328* 04/14/2016 2006   HCO3 25.0* 04/14/2016 2006   TCO2 26 04/15/2016 1707   ACIDBASEDEF 1.0 04/14/2016 2006   O2SAT 91.0 04/14/2016 2006   CBG (last 3)   Recent Labs  04/24/16 2045 04/25/16 0108 04/25/16 0412  GLUCAP 154* 146* 149*    Meds Scheduled Meds: . atorvastatin  80 mg  Oral q1800  . bisacodyl  10 mg Oral Daily   Or  . bisacodyl  10 mg Rectal Daily  . enoxaparin (LOVENOX) injection  40 mg Subcutaneous Q24H  . gemfibrozil  600 mg Oral BID AC  . insulin aspart  0-24 Units Subcutaneous Q4H  . metoCLOPramide (REGLAN) injection  5 mg Intravenous Q6H  . sodium chloride flush  3 mL Intravenous Q12H   Continuous Infusions: . amiodarone 15 mg/hr (04/25/16 0648)  . dextrose 5 % and 0.45 % NaCl with KCl 20 mEq/L 40 mL/hr at 04/24/16 0814  . Marland Kitchen.[name] (CLINIMIX-E) Adult 40 mL/hr at 04/24/16 1729   And  . fat emulsion 168 mL (04/24/16 1729)   PRN Meds:.acetaminophen, antiseptic oral rinse, gi cocktail, lactulose, LORazepam, metoprolol, ondansetron (ZOFRAN) IV, oxyCODONE, phenol, sodium chloride flush, sodium chloride flush, traMADol  Xrays No results found.  Assessment/Plan: S/P Procedure(s) (LRB): CORONARY ARTERY BYPASS GRAFTING TIMES FOUR    USING LEFT INTERNAL MAMMARY AND ENDOSCOPIC HARVEST RIGHT SAPHENOUS VEIN (N/A) INTRAOPERATIVE TRANSESOPHAGEAL ECHOCARDIOGRAM (N/A)  1 slow resolution of ileus- GS managing,  2 cellulitis RLE- start zithromax with pcn allergy 3 adeq CBG control 4 creat remains normal    LOS: 12 days    GOLD,WAYNE E 04/25/2016  Patient seen and examined, agree with above NG out and  [facility]  Viviann SpareSteven C. Dorris FetchHendrickson, MD [facility] [phone]

## 2016-04-26 LAB — BASIC METABOLIC PANEL
Anion gap: 6 (ref 5–15)
BUN: 11 mg/dL (ref 6–20)
CALCIUM: 8.4 mg/dL — AB (ref 8.9–10.3)
CO2: 27 mmol/L (ref 22–32)
Chloride: 102 mmol/L (ref 101–111)
Creatinine, Ser: 1.01 mg/dL (ref 0.61–1.24)
GFR calc Af Amer: >60 mL/min (ref >60)
GLUCOSE: 167 mg/dL — AB (ref 65–99)
POTASSIUM: 3.6 mmol/L (ref 3.5–5.1)
Sodium: 135 mmol/L (ref 135–145)

## 2016-04-26 LAB — GLUCOSE, CAPILLARY
GLUCOSE-CAPILLARY: 188 mg/dL — AB (ref 65–99)
GLUCOSE-CAPILLARY: 168 mg/dL — AB (ref 65–99)
GLUCOSE-CAPILLARY: 240 mg/dL — AB (ref 65–99)
GLUCOSE-CAPILLARY: 205 mg/dL — AB (ref 65–99)
GLUCOSE-CAPILLARY: 161 mg/dL — AB (ref 65–99)

## 2016-04-26 MED ORDER — POTASSIUM CHLORIDE 10 MEQ/50ML IV SOLN: 10.0000 meq | INTRAVENOUS | Status: DC

## 2016-04-26 MED ORDER — POTASSIUM CHLORIDE 10 MEQ/100ML IV SOLN
10.0000 meq | INTRAVENOUS | Status: AC
  Administered 2016-04-26 (×2): 10 meq via INTRAVENOUS
  Filled 2016-04-26: qty 100

## 2016-04-26 MED ORDER — POTASSIUM CHLORIDE 10 MEQ/100ML IV SOLN
10.0000 meq | INTRAVENOUS | Status: AC
  Filled 2016-04-26: qty 100

## 2016-04-26 MED ORDER — INSULIN ASPART 100 UNIT/ML ~~LOC~~ SOLN
0.0000 [IU] | Freq: Three times a day (TID) | SUBCUTANEOUS | Status: DC
Start: 2016-04-26 — End: 2016-05-06
  Administered 2016-04-26: 4 [IU] via SUBCUTANEOUS
  Administered 2016-04-26: 8 [IU] via SUBCUTANEOUS
  Administered 2016-04-27: 12 [IU] via SUBCUTANEOUS
  Administered 2016-04-27: 8 [IU] via SUBCUTANEOUS
  Administered 2016-04-27: 4 [IU] via SUBCUTANEOUS
  Administered 2016-04-27 – 2016-04-28 (×3): 8 [IU] via SUBCUTANEOUS
  Administered 2016-04-28 (×2): 4 [IU] via SUBCUTANEOUS
  Administered 2016-04-29 (×2): 2 [IU] via SUBCUTANEOUS
  Administered 2016-04-29 (×2): 8 [IU] via SUBCUTANEOUS
  Administered 2016-04-30: 2 [IU] via SUBCUTANEOUS
  Administered 2016-05-01: 8 [IU] via SUBCUTANEOUS
  Administered 2016-05-01: 2 [IU] via SUBCUTANEOUS
  Administered 2016-05-01: 4 [IU] via SUBCUTANEOUS
  Administered 2016-05-02 (×2): 8 [IU] via SUBCUTANEOUS
  Administered 2016-05-02: 4 [IU] via SUBCUTANEOUS
  Administered 2016-05-03 (×2): 8 [IU] via SUBCUTANEOUS
  Administered 2016-05-03: 4 [IU] via SUBCUTANEOUS
  Administered 2016-05-04: 2 [IU] via SUBCUTANEOUS
  Administered 2016-05-04 (×2): 4 [IU] via SUBCUTANEOUS
  Administered 2016-05-05: 2 [IU] via SUBCUTANEOUS
  Administered 2016-05-05: 8 [IU] via SUBCUTANEOUS

## 2016-04-26 NOTE — Progress Notes (Addendum)
[address]       [place] [id number]             [phone]      12 Days Post-Op Procedure(s) (LRB): CORONARY ARTERY BYPASS GRAFTING TIMES FOUR    USING LEFT INTERNAL MAMMARY AND ENDOSCOPIC HARVEST RIGHT SAPHENOUS VEIN (N/A) INTRAOPERATIVE TRANSESOPHAGEAL ECHOCARDIOGRAM (N/A) Subjective: Feeling better, tolerating small amts of clears, conts loose stooling  Objective: Vital signs in last 24 hours: Temp:  [98.1 F (36.7 C)-98.9 F (37.2 C)] 98.4 F (36.9 C) (07/02 0357) Pulse Rate:  [72-108] 108 (07/02 0357) Cardiac Rhythm:  [-] Sinus tachycardia;Bundle branch block (07/02 0700) Resp:  [18-20] 20 (07/02 0357) BP: (109-125)/(57-64) 125/58 mmHg (07/02 0357) SpO2:  [94 %-99 %] 94 % (07/02 0357) Weight:  [283 lb 3.2 oz (128.459 kg)] 283 lb 3.2 oz (128.459 kg) (07/02 0357)  Hemodynamic parameters for last 24 hours:    Intake/Output from previous day: 07/01 0701 - 07/02 0700 In: -  Out: 825 [Urine:825] Intake/Output this shift:    General appearance: alert, cooperative and no distress Heart: regular rate and rhythm Lungs: clear to auscultation bilaterally Abdomen: soft, mild dist, nontender Extremities: + edema improved Wound: R thigh echymosis stable, erethema lower leg appears about the same(cellulitis)  Lab Results:  Recent Labs  04/24/16 0430  WBC 7.7  HGB 8.3*  HCT 26.3*  PLT 236   BMET:  Recent Labs  04/25/16 0443 04/26/16 0455  NA 136 135  K 3.4* 3.6  CL 104 102  CO2 28 27  GLUCOSE 141* 167*  BUN 13 11  CREATININE 1.11 1.01  CALCIUM 8.5* 8.4*    PT/INR: No results for input(s): LABPROT, INR in the last 72 hours. ABG    Component Value Date/Time   PHART 7.328* 04/14/2016 2006   HCO3 25.0* 04/14/2016 2006   TCO2 26 04/15/2016 1707   ACIDBASEDEF 1.0 04/14/2016 2006   O2SAT 91.0 04/14/2016 2006   CBG (last 3)   Recent Labs  04/25/16 1626 04/25/16 2007 04/26/16 0355  GLUCAP 146* 155* 188*    Meds Scheduled  Meds: . atorvastatin  80 mg Oral q1800  . azithromycin  500 mg Intravenous Q24H  . bisacodyl  10 mg Oral Daily   Or  . bisacodyl  10 mg Rectal Daily  . enoxaparin (LOVENOX) injection  40 mg Subcutaneous Q24H  . gemfibrozil  600 mg Oral BID AC  . insulin aspart  0-24 Units Subcutaneous Q4H  . metoCLOPramide (REGLAN) injection  5 mg Intravenous Q6H  . potassium chloride  10 mEq Intravenous Q1 Hr x 4  . sodium chloride flush  3 mL Intravenous Q12H   Continuous Infusions: . amiodarone 15 mg/hr (04/26/16 0600)  . dextrose 5 % and 0.45 % NaCl with KCl 20 mEq/L 10 mL/hr at 04/25/16 1843  . Marland Kitchen.[name] (CLINIMIX-E) Adult 70 mL/hr at 04/25/16 1727   And  . fat emulsion 240 mL (04/25/16 1727)   PRN Meds:.acetaminophen, antiseptic oral rinse, gi cocktail, lactulose, LORazepam, metoprolol, ondansetron (ZOFRAN) IV, oxyCODONE, phenol, sodium chloride flush, sodium chloride flush, traMADol  Xrays No results found.  Assessment/Plan: S/P Procedure(s) (LRB): CORONARY ARTERY BYPASS GRAFTING TIMES FOUR    USING LEFT INTERNAL MAMMARY AND ENDOSCOPIC HARVEST RIGHT SAPHENOUS VEIN (N/A) INTRAOPERATIVE TRANSESOPHAGEAL ECHOCARDIOGRAM (N/A)  1 stable with slow improvement of ileus- tolerating NGT removal, eating small amts of clear liquids without nausea- cont GS management 2 cont current abx for cellulitis 3 labs stable 4 sugars adeq  controlled 5 hemodyn stable in sinus, QTc 480    LOS: 13 days    GOLD,WAYNE E 04/26/2016  Patient seen and examined, agree with above Need to keep a close eye on right leg cellulitis Advance diet per Dr. Arna Snipehompson  Ashyr Hedgepath C. Dorris FetchHendrickson, MD [facility] [phone]

## 2016-04-26 NOTE — Progress Notes (Signed)
PARENTERAL NUTRITION CONSULT NOTE - FOLLOW UP  Pharmacy Consult for [name]  Indication: [name]  Allergies  Allergen Reactions  . Aspirin Anaphylaxis  . Inderal [Propranolol]     Other reaction(s): Other (See Comments) Fatigue  . Lovastatin     Other reaction(s): Muscle Pain  . Metformin Hcl Diarrhea  . Penicillin G Hives  . Pravastatin Sodium     Other reaction(s): Muscle Pain  . Simvastatin     Other reaction(s): Muscle Pain  . Penicillins Rash    Has patient had a PCN reaction causing immediate rash, facial/tongue/throat swelling, SOB or lightheadedness with hypotension: Yes Has patient had a PCN reaction causing severe rash involving mucus membranes or skin necrosis: Yes Has patient had a PCN reaction that required hospitalization No Has patient had a PCN reaction occurring within the last 10 years: Yes If all of the above answers are "NO", then may proceed with Cephalosporin use.     Patient Measurements: Height: 6' 4" (193 cm) Weight: 283 lb 3.2 oz (128.459 kg) IBW/kg (Calculated) : 86.8   Vital Signs: Temp: 98.4 F (36.9 C) (07/02 0357) Temp Source: Oral (07/02 0357) BP: 125/58 mmHg (07/02 0357) Pulse Rate: 108 (07/02 0357) Intake/Output from previous day: 07/01 0701 - 07/02 0700 In: -  Out: 825 [Urine:825] Intake/Output from this shift:    Labs:  Recent Labs  04/24/16 0430  WBC 7.7  HGB 8.3*  HCT 26.3*  PLT 236     Recent Labs  04/24/16 0430 04/25/16 0443 04/26/16 0455  NA 136 136 135  K 3.2* 3.4* 3.6  CL 100* 104 102  CO2 _0 GLUCOSE 176* 141* 167*  BUN _1 CREATININE 1.04 1.11 1.01  CALCIUM 8.4* 8.5* 8.4*  MG 2.1  --   --   PHOS 2.9  --   --    Estimated Creatinine Clearance: 105.3 mL/min (by C-G formula based on Cr of 1.01).    Recent Labs  04/25/16 1626 04/25/16 2007 04/26/16 0355  GLUCAP 146* 155* 188*    Medications:  Scheduled:  . atorvastatin  80 mg Oral q1800  . azithromycin  500 mg Intravenous  Q24H  . bisacodyl  10 mg Oral Daily   Or  . bisacodyl  10 mg Rectal Daily  . enoxaparin (LOVENOX) injection  40 mg Subcutaneous Q24H  . gemfibrozil  600 mg Oral BID AC  . insulin aspart  0-24 Units Subcutaneous Q4H  . metoCLOPramide (REGLAN) injection  5 mg Intravenous Q6H  . sodium chloride flush  3 mL Intravenous Q12H    Insulin Requirements in the past 12 hours:  70 units insulin R in [name] (inc'd 6/30) 6 units SSI in the past 12 hours since [name] hung  Assessment: [name] transferred to [facility] on 6/19 for [name] in setting of 3vCAD. [name] done on 6/20. On 6/23, pt started to become nauseous however still was +BS/+flatus. Despite multiple laxatives - pt was still without a BM and the abdomen was distended. Abd CT on 6/27 showed an ileus vs SBO and surgery was consulted - recommended NGT decompression and bowel rest. Pharmacy consulted to start [name] for nutritional needs.   GI: [name]. LBM 6/18 - NGT placed 6/26 with 1950 cc output in the first day - down to 440cc in the past 24 hours.  Abd less distended today, (+)loose stools - continuing bowel rest for now.NGT removed 7/1 and the patient was started on clears -  tolerating and advancing diet this AM. + BM so ileus resolved. Per surgery to wean [name] and okay with d/cing after today's bag. Also confirmed with CVTS - okay to d/c today. Will order  Alb 2.6.  Pre-albumin 9.4 (6/29).  Pepcid 40mg in [name], Reglan IV q6  Endo: Hx DM. CBGs/12h: 155-188.   Lytes: Na 136, K 3.6 (goal of >/=4 with ileus/Afib), Mg 2.1 from 6/30 (goal of >/=2 with ileus), Phos 2.9 (6/30) - pt is on D5-1/2NS + 20 mEq KCl at 10 cc/hr - will provide ~5 mEq KCl/day. Will give additional KCl today (via peripheral line since all ports of central line are being utilized) Renal: SCr 1.01 << 1.11 (baseline ~1), CrCl~50 ml/min, UOP not accurately charted.  Pulm: 94%/RA Cards: Hx HTN/DL and s/p [name] x4 on 6/20. Post-op Afib noted - on amio drip. BP okay. On atora80 & gemfibrozil but  holding PO meds currently Hepatobil: LFTs/Alk Phos wnl, TBili 1.7 - will watch for need to adjust TE, TG 135 (6/29) Neuro: Pain score: 0-3 (abdominal) ID: Afebrile, WBC wnl, on no abx Best Practices: Enox 40 (could consider increasing to 0.5 mg/kg for BMI>30) [name] Access: R-double lumen PICC placed 6/28 [name] start date: 6/28  Current Nutrition:  NPO D5-1/2 NS + 20 mEq KCl at 10 cc/hr provides 41 kcal per day Clinimix E 5/15 at 70 ml/hr 20% lipid emulsion at 10 ml/hr provides 1672 kcal and 84g protein per day  Nutritional Goals: (Per RD on 6/29) 2200-2400 kcal, 110-120g protein, and 2.2-2.4 L fluid/day Goal rate of Clinimix E 5/15 @ 100 ml/hr + 20% lipid emulsion at 10 ml/hr will provide 2184 kcal and 120g protein per day (100% of kcal/protein neds)  Plan:  - Reduce Clinimix E 5/15 to 35 ml/hr (1/2 rate) starting at 1600 today - then discontinue at 1800 (discussed with RN - Sandra). Lipids can continue at 10 ml/hr until 1800 today. - KCl 10 mEq IV x 2 runs today - Diet being advanced today - Will adjust CVTS insulin back to tidwc and hs once [name] stopped today. - Will d/c [name] labs - Pharmacy will sign off - please re-consult us if [name] needs to be re-initiated  Elizabeth Martin, PharmD, BCPS Clinical Pharmacist Pager: [phone] 04/26/2016 7:21 AM      

## 2016-04-26 NOTE — Progress Notes (Signed)
12 Days Post-Op  Subjective: Mult BMs and tolerating clears well  Objective: Vital signs in last 24 hours: Temp:  [98.1 F (36.7 C)-98.9 F (37.2 C)] 98.4 F (36.9 C) (07/02 0357) Pulse Rate:  [72-108] 108 (07/02 0357) Resp:  [18-20] 20 (07/02 0357) BP: (109-125)/(57-64) 125/58 mmHg (07/02 0357) SpO2:  [94 %-99 %] 94 % (07/02 0357) Weight:  [128.459 kg (283 lb 3.2 oz)] 128.459 kg (283 lb 3.2 oz) (07/02 0357) Last BM Date: 04/25/16  Intake/Output from previous day: 07/01 0701 - 07/02 0700 In: -  Out: 825 [Urine:825] Intake/Output this shift: Total I/O In: -  Out: 200 [Urine:200]  General appearance: alert and cooperative Chest wall: sternal incision GI: soft, NT, less dist  Lab Results:   Recent Labs  04/24/16 0430  WBC 7.7  HGB 8.3*  HCT 26.3*  PLT 236   BMET  Recent Labs  04/25/16 0443 04/26/16 0455  NA 136 135  K 3.4* 3.6  CL 104 102  CO2 28 27  GLUCOSE 141* 167*  BUN 13 11  CREATININE 1.11 1.01  CALCIUM 8.5* 8.4*   PT/INR No results for input(s): LABPROT, INR in the last 72 hours. ABG No results for input(s): PHART, HCO3 in the last 72 hours.  Invalid input(s): PCO2, PO2  Studies/Results: No results found.  Anti-infectives: Anti-infectives    Start     Dose/Rate Route Frequency Ordered Stop   04/25/16 0900  azithromycin (ZITHROMAX) 500 mg in dextrose 5 % 250 mL IVPB     500 mg 250 mL/hr over 60 Minutes Intravenous Every 24 hours 04/25/16 0803     04/15/16 2130  vancomycin (VANCOCIN) IVPB 1000 mg/200 mL premix  Status:  Discontinued     1,000 mg 200 mL/hr over 60 Minutes Intravenous  Once 04/14/16 1626 04/15/16 1107   04/15/16 1200  vancomycin (VANCOCIN) IVPB 1000 mg/200 mL premix     1,000 mg 200 mL/hr over 60 Minutes Intravenous  Once 04/15/16 1107 04/15/16 1255   04/15/16 1000  levofloxacin (LEVAQUIN) IVPB 750 mg     750 mg 100 mL/hr over 90 Minutes Intravenous Every 24 hours 04/14/16 1626 04/15/16 1058   04/14/16 0400  vancomycin  (VANCOCIN) 1,500 mg in sodium chloride 0.9 % 250 mL IVPB     1,500 mg 125 mL/hr over 120 Minutes Intravenous To Surgery 04/13/16 1810 04/14/16 1145   04/14/16 0400  vancomycin (VANCOCIN) 1,000 mg in sodium chloride 0.9 % 1,000 mL irrigation      Irrigation To Surgery 04/13/16 1809 04/14/16 1355   04/14/16 0400  levofloxacin (LEVAQUIN) IVPB 500 mg     500 mg 100 mL/hr over 60 Minutes Intravenous To Surgery 04/13/16 1810 04/14/16 1153      Assessment/Plan: s/p Procedure(s): CORONARY ARTERY BYPASS GRAFTING TIMES FOUR    USING LEFT INTERNAL MAMMARY AND ENDOSCOPIC HARVEST RIGHT SAPHENOUS VEIN (N/A) INTRAOPERATIVE TRANSESOPHAGEAL ECHOCARDIOGRAM (N/A) Ileus - resolving well, advance diet and wean TNA Please re-call us PRN  LOS: 13 days    Gabino Hagin E 04/26/2016

## 2016-04-27 LAB — GLUCOSE, CAPILLARY
GLUCOSE-CAPILLARY: 179 mg/dL — AB (ref 65–99)
GLUCOSE-CAPILLARY: 254 mg/dL — AB (ref 65–99)
Glucose-Capillary: 243 mg/dL — ABNORMAL HIGH (ref 65–99)
Glucose-Capillary: 208 mg/dL — ABNORMAL HIGH (ref 65–99)

## 2016-04-27 MED ORDER — VANCOMYCIN HCL 10 G IV SOLR
2500.0000 mg | Freq: Once | INTRAVENOUS | Status: AC
  Administered 2016-04-27: 2500 mg via INTRAVENOUS
  Filled 2016-04-27 (×2): qty 2500

## 2016-04-27 MED ORDER — METFORMIN HCL ER 750 MG PO TB24
750.0000 mg | ORAL_TABLET | Freq: Every day | ORAL | Status: DC
  Administered 2016-04-28: 750 mg via ORAL
  Filled 2016-04-27: qty 1.5
  Filled 2016-04-27: qty 1
  Filled 2016-04-27: qty 1.5
  Filled 2016-04-27: qty 1

## 2016-04-27 MED ORDER — POTASSIUM CHLORIDE CRYS ER 20 MEQ PO TBCR
20.0000 meq | EXTENDED_RELEASE_TABLET | Freq: Two times a day (BID) | ORAL | Status: DC
  Administered 2016-04-27 – 2016-05-06 (×19): 20 meq via ORAL
  Filled 2016-04-27 (×19): qty 1

## 2016-04-27 MED ORDER — VANCOMYCIN HCL 10 G IV SOLR
1250.0000 mg | Freq: Two times a day (BID) | INTRAVENOUS | Status: AC
  Administered 2016-04-27 – 2016-05-01 (×8): 1250 mg via INTRAVENOUS
  Filled 2016-04-27 (×9): qty 1250

## 2016-04-27 MED ORDER — LORAZEPAM 1 MG PO TABS
1.0000 mg | ORAL_TABLET | Freq: Every evening | ORAL | Status: DC | PRN
  Administered 2016-04-27 – 2016-05-05 (×8): 1 mg via ORAL
  Filled 2016-04-27 (×9): qty 1

## 2016-04-27 MED ORDER — AMIODARONE HCL 200 MG PO TABS
200.0000 mg | ORAL_TABLET | Freq: Every day | ORAL | Status: DC
Start: 2016-04-27 — End: 2016-05-06
  Administered 2016-04-27 – 2016-05-06 (×10): 200 mg via ORAL
  Filled 2016-04-27 (×10): qty 1

## 2016-04-27 MED ORDER — FUROSEMIDE 40 MG PO TABS
40.0000 mg | ORAL_TABLET | Freq: Every day | ORAL | Status: DC
Start: 2016-04-27 — End: 2016-05-04
  Administered 2016-04-27 – 2016-05-03 (×7): 40 mg via ORAL
  Filled 2016-04-27 (×7): qty 1

## 2016-04-27 NOTE — Progress Notes (Signed)
Pharmacy Antibiotic Note  Jason AnisJohn C [name] is a 65 y.o. male s/p [name] and noted with cellulitis.  Pharmacy has been consulted for vancomycin dosing. -SCr= 1.01, CrCl >100  Plan: -Vancomycin 2500mg  x1 followed by 1250mg  IV q12h -Will follow renal function and clinical progress   Height: 6\' 4"  (193 cm) Weight: 284 lb 9.6 oz (129.094 kg) IBW/kg (Calculated) : 86.8  Temp (24hrs), Avg:98.2 F (36.8 C), Min:98.1 F (36.7 C), Max:98.2 F (36.8 C)   Recent Labs Lab 04/22/16 0355 04/23/16 0505 04/24/16 0430 04/25/16 0443 04/26/16 0455  WBC 6.2 5.4 7.7  --   --   CREATININE 1.50* 1.11 1.04 1.11 1.01    Estimated Creatinine Clearance: 105.5 mL/min (by C-G formula based on Cr of 1.01).    Allergies  Allergen Reactions  . Aspirin Anaphylaxis  . Inderal [Propranolol]     Other reaction(s): Other (See Comments) Fatigue  . Lovastatin     Other reaction(s): Muscle Pain  . Metformin Hcl Diarrhea  . Penicillin G Hives  . Pravastatin Sodium     Other reaction(s): Muscle Pain  . Simvastatin     Other reaction(s): Muscle Pain  . Penicillins Rash    Has patient had a PCN reaction causing immediate rash, facial/tongue/throat swelling, SOB or lightheadedness with hypotension: Yes Has patient had a PCN reaction causing severe rash involving mucus membranes or skin necrosis: Yes Has patient had a PCN reaction that required hospitalization No Has patient had a PCN reaction occurring within the last 10 years: Yes If all of the above answers are "NO", then may proceed with Cephalosporin use.     Antimicrobials this admission: 7/1 azithromycin>> 7/3 7/3 vancomycin    Microbiology results: none  Thank you for allowing pharmacy to be a part of this patient's care.  Harland GermanAndrew Tiawanna Luchsinger, Pharm D 04/27/2016 8:42 AM

## 2016-04-27 NOTE — Progress Notes (Addendum)
[name]       [place], [id number]             [phone]      13 Days Post-Op Procedure(s) (LRB): CORONARY ARTERY BYPASS GRAFTING TIMES FOUR    USING LEFT INTERNAL MAMMARY AND ENDOSCOPIC HARVEST RIGHT SAPHENOUS VEIN (N/A) INTRAOPERATIVE TRANSESOPHAGEAL ECHOCARDIOGRAM (N/A) Subjective: Feels ok, right leg is sore, tolerating full diet without nausea   Objective: Vital signs in last 24 hours: Temp:  [98.1 F (36.7 C)-98.2 F (36.8 C)] 98.2 F (36.8 C) (07/03 0507) Pulse Rate:  [97-105] 105 (07/03 0507) Cardiac Rhythm:  [-] Sinus tachycardia (07/02 2100) Resp:  [18-20] 18 (07/03 0507) BP: (110-113)/(50-63) 112/50 mmHg (07/03 0507) SpO2:  [96 %-99 %] 96 % (07/03 0507) Weight:  [284 lb 9.6 oz (129.094 kg)] 284 lb 9.6 oz (129.094 kg) (07/03 0507)  Hemodynamic parameters for last 24 hours:    Intake/Output from previous day: 07/02 0701 - 07/03 0700 In: 840 [P.O.:840] Out: 1425 [Urine:1425] Intake/Output this shift:    General appearance: alert, cooperative and no distress Heart: regular rate and rhythm Lungs: clear to auscultation bilaterally Abdomen: soft, nontender Extremities: + edema Wound: RLE erethema is about the same  Lab Results: No results for input(s): WBC, HGB, HCT, PLT in the last 72 hours. BMET:  Recent Labs  04/25/16 0443 04/26/16 0455  NA 136 135  K 3.4* 3.6  CL 104 102  CO2 28 27  GLUCOSE 141* 167*  BUN 13 11  CREATININE 1.11 1.01  CALCIUM 8.5* 8.4*    PT/INR: No results for input(s): LABPROT, INR in the last 72 hours. ABG    Component Value Date/Time   PHART 7.328* 04/14/2016 2006   HCO3 25.0* 04/14/2016 2006   TCO2 26 04/15/2016 1707   ACIDBASEDEF 1.0 04/14/2016 2006   O2SAT 91.0 04/14/2016 2006   CBG (last 3)   Recent Labs  04/26/16 1628 04/26/16 2111 04/27/16 0652  GLUCAP 205* 161* 179*    Meds Scheduled Meds: . atorvastatin  80 mg Oral q1800  . azithromycin  500 mg Intravenous Q24H  .  bisacodyl  10 mg Oral Daily   Or  . bisacodyl  10 mg Rectal Daily  . enoxaparin (LOVENOX) injection  40 mg Subcutaneous Q24H  . gemfibrozil  600 mg Oral BID AC  . insulin aspart  0-24 Units Subcutaneous TID WC & HS  . metoCLOPramide (REGLAN) injection  5 mg Intravenous Q6H  . sodium chloride flush  3 mL Intravenous Q12H   Continuous Infusions: . amiodarone 15 mg/hr (04/27/16 0017)  . dextrose 5 % and 0.45 % NaCl with KCl 20 mEq/L 10 mL/hr at 04/25/16 1843   PRN Meds:.acetaminophen, antiseptic oral rinse, gi cocktail, lactulose, LORazepam, metoprolol, ondansetron (ZOFRAN) IV, oxyCODONE, phenol, sodium chloride flush, sodium chloride flush, traMADol  Xrays No results found.  Assessment/Plan: S/P Procedure(s) (LRB): CORONARY ARTERY BYPASS GRAFTING TIMES FOUR    USING LEFT INTERNAL MAMMARY AND ENDOSCOPIC HARVEST RIGHT SAPHENOUS VEIN (N/A) INTRAOPERATIVE TRANSESOPHAGEAL ECHOCARDIOGRAM (N/A)  1 ileus appears resolved 2 cellulitis- change to vancomycin as not making progress with zithromax 3 change amio to po 4 restart metformin  LOS: 14 days    Jason [name],Jason [name] 04/27/2016   Chart reviewed, patient examined, agree with above. He still have significant cellulitis in right leg along vein harvest tunnel and both legs still edematous. Weight is 7 lbs over preop. Agree with switching antibiotic to vancomycin. Will start lasix 40 mg daily and  KCL 20 meg bid. Ileus is resolved but still having some loose stool. Expect this will gradually get back to normal. Stop Reglan and lactulose.

## 2016-04-27 NOTE — Progress Notes (Signed)
CARDIAC REHAB PHASE I   PRE:  Rate/Rhythm: 105 ST    BP: sitting 115/68    SaO2: 95 RA  MODE:  Ambulation: 350 ft   POST:  Rate/Rhythm: 133 ST    BP: sitting 142/76     SaO2: 97 RA  Pt smiling, ready to walk. C/o right leg pain, making it difficult to stand from low surface. Max assist x1 with gait belt for him to stand. He still wants to use his arms for chair mobility. Gave reminders again. Walked across room without RW, slightly unsteady/unsure. Steady walking with RW, HR up to 133 ST. Tired toward end.  Pt talked entire walk, no rest needed. Will f/u, encouraged x1-2 more walks today. [id number]  Harriet MassonRandi Kristan Ignacio Lowder CES, ACSM 04/27/2016 11:17 AM

## 2016-04-27 NOTE — Care Management Important Message (Signed)
Important Message  Patient Details  Name: Jason [name] MRN: [id number] Date of Birth: 08/07/1951   Medicare Important Message Given:  Yes    [name] 04/27/2016, 10:45 AM

## 2016-04-28 LAB — GLUCOSE, CAPILLARY
GLUCOSE-CAPILLARY: 164 mg/dL — AB (ref 65–99)
GLUCOSE-CAPILLARY: 235 mg/dL — AB (ref 65–99)
Glucose-Capillary: 176 mg/dL — ABNORMAL HIGH (ref 65–99)
Glucose-Capillary: 223 mg/dL — ABNORMAL HIGH (ref 65–99)

## 2016-04-28 MED ORDER — CARVEDILOL 3.125 MG PO TABS
3.1250 mg | ORAL_TABLET | Freq: Two times a day (BID) | ORAL | Status: DC
Start: 2016-04-28 — End: 2016-05-04
  Administered 2016-04-28 – 2016-05-04 (×7): 3.125 mg via ORAL
  Filled 2016-04-28 (×13): qty 1

## 2016-04-28 MED ORDER — ACETAMINOPHEN 325 MG PO TABS
325.0000 mg | ORAL_TABLET | Freq: Four times a day (QID) | ORAL | Status: DC | PRN
  Administered 2016-04-28: 325 mg via ORAL
  Filled 2016-04-28: qty 1

## 2016-04-28 NOTE — Progress Notes (Addendum)
      [address]       [place] [id number]             [phone]      14 Days Post-Op Procedure(s) (LRB): CORONARY ARTERY BYPASS GRAFTING TIMES FOUR    USING LEFT INTERNAL MAMMARY AND ENDOSCOPIC HARVEST RIGHT SAPHENOUS VEIN (N/A) INTRAOPERATIVE TRANSESOPHAGEAL ECHOCARDIOGRAM (N/A)   Subjective:  [name] continues to have RLE pain.    Objective: Vital signs in last 24 hours: Temp:  [97.7 F (36.5 C)-98.6 F (37 C)] 98.6 F (37 C) (07/04 0617) Pulse Rate:  [108-110] 109 (07/04 0617) Cardiac Rhythm:  [-] Sinus tachycardia;Heart block (07/04 0700) Resp:  [17-18] 17 (07/04 0617) BP: (106-114)/(51-63) 107/51 mmHg (07/04 0617) SpO2:  [95 %-98 %] 95 % (07/04 0617) Weight:  [280 lb 3.3 oz (127.1 kg)] 280 lb 3.3 oz (127.1 kg) (07/04 0617)  Intake/Output from previous day: 07/03 0701 - 07/04 0700 In: 500 [IV Piggyback:500] Out: 2925 [Urine:2925]  General appearance: alert, cooperative and no distress Heart: regular rate and rhythm and tachy Lungs: clear to auscultation bilaterally Abdomen: soft, non-tender; bowel sounds normal; no masses,  no organomegaly Extremities: edema 2-3+ R >L Wound: Sternotomy C/D/I... RLE erythematous, warm to touch, black eschar on EVH site  Lab Results: No results for input(s): WBC, HGB, HCT, PLT in the last 72 hours. BMET:  Recent Labs  04/26/16 0455  NA 135  K 3.6  CL 102  CO2 27  GLUCOSE 167*  BUN 11  CREATININE 1.01  CALCIUM 8.4*    PT/INR: No results for input(s): LABPROT, INR in the last 72 hours. ABG    Component Value Date/Time   PHART 7.328* 04/14/2016 2006   HCO3 25.0* 04/14/2016 2006   TCO2 26 04/15/2016 1707   ACIDBASEDEF 1.0 04/14/2016 2006   O2SAT 91.0 04/14/2016 2006   CBG (last 3)   Recent Labs  04/27/16 1618 04/27/16 2124 04/28/16 0615  GLUCAP 243* 208* 164*    Assessment/Plan: S/P Procedure(s) (LRB): CORONARY ARTERY BYPASS GRAFTING TIMES FOUR    USING LEFT INTERNAL MAMMARY AND  ENDOSCOPIC HARVEST RIGHT SAPHENOUS VEIN (N/A) INTRAOPERATIVE TRANSESOPHAGEAL ECHOCARDIOGRAM (N/A)  1. CV- Sinus Tachy, labile BP- on Amiodarone 200 daily QT mildly elevated at 478, will start low dose BB if BP allows 2. Pulm- no acute issues, continue IS 3. Renal- creatinine has been WNL, remains hypervolemic on exam, weight is trending down... Continue Lasix... Repeat BMET in AM 4. ID- RLE cellulitis- continue Vancomycin, afebrile 5. GI- no N/V, tolerating diet... Ileus resolved, stool softner stopped for loose stool 6. Dispo- patient with tachycardia...try low dose BB if BP allows, continue Vanc for cellulitis, continue diuretics   LOS: 15 days    [name], [name] 04/28/2016  Patient seen and examined, agree with above Cellulitis is primary problem currently- on IV vancomycin Ileus resolved  Viviann SpareSteven C. Dorris FetchHendrickson, MD [facility] [phone]

## 2016-04-28 NOTE — Progress Notes (Signed)
Pt declined ambulating today. Pt reported that his right leg is throbbing. Pt received tylenol per pt request and ice pack was applied to leg. Pt encouraged to ambulate if tolerable. Will continue to monitor.  Berdine DanceLauren Moffitt BSN, RN

## 2016-04-29 LAB — CBC
HCT: 26.9 % — ABNORMAL LOW (ref 39.0–52.0)
Hemoglobin: 8.5 g/dL — ABNORMAL LOW (ref 13.0–17.0)
MCH: 29.3 pg (ref 26.0–34.0)
MCHC: 31.6 g/dL (ref 30.0–36.0)
MCV: 92.8 fL (ref 78.0–100.0)
PLATELETS: 228 10*3/uL (ref 150–400)
RBC: 2.9 MIL/uL — AB (ref 4.22–5.81)
RDW: 14 % (ref 11.5–15.5)
WBC: 7.4 10*3/uL (ref 4.0–10.5)

## 2016-04-29 LAB — GLUCOSE, CAPILLARY
GLUCOSE-CAPILLARY: 203 mg/dL — AB (ref 65–99)
Glucose-Capillary: 147 mg/dL — ABNORMAL HIGH (ref 65–99)
Glucose-Capillary: 219 mg/dL — ABNORMAL HIGH (ref 65–99)
Glucose-Capillary: 135 mg/dL — ABNORMAL HIGH (ref 65–99)

## 2016-04-29 LAB — BASIC METABOLIC PANEL
Anion gap: 7 (ref 5–15)
BUN: 10 mg/dL (ref 6–20)
CHLORIDE: 100 mmol/L — AB (ref 101–111)
CO2: 28 mmol/L (ref 22–32)
CREATININE: 1.14 mg/dL (ref 0.61–1.24)
Calcium: 8.6 mg/dL — ABNORMAL LOW (ref 8.9–10.3)
Glucose, Bld: 148 mg/dL — ABNORMAL HIGH (ref 65–99)
Potassium: 3.9 mmol/L (ref 3.5–5.1)
SODIUM: 135 mmol/L (ref 135–145)

## 2016-04-29 MED ORDER — GLIMEPIRIDE 4 MG PO TABS
4.0000 mg | ORAL_TABLET | Freq: Two times a day (BID) | ORAL | Status: DC
  Administered 2016-04-29 – 2016-05-06 (×13): 4 mg via ORAL
  Filled 2016-04-29 (×14): qty 1

## 2016-04-29 NOTE — Progress Notes (Addendum)
Nutrition Follow Up  DOCUMENTATION CODES:   Obesity unspecified  INTERVENTION:    Continue Soft diet  NUTRITION DIAGNOSIS:   Inadequate oral intake related to altered GI function as evidenced by NPO status, resolved   GOAL:   Patient will meet greater than or equal to 90% of their needs, met  MONITOR:   PO intake, Labs, Weight trends, I & O's  ASSESSMENT:   65 yo Male with no previous history of coronary artery disease but risk factors notable for history of essential hypertension, type 2 diabetes mellitus, and mixed hyperlipidemia who presents with symptoms of chest pain consistent with unstable angina pectoris and has been transferred from [facility] for management of 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   Patient s/p procedure 6/20: CORONARY BYPASS GRAFTING x 4  Advanced to Soft diet 7/2. [name] discontinued 7/3. PO intake 100% per flowsheet records. Abdominal exam normal and ileus resolved.  Diet Order:  DIET SOFT Room service appropriate?: Yes; Fluid consistency:: Thin  Skin:  Reviewed, no issues  Last BM:  7/5  Height:   Ht Readings from Last 1 Encounters:  04/13/16 6' 4" (1.93 m)    Weight:   Wt Readings from Last 1 Encounters:  04/28/16 280 lb 3.3 oz (127.1 kg)    Ideal Body Weight:  92 kg  BMI:  Body mass index is 34.12 kg/(m^2).  Estimated Nutritional Needs:   Kcal:  2200-2400  Protein:  110-120 gm  Fluid:  2.2-2.4 L  EDUCATION NEEDS:   No education needs identified at this time  Arthur Holms, RD, LDN Pager #: [phone] After-Hours Pager #: [phone]

## 2016-04-29 NOTE — Progress Notes (Signed)
CARDIAC REHAB PHASE I   PRE:  Rate/Rhythm: 101 ST  BP:  Sitting: 115/66        SaO2: 93 RA  MODE:  Ambulation: 350 ft   POST:  Rate/Rhythm: 123 ST  BP:  Sitting: 128/67         SaO2: 95 RA  Pt c/o RLE pain, ok per RN to ambulate. Pt found to be using snuff in room, counseled pt on risk of nicotine use and need for cessation, RN notified as well. Pt required x2 assist to stand, use of gait belt, pt continues to not comply with sternal precautions, uses arms to stand/sit, despite verbal cues. Pt ambulated 350 ft on RA, IV, gait belt, assist x2, fairly steady gait, tolerated well, no complaints other than leg pain. Encouraged additional ambulation today. Pt to recliner after walk, feet elevated, call bell within reach. Will follow.    [phone] Joylene GrapesEmily C Ailany Koren, RN, BSN 04/29/2016 11:02 AM

## 2016-04-29 NOTE — Progress Notes (Addendum)
[address]       [place] [id number]             [phone]      15 Days Post-Op Procedure(s) (LRB): CORONARY ARTERY BYPASS GRAFTING TIMES FOUR    USING LEFT INTERNAL MAMMARY AND ENDOSCOPIC HARVEST RIGHT SAPHENOUS VEIN (N/A) INTRAOPERATIVE TRANSESOPHAGEAL ECHOCARDIOGRAM (N/A) Subjective: Feels a bit better, right leg a little less sore  Objective: Vital signs in last 24 hours: Temp:  [98.2 F (36.8 C)-98.5 F (36.9 C)] 98.3 F (36.8 C) (07/05 0443) Pulse Rate:  [98-109] 98 (07/05 0649) Cardiac Rhythm:  [-] Sinus tachycardia (07/04 1900) Resp:  [16-18] 18 (07/05 0443) BP: (106-125)/(56-63) 106/58 mmHg (07/05 0649) SpO2:  [95 %-98 %] 95 % (07/05 0443)  Hemodynamic parameters for last 24 hours:    Intake/Output from previous day: 07/04 0701 - 07/05 0700 In: 480 [P.O.:480] Out: 975 [Urine:975] Intake/Output this shift:    General appearance: alert, cooperative and no distress Heart: regular rate and rhythm Lungs: clear to auscultation bilaterally Abdomen: soft, nontender Extremities: + R>L edema Wound: RLE still with pretibial erethema, slightly less  Lab Results:  Recent Labs  04/29/16 0429  WBC 7.4  HGB 8.5*  HCT 26.9*  PLT 228   BMET:  Recent Labs  04/29/16 0429  NA 135  K 3.9  CL 100*  CO2 28  GLUCOSE 148*  BUN 10  CREATININE 1.14  CALCIUM 8.6*    PT/INR: No results for input(s): LABPROT, INR in the last 72 hours. ABG    Component Value Date/Time   PHART 7.328* 04/14/2016 2006   HCO3 25.0* 04/14/2016 2006   TCO2 26 04/15/2016 1707   ACIDBASEDEF 1.0 04/14/2016 2006   O2SAT 91.0 04/14/2016 2006   CBG (last 3)   Recent Labs  04/28/16 1633 04/28/16 2151 04/29/16 0631  GLUCAP 223* 235* 147*    Meds Scheduled Meds: . amiodarone  200 mg Oral Daily  . atorvastatin  80 mg Oral q1800  . bisacodyl  10 mg Oral Daily   Or  . bisacodyl  10 mg Rectal Daily  . carvedilol  3.125 mg Oral BID WC  . enoxaparin  (LOVENOX) injection  40 mg Subcutaneous Q24H  . furosemide  40 mg Oral Daily  . gemfibrozil  600 mg Oral BID AC  . insulin aspart  0-24 Units Subcutaneous TID WC & HS  . metFORMIN  750 mg Oral Q breakfast  . potassium chloride  20 mEq Oral BID  . sodium chloride flush  3 mL Intravenous Q12H  . vancomycin  1,250 mg Intravenous Q12H   Continuous Infusions:  PRN Meds:.acetaminophen, acetaminophen, antiseptic oral rinse, gi cocktail, LORazepam, metoprolol, ondansetron (ZOFRAN) IV, oxyCODONE, phenol, sodium chloride flush, sodium chloride flush, traMADol  Xrays No results found.  Assessment/Plan: S/P Procedure(s) (LRB): CORONARY ARTERY BYPASS GRAFTING TIMES FOUR    USING LEFT INTERNAL MAMMARY AND ENDOSCOPIC HARVEST RIGHT SAPHENOUS VEIN (N/A) INTRAOPERATIVE TRANSESOPHAGEAL ECHOCARDIOGRAM (N/A)  1 1 steady progress , cont vancomycin, cellulitis slowly improving 2 he says metformin gives him loose stools- will stop, restart amaryl, hold on invokana 3 cont gentle diuresis- improved edema 4 labs stable, creat stable   LOS: 16 days    [name],[name] 04/29/2016  Abdominal  exam now normal - ileus resolved Severe LLE cellulitis needs IV antibiotics and daily monitoring in hospital- patient cannot walk due to leg pain patient examined and medical record reviewed,agree with above note. Kathlee Nationseter Van Trigt III 04/29/2016

## 2016-04-30 LAB — GLUCOSE, CAPILLARY
GLUCOSE-CAPILLARY: 74 mg/dL (ref 65–99)
GLUCOSE-CAPILLARY: 87 mg/dL (ref 65–99)
GLUCOSE-CAPILLARY: 88 mg/dL (ref 65–99)
Glucose-Capillary: 124 mg/dL — ABNORMAL HIGH (ref 65–99)
Glucose-Capillary: 91 mg/dL (ref 65–99)

## 2016-04-30 MED ORDER — IMIPENEM-CILASTATIN 500 MG IV SOLR
500.0000 mg | Freq: Four times a day (QID) | INTRAVENOUS | Status: DC
  Administered 2016-04-30 – 2016-05-06 (×24): 500 mg via INTRAVENOUS
  Filled 2016-04-30 (×27): qty 500

## 2016-04-30 NOTE — Progress Notes (Signed)
CARDIAC REHAB PHASE I   PRE:  Rate/Rhythm: 102 ST    BP: sitting 116/64    SaO2: 94 RA  MODE:  Ambulation: 550 ft   POST:  Rate/Rhythm: 129 ST    BP: sitting 122/69     SaO2: 95 RA  Pt with less leg pain today. Able to stand with mod assist (max x2 yesterday).  Increased distance with x1 rest. HR up to 129 ST. Minimal c/o. Return to recliner. Encouraged x2 more walks. [phone]  Harriet MassonRandi Kristan Asa Baudoin CES, ACSM 04/30/2016 8:41 AM

## 2016-04-30 NOTE — Progress Notes (Signed)
Pharmacy Antibiotic Note  Jason [name] is a 65 y.o. male s/p [name] and noted with cellulitis, still signficant. Pharmacy has been consulted for vancomycin dosing. Also started on imipenem today per MD. No hx seizures noted. -SCr= 1.01>1.14 (from 7/5), normalized CrCl~64. AF, wbc wnl, no cx.  Plan: -Vancomycin 1250mg  IV q12h -Imipenem 500mg  IV q6h per PVT -Monitor clinical progress, c/s, renal function, abx plan/LOT -VT 7/7 at 0930   Height: 6\' 4"  (193 cm) Weight: 281 lb 4.8 oz (127.597 kg) IBW/kg (Calculated) : 86.8  Temp (24hrs), Avg:98.5 F (36.9 C), Min:98.5 F (36.9 C), Max:98.6 F (37 C)   Recent Labs Lab 04/24/16 0430 04/25/16 0443 04/26/16 0455 04/29/16 0429  WBC 7.7  --   --  7.4  CREATININE 1.04 1.11 1.01 1.14    Estimated Creatinine Clearance: 93 mL/min (by C-G formula based on Cr of 1.14).    Allergies  Allergen Reactions  . Aspirin Anaphylaxis  . Inderal [Propranolol]     Other reaction(s): Other (See Comments) Fatigue  . Lovastatin     Other reaction(s): Muscle Pain  . Metformin Hcl Diarrhea  . Penicillin G Hives  . Pravastatin Sodium     Other reaction(s): Muscle Pain  . Simvastatin     Other reaction(s): Muscle Pain  . Penicillins Rash    Has patient had a PCN reaction causing immediate rash, facial/tongue/throat swelling, SOB or lightheadedness with hypotension: Yes Has patient had a PCN reaction causing severe rash involving mucus membranes or skin necrosis: Yes Has patient had a PCN reaction that required hospitalization No Has patient had a PCN reaction occurring within the last 10 years: Yes If all of the above answers are "NO", then may proceed with Cephalosporin use.     Antimicrobials this admission: 7/1 azithromycin>> 7/3 7/3 vancomycin>> 7/6 imipenem>>  Microbiology results: none   Jason BertinHaley Stiles [name], PharmD, [facility] HospitalBCPS Clinical Pharmacist Pager [phone] 04/30/2016 10:42 AM

## 2016-04-30 NOTE — Care Management Important Message (Signed)
Important Message  Patient Details  Name: Jason [name] MRN: [id number] Date of Birth: 12/14/1950   Medicare Important Message Given:  Yes    Bernadette HoitShoffner, Lillyrose Reitan Coleman 04/30/2016, 10:29 AM

## 2016-04-30 NOTE — Progress Notes (Addendum)
[address]       [place] [id number]             [phone]      16 Days Post-Op Procedure(s) (LRB): CORONARY ARTERY BYPASS GRAFTING TIMES FOUR    USING LEFT INTERNAL MAMMARY AND ENDOSCOPIC HARVEST RIGHT SAPHENOUS VEIN (N/A) INTRAOPERATIVE TRANSESOPHAGEAL ECHOCARDIOGRAM (N/A) Subjective: Looks and feels better, no abdominal sx  Objective: Vital signs in last 24 hours: Temp:  [98.5 F (36.9 C)-98.6 F (37 C)] 98.5 F (36.9 C) (07/06 0532) Pulse Rate:  [98-111] 99 (07/06 0532) Cardiac Rhythm:  [-] Sinus tachycardia (07/05 1900) Resp:  [16-18] 16 (07/06 0532) BP: (102-117)/(51-62) 102/62 mmHg (07/06 0532) SpO2:  [96 %-97 %] 96 % (07/06 0532) Weight:  [281 lb 4.8 oz (127.597 kg)] 281 lb 4.8 oz (127.597 kg) (07/06 0532)  Hemodynamic parameters for last 24 hours:    Intake/Output from previous day: 07/05 0701 - 07/06 0700 In: 970 [P.O.:960; I.V.:10] Out: 3050 [Urine:3050] Intake/Output this shift:    General appearance: alert, cooperative and no distress Heart: regular rate and rhythm Lungs: mildly dim in right base Abdomen: benign Extremities: R>L LE edema Wound: right leg cellulitis slowly improving  Lab Results:  Recent Labs  04/29/16 0429  WBC 7.4  HGB 8.5*  HCT 26.9*  PLT 228   BMET:  Recent Labs  04/29/16 0429  NA 135  K 3.9  CL 100*  CO2 28  GLUCOSE 148*  BUN 10  CREATININE 1.14  CALCIUM 8.6*    PT/INR: No results for input(s): LABPROT, INR in the last 72 hours. ABG    Component Value Date/Time   PHART 7.328* 04/14/2016 2006   HCO3 25.0* 04/14/2016 2006   TCO2 26 04/15/2016 1707   ACIDBASEDEF 1.0 04/14/2016 2006   O2SAT 91.0 04/14/2016 2006   CBG (last 3)   Recent Labs  04/29/16 2053 04/30/16 0629 04/30/16 0726  GLUCAP 135* 74 87    Meds Scheduled Meds: . amiodarone  200 mg Oral Daily  . atorvastatin  80 mg Oral q1800  . bisacodyl  10 mg Oral Daily   Or  . bisacodyl  10 mg Rectal Daily  .  carvedilol  3.125 mg Oral BID WC  . enoxaparin (LOVENOX) injection  40 mg Subcutaneous Q24H  . furosemide  40 mg Oral Daily  . gemfibrozil  600 mg Oral BID AC  . glimepiride  4 mg Oral BID AC  . insulin aspart  0-24 Units Subcutaneous TID WC & HS  . metFORMIN  750 mg Oral Q breakfast  . potassium chloride  20 mEq Oral BID  . sodium chloride flush  3 mL Intravenous Q12H  . vancomycin  1,250 mg Intravenous Q12H   Continuous Infusions:  PRN Meds:.acetaminophen, acetaminophen, antiseptic oral rinse, gi cocktail, LORazepam, metoprolol, ondansetron (ZOFRAN) IV, oxyCODONE, phenol, sodium chloride flush, sodium chloride flush, traMADol  Xrays No results found.  Assessment/Plan: S/P Procedure(s) (LRB): CORONARY ARTERY BYPASS GRAFTING TIMES FOUR    USING LEFT INTERNAL MAMMARY AND ENDOSCOPIC HARVEST RIGHT SAPHENOUS VEIN (N/A) INTRAOPERATIVE TRANSESOPHAGEAL ECHOCARDIOGRAM (N/A)  1 slow improvement of right leg cellulitis  2 steady clinical progress in other recovery aspects- push rehab as able   LOS: 17 days    GOLD,WAYNE E 04/30/2016 Patient examined. Patient continues to have significant cellulitis on IV vancomycin No localized fluctuance or area to drain. No fever or leukocytosis. Patient needs coverage of possible microaerophilic strep. He has allergy to penicillin. We will start  Primaxin unless pharmacy has other recommendation. Kathlee NationsPeter Van trigt M.D.

## 2016-04-30 NOTE — Progress Notes (Signed)
Inpatient Diabetes Program Recommendations  AACE/ADA: New Consensus Statement on Inpatient Glycemic Control (2015)  Target Ranges:  Prepandial:   less than 140 mg/dL      Peak postprandial:   less than 180 mg/dL (1-2 hours)      Critically ill patients:  140 - 180 mg/dL   Lab Results  Component Value Date   GLUCAP 87 04/30/2016   HGBA1C 7.6* 04/13/2016    Review of Glycemic ControlResults for Jason [name], Jason [name] (MRN [id number]) as of 04/30/2016 10:02  Ref. Range 04/29/2016 06:31 04/29/2016 11:31 04/29/2016 16:12 04/29/2016 20:53 04/30/2016 06:29 04/30/2016 07:26  Glucose-Capillary Latest Ref Range: 65-99 mg/dL 119147 (H) 147203 (H) 829219 (H) 135 (H) 74 87   Diabetes history: Type 2 diabetes Outpatient Diabetes medications: Amaryl 4 mg bid, Metformin XR- 750 mg with breakfast, Invokana 300 mg with breakfast Current orders for Inpatient glycemic control:  TCTS tid with meals and HS, Amaryl 4 mg bid  Inpatient Diabetes Program Recommendations:    Please consider reducing Novolog correction to moderate and HS scale.  Also consider adding Novolog 3 units tid with meals-Hold if patient eats less than 50%.   Thanks, Beryl MeagerJenny Tiffancy Moger, RN, BC-ADM Inpatient Diabetes Coordinator Pager [phone] (8a-5p)

## 2016-05-01 DIAGNOSIS — K567 Ileus, unspecified: Secondary | ICD-10-CM | POA: Diagnosis not present

## 2016-05-01 DIAGNOSIS — K9189 Other postprocedural complications and disorders of digestive system: Secondary | ICD-10-CM

## 2016-05-01 DIAGNOSIS — L0390 Cellulitis, unspecified: Secondary | ICD-10-CM | POA: Diagnosis not present

## 2016-05-01 LAB — BASIC METABOLIC PANEL
Anion gap: 5 (ref 5–15)
BUN: 8 mg/dL (ref 6–20)
CALCIUM: 8.8 mg/dL — AB (ref 8.9–10.3)
CO2: 29 mmol/L (ref 22–32)
Chloride: 101 mmol/L (ref 101–111)
Creatinine, Ser: 1.09 mg/dL (ref 0.61–1.24)
GFR calc Af Amer: >60 mL/min (ref >60)
GLUCOSE: 52 mg/dL — AB (ref 65–99)
POTASSIUM: 3.6 mmol/L (ref 3.5–5.1)
SODIUM: 135 mmol/L (ref 135–145)

## 2016-05-01 LAB — GLUCOSE, CAPILLARY
GLUCOSE-CAPILLARY: 104 mg/dL — AB (ref 65–99)
GLUCOSE-CAPILLARY: 160 mg/dL — AB (ref 65–99)
Glucose-Capillary: 201 mg/dL — ABNORMAL HIGH (ref 65–99)
Glucose-Capillary: 168 mg/dL — ABNORMAL HIGH (ref 65–99)

## 2016-05-01 LAB — VANCOMYCIN, TROUGH: VANCOMYCIN TR: 19 ug/mL (ref 15–20)

## 2016-05-01 MED ORDER — VANCOMYCIN HCL IN DEXTROSE 1-5 GM/200ML-% IV SOLN
1000.0000 mg | Freq: Two times a day (BID) | INTRAVENOUS | Status: DC
  Administered 2016-05-01 – 2016-05-05 (×9): 1000 mg via INTRAVENOUS
  Filled 2016-05-01 (×11): qty 200

## 2016-05-01 MED ORDER — CLOPIDOGREL BISULFATE 75 MG PO TABS
75.0000 mg | ORAL_TABLET | Freq: Every day | ORAL | Status: DC
  Administered 2016-05-01 – 2016-05-06 (×6): 75 mg via ORAL
  Filled 2016-05-01 (×7): qty 1

## 2016-05-01 NOTE — Progress Notes (Signed)
CARDIAC REHAB PHASE I   PRE:  Rate/Rhythm: 103 ST  BP:  Sitting: 112/68        SaO2: 95 RA  MODE:  Ambulation: 550 ft   POST:  Rate/Rhythm: 119 ST  BP:  Sitting: 128/70         SaO2: 95 RA  Pt c/o soreness in his sternum today, pt continues to use arms for mobility despite verbal cues/demonstration for sternal precautions. Pt ambulated 550 ft on RA, IV, rolling walker, assist x1, fairly steady gait, tolerated well with no complaints other than sternal soreness.  Encouraged additional ambulation x2 today, IS. Pt to recliner after walk, call bell within reach. Will follow.   [phone] Joylene GrapesEmily C Kessie Croston, RN, BSN 05/01/2016 11:55 AM

## 2016-05-01 NOTE — Progress Notes (Signed)
Inpatient Diabetes Program Recommendations  AACE/ADA: New Consensus Statement on Inpatient Glycemic Control (2015)  Target Ranges:  Prepandial:   less than 140 mg/dL      Peak postprandial:   less than 180 mg/dL (1-2 hours)      Critically ill patients:  140 - 180 mg/dL   Lab Results  Component Value Date   GLUCAP 104* 05/01/2016   HGBA1C 7.6* 04/13/2016    Review of Glycemic Control  Inpatient Diabetes Program Recommendations:  Oral Agents: oral antihyperglycemics are not recommended in the hospital. Consider discontinuing Amaryl and Metformin while in hospital. Thank you  Jason ClimesGina Kimbella Heisler MSN, RN,CDE Inpatient Diabetes Coordinator [phone] (team pager)

## 2016-05-01 NOTE — Progress Notes (Signed)
Pharmacy Antibiotic Note  Elpidio AnisJohn C Faso is a 65 y.o. male s/p [name] and noted with cellulitis, still signficant. Pharmacy has been consulted for vancomycin dosing, also on imipenem per MD. No hx seizures noted. -SCr= 1.09, wbc wnl, no cx. -vancomycin trough= 19 (goal 10-15)  Plan: -Change Vancomycin to  1000mg  IV q12h -Imipenem 500mg  IV q6h  -Monitor clinical progress, renal function, abx plan/LOT   Height: 6\' 4"  (193 cm) Weight: 275 lb 3.2 oz (124.83 kg) IBW/kg (Calculated) : 86.8  Temp (24hrs), Avg:98.3 F (36.8 C), Min:98.2 F (36.8 C), Max:98.5 F (36.9 C)   Recent Labs Lab 04/25/16 0443 04/26/16 0455 04/29/16 0429 05/01/16 0433 05/01/16 1005  WBC  --   --  7.4  --   --   CREATININE 1.11 1.01 1.14 1.09  --   VANCOTROUGH  --   --   --   --  19    Estimated Creatinine Clearance: 96.2 mL/min (by C-G formula based on Cr of 1.09).    Allergies  Allergen Reactions  . Aspirin Anaphylaxis  . Inderal [Propranolol]     Other reaction(s): Other (See Comments) Fatigue  . Lovastatin     Other reaction(s): Muscle Pain  . Metformin Hcl Diarrhea  . Penicillin G Hives  . Pravastatin Sodium     Other reaction(s): Muscle Pain  . Simvastatin     Other reaction(s): Muscle Pain  . Penicillins Rash    Has patient had a PCN reaction causing immediate rash, facial/tongue/throat swelling, SOB or lightheadedness with hypotension: Yes Has patient had a PCN reaction causing severe rash involving mucus membranes or skin necrosis: Yes Has patient had a PCN reaction that required hospitalization No Has patient had a PCN reaction occurring within the last 10 years: Yes If all of the above answers are "NO", then may proceed with Cephalosporin use.     Antimicrobials this admission: 7/1 azithromycin>> 7/3 7/3 vancomycin>> 7/6 imipenem>>  Microbiology results: none  Harland Germanndrew Bradd Merlos, Pharm D 05/01/2016 11:10 AM

## 2016-05-01 NOTE — Progress Notes (Signed)
      [address]       [place] [id number]             [phone]      17 Days Post-Op Procedure(s) (LRB): CORONARY ARTERY BYPASS GRAFTING TIMES FOUR    USING LEFT INTERNAL MAMMARY AND ENDOSCOPIC HARVEST RIGHT SAPHENOUS VEIN (N/A) INTRAOPERATIVE TRANSESOPHAGEAL ECHOCARDIOGRAM (N/A) Subjective: Feels ok, leg is slowly feeling better  Objective: Vital signs in last 24 hours: Temp:  [98.2 F (36.8 C)-98.5 F (36.9 C)] 98.2 F (36.8 C) (07/07 0512) Pulse Rate:  [97-108] 100 (07/07 0512) Cardiac Rhythm:  [-] Sinus tachycardia;Bundle branch block (07/06 1903) Resp:  [16-18] 16 (07/07 0512) BP: (113-132)/(64-75) 113/66 mmHg (07/07 0512) SpO2:  [93 %-98 %] 93 % (07/07 0512) Weight:  [275 lb 3.2 oz (124.83 kg)] 275 lb 3.2 oz (124.83 kg) (07/07 0512)  Hemodynamic parameters for last 24 hours:    Intake/Output from previous day: 07/06 0701 - 07/07 0700 In: 601 [P.O.:598; I.V.:3] Out: 2425 [Urine:2425] Intake/Output this shift:    General appearance: alert, cooperative and no distress Heart: regular rate and rhythm Lungs: clear to auscultation bilaterally Abdomen: benign Extremities: less edema Wound: r leg cellulitis is about the same or slightly better  Lab Results:  Recent Labs  04/29/16 0429  WBC 7.4  HGB 8.5*  HCT 26.9*  PLT 228   BMET:  Recent Labs  04/29/16 0429 05/01/16 0433  NA 135 135  K 3.9 3.6  CL 100* 101  CO2 28 29  GLUCOSE 148* 52*  BUN 10 8  CREATININE 1.14 1.09  CALCIUM 8.6* 8.8*    PT/INR: No results for input(s): LABPROT, INR in the last 72 hours. ABG    Component Value Date/Time   PHART 7.328* 04/14/2016 2006   HCO3 25.0* 04/14/2016 2006   TCO2 26 04/15/2016 1707   ACIDBASEDEF 1.0 04/14/2016 2006   O2SAT 91.0 04/14/2016 2006   CBG (last 3)   Recent Labs  04/30/16 1625 04/30/16 2123 05/01/16 0600  GLUCAP 124* 91 104*    Meds Scheduled Meds: . amiodarone  200 mg Oral Daily  . atorvastatin  80 mg Oral  q1800  . bisacodyl  10 mg Oral Daily   Or  . bisacodyl  10 mg Rectal Daily  . carvedilol  3.125 mg Oral BID WC  . enoxaparin (LOVENOX) injection  40 mg Subcutaneous Q24H  . furosemide  40 mg Oral Daily  . gemfibrozil  600 mg Oral BID AC  . glimepiride  4 mg Oral BID AC  . imipenem-cilastatin  500 mg Intravenous Q6H  . insulin aspart  0-24 Units Subcutaneous TID WC & HS  . metFORMIN  750 mg Oral Q breakfast  . potassium chloride  20 mEq Oral BID  . sodium chloride flush  3 mL Intravenous Q12H  . vancomycin  1,250 mg Intravenous Q12H   Continuous Infusions:  PRN Meds:.acetaminophen, acetaminophen, antiseptic oral rinse, gi cocktail, LORazepam, metoprolol, ondansetron (ZOFRAN) IV, oxyCODONE, phenol, sodium chloride flush, sodium chloride flush, traMADol  Xrays No results found.  Assessment/Plan: S/P Procedure(s) (LRB): CORONARY ARTERY BYPASS GRAFTING TIMES FOUR    USING LEFT INTERNAL MAMMARY AND ENDOSCOPIC HARVEST RIGHT SAPHENOUS VEIN (N/A) INTRAOPERATIVE TRANSESOPHAGEAL ECHOCARDIOGRAM (N/A)  1 stable with cellulitis the primary concern, cont primaxin and vanco   LOS: 18 days    GOLD,WAYNE E 05/01/2016

## 2016-05-02 ENCOUNTER — Inpatient Hospital Stay (HOSPITAL_COMMUNITY): Payer: Medicare Other

## 2016-05-02 DIAGNOSIS — R079 Chest pain, unspecified: Secondary | ICD-10-CM

## 2016-05-02 LAB — GLUCOSE, CAPILLARY
GLUCOSE-CAPILLARY: 91 mg/dL (ref 65–99)
GLUCOSE-CAPILLARY: 179 mg/dL — AB (ref 65–99)
Glucose-Capillary: 234 mg/dL — ABNORMAL HIGH (ref 65–99)
Glucose-Capillary: 227 mg/dL — ABNORMAL HIGH (ref 65–99)

## 2016-05-02 NOTE — Progress Notes (Signed)
CARDIAC REHAB PHASE I   PRE:  Rate/Rhythm: 100 SR  BP:  Supine:   Sitting: 108/61  Standing:    SaO2: 94 RA  MODE:  Ambulation: 550 ft   POST:  Rate/Rhythm: 116 ST  BP:  Supine:   Sitting: 110/64  Standing:    SaO2: 97 RA 1135-1215 Assisted X 1 and used walker to ambulate. Gait steady with walker. Pt c/o of back and hip pain with walking. VS stable. During walk right leg began to ooze blood from the scabbed site. Dressing applied and noitifed RN. Right leg continues swollen encouraged him to elevate it after he finished his lunch.  Melina CopaLisa Trinda Harlacher RN 05/02/2016 12:29 PM

## 2016-05-02 NOTE — Progress Notes (Signed)
VASCULAR LAB PRELIMINARY  PRELIMINARY  PRELIMINARY  PRELIMINARY  Right lower extremity venous duplex completed.    Preliminary report:  There is no DVT or SVT noted in the right lower extremity.   Cyndal Kasson, RVT 05/02/2016, 6:48 PM

## 2016-05-02 NOTE — Progress Notes (Signed)
      [address]       [place] [id number]             [phone]      18 Days Post-Op Procedure(s) (LRB): CORONARY ARTERY BYPASS GRAFTING TIMES FOUR    USING LEFT INTERNAL MAMMARY AND ENDOSCOPIC HARVEST RIGHT SAPHENOUS VEIN (N/A) INTRAOPERATIVE TRANSESOPHAGEAL ECHOCARDIOGRAM (N/A)   Subjective:  [name] complains of leg pain and mild chest soreness today.  Objective: Vital signs in last 24 hours: Temp:  [97.6 F (36.4 C)-98.1 F (36.7 C)] 98 F (36.7 C) (07/08 0600) Pulse Rate:  [95-110] 102 (07/08 0742) Cardiac Rhythm:  [-] Normal sinus rhythm (07/08 0700) Resp:  [18-20] 18 (07/08 0600) BP: (106-124)/(57-79) 124/79 mmHg (07/08 0742) SpO2:  [95 %-96 %] 96 % (07/08 0600) Weight:  [276 lb 8 oz (125.42 kg)] 276 lb 8 oz (125.42 kg) (07/08 0600)  Intake/Output from previous day: 07/07 0701 - 07/08 0700 In: 10 [I.V.:10] Out: 1350 [Urine:1350]  General appearance: alert, cooperative and no distress Heart: regular rate and rhythm Lungs: clear to auscultation bilaterally Abdomen: soft, non-tender; bowel sounds normal; no masses,  no organomegaly Extremities: RLE cellulitic, + edema R>L Wound: clean and dry  Lab Results: No results for input(s): WBC, HGB, HCT, PLT in the last 72 hours. BMET:  Recent Labs  05/01/16 0433  NA 135  K 3.6  CL 101  CO2 29  GLUCOSE 52*  BUN 8  CREATININE 1.09  CALCIUM 8.8*    PT/INR: No results for input(s): LABPROT, INR in the last 72 hours. ABG    Component Value Date/Time   PHART 7.328* 04/14/2016 2006   HCO3 25.0* 04/14/2016 2006   TCO2 26 04/15/2016 1707   ACIDBASEDEF 1.0 04/14/2016 2006   O2SAT 91.0 04/14/2016 2006   CBG (last 3)   Recent Labs  05/01/16 1635 05/01/16 2137 05/02/16 0604  GLUCAP 168* 160* 91    Assessment/Plan: S/P Procedure(s) (LRB): CORONARY ARTERY BYPASS GRAFTING TIMES FOUR    USING LEFT INTERNAL MAMMARY AND ENDOSCOPIC HARVEST RIGHT SAPHENOUS VEIN (N/A) INTRAOPERATIVE  TRANSESOPHAGEAL ECHOCARDIOGRAM (N/A)  1. CV- remains hemodynamically stable 2. RLE Cellulitis- appears to be about the same since 7/4 when I last evaluated patient... Remains afebrile on Vanc and Primaxin 3. Dispo- patient stable, RLE cellulitis continues to be concern, continue IV ABX   LOS: 19 days    [name], [name] 05/02/2016

## 2016-05-03 LAB — GLUCOSE, CAPILLARY
Glucose-Capillary: 85 mg/dL (ref 65–99)
Glucose-Capillary: 167 mg/dL — ABNORMAL HIGH (ref 65–99)
Glucose-Capillary: 242 mg/dL — ABNORMAL HIGH (ref 65–99)
Glucose-Capillary: 220 mg/dL — ABNORMAL HIGH (ref 65–99)

## 2016-05-03 LAB — CBC
HCT: 28.7 % — ABNORMAL LOW (ref 39.0–52.0)
Hemoglobin: 8.9 g/dL — ABNORMAL LOW (ref 13.0–17.0)
MCH: 28.8 pg (ref 26.0–34.0)
MCHC: 31 g/dL (ref 30.0–36.0)
MCV: 92.9 fL (ref 78.0–100.0)
Platelets: 213 10*3/uL (ref 150–400)
RBC: 3.09 MIL/uL — ABNORMAL LOW (ref 4.22–5.81)
RDW: 14.1 % (ref 11.5–15.5)
WBC: 7.6 10*3/uL (ref 4.0–10.5)

## 2016-05-03 LAB — COMPREHENSIVE METABOLIC PANEL
ALT: 10 U/L — ABNORMAL LOW (ref 17–63)
AST: 15 U/L (ref 15–41)
Albumin: 2.3 g/dL — ABNORMAL LOW (ref 3.5–5.0)
Alkaline Phosphatase: 59 U/L (ref 38–126)
Anion gap: 5 (ref 5–15)
BUN: 12 mg/dL (ref 6–20)
CO2: 29 mmol/L (ref 22–32)
Calcium: 8.8 mg/dL — ABNORMAL LOW (ref 8.9–10.3)
Chloride: 99 mmol/L — ABNORMAL LOW (ref 101–111)
Creatinine, Ser: 1.13 mg/dL (ref 0.61–1.24)
GFR calc Af Amer: >60 mL/min (ref >60)
GFR calc non Af Amer: >60 mL/min (ref >60)
Glucose, Bld: 78 mg/dL (ref 65–99)
Potassium: 3.8 mmol/L (ref 3.5–5.1)
Sodium: 133 mmol/L — ABNORMAL LOW (ref 135–145)
Total Bilirubin: 0.7 mg/dL (ref 0.3–1.2)
Total Protein: 5.6 g/dL — ABNORMAL LOW (ref 6.5–8.1)

## 2016-05-03 NOTE — Progress Notes (Addendum)
      [address]       [place] [id number]             [phone]      19 Days Post-Op Procedure(s) (LRB): CORONARY ARTERY BYPASS GRAFTING TIMES FOUR    USING LEFT INTERNAL MAMMARY AND ENDOSCOPIC HARVEST RIGHT SAPHENOUS VEIN (N/A) INTRAOPERATIVE TRANSESOPHAGEAL ECHOCARDIOGRAM (N/A)   Subjective:  No new complaints.  Right leg pain improving.Marland Kitchen. + ambulation  Objective: Vital signs in last 24 hours: Temp:  [98 F (36.7 C)-98.7 F (37.1 C)] 98 F (36.7 C) (07/09 0434) Pulse Rate:  [91-101] 91 (07/09 0434) Cardiac Rhythm:  [-] Heart block (07/09 0700) Resp:  [16-18] 18 (07/09 0434) BP: (100-114)/(54-69) 100/55 mmHg (07/09 0434) SpO2:  [96 %-97 %] 96 % (07/09 0434) Weight:  [280 lb 4.8 oz (127.143 kg)] 280 lb 4.8 oz (127.143 kg) (07/09 0434)  Intake/Output from previous day: 07/08 0701 - 07/09 0700 In: -  Out: 1600 [Urine:1600] Intake/Output this shift: Total I/O In: 236 [P.O.:236] Out: 400 [Urine:400]  General appearance: alert, cooperative and no distress Heart: regular rate and rhythm Lungs: clear to auscultation bilaterally Abdomen: soft, non-tender; bowel sounds normal; no masses,  no organomegaly Wound: RLE remains erythematous, now with dark [facility], remains edematous  Lab Results:  Recent Labs  05/03/16 0515  WBC 7.6  HGB 8.9*  HCT 28.7*  PLT 213   BMET:  Recent Labs  05/01/16 0433 05/03/16 0515  NA 135 133*  K 3.6 3.8  CL 101 99*  CO2 29 29  GLUCOSE 52* 78  BUN 8 12  CREATININE 1.09 1.13  CALCIUM 8.8* 8.8*    PT/INR: No results for input(s): LABPROT, INR in the last 72 hours. ABG    Component Value Date/Time   PHART 7.328* 04/14/2016 2006   HCO3 25.0* 04/14/2016 2006   TCO2 26 04/15/2016 1707   ACIDBASEDEF 1.0 04/14/2016 2006   O2SAT 91.0 04/14/2016 2006   CBG (last 3)   Recent Labs  05/02/16 1635 05/02/16 2140 05/03/16 0623  GLUCAP 234* 227* 85    Assessment/Plan: S/P Procedure(s)  (LRB): CORONARY ARTERY BYPASS GRAFTING TIMES FOUR    USING LEFT INTERNAL MAMMARY AND ENDOSCOPIC HARVEST RIGHT SAPHENOUS VEIN (N/A) INTRAOPERATIVE TRANSESOPHAGEAL ECHOCARDIOGRAM (N/A)  1. CV- hemodynamically stable 2. RLE cellulitis- not significantly changed, now draining dark [facility]old blood) [facility]... Continue Vanc, Primaxin 3. DIspo- patient stable, continue care for cellulitis, Duplex negative for DVT, may benefit from I/D of EVH site to evacuate old blood    LOS: 20 days    [name], [name] 05/03/2016  Leg leg wound inspected-started draining liquefied hematoma from Endo vein harvest tunnel. Tunnel probed with sterile Q-tip and is fairly small in diameter but  5 cm in length. Dressing changes and wound culture ordered. Does not appear to need major surgical debridement or incision and drainage. Continue vancomycin and Primaxin. patient examined and medical record reviewed,agree with above note. Kathlee Nationseter Van Trigt III 05/03/2016

## 2016-05-04 LAB — GLUCOSE, CAPILLARY
GLUCOSE-CAPILLARY: 196 mg/dL — AB (ref 65–99)
GLUCOSE-CAPILLARY: 198 mg/dL — AB (ref 65–99)
Glucose-Capillary: 69 mg/dL (ref 65–99)
Glucose-Capillary: 69 mg/dL (ref 65–99)
Glucose-Capillary: 153 mg/dL — ABNORMAL HIGH (ref 65–99)

## 2016-05-04 MED ORDER — CANAGLIFLOZIN 300 MG PO TABS
300.0000 mg | ORAL_TABLET | Freq: Every day | ORAL | Status: DC
  Administered 2016-05-05 – 2016-05-06 (×2): 300 mg via ORAL
  Filled 2016-05-04 (×2): qty 1

## 2016-05-04 MED ORDER — FUROSEMIDE 40 MG PO TABS
40.0000 mg | ORAL_TABLET | Freq: Every day | ORAL | Status: DC
  Administered 2016-05-04 – 2016-05-06 (×3): 40 mg via ORAL
  Filled 2016-05-04 (×3): qty 1

## 2016-05-04 MED ORDER — LOSARTAN POTASSIUM-HCTZ 100-25 MG PO TABS: 1.0000 | ORAL_TABLET | Freq: Every evening | ORAL | Status: DC

## 2016-05-04 MED ORDER — CARVEDILOL 6.25 MG PO TABS
6.2500 mg | ORAL_TABLET | Freq: Two times a day (BID) | ORAL | Status: DC
Start: 2016-05-04 — End: 2016-05-06
  Administered 2016-05-04 – 2016-05-06 (×4): 6.25 mg via ORAL
  Filled 2016-05-04 (×4): qty 1

## 2016-05-04 MED ORDER — METFORMIN HCL ER 500 MG PO TB24: 1000.0000 mg | ORAL_TABLET | Freq: Every day | ORAL | Status: DC

## 2016-05-04 MED ORDER — METFORMIN HCL ER 500 MG PO TB24
750.0000 mg | ORAL_TABLET | Freq: Every day | ORAL | Status: DC
  Administered 2016-05-05: 750 mg via ORAL
  Filled 2016-05-04 (×2): qty 1.5

## 2016-05-04 MED ORDER — MORPHINE SULFATE (PF) 2 MG/ML IV SOLN
2.0000 mg | INTRAVENOUS | Status: DC | PRN
  Administered 2016-05-04: 2 mg via INTRAVENOUS
  Filled 2016-05-04: qty 1

## 2016-05-04 MED ORDER — HYDROCHLOROTHIAZIDE 25 MG PO TABS
25.0000 mg | ORAL_TABLET | Freq: Every day | ORAL | Status: DC
  Administered 2016-05-05 – 2016-05-06 (×2): 25 mg via ORAL
  Filled 2016-05-04 (×2): qty 1

## 2016-05-04 MED ORDER — LOSARTAN POTASSIUM 50 MG PO TABS
100.0000 mg | ORAL_TABLET | Freq: Every day | ORAL | Status: DC
  Administered 2016-05-04 – 2016-05-06 (×3): 100 mg via ORAL
  Filled 2016-05-04 (×3): qty 2

## 2016-05-04 NOTE — Progress Notes (Signed)
Pharmacy Antibiotic Note  Jason AnisJohn C [name] is a 65 y.o. male s/p [name] and noted with cellulitis. Pharmacy has been consulted for vancomycin dosing, also on imipenem per MD. No hx seizures noted. -SCr= 1.13 CrCl ~ 90, wbc wnl, no cx. -vancomycin trough= 19 (goal 10-15) on 7/7  Plan: -Continue Vancomycin 1000mg  IV q12h -Imipenem 500mg  IV q6h per MD -Monitor clinical progress, renal function, abx plan/LOT   Height: 6\' 4"  (193 cm) Weight: 276 lb 3.2 oz (125.283 kg) IBW/kg (Calculated) : 86.8  Temp (24hrs), Avg:98.1 F (36.7 C), Min:97.8 F (36.6 C), Max:98.3 F (36.8 C)   Recent Labs Lab 04/29/16 0429 05/01/16 0433 05/01/16 1005 05/03/16 0515  WBC 7.4  --   --  7.6  CREATININE 1.14 1.09  --  1.13  VANCOTROUGH  --   --  19  --     Estimated Creatinine Clearance: 93 mL/min (by C-G formula based on Cr of 1.13).    Allergies  Allergen Reactions  . Aspirin Anaphylaxis  . Inderal [Propranolol]     Other reaction(s): Other (See Comments) Fatigue  . Lovastatin     Other reaction(s): Muscle Pain  . Metformin Hcl Diarrhea  . Penicillin G Hives  . Pravastatin Sodium     Other reaction(s): Muscle Pain  . Simvastatin     Other reaction(s): Muscle Pain  . Penicillins Rash    Has patient had a PCN reaction causing immediate rash, facial/tongue/throat swelling, SOB or lightheadedness with hypotension: Yes Has patient had a PCN reaction causing severe rash involving mucus membranes or skin necrosis: Yes Has patient had a PCN reaction that required hospitalization No Has patient had a PCN reaction occurring within the last 10 years: Yes If all of the above answers are "NO", then may proceed with Cephalosporin use.     Antimicrobials this admission: 7/1 azithromycin>> 7/3 7/3 vancomycin>> 7/6 imipenem>>  Microbiology results: none  Harland Germanndrew Sala Tague, Pharm D 05/04/2016 9:50 AM

## 2016-05-04 NOTE — Progress Notes (Addendum)
[address] [name] [address]       [place] [id number]             [phone]      20 Days Post-Op Procedure(s) (LRB): CORONARY ARTERY BYPASS GRAFTING TIMES FOUR    USING LEFT INTERNAL MAMMARY AND ENDOSCOPIC HARVEST RIGHT SAPHENOUS VEIN (N/A) INTRAOPERATIVE TRANSESOPHAGEAL ECHOCARDIOGRAM (N/A) Subjective: Feels better  Objective: Vital signs in last 24 hours: Temp:  [97.8 F (36.6 C)-98.3 F (36.8 C)] 98.3 F (36.8 C) (07/10 0524) Pulse Rate:  [95-104] 102 (07/10 0524) Cardiac Rhythm:  [-] Sinus tachycardia;Bundle branch block (07/09 1900) Resp:  [16-18] 16 (07/10 0524) BP: (100-123)/(56-64) 106/64 mmHg (07/10 0524) SpO2:  [94 %-98 %] 94 % (07/10 0524) Weight:  [276 lb 3.2 oz (125.283 kg)] 276 lb 3.2 oz (125.283 kg) (07/10 0524)  Hemodynamic parameters for last 24 hours:    Intake/Output from previous day: 07/09 0701 - 07/10 0700 In: 948 [P.O.:948] Out: 2700 [Urine:2700] Intake/Output this shift:    General appearance: alert, cooperative and no distress Heart: regular rate and rhythm Lungs: clear to auscultation bilaterally Abdomen: benign Extremities: edema conts to improve Wound: cellulitis is improved  Lab Results:  Recent Labs  05/03/16 0515  WBC 7.6  HGB 8.9*  HCT 28.7*  PLT 213   BMET:  Recent Labs  05/03/16 0515  NA 133*  K 3.8  CL 99*  CO2 29  GLUCOSE 78  BUN 12  CREATININE 1.13  CALCIUM 8.8*    PT/INR: No results for input(s): LABPROT, INR in the last 72 hours. ABG    Component Value Date/Time   PHART 7.328* 04/14/2016 2006   HCO3 25.0* 04/14/2016 2006   TCO2 26 04/15/2016 1707   ACIDBASEDEF 1.0 04/14/2016 2006   O2SAT 91.0 04/14/2016 2006   CBG (last 3)   Recent Labs  05/03/16 2129 05/04/16 0621 05/04/16 0658  GLUCAP 220* 69 69    Meds Scheduled Meds: . amiodarone  200 mg Oral Daily  . atorvastatin  80 mg Oral q1800  . bisacodyl  10 mg Oral Daily   Or  . bisacodyl  10 mg Rectal Daily  . carvedilol  3.125 mg  Oral BID WC  . clopidogrel  75 mg Oral Daily  . enoxaparin (LOVENOX) injection  40 mg Subcutaneous Q24H  . furosemide  40 mg Oral Daily  . gemfibrozil  600 mg Oral BID AC  . glimepiride  4 mg Oral BID AC  . imipenem-cilastatin  500 mg Intravenous Q6H  . insulin aspart  0-24 Units Subcutaneous TID WC & HS  . metFORMIN  750 mg Oral Q breakfast  . potassium chloride  20 mEq Oral BID  . sodium chloride flush  3 mL Intravenous Q12H  . vancomycin  1,000 mg Intravenous Q12H   Continuous Infusions:  PRN Meds:.acetaminophen, acetaminophen, antiseptic oral rinse, gi cocktail, LORazepam, metoprolol, ondansetron (ZOFRAN) IV, oxyCODONE, phenol, sodium chloride flush, sodium chloride flush, traMADol  Xrays No results found.  Assessment/Plan: S/P Procedure(s) (LRB): CORONARY ARTERY BYPASS GRAFTING TIMES FOUR    USING LEFT INTERNAL MAMMARY AND ENDOSCOPIC HARVEST RIGHT SAPHENOUS VEIN (N/A) INTRAOPERATIVE TRANSESOPHAGEAL ECHOCARDIOGRAM (N/A)  1 conts current management for cellulitis- clinically improved  2 sugars variable, will increase metformin  3 hemodyn stable- some bigeminy, cont beta blocker, K+ replacement  LOS: 21 days    Jason [name],Jason [name] 05/04/2016  I have seen and examined the patient and agree with the assessment and plan as outlined.  RLE incision needs to  be opened to evacuate more of hematoma that is spontaneously draining.  Possibly ready for d/c home in 1-2 days if leg improved.  May need home health nursing for wound care. Patient was on Invokana PTA - will restart and keep metformin at pre-op dose.  He reports intolerance of increased doses of metformin in the past.  Increase carvedilol and restart Hyzaar.    Purcell Nails, MD 05/04/2016 8:20 AM

## 2016-05-04 NOTE — Care Management Important Message (Signed)
Important Message  Patient Details  Name: Jason [name] MRN: [id number] Date of Birth: 02/24/1951   Medicare Important Message Given:  Yes    Bernadette HoitShoffner, Ridhima Golberg Coleman 05/04/2016, 8:18 AM

## 2016-05-04 NOTE — Progress Notes (Signed)
Utilization review completed.  

## 2016-05-04 NOTE — Progress Notes (Signed)
Inpatient Diabetes Program Recommendations  AACE/ADA: New Consensus Statement on Inpatient Glycemic Control (2015)  Target Ranges:  Prepandial:   less than 140 mg/dL      Peak postprandial:   less than 180 mg/dL (1-2 hours)      Critically ill patients:  140 - 180 mg/dL   Lab Results  Component Value Date   GLUCAP 69 05/04/2016   HGBA1C 7.6* 04/13/2016    Review of Glycemic Control:  Results for Jason [name], Jason [name] (MRN [id number]) as of 05/04/2016 11:06  Ref. Range 05/03/2016 11:38 05/03/2016 16:32 05/03/2016 21:29 05/04/2016 06:21 05/04/2016 06:58  Glucose-Capillary Latest Ref Range: 65-99 mg/dL 409167 (H) 811242 (H) 914220 (H) 69 69   Please consider decreasing Novolog correction scale to moderate tid with meals and HS Novolog coverage.  Thanks, Beryl MeagerJenny Bern Fare, RN, BC-ADM Inpatient Diabetes Coordinator Pager [phone] (8a-5p)

## 2016-05-04 NOTE — Progress Notes (Signed)
CARDIAC REHAB PHASE I   PRE:  Rate/Rhythm: 99 SR    BP: sitting 116/62    SaO2: 94 RA  MODE:  Ambulation: 730 ft   POST:  Rate/Rhythm: 123 ST    BP: sitting 130/71     SaO2: 94 RA  Pt moving better. C/o rib pain getting up as he still uses his arms. Discussed this with pt and he actually stood without using his arms, hopefully saw that he could. Steady with RW, doing well. Leg feels better. To recliner, pt sat back without using his arms. Family present, wife wanting to be here for education. [phone]   Harriet MassonRandi Kristan Tyreka Henneke CES, ACSM 05/04/2016 9:54 AM

## 2016-05-04 NOTE — Procedures (Signed)
      [address]       [place] [id number]             [phone]      Jason AnisJohn C [name] 04/03/1951 [id number]   Procedure: [facility] Right Lower Extremity  Pre operative Diagnosis: Cellulitis, Hematoma of Right Leg EVH site Post Diagnosis: same  The patients wound was cleaned with betadine and NS.  His previous EVH incision was opened via cutting of previously placed suture.  There was immediate release of old liquid blood.  His leg was expressed with successful removal of old blood clot.  The wound was irrigated with NS and packed with wet kerlex dressing.  A clean and dry dressing was placed over wound.  The patient tolerated the procedure without difficulty.  Blood Loss Minimal

## 2016-05-04 NOTE — Progress Notes (Signed)
Hypoglycemic Event  CBG: 69 at 0621  Treatment: Cheree DittoGraham crackers  Symptoms: Asymptomatic  Follow-up CBG: [id number]:0658 CBG Result: 69 (oncoming RN notified and will continue to monitor, pt given orange juice)  Possible Reasons for Event: unknown  Comments/MD notified: No      Johnson,Evia Goldsmith A  05/04/2016

## 2016-05-05 LAB — GLUCOSE, CAPILLARY
GLUCOSE-CAPILLARY: 118 mg/dL — AB (ref 65–99)
GLUCOSE-CAPILLARY: 237 mg/dL — AB (ref 65–99)
GLUCOSE-CAPILLARY: 147 mg/dL — AB (ref 65–99)
Glucose-Capillary: 72 mg/dL (ref 65–99)

## 2016-05-05 NOTE — Progress Notes (Signed)
CARDIAC REHAB PHASE I   PRE:  Rate/Rhythm: 99 SR    BP: sitting 91/47    SaO2: 96 RA  MODE:  Ambulation: 700 ft   POST:  Rate/Rhythm: 117 ST    BP: sitting 109/56     SaO2: 96 RA  Doing well, able to stand with min assist with gait belt. Walked with RW, no assist needed except with IV pole. Only walked x1 yesterday, encouraged walking with staff or family today. Sts he has RW at home. Will ed in am with wife. [phone]   Harriet MassonRandi Kristan Lettie Czarnecki CES, ACSM 05/05/2016 11:02 AM

## 2016-05-05 NOTE — Progress Notes (Addendum)
[address]       [place] [id number]             [phone]      21 Days Post-Op Procedure(s) (LRB): CORONARY ARTERY BYPASS GRAFTING TIMES FOUR    USING LEFT INTERNAL MAMMARY AND ENDOSCOPIC HARVEST RIGHT SAPHENOUS VEIN (N/A) INTRAOPERATIVE TRANSESOPHAGEAL ECHOCARDIOGRAM (N/A) Subjective Feeling pretty well  Objective: Vital signs in last 24 hours: Temp:  [89.9 F (32.2 C)-98.2 F (36.8 C)] 89.9 F (32.2 C) (07/11 0429) Pulse Rate:  [95-114] 95 (07/11 0429) Cardiac Rhythm:  [-] Normal sinus rhythm (07/11 0429) Resp:  [18] 18 (07/11 0429) BP: (99-102)/(56-59) 99/59 mmHg (07/11 0429) SpO2:  [95 %] 95 % (07/11 0429) Weight:  [277 lb 6.4 oz (125.828 kg)] 277 lb 6.4 oz (125.828 kg) (07/11 [id number])  Hemodynamic parameters for last 24 hours:    Intake/Output from previous day: 07/10 0701 - 07/11 0700 In: 720 [P.O.:720] Out: 1100 [Urine:1100] Intake/Output this shift:    General appearance: alert, cooperative and no distress Heart: regular rate and rhythm Lungs: sl dim in left base Abdomen: benign Extremities: decreased edema Wound: erethema right leg is less, some old bloody drainage  Lab Results:  Recent Labs  05/03/16 0515  WBC 7.6  HGB 8.9*  HCT 28.7*  PLT 213   BMET:  Recent Labs  05/03/16 0515  NA 133*  K 3.8  CL 99*  CO2 29  GLUCOSE 78  BUN 12  CREATININE 1.13  CALCIUM 8.8*    PT/INR: No results for input(s): LABPROT, INR in the last 72 hours. ABG    Component Value Date/Time   PHART 7.328* 04/14/2016 2006   HCO3 25.0* 04/14/2016 2006   TCO2 26 04/15/2016 1707   ACIDBASEDEF 1.0 04/14/2016 2006   O2SAT 91.0 04/14/2016 2006   CBG (last 3)   Recent Labs  05/04/16 1636 05/04/16 2122 05/05/16 0632  GLUCAP 196* 198* 72    Meds Scheduled Meds: . amiodarone  200 mg Oral Daily  . atorvastatin  80 mg Oral q1800  . bisacodyl  10 mg Oral Daily   Or  . bisacodyl  10 mg Rectal Daily  . canagliflozin  300 mg Oral QAC  breakfast  . carvedilol  6.25 mg Oral BID WC  . clopidogrel  75 mg Oral Daily  . enoxaparin (LOVENOX) injection  40 mg Subcutaneous Q24H  . furosemide  40 mg Oral Daily  . gemfibrozil  600 mg Oral BID AC  . glimepiride  4 mg Oral BID AC  . losartan  100 mg Oral Daily   And  . hydrochlorothiazide  25 mg Oral Daily  . imipenem-cilastatin  500 mg Intravenous Q6H  . insulin aspart  0-24 Units Subcutaneous TID WC & HS  . metFORMIN  750 mg Oral Q breakfast  . potassium chloride  20 mEq Oral BID  . sodium chloride flush  3 mL Intravenous Q12H  . vancomycin  1,000 mg Intravenous Q12H   Continuous Infusions:  PRN Meds:.acetaminophen, acetaminophen, antiseptic oral rinse, gi cocktail, LORazepam, metoprolol, morphine injection, ondansetron (ZOFRAN) IV, oxyCODONE, phenol, sodium chloride flush, sodium chloride flush, traMADol  Xrays No results found.  Assessment/Plan: S/P Procedure(s) (LRB): CORONARY ARTERY BYPASS GRAFTING TIMES FOUR    USING LEFT INTERNAL MAMMARY AND ENDOSCOPIC HARVEST RIGHT SAPHENOUS VEIN (N/A) INTRAOPERATIVE TRANSESOPHAGEAL ECHOCARDIOGRAM (N/A)  1 hemodyn stable in sinus rhythm 2 CBG's fluctuating- monitor on current rx with recent med changes 3 cellulitis improved - cont dressing changes  4 will order [facility] for dressing changes 5 home soon hopefully    LOS: 22 days    GOLD,WAYNE E 05/05/2016  I have seen and examined the patient and agree with the assessment and plan as outlined.  RLE looks MUCH better after evacuation of hematoma.  Wound culture no growth so far.  Will arrange for home health nursing for wound care.  Possible d/c home tomorrow.  Purcell Nails, MD 05/05/2016 8:13 AM

## 2016-05-05 NOTE — Care Management Note (Signed)
Case Management Note Donn PieriniKristi Yassmin Binegar RN, BSN Unit 2W-Case Manager [phone]  Patient Details  Name: Jason AnisJohn C [name] MRN: [id number] Date of Birth: 12/27/1950  Subjective/Objective:  Pt s/p [name] x4  tx from ICU to 2W on 04/16/16                  Action/Plan: PTA pt lived at home- anticipate return home when medically stable  Expected Discharge Date:                  Expected Discharge Plan:  Home w Home Health Services  In-House Referral:     Discharge planning Services  CM Consult  Post Acute Care Choice:  Home Health Choice offered to:  Patient  DME Arranged:    DME Agency:     HH Arranged:  RN HH Agency:     Status of Service:  In process, will continue to follow  If discussed at Long Length of Stay Meetings, dates discussed:    Additional Comments:  05/05/16- 1200- Donn PieriniKristi Lancer Thurner RN, CM- [facility]/drsg changes- spoke with pt at bedside- [facility] [place]- list provided to pt - per pt he would like to review with wife before making choice- CM to follow up with pt in am regarding HH choice for services-    05/04/16- 1100- Clayson Riling RN, CM- pt stay complicated by post op ileus and cellulitis with LE hematoma- pt for bedside evacuation of hematoma today- CM to follow for potential HH needs for wound care.   04/24/16- 1155- Carleigh Buccieri RN, CM- pt with post op ileus- has NGT tube in place- and TNA- also with IV amio - CM will continue to follow for any potential d/c needs  Darrold SpanWebster, Charrise Lardner Hall, RN 05/05/2016, 12:24 PM

## 2016-05-06 LAB — AEROBIC CULTURE W GRAM STAIN (SUPERFICIAL SPECIMEN)
Culture: NO GROWTH
Special Requests: NORMAL

## 2016-05-06 LAB — GLUCOSE, CAPILLARY
GLUCOSE-CAPILLARY: 64 mg/dL — AB (ref 65–99)
Glucose-Capillary: 62 mg/dL — ABNORMAL LOW (ref 65–99)
Glucose-Capillary: 90 mg/dL (ref 65–99)
Glucose-Capillary: 123 mg/dL — ABNORMAL HIGH (ref 65–99)

## 2016-05-06 MED ORDER — CIPROFLOXACIN HCL 500 MG PO TABS: 500.0000 mg | ORAL_TABLET | Freq: Two times a day (BID) | ORAL | Status: DC

## 2016-05-06 MED ORDER — ATORVASTATIN CALCIUM 80 MG PO TABS: 80.0000 mg | ORAL_TABLET | Freq: Every day | ORAL | Status: DC

## 2016-05-06 MED ORDER — FUROSEMIDE 40 MG PO TABS: 40.0000 mg | ORAL_TABLET | Freq: Every day | ORAL | Status: DC

## 2016-05-06 MED ORDER — CLOPIDOGREL BISULFATE 75 MG PO TABS: 75.0000 mg | ORAL_TABLET | Freq: Every day | ORAL | Status: DC

## 2016-05-06 MED ORDER — AMIODARONE HCL 200 MG PO TABS: 200.0000 mg | ORAL_TABLET | Freq: Every day | ORAL | Status: DC

## 2016-05-06 MED ORDER — POTASSIUM CHLORIDE CRYS ER 20 MEQ PO TBCR: 20.0000 meq | EXTENDED_RELEASE_TABLET | Freq: Every day | ORAL | Status: DC

## 2016-05-06 MED ORDER — OXYCODONE HCL 5 MG PO TABS: 5.0000 mg | ORAL_TABLET | Freq: Four times a day (QID) | ORAL | Status: DC | PRN

## 2016-05-06 MED ORDER — LOSARTAN POTASSIUM-HCTZ 100-25 MG PO TABS: 0.5000 | ORAL_TABLET | Freq: Every evening | ORAL | Status: DC

## 2016-05-06 MED ORDER — CARVEDILOL 6.25 MG PO TABS: 6.2500 mg | ORAL_TABLET | Freq: Two times a day (BID) | ORAL | Status: DC

## 2016-05-06 NOTE — Progress Notes (Signed)
CARDIAC REHAB PHASE I   Pt on bed rest post PICC removal. Cardiac surgery discharge education completed with pt and wife at bedside. Reviewed risk factors, tobacco cessation, IS, sternal precautions, activity progression, exercise, heart healthy diet, carb counting, sodium restrictions, and phase 2 cardiac rehab. Pt verbalized understanding. Pt agrees to phase 2 cardiac rehab referral, [facility]. Pt in bed, call bell within reach.  [phone] Joylene GrapesEmily C Clary Meeker, RN, BSN 05/06/2016 10:50 AM

## 2016-05-06 NOTE — Progress Notes (Addendum)
[address]       [place] [id number]             [phone]      22 Days Post-Op Procedure(s) (LRB): CORONARY ARTERY BYPASS GRAFTING TIMES FOUR    USING LEFT INTERNAL MAMMARY AND ENDOSCOPIC HARVEST RIGHT SAPHENOUS VEIN (N/A) INTRAOPERATIVE TRANSESOPHAGEAL ECHOCARDIOGRAM (N/A) Subjective: Cont to improvr , BP a little low with the Hyzaar, sugars a little low at times- patient does check his sugars  at home  Objective: Vital signs in last 24 hours: Temp:  [97.8 F (36.6 C)-98.1 F (36.7 C)] 97.8 F (36.6 C) (07/12 0543) Pulse Rate:  [91-100] 93 (07/12 0543) Cardiac Rhythm:  [-] Normal sinus rhythm;Sinus tachycardia (07/11 1920) Resp:  [20] 20 (07/12 0543) BP: (85-111)/(45-58) 110/58 mmHg (07/12 0543) SpO2:  [94 %-97 %] 94 % (07/12 0543) Weight:  [274 lb 11.2 oz (124.603 kg)] 274 lb 11.2 oz (124.603 kg) (07/12 0543)  Hemodynamic parameters for last 24 hours:    Intake/Output from previous day: 07/11 0701 - 07/12 0700 In: 720 [P.O.:720] Out: 2000 [Urine:2000] Intake/Output this shift:    General appearance: alert, cooperative and no distress Heart: regular rate and rhythm Lungs: clear to auscultation bilaterally Abdomen: benign Extremities: edema conts to improve Wound: cellulitis conts to improve, incis draining old hematoma  Lab Results: No results for input(s): WBC, HGB, HCT, PLT in the last 72 hours. BMET: No results for input(s): NA, K, CL, CO2, GLUCOSE, BUN, CREATININE, CALCIUM in the last 72 hours.  PT/INR: No results for input(s): LABPROT, INR in the last 72 hours. ABG    Component Value Date/Time   PHART 7.328* 04/14/2016 2006   HCO3 25.0* 04/14/2016 2006   TCO2 26 04/15/2016 1707   ACIDBASEDEF 1.0 04/14/2016 2006   O2SAT 91.0 04/14/2016 2006   CBG (last 3)   Recent Labs  05/06/16 0512 05/06/16 0543 05/06/16 0613  GLUCAP 64* 62* 90    Meds Scheduled Meds: . amiodarone  200 mg Oral Daily  . atorvastatin  80 mg Oral  q1800  . bisacodyl  10 mg Oral Daily   Or  . bisacodyl  10 mg Rectal Daily  . canagliflozin  300 mg Oral QAC breakfast  . carvedilol  6.25 mg Oral BID WC  . clopidogrel  75 mg Oral Daily  . enoxaparin (LOVENOX) injection  40 mg Subcutaneous Q24H  . furosemide  40 mg Oral Daily  . gemfibrozil  600 mg Oral BID AC  . glimepiride  4 mg Oral BID AC  . losartan  100 mg Oral Daily   And  . hydrochlorothiazide  25 mg Oral Daily  . imipenem-cilastatin  500 mg Intravenous Q6H  . insulin aspart  0-24 Units Subcutaneous TID WC & HS  . metFORMIN  750 mg Oral Q breakfast  . potassium chloride  20 mEq Oral BID  . sodium chloride flush  3 mL Intravenous Q12H  . vancomycin  1,000 mg Intravenous Q12H   Continuous Infusions:  PRN Meds:.acetaminophen, acetaminophen, antiseptic oral rinse, gi cocktail, LORazepam, metoprolol, morphine injection, ondansetron (ZOFRAN) IV, oxyCODONE, phenol, sodium chloride flush, sodium chloride flush, traMADol  Xrays No results found.  Assessment/Plan: S/P Procedure(s) (LRB): CORONARY ARTERY BYPASS GRAFTING TIMES FOUR    USING LEFT INTERNAL MAMMARY AND ENDOSCOPIC HARVEST RIGHT SAPHENOUS VEIN (N/A) INTRAOPERATIVE TRANSESOPHAGEAL ECHOCARDIOGRAM (N/A)  1 steady progress 2 home on po cipro 3 reduce losartan dose 4 knows to watch sugars carefully at home  LOS: 23 days    GOLD,WAYNE E 05/06/2016  I have seen and examined the patient and agree with the assessment and plan as outlined.  Purcell Nails, MD 05/06/2016 8:34 AM

## 2016-05-06 NOTE — Discharge Instructions (Signed)
Endoscopic Saphenous Vein Harvesting, Care After Refer to this sheet in the next few weeks. These instructions provide you with information on caring for yourself after your procedure. Your health care provider may also give you more specific instructions. Your treatment has been planned according to current medical practices, but problems sometimes occur. Call your health care provider if you have any problems or questions after your procedure. HOME CARE INSTRUCTIONS Medicine  Take whatever pain medicine your surgeon prescribes. Follow the directions carefully. Do not take over-the-counter pain medicine unless your surgeon says it is okay. Some pain medicine can cause bleeding problems for several weeks after surgery.  Follow your surgeon's instructions about driving. You will probably not be permitted to drive after heart surgery.  Take any medicines your surgeon prescribes. Any medicines you took before your heart surgery should be checked with your health care provider before you start taking them again. Wound care  If your surgeon has prescribed an elastic bandage or stocking, ask how long you should wear it.  Check the area around your surgical cuts (incisions) whenever your bandages (dressings) are changed. Look for any redness or swelling.  You will need to return to have the stitches (sutures) or staples taken out. Ask your surgeon when to do that.  Ask your surgeon when you can shower or bathe. Activity  Try to keep your legs raised when you are sitting.  Do any exercises your health care providers have given you. These may include deep breathing exercises, coughing, walking, or other exercises. SEEK MEDICAL CARE IF:  You have any questions about your medicines.  You have more leg pain, especially if your pain medicine stops working.  New or growing bruises develop on your leg.  Your leg swells, feels tight, or becomes red.  You have numbness in your leg. SEEK IMMEDIATE  MEDICAL CARE IF:  Your pain gets much worse.  Blood or fluid leaks from any of the incisions.  Your incisions become warm, swollen, or red.  You have chest pain.  You have trouble breathing.  You have a fever.  You have more pain near your leg incision. MAKE SURE YOU:  Understand these instructions.  Will watch your condition.  Will get help right away if you are not doing well or get worse.   This information is not intended to replace advice given to you by your health care provider. Make sure you discuss any questions you have with your health care provider.   Document Released: 06/24/2011 Document Revised: 11/02/2014 Document Reviewed: 06/24/2011 Elsevier Interactive Patient Education 2016 [employer] Inc[employer] Coronary Artery Bypass Grafting, Care After These instructions give you information on caring for yourself after your procedure. Your doctor may also give you more specific instructions. Call your doctor if you have any problems or questions after your procedure.  HOME CARE  Only take medicine as told by your doctor. Take medicines exactly as told. Do not stop taking medicines or start any new medicines without talking to your doctor first.  Take your pulse as told by your doctor.  Do deep breathing as told by your doctor. Use your breathing device (incentive spirometer), if given, to practice deep breathing several times a day. Support your chest with a pillow or your arms when you take deep breaths or cough.  Keep the area clean, dry, and protected where the surgery cuts (incisions) were made. Remove bandages (dressings) only as told by your doctor. If strips were applied to surgical area, do not take  them off. They fall off on their own.  Check the surgery area daily for puffiness (swelling), redness, or leaking fluid.  If surgery cuts were made in your legs:  Avoid crossing your legs.  Avoid sitting for long periods of time. Change positions every 30  minutes.  Raise your legs when you are sitting. Place them on pillows.  Wear stockings that help keep blood clots from forming in your legs (compression stockings).  Only take sponge baths until your doctor says it is okay to take showers. Pat the surgery area dry. Do not rub the surgery area with a washcloth or towel. Do not bathe, swim, or use a hot tub until your doctor says it is okay.  Eat foods that are high in fiber. These include raw fruits and vegetables, whole grains, beans, and nuts. Choose lean meats. Avoid canned, processed, and fried foods.  Drink enough fluids to keep your pee (urine) clear or pale yellow.  Weigh yourself every day.  Rest and limit activity as told by your doctor. You may be told to:  Stop any activity if you have chest pain, shortness of breath, changes in heartbeat, or dizziness. Get help right away if this happens.  Move around often for short amounts of time or take short walks as told by your doctor. Gradually become more active. You may need help to strengthen your muscles and build endurance.  Avoid lifting, pushing, or pulling anything heavier than 10 pounds (4.5 kg) for at least 6 weeks after surgery.  Do not drive until your doctor says it is okay.  Ask your doctor when you can go back to work.  Ask your doctor when you can begin sexual activity again.  Follow up with your doctor as told. GET HELP IF:  You have puffiness, redness, more pain, or fluid draining from the incision site.  You have a fever.  You have puffiness in your ankles or legs.  You have pain in your legs.  You gain 2 or more pounds (0.9 kg) a day.  You feel sick to your stomach (nauseous) or throw up (vomit).  You have watery poop (diarrhea). GET HELP RIGHT AWAY IF:  You have chest pain that goes to your jaw or arms.  You have shortness of breath.  You have a fast or irregular heartbeat.  You notice a "clicking" in your breastbone when you move.  You  have numbness or weakness in your arms or legs.  You feel dizzy or light-headed. MAKE SURE YOU:  Understand these instructions.  Will watch your condition.  Will get help right away if you are not doing well or get worse.   This information is not intended to replace advice given to you by your health care provider. Make sure you discuss any questions you have with your health care provider.   Document Released: 10/17/2013 Document Reviewed: 10/17/2013 Elsevier Interactive Patient Education 2016 Elsevier Inc. Cellulitis Cellulitis is an infection of the skin and the tissue beneath it. The infected area is usually red and tender. Cellulitis occurs most often in the arms and lower legs.  CAUSES  Cellulitis is caused by bacteria that enter the skin through cracks or cuts in the skin. The most common types of bacteria that cause cellulitis are staphylococci and streptococci. SIGNS AND SYMPTOMS   Redness and warmth.  Swelling.  Tenderness or pain.  Fever. DIAGNOSIS  Your health care provider can usually determine what is wrong based on a physical exam. Blood tests  may also be done. TREATMENT  Treatment usually involves taking an antibiotic medicine. HOME CARE INSTRUCTIONS   Take your antibiotic medicine as directed by your health care provider. Finish the antibiotic even if you start to feel better.  Keep the infected arm or leg elevated to reduce swelling.  Apply a warm cloth to the affected area up to 4 times per day to relieve pain.  Take medicines only as directed by your health care provider.  Keep all follow-up visits as directed by your health care provider. SEEK MEDICAL CARE IF:   You notice red streaks coming from the infected area.  Your red area gets larger or turns dark in color.  Your bone or joint underneath the infected area becomes painful after the skin has healed.  Your infection returns in the same area or another area.  You notice a swollen bump in  the infected area.  You develop new symptoms.  You have a fever. SEEK IMMEDIATE MEDICAL CARE IF:   You feel very sleepy.  You develop vomiting or diarrhea.  You have a general ill feeling (malaise) with muscle aches and pains.   This information is not intended to replace advice given to you by your health care provider. Make sure you discuss any questions you have with your health care provider.   Document Released: 07/22/2005 Document Revised: 07/03/2015 Document Reviewed: 12/28/2011 Elsevier Interactive Patient Education [employer]

## 2016-05-06 NOTE — Progress Notes (Signed)
Discussed with the patient and all questioned fully answered. He will call me if any problems arise. Pt given paper prescription and discharge packet. Educated on medications, follow up appointments, sternal precautions, level of activity, and care progression.  PICC line removed by IV team. Peripheral IV removed. HH RN set up by CM. Telemetry removed, CCMD notified.  Leonidas Rombergaitlin S Bumbledare, RN

## 2016-05-06 NOTE — Progress Notes (Signed)
Hypoglycemic Event  CBG:64  Treatment:2 cups orange juice  Symptoms:none  Follow-up CBG: NWGN:[id number]:0612 CBG Result:90  Possible Reasons for Event:poor food intake(dinner)  Comments/MD notified:none    Jason ReasAguirre, Jason [name] [employer]

## 2016-05-06 NOTE — Care Management Note (Signed)
Case Management Note Donn PieriniKristi Payne Garske RN, BSN Unit 2W-Case Manager [phone]  Patient Details  Name: Jason AnisJohn C [name] MRN: [id number] Date of Birth: 09/23/1951  Subjective/Objective:  Pt s/p [name] x4  tx from ICU to 2W on 04/16/16                  Action/Plan: PTA pt lived at home- anticipate return home when medically stable  Expected Discharge Date:    05/06/16              Expected Discharge Plan:  Home w Home Health Services  In-House Referral:     Discharge planning Services  CM Consult  Post Acute Care Choice:  Home Health Choice offered to:  Patient, Spouse  DME Arranged:    DME Agency:     HH Arranged:  RN HH Agency:  [facility]  Status of Service:  Completed, signed off  If discussed at Long Length of Stay Meetings, dates discussed:    Additional Comments:  05/06/16- 1025- Olanrewaju Osborn RN, CM- f[facility] pt and [facility] and pt [facility]- [facility]an [facility]g [facility]- pt to d/c home with wife today- no further CM needs noted.   05/05/16- 1200- Makeba Delcastillo RN, CM- [facility] for wound care/drsg changes- spoke with pt at bedside- [facility] [place]- list provided to pt - per pt he would like to review with wife before making choice- CM to follow up with pt in am regarding HH choice for services-    05/04/16- 1100- Olliver Boyadjian RN, CM- pt stay complicated by post op ileus and cellulitis with LE hematoma- pt for bedside evacuation of hematoma today- CM to follow for potential HH needs for wound care.   04/24/16- 1155- Harrold Fitchett RN, CM- pt with post op ileus- has NGT tube in place- and TNA- also with IV amio - CM will continue to follow for any potential d/c needs  Zenda AlpersWebster, Lenn SinkKristi Hall, RN 05/06/2016, 10:26 AM

## 2016-05-07 DIAGNOSIS — I10 Essential (primary) hypertension: Secondary | ICD-10-CM | POA: Diagnosis not present

## 2016-05-07 DIAGNOSIS — Z7984 Long term (current) use of oral hypoglycemic drugs: Secondary | ICD-10-CM | POA: Diagnosis not present

## 2016-05-07 DIAGNOSIS — Z48812 Encounter for surgical aftercare following surgery on the circulatory system: Secondary | ICD-10-CM | POA: Diagnosis not present

## 2016-05-07 DIAGNOSIS — M150 Primary generalized (osteo)arthritis: Secondary | ICD-10-CM | POA: Diagnosis not present

## 2016-05-07 DIAGNOSIS — K219 Gastro-esophageal reflux disease without esophagitis: Secondary | ICD-10-CM | POA: Diagnosis not present

## 2016-05-07 DIAGNOSIS — E669 Obesity, unspecified: Secondary | ICD-10-CM | POA: Diagnosis not present

## 2016-05-07 DIAGNOSIS — E782 Mixed hyperlipidemia: Secondary | ICD-10-CM | POA: Diagnosis not present

## 2016-05-07 DIAGNOSIS — Z951 Presence of aortocoronary bypass graft: Secondary | ICD-10-CM | POA: Diagnosis not present

## 2016-05-07 DIAGNOSIS — L03116 Cellulitis of left lower limb: Secondary | ICD-10-CM | POA: Diagnosis not present

## 2016-05-07 DIAGNOSIS — I2510 Atherosclerotic heart disease of native coronary artery without angina pectoris: Secondary | ICD-10-CM | POA: Diagnosis not present

## 2016-05-07 DIAGNOSIS — E119 Type 2 diabetes mellitus without complications: Secondary | ICD-10-CM | POA: Diagnosis not present

## 2016-05-11 DIAGNOSIS — I10 Essential (primary) hypertension: Secondary | ICD-10-CM | POA: Diagnosis not present

## 2016-05-11 DIAGNOSIS — L03116 Cellulitis of left lower limb: Secondary | ICD-10-CM | POA: Diagnosis not present

## 2016-05-11 DIAGNOSIS — M150 Primary generalized (osteo)arthritis: Secondary | ICD-10-CM | POA: Diagnosis not present

## 2016-05-11 DIAGNOSIS — I2510 Atherosclerotic heart disease of native coronary artery without angina pectoris: Secondary | ICD-10-CM | POA: Diagnosis not present

## 2016-05-11 DIAGNOSIS — Z48812 Encounter for surgical aftercare following surgery on the circulatory system: Secondary | ICD-10-CM | POA: Diagnosis not present

## 2016-05-11 DIAGNOSIS — E119 Type 2 diabetes mellitus without complications: Secondary | ICD-10-CM | POA: Diagnosis not present

## 2016-05-14 DIAGNOSIS — M150 Primary generalized (osteo)arthritis: Secondary | ICD-10-CM | POA: Diagnosis not present

## 2016-05-14 DIAGNOSIS — Z48812 Encounter for surgical aftercare following surgery on the circulatory system: Secondary | ICD-10-CM | POA: Diagnosis not present

## 2016-05-14 DIAGNOSIS — I10 Essential (primary) hypertension: Secondary | ICD-10-CM | POA: Diagnosis not present

## 2016-05-14 DIAGNOSIS — L03116 Cellulitis of left lower limb: Secondary | ICD-10-CM | POA: Diagnosis not present

## 2016-05-14 DIAGNOSIS — E119 Type 2 diabetes mellitus without complications: Secondary | ICD-10-CM | POA: Diagnosis not present

## 2016-05-14 DIAGNOSIS — I2510 Atherosclerotic heart disease of native coronary artery without angina pectoris: Secondary | ICD-10-CM | POA: Diagnosis not present

## 2016-05-17 DIAGNOSIS — M150 Primary generalized (osteo)arthritis: Secondary | ICD-10-CM | POA: Diagnosis not present

## 2016-05-17 DIAGNOSIS — L03116 Cellulitis of left lower limb: Secondary | ICD-10-CM | POA: Diagnosis not present

## 2016-05-17 DIAGNOSIS — E119 Type 2 diabetes mellitus without complications: Secondary | ICD-10-CM | POA: Diagnosis not present

## 2016-05-17 DIAGNOSIS — I2510 Atherosclerotic heart disease of native coronary artery without angina pectoris: Secondary | ICD-10-CM | POA: Diagnosis not present

## 2016-05-17 DIAGNOSIS — I10 Essential (primary) hypertension: Secondary | ICD-10-CM | POA: Diagnosis not present

## 2016-05-17 DIAGNOSIS — Z48812 Encounter for surgical aftercare following surgery on the circulatory system: Secondary | ICD-10-CM | POA: Diagnosis not present

## 2016-05-18 DIAGNOSIS — I25708 Atherosclerosis of coronary artery bypass graft(s), unspecified, with other forms of angina pectoris: Secondary | ICD-10-CM | POA: Diagnosis not present

## 2016-05-18 DIAGNOSIS — I10 Essential (primary) hypertension: Secondary | ICD-10-CM | POA: Diagnosis not present

## 2016-05-18 DIAGNOSIS — E782 Mixed hyperlipidemia: Secondary | ICD-10-CM | POA: Diagnosis not present

## 2016-05-20 DIAGNOSIS — L03116 Cellulitis of left lower limb: Secondary | ICD-10-CM | POA: Diagnosis not present

## 2016-05-20 DIAGNOSIS — Z48812 Encounter for surgical aftercare following surgery on the circulatory system: Secondary | ICD-10-CM | POA: Diagnosis not present

## 2016-05-20 DIAGNOSIS — M150 Primary generalized (osteo)arthritis: Secondary | ICD-10-CM | POA: Diagnosis not present

## 2016-05-20 DIAGNOSIS — E119 Type 2 diabetes mellitus without complications: Secondary | ICD-10-CM | POA: Diagnosis not present

## 2016-05-20 DIAGNOSIS — I10 Essential (primary) hypertension: Secondary | ICD-10-CM | POA: Diagnosis not present

## 2016-05-20 DIAGNOSIS — I2510 Atherosclerotic heart disease of native coronary artery without angina pectoris: Secondary | ICD-10-CM | POA: Diagnosis not present

## 2016-05-22 ENCOUNTER — Other Ambulatory Visit: Payer: Self-pay | Admitting: Thoracic Surgery (Cardiothoracic Vascular Surgery)

## 2016-05-22 DIAGNOSIS — I2510 Atherosclerotic heart disease of native coronary artery without angina pectoris: Secondary | ICD-10-CM | POA: Diagnosis not present

## 2016-05-22 DIAGNOSIS — M150 Primary generalized (osteo)arthritis: Secondary | ICD-10-CM | POA: Diagnosis not present

## 2016-05-22 DIAGNOSIS — Z951 Presence of aortocoronary bypass graft: Secondary | ICD-10-CM

## 2016-05-22 DIAGNOSIS — L03116 Cellulitis of left lower limb: Secondary | ICD-10-CM | POA: Diagnosis not present

## 2016-05-22 DIAGNOSIS — E119 Type 2 diabetes mellitus without complications: Secondary | ICD-10-CM | POA: Diagnosis not present

## 2016-05-22 DIAGNOSIS — Z48812 Encounter for surgical aftercare following surgery on the circulatory system: Secondary | ICD-10-CM | POA: Diagnosis not present

## 2016-05-22 DIAGNOSIS — I10 Essential (primary) hypertension: Secondary | ICD-10-CM | POA: Diagnosis not present

## 2016-05-25 ENCOUNTER — Encounter: Payer: Self-pay | Admitting: Thoracic Surgery (Cardiothoracic Vascular Surgery)

## 2016-05-25 ENCOUNTER — Ambulatory Visit
Admission: RE | Admit: 2016-05-25 | Discharge: 2016-05-25 | Disposition: A | Discharge status: Home / Self Care | Payer: Medicare Other | Source: Ambulatory Visit | Attending: Thoracic Surgery (Cardiothoracic Vascular Surgery) | Admitting: Thoracic Surgery (Cardiothoracic Vascular Surgery)

## 2016-05-25 ENCOUNTER — Ambulatory Visit (INDEPENDENT_AMBULATORY_CARE_PROVIDER_SITE_OTHER): Payer: Self-pay | Admitting: Thoracic Surgery (Cardiothoracic Vascular Surgery)

## 2016-05-25 VITALS — BP 98/66 | HR 96 | Resp 20 | Ht 76.0 in | Wt 270.0 lb

## 2016-05-25 DIAGNOSIS — Z951 Presence of aortocoronary bypass graft: Secondary | ICD-10-CM

## 2016-05-25 DIAGNOSIS — J90 Pleural effusion, not elsewhere classified: Secondary | ICD-10-CM | POA: Diagnosis not present

## 2016-05-25 MED ORDER — AMIODARONE HCL 200 MG PO TABS: 100.0000 mg | ORAL_TABLET | Freq: Every day | ORAL | 1 refills | Status: DC

## 2016-05-25 NOTE — Patient Instructions (Addendum)
Continue to avoid any heavy lifting or strenuous use of your arms or shoulders for at least a total of three months from the time of surgery.  After three months you may gradually increase how much you lift or otherwise use your arms or chest as tolerated, with limits based upon whether or not activities lead to the return of significant discomfort.  You may return to driving an automobile as long as you are no longer requiring oral narcotic pain relievers during the daytime.  It would be wise to start driving only short distances during the daylight and gradually increase from there as you feel comfortable.  You are encouraged to enroll and participate in the outpatient cardiac rehab program beginning as soon as practical once you have completed home health PT  Decrease your dose of Amiodarone to 100 mg (1/2 tablet) daily until your current prescription runs out, then stop taking it altogether.

## 2016-05-25 NOTE — Progress Notes (Addendum)
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 The patient's early postoperative recovery was notable for prolonged postoperative ileus. He had transient episodes of paroxysmal atrial fibrillation which converted to sinus rhythm using amiodarone. He also developed cellulitis related to hematoma in his right thigh and right lower leg endoscopic vein harvest wound that ultimately required opening his wound for evacuation of the hematoma.  He eventually was discharged home on 05/06/2016. Since then he has been seen in follow-up by Dr. Gwen Pounds.  He returns to our office for routine follow-up. He states that since hospital discharge she has continued to make slow but gradual improvement. He has mild residual soreness in his chest. Home health physical therapy has stopped by for a total of 3 visits. He has been ambulating fairly well. Home health nursing care has been checking on wound care for his right lower leg wound. His wife has been dressing the wound daily. They have not had any significant problems.   Current Outpatient Prescriptions  Medication Sig Dispense Refill  . amiodarone (PACERONE) 200 MG tablet Take 1 tablet (200 mg total) by mouth daily. 30 tablet 1  . atorvastatin (LIPITOR) 80 MG tablet Take 1 tablet (80 mg total) by mouth daily at 6 PM. 30 tablet 1  . canagliflozin (INVOKANA) 300 MG TABS tablet Take 300 mg by mouth daily before breakfast. Reported on 04/13/2016    . carvedilol (COREG) 6.25 MG tablet Take 1 tablet (6.25 mg total) by mouth 2 (two) times daily  with a meal. 60 tablet 1  . clopidogrel (PLAVIX) 75 MG tablet Take 1 tablet (75 mg total) by mouth daily. 30 tablet 1  . gemfibrozil (LOPID) 600 MG tablet Take 600 mg by mouth 2 (two) times daily before a meal.    . glimepiride (AMARYL) 4 MG tablet Take 4 mg by mouth 2 (two) times daily. Reported on 04/13/2016    . losartan-hydrochlorothiazide (HYZAAR) 100-25 MG tablet Take 0.5 tablets by mouth every evening. 30 tablet 1  . metFORMIN (GLUCOPHAGE-XR) 750 MG 24 hr tablet Take 750 mg by mouth daily with breakfast. Reported on 04/13/2016    . oxyCODONE (OXY IR/ROXICODONE) 5 MG immediate release tablet Take 1 tablet (5 mg total) by mouth every 6 (six) hours as needed for severe pain. 30 tablet 0  . potassium chloride SA (K-DUR,KLOR-CON) 20 MEQ tablet Take 1 tablet (20 mEq total) by mouth daily. 14 tablet 0   No current facility-administered medications for this visit.       Physical Exam:   BP 98/66 (BP Location: Right Arm, Patient Position: Sitting, Cuff Size: Normal)   Pulse 96   Resp 20   Ht  (1.93 m)   Wt 270 lb (122.5 kg)   SpO2 97% Comment: RA  BMI 32.87 kg/m   General:  Well-appearing  Chest:   Clear to auscultation  CV:   Regular rate and rhythm without murmur  Incisions:  HealinJacky Kindlesternum is stable.  Small open wound right lower  leg is clean and granulating in slowly. There is no significant cellulitis.  Abdomen:  Soft and nontender  Extremities:  Warm and well-perfused with mild lower extremity edema  Diagnostic Tests:  CHEST  2 VIEW COMPARISON:  04/23/2016 FINDINGS: Improved aeration in the lungs. Mild residual left lower lobe atelectasis has improved. Right lung clear. Negative for pulmonary edema.  Small left effusion. IMPRESSION: Improved aeration in the lung bases. Mild left lower lobe atelectasis and small left pleural effusion Electronically Signed   By: Marlan Palau M.D.   On: 05/25/2016 10:51   Impression:  Patient is clinically making  satisfactory progress nearly 6 weeks status post coronary artery bypass grafting. The small open wound on his right lower leg from endoscopic vein harvest is clean and slowly granulating. His wife is performing daily dressing changes without significant problems. Clinically the patient otherwise is making satisfactory progress.  Plan:  I have encouraged the patient to continue to gradually increase his physical activity as tolerated with his primary limitation remaining that he refrain from heavy lifting or strenuous use of his arms or shoulders for at least another 2 months. The patient's wife will continue to change his right leg wound dressing on a daily basis without changes to the regimen at this time. The patient has been reminded to keep his legs propped up as much as possible when he has not been ambulating. I have encouraged him to attempt to walk more and eventually he would be a good candidate for participation in the outpatient cardiac rehabilitation program. I have instructed the patient to decrease his dose of amiodarone to 100 mg daily. I would not plan to refill his prescription for amiodarone once his current prescription runs out. We have not recommended any changes to the patient's current medications at this time other than the fact that the patient has been reminded not to take potassium now that he is no longer taking Lasix. The patient will return in 6 weeks for wound check.    Salvatore Decent. Cornelius Moras, MD 05/25/2016 11:07 AM

## 2016-05-26 DIAGNOSIS — E119 Type 2 diabetes mellitus without complications: Secondary | ICD-10-CM | POA: Diagnosis not present

## 2016-05-26 DIAGNOSIS — L03116 Cellulitis of left lower limb: Secondary | ICD-10-CM | POA: Diagnosis not present

## 2016-05-26 DIAGNOSIS — Z48812 Encounter for surgical aftercare following surgery on the circulatory system: Secondary | ICD-10-CM | POA: Diagnosis not present

## 2016-05-26 DIAGNOSIS — M150 Primary generalized (osteo)arthritis: Secondary | ICD-10-CM | POA: Diagnosis not present

## 2016-05-26 DIAGNOSIS — I10 Essential (primary) hypertension: Secondary | ICD-10-CM | POA: Diagnosis not present

## 2016-05-26 DIAGNOSIS — I2510 Atherosclerotic heart disease of native coronary artery without angina pectoris: Secondary | ICD-10-CM | POA: Diagnosis not present

## 2016-05-27 DIAGNOSIS — M150 Primary generalized (osteo)arthritis: Secondary | ICD-10-CM | POA: Diagnosis not present

## 2016-05-27 DIAGNOSIS — I2510 Atherosclerotic heart disease of native coronary artery without angina pectoris: Secondary | ICD-10-CM | POA: Diagnosis not present

## 2016-05-27 DIAGNOSIS — I10 Essential (primary) hypertension: Secondary | ICD-10-CM | POA: Diagnosis not present

## 2016-05-27 DIAGNOSIS — E119 Type 2 diabetes mellitus without complications: Secondary | ICD-10-CM | POA: Diagnosis not present

## 2016-05-27 DIAGNOSIS — Z48812 Encounter for surgical aftercare following surgery on the circulatory system: Secondary | ICD-10-CM | POA: Diagnosis not present

## 2016-05-27 DIAGNOSIS — L03116 Cellulitis of left lower limb: Secondary | ICD-10-CM | POA: Diagnosis not present

## 2016-05-29 DIAGNOSIS — I10 Essential (primary) hypertension: Secondary | ICD-10-CM | POA: Diagnosis not present

## 2016-05-29 DIAGNOSIS — I2510 Atherosclerotic heart disease of native coronary artery without angina pectoris: Secondary | ICD-10-CM | POA: Diagnosis not present

## 2016-05-29 DIAGNOSIS — M150 Primary generalized (osteo)arthritis: Secondary | ICD-10-CM | POA: Diagnosis not present

## 2016-05-29 DIAGNOSIS — L03116 Cellulitis of left lower limb: Secondary | ICD-10-CM | POA: Diagnosis not present

## 2016-05-29 DIAGNOSIS — E119 Type 2 diabetes mellitus without complications: Secondary | ICD-10-CM | POA: Diagnosis not present

## 2016-05-29 DIAGNOSIS — Z48812 Encounter for surgical aftercare following surgery on the circulatory system: Secondary | ICD-10-CM | POA: Diagnosis not present

## 2016-06-02 DIAGNOSIS — I2510 Atherosclerotic heart disease of native coronary artery without angina pectoris: Secondary | ICD-10-CM | POA: Diagnosis not present

## 2016-06-02 DIAGNOSIS — I10 Essential (primary) hypertension: Secondary | ICD-10-CM | POA: Diagnosis not present

## 2016-06-02 DIAGNOSIS — M150 Primary generalized (osteo)arthritis: Secondary | ICD-10-CM | POA: Diagnosis not present

## 2016-06-02 DIAGNOSIS — Z48812 Encounter for surgical aftercare following surgery on the circulatory system: Secondary | ICD-10-CM | POA: Diagnosis not present

## 2016-06-02 DIAGNOSIS — L03116 Cellulitis of left lower limb: Secondary | ICD-10-CM | POA: Diagnosis not present

## 2016-06-02 DIAGNOSIS — E119 Type 2 diabetes mellitus without complications: Secondary | ICD-10-CM | POA: Diagnosis not present

## 2016-06-04 DIAGNOSIS — I2510 Atherosclerotic heart disease of native coronary artery without angina pectoris: Secondary | ICD-10-CM | POA: Diagnosis not present

## 2016-06-04 DIAGNOSIS — I10 Essential (primary) hypertension: Secondary | ICD-10-CM | POA: Diagnosis not present

## 2016-06-04 DIAGNOSIS — L03116 Cellulitis of left lower limb: Secondary | ICD-10-CM | POA: Diagnosis not present

## 2016-06-04 DIAGNOSIS — Z48812 Encounter for surgical aftercare following surgery on the circulatory system: Secondary | ICD-10-CM | POA: Diagnosis not present

## 2016-06-04 DIAGNOSIS — M150 Primary generalized (osteo)arthritis: Secondary | ICD-10-CM | POA: Diagnosis not present

## 2016-06-04 DIAGNOSIS — E119 Type 2 diabetes mellitus without complications: Secondary | ICD-10-CM | POA: Diagnosis not present

## 2016-06-05 ENCOUNTER — Encounter: Payer: Self-pay | Admitting: *Deleted

## 2016-06-09 ENCOUNTER — Encounter (HOSPITAL_COMMUNITY): Payer: Self-pay | Admitting: *Deleted

## 2016-06-09 DIAGNOSIS — L03116 Cellulitis of left lower limb: Secondary | ICD-10-CM | POA: Diagnosis not present

## 2016-06-09 DIAGNOSIS — E119 Type 2 diabetes mellitus without complications: Secondary | ICD-10-CM | POA: Diagnosis not present

## 2016-06-09 DIAGNOSIS — I10 Essential (primary) hypertension: Secondary | ICD-10-CM | POA: Diagnosis not present

## 2016-06-09 DIAGNOSIS — M150 Primary generalized (osteo)arthritis: Secondary | ICD-10-CM | POA: Diagnosis not present

## 2016-06-09 DIAGNOSIS — Z48812 Encounter for surgical aftercare following surgery on the circulatory system: Secondary | ICD-10-CM | POA: Diagnosis not present

## 2016-06-09 DIAGNOSIS — I2510 Atherosclerotic heart disease of native coronary artery without angina pectoris: Secondary | ICD-10-CM | POA: Diagnosis not present

## 2016-06-16 DIAGNOSIS — L03116 Cellulitis of left lower limb: Secondary | ICD-10-CM | POA: Diagnosis not present

## 2016-06-16 DIAGNOSIS — Z48812 Encounter for surgical aftercare following surgery on the circulatory system: Secondary | ICD-10-CM | POA: Diagnosis not present

## 2016-06-16 DIAGNOSIS — I2510 Atherosclerotic heart disease of native coronary artery without angina pectoris: Secondary | ICD-10-CM | POA: Diagnosis not present

## 2016-06-16 DIAGNOSIS — I10 Essential (primary) hypertension: Secondary | ICD-10-CM | POA: Diagnosis not present

## 2016-06-16 DIAGNOSIS — E119 Type 2 diabetes mellitus without complications: Secondary | ICD-10-CM | POA: Diagnosis not present

## 2016-06-16 DIAGNOSIS — M150 Primary generalized (osteo)arthritis: Secondary | ICD-10-CM | POA: Diagnosis not present

## 2016-06-17 DIAGNOSIS — I25810 Atherosclerosis of coronary artery bypass graft(s) without angina pectoris: Secondary | ICD-10-CM | POA: Diagnosis not present

## 2016-06-17 DIAGNOSIS — I10 Essential (primary) hypertension: Secondary | ICD-10-CM | POA: Diagnosis not present

## 2016-06-17 DIAGNOSIS — E782 Mixed hyperlipidemia: Secondary | ICD-10-CM | POA: Diagnosis not present

## 2016-06-23 DIAGNOSIS — E119 Type 2 diabetes mellitus without complications: Secondary | ICD-10-CM | POA: Diagnosis not present

## 2016-06-23 DIAGNOSIS — Z48812 Encounter for surgical aftercare following surgery on the circulatory system: Secondary | ICD-10-CM | POA: Diagnosis not present

## 2016-06-23 DIAGNOSIS — I2510 Atherosclerotic heart disease of native coronary artery without angina pectoris: Secondary | ICD-10-CM | POA: Diagnosis not present

## 2016-06-23 DIAGNOSIS — I10 Essential (primary) hypertension: Secondary | ICD-10-CM | POA: Diagnosis not present

## 2016-06-23 DIAGNOSIS — M150 Primary generalized (osteo)arthritis: Secondary | ICD-10-CM | POA: Diagnosis not present

## 2016-06-23 DIAGNOSIS — L03116 Cellulitis of left lower limb: Secondary | ICD-10-CM | POA: Diagnosis not present

## 2016-07-01 DIAGNOSIS — M150 Primary generalized (osteo)arthritis: Secondary | ICD-10-CM | POA: Diagnosis not present

## 2016-07-01 DIAGNOSIS — I2510 Atherosclerotic heart disease of native coronary artery without angina pectoris: Secondary | ICD-10-CM | POA: Diagnosis not present

## 2016-07-01 DIAGNOSIS — E119 Type 2 diabetes mellitus without complications: Secondary | ICD-10-CM | POA: Diagnosis not present

## 2016-07-01 DIAGNOSIS — Z48812 Encounter for surgical aftercare following surgery on the circulatory system: Secondary | ICD-10-CM | POA: Diagnosis not present

## 2016-07-01 DIAGNOSIS — I10 Essential (primary) hypertension: Secondary | ICD-10-CM | POA: Diagnosis not present

## 2016-07-01 DIAGNOSIS — L03116 Cellulitis of left lower limb: Secondary | ICD-10-CM | POA: Diagnosis not present

## 2016-07-06 ENCOUNTER — Ambulatory Visit (INDEPENDENT_AMBULATORY_CARE_PROVIDER_SITE_OTHER): Payer: Self-pay | Admitting: Physician Assistant

## 2016-07-06 VITALS — BP 107/69 | HR 95 | Resp 16 | Wt 270.0 lb

## 2016-07-06 DIAGNOSIS — Z951 Presence of aortocoronary bypass graft: Secondary | ICD-10-CM

## 2016-07-06 MED ORDER — ATORVASTATIN CALCIUM 80 MG PO TABS: 80.0000 mg | ORAL_TABLET | Freq: Every day | ORAL | 1 refills | Status: DC

## 2016-07-06 MED ORDER — CLOPIDOGREL BISULFATE 75 MG PO TABS: 75.0000 mg | ORAL_TABLET | Freq: Every day | ORAL | 1 refills | Status: DC

## 2016-07-06 NOTE — Progress Notes (Signed)
  HPI:  Patient returns for routine postoperative follow-up having undergone a [name] on 04/14/2016 by Dr. Cornelius Moraswen The patient's early postoperative recovery while in the hospital was notable for a post operative ileus, atrial fibrillation, and a hematoma and cellulitis of the right thigh and lower extremity EVH wound. He underwent incision and drainage at the bedside on 05/04/2016. Daily wound care was then undertaken. He was last seen in the office on 05/25/2016 by Dr. Cornelius Moraswen. The right lower leg wound was granulating and continuing to heal.  Since hospital discharge the patient reports he has " a head cold or allergies". He denies fever or chills.   Current Outpatient Prescriptions  Medication Sig Dispense Refill  . atorvastatin (LIPITOR) 80 MG tablet Take 1 tablet (80 mg total) by mouth daily at 6 PM. 30 tablet 1  . canagliflozin (INVOKANA) 300 MG TABS tablet Take 300 mg by mouth daily before breakfast. Reported on 04/13/2016    . carvedilol (COREG) 6.25 MG tablet Take 1 tablet (6.25 mg total) by mouth 2 (two) times daily with a meal. 60 tablet 1  . clopidogrel (PLAVIX) 75 MG tablet Take 1 tablet (75 mg total) by mouth daily. 30 tablet 1  . gemfibrozil (LOPID) 600 MG tablet Take 600 mg by mouth 2 (two) times daily before a meal.    . glimepiride (AMARYL) 4 MG tablet Take 4 mg by mouth 2 (two) times daily. Reported on 04/13/2016    . losartan-hydrochlorothiazide (HYZAAR) 100-25 MG tablet Take 0.5 tablets by mouth every evening. 30 tablet 1  . metFORMIN (GLUCOPHAGE-XR) 750 MG 24 hr tablet Take 750 mg by mouth daily with breakfast. Reported on 04/13/2016    . oxyCODONE (OXY IR/ROXICODONE) 5 MG immediate release tablet Take 1 tablet (5 mg total) by mouth every 6 (six) hours as needed for severe pain. 30 tablet 0  Vital Signs: BP 107/69, HR 95, RR 16, Oxygen saturation 97% on room air  Physical Exam: CV-RRR Pulmonary-Clean Extremities-Minor bloody ooze after small wound probed Wounds- Some firmness  around wound. No frank cellulitis noted. Wound appears to be healing.   Impression and Plan: Overall, [name] continues to do well and his right lower leg wound is healing well. His wife was instructed to continue packing and dressing wound daily. He continues to maintain SR and has already stopped taking his Amiodarone as he was instructed. He needs refills for both Atorvastatin 80 mg at dinner and Plavix 75 mg daily which he was given. Dr. Cornelius Moraswen viewed the wound and agreed it is healing well. Patient has a follow up appointment with Dr. Gwen PoundsKowalski 08/19/2016. He will return to see Dr. Cornelius Moraswen in 4-6 weeks for another wound check.     Ardelle BallsZIMMERMAN,Yu Cragun M, PA-C [facility] [phone]

## 2016-08-17 ENCOUNTER — Ambulatory Visit (INDEPENDENT_AMBULATORY_CARE_PROVIDER_SITE_OTHER): Payer: Medicare Other | Admitting: Thoracic Surgery (Cardiothoracic Vascular Surgery)

## 2016-08-17 ENCOUNTER — Encounter: Payer: Self-pay | Admitting: Thoracic Surgery (Cardiothoracic Vascular Surgery)

## 2016-08-17 VITALS — BP 114/69 | HR 96 | Resp 20 | Ht 76.0 in | Wt 240.0 lb

## 2016-08-17 DIAGNOSIS — Z951 Presence of aortocoronary bypass graft: Secondary | ICD-10-CM

## 2016-08-17 NOTE — Patient Instructions (Signed)
You may resume unrestricted physical activity without any particular limitations at this time.  You are encouraged to enroll and participate in the outpatient cardiac rehab program beginning as soon as practical.  Continue all previous medications without any changes at this time  Make every effort to keep your diabetes under very tight control.  Follow up closely with your primary care physician or endocrinologist and strive to keep their hemoglobin A1c levels as low as possible, preferably near or below 6.0.  The long term benefits of strict control of diabetes are far reaching and critically important for your overall health and survival.  Make every effort to stay physically active, get some type of exercise on a regular basis, and stick to a "heart healthy diet".  The long term benefits for regular exercise and a healthy diet are critically important to your overall health and wellbeing.

## 2016-08-17 NOTE — Progress Notes (Signed)
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 He developed cellulitis related to a hematoma in his right thigh and right lower leg from endoscopic vein harvest which ultimately required surgical evacuation of the hematoma. Small open wound was treated with local wound care and the patient recovered uneventfully.  He was last seen in our office on 07/06/2016 at which time he was making good progress. He reports that since that time he has done very well. He has still not gotten started in the outpatient cardiac rehabilitation program. However, despite this he has been fairly active at home and getting around quite well. He states that his diabetes has been under good control. He has mild residual soreness in his chest. He has no exertional chest pain or shortness of breath. He is scheduled to see Dr. Gwen Pounds later this week for routine follow-up.   Current Outpatient Prescriptions  Medication Sig Dispense Refill  . atorvastatin (LIPITOR) 80 MG tablet Take 1 tablet (80 mg total) by mouth daily at 6 PM. 30 tablet 1  . canagliflozin (INVOKANA) 300 MG TABS tablet Take 300 mg by mouth daily before breakfast. Reported on 04/13/2016    . carvedilol (COREG) 6.25 MG tablet Take 1 tablet (6.25 mg total) by mouth 2 (two) times daily with a meal. 60 tablet 1  . clopidogrel (PLAVIX) 75 MG tablet Take 1 tablet (75 mg total) by mouth daily. 30 tablet 1  . gemfibrozil (LOPID) 600 MG tablet Take 600 mg by mouth 2 (two) times daily before a meal.    . glimepiride (AMARYL) 4 MG tablet Take 4 mg by  mouth 2 (two) times daily. Reported on 04/13/2016    . losartan-hydrochlorothiazide (HYZAAR) 100-25 MG tablet Take 0.5 tablets by mouth every evening. 30 tablet 1  . metFORMIN (GLUCOPHAGE-XR) 750 MG 24 hr tablet Take 750 mg by mouth daily with breakfast. Reported on 04/13/2016     No current facility-administered medications for this visit.       Physical Exam:   BP 114/69 (BP Location: Right Arm, Patient Position: Sitting, Cuff Size: Normal)   Pulse 96   Resp 20   Ht 6\' 4"  (1.93 m)   Wt 240 lb (108.9 kg)   SpO2 97% Comment: RA  BMI 29.21 kg/m   General:  Well-appearing  Chest:   Clear to auscultation  CV:   Regular rate and rhythm without murmur  Incisions:  Sternotomy is well-healed and sternum is stable, right lower extremity incisions have healed completely  Abdomen:  Soft nontender  Extremities:  Warm and well-perfused, mild lower extremity edema  Diagnostic Tests:  n/a   Impression:  Patient's right lower extremity wound has healed completely. He appears to be doing quite well approximately 4 months status post coronary artery bypass grafting.  Plan:  We have not recommended any changes to the patient's current medications. I've encouraged the patient to continue to increase his physical activity without any particular limitations at this time. I have encouraged him to participate  in the outpatient cardiac rehabilitation program. We have discussed how important it will remain for him to keep his diabetes under good control, to remain physically active, and to follow-up closely with Dr. Gwen PoundsKowalski for long-term management. All of his questions have been addressed.  I spent in excess of 10 minutes during the conduct of this office consultation and >50% of this time involved direct face-to-face encounter with the patient for counseling and/or coordination of their care.    Salvatore Decentlarence H. Cornelius Moraswen, MD 08/17/2016 1:56 PM

## 2016-08-19 DIAGNOSIS — I10 Essential (primary) hypertension: Secondary | ICD-10-CM | POA: Diagnosis not present

## 2016-08-19 DIAGNOSIS — I25810 Atherosclerosis of coronary artery bypass graft(s) without angina pectoris: Secondary | ICD-10-CM | POA: Diagnosis not present

## 2016-08-19 DIAGNOSIS — E782 Mixed hyperlipidemia: Secondary | ICD-10-CM | POA: Diagnosis not present

## 2016-08-19 DIAGNOSIS — R072 Precordial pain: Secondary | ICD-10-CM | POA: Diagnosis not present

## 2016-08-26 DIAGNOSIS — Z961 Presence of intraocular lens: Secondary | ICD-10-CM | POA: Diagnosis not present

## 2016-08-28 DIAGNOSIS — Z720 Tobacco use: Secondary | ICD-10-CM | POA: Diagnosis not present

## 2016-08-28 DIAGNOSIS — I10 Essential (primary) hypertension: Secondary | ICD-10-CM | POA: Diagnosis not present

## 2016-08-28 DIAGNOSIS — F17200 Nicotine dependence, unspecified, uncomplicated: Secondary | ICD-10-CM | POA: Diagnosis not present

## 2016-08-28 DIAGNOSIS — I2510 Atherosclerotic heart disease of native coronary artery without angina pectoris: Secondary | ICD-10-CM | POA: Diagnosis not present

## 2016-08-28 DIAGNOSIS — Z9181 History of falling: Secondary | ICD-10-CM | POA: Diagnosis not present

## 2016-08-28 DIAGNOSIS — E1165 Type 2 diabetes mellitus with hyperglycemia: Secondary | ICD-10-CM | POA: Diagnosis not present

## 2016-08-28 DIAGNOSIS — Z6833 Body mass index (BMI) 33.0-33.9, adult: Secondary | ICD-10-CM | POA: Diagnosis not present

## 2016-08-28 DIAGNOSIS — E782 Mixed hyperlipidemia: Secondary | ICD-10-CM | POA: Diagnosis not present

## 2016-08-28 DIAGNOSIS — Z23 Encounter for immunization: Secondary | ICD-10-CM | POA: Diagnosis not present

## 2016-09-28 ENCOUNTER — Encounter: Payer: Medicare Other | Attending: Thoracic Surgery (Cardiothoracic Vascular Surgery) | Admitting: *Deleted

## 2016-09-28 VITALS — Ht 75.5 in | Wt 282.8 lb

## 2016-09-28 DIAGNOSIS — Z029 Encounter for administrative examinations, unspecified: Secondary | ICD-10-CM | POA: Insufficient documentation

## 2016-09-28 DIAGNOSIS — Z951 Presence of aortocoronary bypass graft: Secondary | ICD-10-CM

## 2016-09-28 NOTE — Patient Instructions (Signed)
Patient Instructions  Patient Details  Name: Jason AnisJohn C Gasser MRN: [id number] Date of Birth: 06/13/1951 Referring Provider:  Purcell Nailswen, Clarence H, MD  Below are the personal goals you chose as well as exercise and nutrition goals. Our goal is to help you keep on track towards obtaining and maintaining your goals. We will be discussing your progress on these goals with you throughout the program.  Initial Exercise Prescription:     Initial Exercise Prescription - 09/28/16 1400      Date of Initial Exercise RX and Referring Provider   Date 09/28/16   Referring Provider Arnoldo HookerKowalski, Bruce MD     Treadmill   MPH 2.2   Grade 0.5   Minutes 15   METs 2.84     NuStep   Level 3   Watts --  80-100 spm   Minutes 15   METs 2     REL-XR   Level 2   Watts --  speed 50   Minutes 15   METs 2     Prescription Details   Frequency (times per week) 3   Duration Progress to 45 minutes of aerobic exercise without signs/symptoms of physical distress     Intensity   THRR 40-80% of Max Heartrate 116-141   Ratings of Perceived Exertion 11-15   Perceived Dyspnea 0-4     Progression   Progression Continue to progress workloads to maintain intensity without signs/symptoms of physical distress.     Resistance Training   Training Prescription Yes   Weight 4 lbs   Reps 10-12      Exercise Goals: Frequency: Be able to perform aerobic exercise three times per week working toward 3-5 days per week.  Intensity: Work with a perceived exertion of 11 (fairly light) - 15 (hard) as tolerated. Follow your new exercise prescription and watch for changes in prescription as you progress with the program. Changes will be reviewed with you when they are made.  Duration: You should be able to do 30 minutes of continuous aerobic exercise in addition to a 5 minute warm-up and a 5 minute cool-down routine.  Nutrition Goals: Your personal nutrition goals will be established when you do your nutrition analysis with  the dietician.  The following are nutrition guidelines to follow: Cholesterol < 200mg /day Sodium < 1500mg /day Fiber: Men over 50 yrs - 30 grams per day  Personal Goals:     Personal Goals and Risk Factors at Admission - 09/28/16 1455      Core Components/Risk Factors/Patient Goals on Admission    Weight Management Obesity;Weight Maintenance;Yes   Intervention Weight Management: Develop a combined nutrition and exercise program designed to reach desired caloric intake, while maintaining appropriate intake of nutrient and fiber, sodium and fats, and appropriate energy expenditure required for the weight goal.   Admit Weight 282 lb 12.8 oz (128.3 kg)   Goal Weight: Short Term 280 lb (127 kg)   Goal Weight: Long Term 235 lb (106.6 kg)   Expected Outcomes Short Term: Continue to assess and modify interventions until short term weight is achieved;Long Term: Adherence to nutrition and physical activity/exercise program aimed toward attainment of established weight goal;Weight Loss: Understanding of general recommendations for a balanced deficit meal plan, which promotes 1-2 lb weight loss per week and includes a negative energy balance of [phone] kcal/d   Sedentary Yes   Intervention Provide advice, education, support and counseling about physical activity/exercise needs.;Develop an individualized exercise prescription for aerobic and resistive training based on initial evaluation findings,  risk stratification, comorbidities and participant's personal goals.   Expected Outcomes Achievement of increased cardiorespiratory fitness and enhanced flexibility, muscular endurance and strength shown through measurements of functional capacity and personal statement of participant.   Increase Strength and Stamina Yes   Intervention Provide advice, education, support and counseling about physical activity/exercise needs.;Develop an individualized exercise prescription for aerobic and resistive training based  on initial evaluation findings, risk stratification, comorbidities and participant's personal goals.   Expected Outcomes Achievement of increased cardiorespiratory fitness and enhanced flexibility, muscular endurance and strength shown through measurements of functional capacity and personal statement of participant.   Diabetes Yes   Intervention Provide education about signs/symptoms and action to take for hypo/hyperglycemia.;Provide education about proper nutrition, including hydration, and aerobic/resistive exercise prescription along with prescribed medications to achieve blood glucose in normal ranges: Fasting glucose 65-99 mg/dL   Expected Outcomes Short Term: Participant verbalizes understanding of the signs/symptoms and immediate care of hyper/hypoglycemia, proper foot care and importance of medication, aerobic/resistive exercise and nutrition plan for blood glucose control.;Long Term: Attainment of HbA1C < 7%.   Hypertension Yes   Intervention Provide education on lifestyle modifcations including regular physical activity/exercise, weight management, moderate sodium restriction and increased consumption of fresh fruit, vegetables, and low fat dairy, alcohol moderation, and smoking cessation.;Monitor prescription use compliance.   Expected Outcomes Short Term: Continued assessment and intervention until BP is < 140/6590mm HG in hypertensive participants. < 130/5580mm HG in hypertensive participants with diabetes, heart failure or chronic kidney disease.;Long Term: Maintenance of blood pressure at goal levels.   Lipids Yes   Intervention Provide education and support for participant on nutrition & aerobic/resistive exercise along with prescribed medications to achieve LDL 70mg , HDL >40mg .   Expected Outcomes Short Term: Participant states understanding of desired cholesterol values and is compliant with medications prescribed. Participant is following exercise prescription and nutrition  guidelines.;Long Term: Cholesterol controlled with medications as prescribed, with individualized exercise RX and with personalized nutrition plan. Value goals: LDL < 70mg , HDL > 40 mg.      Tobacco Use Initial Evaluation: History  Smoking Status  . Never Smoker  Smokeless Tobacco  . Current User  . Types: Chew    Copy of goals given to participant.

## 2016-09-28 NOTE — Progress Notes (Signed)
Cardiac Individual Treatment Plan  Patient Details  Name: Jason [name] MRN: [id number] Date of Birth: [birth year] Referring Provider:   Flowsheet Row Cardiac Rehab from 09/28/2016 in [facility] Cardiac and Pulmonary Rehab  Referring Provider  Arnoldo Hooker MD      Initial Encounter Date:  Flowsheet Row Cardiac Rehab from 09/28/2016 in [facility] Cardiac and Pulmonary Rehab  Date  09/28/16  Referring Provider  Arnoldo Hooker MD      Visit Diagnosis: S/P [name]  Patient's Home Medications on Admission:  Current Outpatient Prescriptions:  .  atorvastatin (LIPITOR) 80 MG tablet, Take 1 tablet (80 mg total) by mouth daily at 6 PM., Disp: 30 tablet, Rfl: 1 .  canagliflozin (INVOKANA) 300 MG TABS tablet, Take 300 mg by mouth daily before breakfast. Reported on 04/13/2016, Disp: , Rfl:  .  carvedilol (COREG) 6.25 MG tablet, Take 1 tablet (6.25 mg total) by mouth 2 (two) times daily with a meal., Disp: 60 tablet, Rfl: 1 .  clopidogrel (PLAVIX) 75 MG tablet, Take 1 tablet (75 mg total) by mouth daily., Disp: 30 tablet, Rfl: 1 .  gemfibrozil (LOPID) 600 MG tablet, Take 600 mg by mouth 2 (two) times daily before a meal., Disp: , Rfl:  .  glimepiride (AMARYL) 4 MG tablet, Take 4 mg by mouth 2 (two) times daily. Reported on 04/13/2016, Disp: , Rfl:  .  losartan-hydrochlorothiazide (HYZAAR) 100-25 MG tablet, Take 0.5 tablets by mouth every evening., Disp: 30 tablet, Rfl: 1 .  metFORMIN (GLUCOPHAGE-XR) 750 MG 24 hr tablet, Take 750 mg by mouth daily with breakfast. Reported on 04/13/2016, Disp: , Rfl:   Past Medical History: Past Medical History:  Diagnosis Date  . Arthritis   . 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 Cough    CHRONIC  . Diabetes mellitus without complication (HCC)   . Edema    FEET/LEGS  . Essential hypertension   . GERD (gastroesophageal reflux disease)   . Hypertension   . Left main coronary artery disease 04/13/2016   . Mixed hyperlipidemia   . Obesity   . 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 Type II diabetes mellitus (HCC)   . Unstable angina (HCC) 04/13/2016    Tobacco Use: History  Smoking Status  . Never Smoker  Smokeless Tobacco  . Current User  . Types: Chew    Labs: Recent Review Flowsheet Data    Labs for ITP Cardiac and Pulmonary Rehab Latest Ref Rng & Units 04/14/2016 04/14/2016 04/14/2016 04/15/2016 04/23/2016   Cholestrol 0 - 200 mg/dL - - - - -   LDLCALC 0 - 99 mg/dL - - - - -   HDL >40 mg/dL - - - - -   Trlycerides <150 mg/dL - - - - 981   Hemoglobin A1c 4.8 - 5.6 % - - - - -   PHART 7.350 - 7.450 7.403 7.328(L) - - -   PCO2ART 35.0 - 45.0 mmHg 40.6 47.5(H) - - -   HCO3 20.0 - 24.0 mEq/L 25.4(H) 25.0(H) - - -   TCO2 0 - 100 mmol/L 27 26 26 26  -   ACIDBASEDEF 0.0 - 2.0 mmol/L - 1.0 - - -   O2SAT % 98.0 91.0 - - -       Exercise Target Goals: Date: 09/28/16  Exercise Program Goal: Individual exercise prescription  set with THRR, safety & activity barriers. Participant demonstrates ability to understand and report RPE using BORG scale, to self-measure pulse accurately, and to acknowledge the importance of the exercise prescription.  Exercise Prescription Goal: Starting with aerobic activity 30 plus minutes a day, 3 days per week for initial exercise prescription. Provide home exercise prescription and guidelines that participant acknowledges understanding prior to discharge.  Activity Barriers & Risk Stratification:     Activity Barriers & Cardiac Risk Stratification - 09/28/16 1500      Activity Barriers & Cardiac Risk Stratification   Activity Barriers Arthritis;Back Problems;Balance Concerns;Joint Problems;Deconditioning;Shortness of Breath;Muscular Weakness  herniated disc L5 L6 surfery in past, both knees arthritis   Cardiac Risk Stratification High      6 Minute Walk:     6 Minute Walk    Row  Name 09/28/16 1457         6 Minute Walk   Phase Initial     Distance 1210 feet     Walk Time 6 minutes     # of Rest Breaks 0     MPH 2.29     METS 2.78     RPE 11     Perceived Dyspnea  2     VO2 Peak 9.76     Symptoms Yes (comment)     Comments chronic knee pain 6/10, SOB     Resting HR 90 bpm     Resting BP 136/66     Max Ex. HR 121 bpm     Max Ex. BP 136/64     2 Minute Post BP 124/62        Initial Exercise Prescription:     Initial Exercise Prescription - 09/28/16 1400      Date of Initial Exercise RX and Referring Provider   Date 09/28/16   Referring Provider Arnoldo Hooker MD     Treadmill   MPH 2.2   Grade 0.5   Minutes 15   METs 2.84     NuStep   Level 3   Watts --  80-100 spm   Minutes 15   METs 2     REL-XR   Level 2   Watts --  speed 50   Minutes 15   METs 2     Prescription Details   Frequency (times per week) 3   Duration Progress to 45 minutes of aerobic exercise without signs/symptoms of physical distress     Intensity   THRR 40-80% of Max Heartrate 116-141   Ratings of Perceived Exertion 11-15   Perceived Dyspnea 0-4     Progression   Progression Continue to progress workloads to maintain intensity without signs/symptoms of physical distress.     Resistance Training   Training Prescription Yes   Weight 4 lbs   Reps 10-12      Perform Capillary Blood Glucose checks as needed.  Exercise Prescription Changes:     Exercise Prescription Changes    Row Name 09/28/16 1400             Exercise Review   Progression -  walk test results         Response to Exercise   Blood Pressure (Admit) 136/66       Blood Pressure (Exercise) 136/64       Blood Pressure (Exit) 124/62       Heart Rate (Admit) 90 bpm       Heart Rate (Exercise) 121 bpm       Heart  Rate (Exit) 93 bpm       Oxygen Saturation (Admit) 96 %       Rating of Perceived Exertion (Exercise) 11       Symptoms knee pain 6/10 and SOB          Exercise  Comments:     Exercise Comments    Row Name 09/28/16 1500           Exercise Comments Primary goal is to increase stamina.          Discharge Exercise Prescription (Final Exercise Prescription Changes):     Exercise Prescription Changes - 09/28/16 1400      Exercise Review   Progression --  walk test results     Response to Exercise   Blood Pressure (Admit) 136/66   Blood Pressure (Exercise) 136/64   Blood Pressure (Exit) 124/62   Heart Rate (Admit) 90 bpm   Heart Rate (Exercise) 121 bpm   Heart Rate (Exit) 93 bpm   Oxygen Saturation (Admit) 96 %   Rating of Perceived Exertion (Exercise) 11   Symptoms knee pain 6/10 and SOB      Nutrition:  Target Goals: Understanding of nutrition guidelines, daily intake of sodium 1500mg , cholesterol 200mg , calories 30% from fat and 7% or less from saturated fats, daily to have 5 or more servings of fruits and vegetables.  Biometrics:     Pre Biometrics - 09/28/16 1502      Pre Biometrics   Height 6' 3.5" (1.918 m)   Weight 282 lb 12.8 oz (128.3 kg)   Waist Circumference 46.75 inches   Hip Circumference 45.5 inches   Waist to Hip Ratio 1.03 %   BMI (Calculated) 35   Single Leg Stand 3.59 seconds       Nutrition Therapy Plan and Nutrition Goals:     Nutrition Therapy & Goals - 09/28/16 1455      Intervention Plan   Intervention Prescribe, educate and counsel regarding individualized specific dietary modifications aiming towards targeted core components such as weight, hypertension, lipid management, diabetes, heart failure and other comorbidities.   Expected Outcomes Short Term Goal: Understand basic principles of dietary content, such as calories, fat, sodium, cholesterol and nutrients.;Short Term Goal: A plan has been developed with personal nutrition goals set during dietitian appointment.;Long Term Goal: Adherence to prescribed nutrition plan.      Nutrition Discharge: Rate Your Plate Scores:     Nutrition  Assessments - 09/28/16 1455      Rate Your Plate Scores   Pre Score 70   Pre Score % 77.8 %      Nutrition Goals Re-Evaluation:   Psychosocial: Target Goals: Acknowledge presence or absence of depression, maximize coping skills, provide positive support system. Participant is able to verbalize types and ability to use techniques and skills needed for reducing stress and depression.  Initial Review & Psychosocial Screening:     Initial Psych Review & Screening - 09/28/16 1458      Initial Review   Current issues with --  none reported     Family Dynamics   Good Support System? Yes  wife good friend/neighbor     Barriers   Psychosocial barriers to participate in program There are no identifiable barriers or psychosocial needs.;The patient should benefit from training in stress management and relaxation.     Screening Interventions   Interventions Encouraged to exercise      Quality of Life Scores:     Quality of Life - 09/28/16  1458      Quality of Life Scores   Health/Function Pre 25.07 %   Socioeconomic Pre 28.5 %   Psych/Spiritual Pre 28.29 %   Family Pre 28.5 %   GLOBAL Pre 26.94 %      PHQ-9: Recent Review Flowsheet Data    [facility]   Decreased Interest 0   Down, Depressed, Hopeless 0   PHQ - 2 Score 0   Altered sleeping 0   Tired, decreased energy 1   Change in appetite 0   Feeling bad or failure about yourself  1   Trouble concentrating 0   Moving slowly or fidgety/restless 0   Suicidal thoughts 0   PHQ-9 Score 2   Difficult doing work/chores Not difficult at all      Psychosocial Evaluation and Intervention:   Psychosocial Re-Evaluation:   Vocational Rehabilitation: Provide vocational rehab assistance to qualifying candidates.   Vocational Rehab Evaluation & Intervention:     Vocational Rehab - 09/28/16 1501      Initial Vocational Rehab Evaluation & Intervention   Assessment shows need for Vocational  Rehabilitation No      Education: Education Goals: Education classes will be provided on a weekly basis, covering required topics. Participant will state understanding/return demonstration of topics presented.  Learning Barriers/Preferences:     Learning Barriers/Preferences - 09/28/16 1501      Learning Barriers/Preferences   Learning Barriers None   Learning Preferences Skilled Demonstration;Written Material      Education Topics: General Nutrition Guidelines/Fats and Fiber: -Group instruction provided by verbal, written material, models and posters to present the general guidelines for heart healthy nutrition. Gives an explanation and review of dietary fats and fiber.   Controlling Sodium/Reading Food Labels: -Group verbal and written material supporting the discussion of sodium use in heart healthy nutrition. Review and explanation with models, verbal and written materials for utilization of the food label.   Exercise Physiology & Risk Factors: - Group verbal and written instruction with models to review the exercise physiology of the cardiovascular system and associated critical values. Details cardiovascular disease risk factors and the goals associated with each risk factor.   Aerobic Exercise & Resistance Training: - Gives group verbal and written discussion on the health impact of inactivity. On the components of aerobic and resistive training programs and the benefits of this training and how to safely progress through these programs.   Flexibility, Balance, General Exercise Guidelines: - Provides group verbal and written instruction on the benefits of flexibility and balance training programs. Provides general exercise guidelines with specific guidelines to those with heart or lung disease. Demonstration and skill practice provided.   Stress Management: - Provides group verbal and written instruction about the health risks of elevated stress, cause of high stress,  and healthy ways to reduce stress.   Depression: - Provides group verbal and written instruction on the correlation between heart/lung disease and depressed mood, treatment options, and the stigmas associated with seeking treatment.   Anatomy & Physiology of the Heart: - Group verbal and written instruction and models provide basic cardiac anatomy and physiology, with the coronary electrical and arterial systems. Review of: AMI, Angina, Valve disease, Heart Failure, Cardiac Arrhythmia, Pacemakers, and the ICD.   Cardiac Procedures: - Group verbal and written instruction and models to describe the testing methods done to diagnose heart disease. Reviews the outcomes of the test results. Describes the treatment choices: Medical Management, Angioplasty, or Coronary Bypass Surgery.   Cardiac  Medications: - Group verbal and written instruction to review commonly prescribed medications for heart disease. Reviews the medication, class of the drug, and side effects. Includes the steps to properly store meds and maintain the prescription regimen.   Go Sex-Intimacy & Heart Disease, Get SMART - Goal Setting: - Group verbal and written instruction through game format to discuss heart disease and the return to sexual intimacy. Provides group verbal and written material to discuss and apply goal setting through the application of the S.M.A.R.T. Method.   Other Matters of the Heart: - Provides group verbal, written materials and models to describe Heart Failure, Angina, Valve Disease, and Diabetes in the realm of heart disease. Includes description of the disease process and treatment options available to the cardiac patient.   Exercise & Equipment Safety: - Individual verbal instruction and demonstration of equipment use and safety with use of the equipment. Flowsheet Row [facility]  Date  09/28/16  Educator  SB  Instruction Review Code  2- meets  goals/outcomes      Infection Prevention: - Provides verbal and written material to individual with discussion of infection control including proper hand washing and proper equipment cleaning during exercise session. Flowsheet Row [facility]  Date  09/28/16  Educator  SB  Instruction Review Code  2- meets goals/outcomes      Falls Prevention: - Provides verbal and written material to individual with discussion of falls prevention and safety. Flowsheet Row [facility]  Date  09/28/16  Educator  SB  Instruction Review Code  2- meets goals/outcomes      Diabetes: - Individual verbal and written instruction to review signs/symptoms of diabetes, desired ranges of glucose level fasting, after meals and with exercise. Advice that pre and post exercise glucose checks will be done for 3 sessions at entry of program. Flowsheet Row [facility]  Date  09/28/16  Educator  SB  Instruction Review Code  2- meets goals/outcomes       Knowledge Questionnaire Score:     Knowledge Questionnaire Score - 09/28/16 1501      Knowledge Questionnaire Score   Pre Score 25/28      Core Components/Risk Factors/Patient Goals at Admission:     Personal Goals and Risk Factors at Admission - 09/28/16 1455      Core Components/Risk Factors/Patient Goals on Admission    Weight Management Obesity;Weight Maintenance;Yes   Intervention Weight Management: Develop a combined nutrition and exercise program designed to reach desired caloric intake, while maintaining appropriate intake of nutrient and fiber, sodium and fats, and appropriate energy expenditure required for the weight goal.   Admit Weight 282 lb 12.8 oz (128.3 kg)   Goal Weight: Short Term 280 lb (127 kg)   Goal Weight: Long Term 235 lb (106.6 kg)   Expected Outcomes Short Term:  Continue to assess and modify interventions until short term weight is achieved;Long Term: Adherence to nutrition and physical activity/exercise program aimed toward attainment of established weight goal;Weight Loss: Understanding of general recommendations for a balanced deficit meal plan, which promotes 1-2 lb weight loss per week and includes a negative energy balance of [phone] kcal/d   Sedentary Yes   Intervention Provide advice, education, support and counseling about physical activity/exercise needs.;Develop an individualized exercise prescription for aerobic and resistive training based on initial evaluation  findings, risk stratification, comorbidities and participant's personal goals.   Expected Outcomes Achievement of increased cardiorespiratory fitness and enhanced flexibility, muscular endurance and strength shown through measurements of functional capacity and personal statement of participant.   Increase Strength and Stamina Yes   Intervention Provide advice, education, support and counseling about physical activity/exercise needs.;Develop an individualized exercise prescription for aerobic and resistive training based on initial evaluation findings, risk stratification, comorbidities and participant's personal goals.   Expected Outcomes Achievement of increased cardiorespiratory fitness and enhanced flexibility, muscular endurance and strength shown through measurements of functional capacity and personal statement of participant.   Diabetes Yes   Intervention Provide education about signs/symptoms and action to take for hypo/hyperglycemia.;Provide education about proper nutrition, including hydration, and aerobic/resistive exercise prescription along with prescribed medications to achieve blood glucose in normal ranges: Fasting glucose 65-99 mg/dL   Expected Outcomes Short Term: Participant verbalizes understanding of the signs/symptoms and immediate care of hyper/hypoglycemia, proper foot  care and importance of medication, aerobic/resistive exercise and nutrition plan for blood glucose control.;Long Term: Attainment of HbA1C < 7%.   Hypertension Yes   Intervention Provide education on lifestyle modifcations including regular physical activity/exercise, weight management, moderate sodium restriction and increased consumption of fresh fruit, vegetables, and low fat dairy, alcohol moderation, and smoking cessation.;Monitor prescription use compliance.   Expected Outcomes Short Term: Continued assessment and intervention until BP is < 140/15mm HG in hypertensive participants. < 130/68mm HG in hypertensive participants with diabetes, heart failure or chronic kidney disease.;Long Term: Maintenance of blood pressure at goal levels.   Lipids Yes   Intervention Provide education and support for participant on nutrition & aerobic/resistive exercise along with prescribed medications to achieve LDL 70mg , HDL >40mg .   Expected Outcomes Short Term: Participant states understanding of desired cholesterol values and is compliant with medications prescribed. Participant is following exercise prescription and nutrition guidelines.;Long Term: Cholesterol controlled with medications as prescribed, with individualized exercise RX and with personalized nutrition plan. Value goals: LDL < 70mg , HDL > 40 mg.      Core Components/Risk Factors/Patient Goals Review:    Core Components/Risk Factors/Patient Goals at Discharge (Final Review):    ITP Comments:     ITP Comments    Row Name 09/28/16 1451           ITP Comments Medical rview completed. Initial ITP created  DIagnosis documentation can be found [facility] encounter 05/25/2016          Comments: Initial ITP

## 2016-09-30 ENCOUNTER — Encounter: Payer: Self-pay | Admitting: *Deleted

## 2016-09-30 ENCOUNTER — Encounter: Payer: Medicare Other | Admitting: *Deleted

## 2016-09-30 DIAGNOSIS — Z951 Presence of aortocoronary bypass graft: Secondary | ICD-10-CM

## 2016-09-30 DIAGNOSIS — Z029 Encounter for administrative examinations, unspecified: Secondary | ICD-10-CM | POA: Diagnosis not present

## 2016-09-30 LAB — GLUCOSE, CAPILLARY
Glucose-Capillary: 209 mg/dL — ABNORMAL HIGH (ref 65–99)
Glucose-Capillary: 182 mg/dL — ABNORMAL HIGH (ref 65–99)

## 2016-09-30 NOTE — Progress Notes (Signed)
Cardiac Individual Treatment Plan  Patient Details  Name: Jason [name] MRN: [id number] Date of Birth: 29-Dec-1950 Referring Provider:   Flowsheet Row Cardiac Rehab from 09/28/2016 in [facility] Cardiac and Pulmonary Rehab  Referring Provider  Serafina Royals MD      Initial Encounter Date:  Flowsheet Row Cardiac Rehab from 09/28/2016 in [facility] Cardiac and Pulmonary Rehab  Date  09/28/16  Referring Provider  Serafina Royals MD      Visit Diagnosis: S/P [name]  Patient's Home Medications on Admission:  Current Outpatient Prescriptions:  .  atorvastatin (LIPITOR) 80 MG tablet, Take 1 tablet (80 mg total) by mouth daily at 6 PM., Disp: 30 tablet, Rfl: 1 .  canagliflozin (INVOKANA) 300 MG TABS tablet, Take 300 mg by mouth daily before breakfast. Reported on 04/13/2016, Disp: , Rfl:  .  carvedilol (COREG) 6.25 MG tablet, Take 1 tablet (6.25 mg total) by mouth 2 (two) times daily with a meal., Disp: 60 tablet, Rfl: 1 .  clopidogrel (PLAVIX) 75 MG tablet, Take 1 tablet (75 mg total) by mouth daily., Disp: 30 tablet, Rfl: 1 .  gemfibrozil (LOPID) 600 MG tablet, Take 600 mg by mouth 2 (two) times daily before a meal., Disp: , Rfl:  .  glimepiride (AMARYL) 4 MG tablet, Take 4 mg by mouth 2 (two) times daily. Reported on 04/13/2016, Disp: , Rfl:  .  losartan-hydrochlorothiazide (HYZAAR) 100-25 MG tablet, Take 0.5 tablets by mouth every evening., Disp: 30 tablet, Rfl: 1 .  metFORMIN (GLUCOPHAGE-XR) 750 MG 24 hr tablet, Take 750 mg by mouth daily with breakfast. Reported on 04/13/2016, Disp: , Rfl:   Past Medical History: Past Medical History:  Diagnosis Date  . Arthritis   . 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 Cough    CHRONIC  . Diabetes mellitus without complication ([place])   . Edema    FEET/LEGS  . Essential hypertension   . GERD (gastroesophageal reflux disease)   . Hypertension   . Left main coronary artery disease 04/13/2016   . Mixed hyperlipidemia   . Obesity   . 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 Type II diabetes mellitus ([place])   . Unstable angina ([place]) 04/13/2016    Tobacco Use: History  Smoking Status  . Never Smoker  Smokeless Tobacco  . Current User  . Types: Chew    Labs: Recent Review Flowsheet Data    Labs for ITP Cardiac and Pulmonary Rehab Latest Ref Rng & Units 04/14/2016 04/14/2016 04/14/2016 04/15/2016 04/23/2016   Cholestrol 0 - 200 mg/dL - - - - -   LDLCALC 0 - 99 mg/dL - - - - -   HDL >40 mg/dL - - - - -   Trlycerides <150 mg/dL - - - - 135   Hemoglobin A1c 4.8 - 5.6 % - - - - -   PHART 7.350 - 7.450 7.403 7.328(L) - - -   PCO2ART 35.0 - 45.0 mmHg 40.6 47.5(H) - - -   HCO3 20.0 - 24.0 mEq/L 25.4(H) 25.0(H) - - -   TCO2 0 - 100 mmol/L _0 -   ACIDBASEDEF 0.0 - 2.0 mmol/L - 1.0 - - -   O2SAT % 98.0 91.0 - - -       Exercise Target Goals:    Exercise Program Goal: Individual exercise prescription  set with THRR, safety & activity barriers. Participant demonstrates ability to understand and report RPE using BORG scale, to self-measure pulse accurately, and to acknowledge the importance of the exercise prescription.  Exercise Prescription Goal: Starting with aerobic activity 30 plus minutes a day, 3 days per week for initial exercise prescription. Provide home exercise prescription and guidelines that participant acknowledges understanding prior to discharge.  Activity Barriers & Risk Stratification:     Activity Barriers & Cardiac Risk Stratification - 09/28/16 1500      Activity Barriers & Cardiac Risk Stratification   Activity Barriers Arthritis;Back Problems;Balance Concerns;Joint Problems;Deconditioning;Shortness of Breath;Muscular Weakness  herniated disc L5 L6 surfery in past, both knees arthritis   Cardiac Risk Stratification High      6 Minute Walk:     6 Minute Walk    Row Name 09/28/16  1457         6 Minute Walk   Phase Initial     Distance 1210 feet     Walk Time 6 minutes     # of Rest Breaks 0     MPH 2.29     METS 2.78     RPE 11     Perceived Dyspnea  2     VO2 Peak 9.76     Symptoms Yes (comment)     Comments chronic knee pain 6/10, SOB     Resting HR 90 bpm     Resting BP 136/66     Max Ex. HR 121 bpm     Max Ex. BP 136/64     2 Minute Post BP 124/62        Initial Exercise Prescription:     Initial Exercise Prescription - 09/28/16 1400      Date of Initial Exercise RX and Referring Provider   Date 09/28/16   Referring Provider Arnoldo Hooker MD     Treadmill   MPH 2.2   Grade 0.5   Minutes 15   METs 2.84     NuStep   Level 3   Watts --  80-100 spm   Minutes 15   METs 2     REL-XR   Level 2   Watts --  speed 50   Minutes 15   METs 2     Prescription Details   Frequency (times per week) 3   Duration Progress to 45 minutes of aerobic exercise without signs/symptoms of physical distress     Intensity   THRR 40-80% of Max Heartrate 116-141   Ratings of Perceived Exertion 11-15   Perceived Dyspnea 0-4     Progression   Progression Continue to progress workloads to maintain intensity without signs/symptoms of physical distress.     Resistance Training   Training Prescription Yes   Weight 4 lbs   Reps 10-12      Perform Capillary Blood Glucose checks as needed.  Exercise Prescription Changes:     Exercise Prescription Changes    Row Name 09/28/16 1400             Exercise Review   Progression -  walk test results         Response to Exercise   Blood Pressure (Admit) 136/66       Blood Pressure (Exercise) 136/64       Blood Pressure (Exit) 124/62       Heart Rate (Admit) 90 bpm       Heart Rate (Exercise) 121 bpm       Heart  Rate (Exit) 93 bpm       Oxygen Saturation (Admit) 96 %       Rating of Perceived Exertion (Exercise) 11       Symptoms knee pain 6/10 and SOB          Exercise Comments:      Exercise Comments    Row Name 09/28/16 1500           Exercise Comments Primary goal is to increase stamina.          Discharge Exercise Prescription (Final Exercise Prescription Changes):     Exercise Prescription Changes - 09/28/16 1400      Exercise Review   Progression --  walk test results     Response to Exercise   Blood Pressure (Admit) 136/66   Blood Pressure (Exercise) 136/64   Blood Pressure (Exit) 124/62   Heart Rate (Admit) 90 bpm   Heart Rate (Exercise) 121 bpm   Heart Rate (Exit) 93 bpm   Oxygen Saturation (Admit) 96 %   Rating of Perceived Exertion (Exercise) 11   Symptoms knee pain 6/10 and SOB      Nutrition:  Target Goals: Understanding of nutrition guidelines, daily intake of sodium 1500mg , cholesterol 200mg , calories 30% from fat and 7% or less from saturated fats, daily to have 5 or more servings of fruits and vegetables.  Biometrics:     Pre Biometrics - 09/28/16 1502      Pre Biometrics   Height 6' 3.5" (1.918 m)   Weight 282 lb 12.8 oz (128.3 kg)   Waist Circumference 46.75 inches   Hip Circumference 45.5 inches   Waist to Hip Ratio 1.03 %   BMI (Calculated) 35   Single Leg Stand 3.59 seconds       Nutrition Therapy Plan and Nutrition Goals:     Nutrition Therapy & Goals - 09/28/16 1455      Intervention Plan   Intervention Prescribe, educate and counsel regarding individualized specific dietary modifications aiming towards targeted core components such as weight, hypertension, lipid management, diabetes, heart failure and other comorbidities.   Expected Outcomes Short Term Goal: Understand basic principles of dietary content, such as calories, fat, sodium, cholesterol and nutrients.;Short Term Goal: A plan has been developed with personal nutrition goals set during dietitian appointment.;Long Term Goal: Adherence to prescribed nutrition plan.      Nutrition Discharge: Rate Your Plate Scores:     Nutrition Assessments -  09/28/16 1455      Rate Your Plate Scores   Pre Score 70   Pre Score % 77.8 %      Nutrition Goals Re-Evaluation:   Psychosocial: Target Goals: Acknowledge presence or absence of depression, maximize coping skills, provide positive support system. Participant is able to verbalize types and ability to use techniques and skills needed for reducing stress and depression.  Initial Review & Psychosocial Screening:     Initial Psych Review & Screening - 09/28/16 1458      Initial Review   Current issues with --  none reported     Family Dynamics   Good Support System? Yes  wife good friend/neighbor     Barriers   Psychosocial barriers to participate in program There are no identifiable barriers or psychosocial needs.;The patient should benefit from training in stress management and relaxation.     Screening Interventions   Interventions Encouraged to exercise      Quality of Life Scores:     Quality of Life - 09/28/16  1458      Quality of Life Scores   Health/Function Pre 25.07 %   Socioeconomic Pre 28.5 %   Psych/Spiritual Pre 28.29 %   Family Pre 28.5 %   GLOBAL Pre 26.94 %      PHQ-9: Recent Review Flowsheet Data    Depression screen [facility] 2/9 09/28/2016   Decreased Interest 0   Down, Depressed, Hopeless 0   PHQ - 2 Score 0   Altered sleeping 0   Tired, decreased energy 1   Change in appetite 0   Feeling bad or failure about yourself  1   Trouble concentrating 0   Moving slowly or fidgety/restless 0   Suicidal thoughts 0   PHQ-9 Score 2   Difficult doing work/chores Not difficult at all      Psychosocial Evaluation and Intervention:   Psychosocial Re-Evaluation:   Vocational Rehabilitation: Provide vocational rehab assistance to qualifying candidates.   Vocational Rehab Evaluation & Intervention:     Vocational Rehab - 09/28/16 1501      Initial Vocational Rehab Evaluation & Intervention   Assessment shows need for Vocational Rehabilitation  No      Education: Education Goals: Education classes will be provided on a weekly basis, covering required topics. Participant will state understanding/return demonstration of topics presented.  Learning Barriers/Preferences:     Learning Barriers/Preferences - 09/28/16 1501      Learning Barriers/Preferences   Learning Barriers None   Learning Preferences Skilled Demonstration;Written Material      Education Topics: General Nutrition Guidelines/Fats and Fiber: -Group instruction provided by verbal, written material, models and posters to present the general guidelines for heart healthy nutrition. Gives an explanation and review of dietary fats and fiber.   Controlling Sodium/Reading Food Labels: -Group verbal and written material supporting the discussion of sodium use in heart healthy nutrition. Review and explanation with models, verbal and written materials for utilization of the food label.   Exercise Physiology & Risk Factors: - Group verbal and written instruction with models to review the exercise physiology of the cardiovascular system and associated critical values. Details cardiovascular disease risk factors and the goals associated with each risk factor.   Aerobic Exercise & Resistance Training: - Gives group verbal and written discussion on the health impact of inactivity. On the components of aerobic and resistive training programs and the benefits of this training and how to safely progress through these programs.   Flexibility, Balance, General Exercise Guidelines: - Provides group verbal and written instruction on the benefits of flexibility and balance training programs. Provides general exercise guidelines with specific guidelines to those with heart or lung disease. Demonstration and skill practice provided.   Stress Management: - Provides group verbal and written instruction about the health risks of elevated stress, cause of high stress, and healthy ways  to reduce stress.   Depression: - Provides group verbal and written instruction on the correlation between heart/lung disease and depressed mood, treatment options, and the stigmas associated with seeking treatment.   Anatomy & Physiology of the Heart: - Group verbal and written instruction and models provide basic cardiac anatomy and physiology, with the coronary electrical and arterial systems. Review of: AMI, Angina, Valve disease, Heart Failure, Cardiac Arrhythmia, Pacemakers, and the ICD.   Cardiac Procedures: - Group verbal and written instruction and models to describe the testing methods done to diagnose heart disease. Reviews the outcomes of the test results. Describes the treatment choices: Medical Management, Angioplasty, or Coronary Bypass Surgery.   Cardiac  Medications: - Group verbal and written instruction to review commonly prescribed medications for heart disease. Reviews the medication, class of the drug, and side effects. Includes the steps to properly store meds and maintain the prescription regimen.   Go Sex-Intimacy & Heart Disease, Get SMART - Goal Setting: - Group verbal and written instruction through game format to discuss heart disease and the return to sexual intimacy. Provides group verbal and written material to discuss and apply goal setting through the application of the S.M.A.R.T. Method.   Other Matters of the Heart: - Provides group verbal, written materials and models to describe Heart Failure, Angina, Valve Disease, and Diabetes in the realm of heart disease. Includes description of the disease process and treatment options available to the cardiac patient.   Exercise & Equipment Safety: - Individual verbal instruction and demonstration of equipment use and safety with use of the equipment. Flowsheet Row Cardiac Rehab from 09/28/2016 in [facility] Cardiac and Pulmonary Rehab  Date  09/28/16  Educator  SB  Instruction Review Code  2- meets goals/outcomes       Infection Prevention: - Provides verbal and written material to individual with discussion of infection control including proper hand washing and proper equipment cleaning during exercise session. Flowsheet Row Cardiac Rehab from 09/28/2016 in [facility] Cardiac and Pulmonary Rehab  Date  09/28/16  Educator  SB  Instruction Review Code  2- meets goals/outcomes      Falls Prevention: - Provides verbal and written material to individual with discussion of falls prevention and safety. Flowsheet Row Cardiac Rehab from 09/28/2016 in [facility] Cardiac and Pulmonary Rehab  Date  09/28/16  Educator  SB  Instruction Review Code  2- meets goals/outcomes      Diabetes: - Individual verbal and written instruction to review signs/symptoms of diabetes, desired ranges of glucose level fasting, after meals and with exercise. Advice that pre and post exercise glucose checks will be done for 3 sessions at entry of program. Flowsheet Row Cardiac Rehab from 09/28/2016 in [facility] Cardiac and Pulmonary Rehab  Date  09/28/16  Educator  SB  Instruction Review Code  2- meets goals/outcomes       Knowledge Questionnaire Score:     Knowledge Questionnaire Score - 09/28/16 1501      Knowledge Questionnaire Score   Pre Score 25/28      Core Components/Risk Factors/Patient Goals at Admission:     Personal Goals and Risk Factors at Admission - 09/28/16 1455      Core Components/Risk Factors/Patient Goals on Admission    Weight Management Obesity;Weight Maintenance;Yes   Intervention Weight Management: Develop a combined nutrition and exercise program designed to reach desired caloric intake, while maintaining appropriate intake of nutrient and fiber, sodium and fats, and appropriate energy expenditure required for the weight goal.   Admit Weight 282 lb 12.8 oz (128.3 kg)   Goal Weight: Short Term 280 lb (127 kg)   Goal Weight: Long Term 235 lb (106.6 kg)   Expected Outcomes Short Term: Continue to assess  and modify interventions until short term weight is achieved;Long Term: Adherence to nutrition and physical activity/exercise program aimed toward attainment of established weight goal;Weight Loss: Understanding of general recommendations for a balanced deficit meal plan, which promotes 1-2 lb weight loss per week and includes a negative energy balance of [phone] kcal/d   Sedentary Yes   Intervention Provide advice, education, support and counseling about physical activity/exercise needs.;Develop an individualized exercise prescription for aerobic and resistive training based on initial evaluation  findings, risk stratification, comorbidities and participant's personal goals.   Expected Outcomes Achievement of increased cardiorespiratory fitness and enhanced flexibility, muscular endurance and strength shown through measurements of functional capacity and personal statement of participant.   Increase Strength and Stamina Yes   Intervention Provide advice, education, support and counseling about physical activity/exercise needs.;Develop an individualized exercise prescription for aerobic and resistive training based on initial evaluation findings, risk stratification, comorbidities and participant's personal goals.   Expected Outcomes Achievement of increased cardiorespiratory fitness and enhanced flexibility, muscular endurance and strength shown through measurements of functional capacity and personal statement of participant.   Diabetes Yes   Intervention Provide education about signs/symptoms and action to take for hypo/hyperglycemia.;Provide education about proper nutrition, including hydration, and aerobic/resistive exercise prescription along with prescribed medications to achieve blood glucose in normal ranges: Fasting glucose 65-99 mg/dL   Expected Outcomes Short Term: Participant verbalizes understanding of the signs/symptoms and immediate care of hyper/hypoglycemia, proper foot care and importance  of medication, aerobic/resistive exercise and nutrition plan for blood glucose control.;Long Term: Attainment of HbA1C < 7%.   Hypertension Yes   Intervention Provide education on lifestyle modifcations including regular physical activity/exercise, weight management, moderate sodium restriction and increased consumption of fresh fruit, vegetables, and low fat dairy, alcohol moderation, and smoking cessation.;Monitor prescription use compliance.   Expected Outcomes Short Term: Continued assessment and intervention until BP is < 140/5490mm HG in hypertensive participants. < 130/5380mm HG in hypertensive participants with diabetes, heart failure or chronic kidney disease.;Long Term: Maintenance of blood pressure at goal levels.   Lipids Yes   Intervention Provide education and support for participant on nutrition & aerobic/resistive exercise along with prescribed medications to achieve LDL 70mg , HDL >40mg .   Expected Outcomes Short Term: Participant states understanding of desired cholesterol values and is compliant with medications prescribed. Participant is following exercise prescription and nutrition guidelines.;Long Term: Cholesterol controlled with medications as prescribed, with individualized exercise RX and with personalized nutrition plan. Value goals: LDL < 70mg , HDL > 40 mg.      Core Components/Risk Factors/Patient Goals Review:    Core Components/Risk Factors/Patient Goals at Discharge (Final Review):    ITP Comments:     ITP Comments    Row Name 09/28/16 1451 09/30/16 0703         ITP Comments Medical rview completed. Initial ITP created  DIagnosis documentation can be found [facility] encounter 05/25/2016 30 day review completed for review by Dr Bethann PunchesMark MIller.  Continue with ITP unless changes noted by Dr Hyacinth MeekerMIller. New to program         Comments:

## 2016-09-30 NOTE — Progress Notes (Signed)
Daily Session Note  Patient Details  Name: Jason [name] MRN: [id number] Date of Birth: [birth year] Referring Provider:   Flowsheet Row Cardiac Rehab from 09/28/2016 in [facility] Cardiac and Pulmonary Rehab  Referring Provider  Serafina Royals MD      Encounter Date: 09/30/2016  Check In:     Session Check In - 09/30/16 0856      Check-In   Location [facility]-Cardiac & Pulmonary Rehab   Staff Present Alberteen Sam, MA, ACSM RCEP, Exercise Physiologist;Susanne Bice, RN, BSN, Lance Sell, BA, ACSM CEP, Exercise Physiologist   Supervising physician immediately available to respond to emergencies See telemetry face sheet for immediately available ER MD   Medication changes reported     No   Fall or balance concerns reported    No   Warm-up and Cool-down Performed on first and last piece of equipment   Resistance Training Performed Yes   VAD Patient? No     Pain Assessment   Currently in Pain? No/denies   Multiple Pain Sites No         Goals Met:  Exercise tolerated well Personal goals reviewed No report of cardiac concerns or symptoms Strength training completed today  Goals Unmet:  Not Applicable  Comments: First full day of exercise!  Patient was oriented to gym and equipment including functions, settings, policies, and procedures.  Patient's individual exercise prescription and treatment plan were reviewed.  All starting workloads were established based on the results of the 6 minute walk test done at initial orientation visit.  The plan for exercise progression was also introduced and progression will be customized based on patient's performance and goals.    Dr. Emily Filbert is Medical Director for [place] and [name].

## 2016-10-02 DIAGNOSIS — Z951 Presence of aortocoronary bypass graft: Secondary | ICD-10-CM

## 2016-10-02 DIAGNOSIS — Z029 Encounter for administrative examinations, unspecified: Secondary | ICD-10-CM | POA: Diagnosis not present

## 2016-10-02 LAB — GLUCOSE, CAPILLARY
GLUCOSE-CAPILLARY: 222 mg/dL — AB (ref 65–99)
Glucose-Capillary: 177 mg/dL — ABNORMAL HIGH (ref 65–99)

## 2016-10-02 NOTE — Progress Notes (Signed)
Daily Session Note  Patient Details  Name: Jason [name] MRN: [id number] Date of Birth: 22-May-1951 Referring Provider:   Flowsheet Row Cardiac Rehab from 09/28/2016 in [facility] Cardiac and Pulmonary Rehab  Referring Provider  Serafina Royals MD      Encounter Date: 10/02/2016  Check In:     Session Check In - 10/02/16 0911      Check-In   Location [facility]-Cardiac & Pulmonary Rehab   Staff Present Heath Lark, RN, BSN, CCRP;Jessica Luan Pulling, MA, ACSM RCEP, Exercise Physiologist;Samik Balkcom Oletta Darter, BA, ACSM CEP, Exercise Physiologist   Supervising physician immediately available to respond to emergencies See telemetry face sheet for immediately available ER MD   Medication changes reported     No   Fall or balance concerns reported    No   Warm-up and Cool-down Performed on first and last piece of equipment   Resistance Training Performed Yes   VAD Patient? No     Pain Assessment   Currently in Pain? No/denies   Multiple Pain Sites No         Goals Met:  Independence with exercise equipment Exercise tolerated well No report of cardiac concerns or symptoms Strength training completed today  Goals Unmet:  Not Applicable  Comments: Pt able to follow exercise prescription today without complaint.  Will continue to monitor for progression.    Dr. Emily Filbert is Medical Director for [place] and [name].

## 2016-10-05 ENCOUNTER — Encounter: Payer: Medicare Other | Admitting: *Deleted

## 2016-10-05 DIAGNOSIS — Z029 Encounter for administrative examinations, unspecified: Secondary | ICD-10-CM | POA: Diagnosis not present

## 2016-10-05 DIAGNOSIS — Z951 Presence of aortocoronary bypass graft: Secondary | ICD-10-CM

## 2016-10-05 LAB — GLUCOSE, CAPILLARY
GLUCOSE-CAPILLARY: 285 mg/dL — AB (ref 65–99)
GLUCOSE-CAPILLARY: 213 mg/dL — AB (ref 65–99)

## 2016-10-05 NOTE — Progress Notes (Signed)
Daily Session Note  Patient Details  Name: ALHAJI MCNEAL MRN: [id number] Date of Birth: 01-30-1951 Referring Provider:   Flowsheet Row Cardiac Rehab from 09/28/2016 in [facility] Cardiac and Pulmonary Rehab  Referring Provider  Serafina Royals MD      Encounter Date: 10/05/2016  Check In:     Session Check In - 10/05/16 0758      Check-In   Location [facility]-Cardiac & Pulmonary Rehab   Staff Present Gerlene Burdock, RN, Moises Blood, BS, ACSM CEP, Exercise Physiologist;Jessica Luan Pulling, MA, ACSM RCEP, Exercise Physiologist   Supervising physician immediately available to respond to emergencies See telemetry face sheet for immediately available ER MD   Medication changes reported     No   Fall or balance concerns reported    No   Warm-up and Cool-down Performed on first and last piece of equipment   Resistance Training Performed Yes   VAD Patient? No     Pain Assessment   Currently in Pain? No/denies   Multiple Pain Sites No         Goals Met:  Independence with exercise equipment Exercise tolerated well No report of cardiac concerns or symptoms Strength training completed today  Goals Unmet:  Not Applicable  Comments: Pt able to follow exercise prescription today without complaint.  Will continue to monitor for progression.    Dr. Emily Filbert is Medical Director for [place] and [name].

## 2016-10-07 DIAGNOSIS — Z951 Presence of aortocoronary bypass graft: Secondary | ICD-10-CM

## 2016-10-07 DIAGNOSIS — Z029 Encounter for administrative examinations, unspecified: Secondary | ICD-10-CM | POA: Diagnosis not present

## 2016-10-07 NOTE — Progress Notes (Signed)
Daily Session Note  Patient Details  Name: Jason [name] MRN: [id number] Date of Birth: [birth year] Referring Provider:   Flowsheet Row Cardiac Rehab from 09/28/2016 in [facility] Cardiac and Pulmonary Rehab  Referring Provider  Serafina Royals MD      Encounter Date: 10/07/2016  Check In:     Session Check In - 10/07/16 0837      Check-In   Location [facility]-Cardiac & Pulmonary Rehab   Staff Present Alberteen Sam, MA, ACSM RCEP, Exercise Physiologist;Ilsa Bonello Oletta Darter, BA, ACSM CEP, Exercise Physiologist;Other  Jena Gauss RN   Supervising physician immediately available to respond to emergencies See telemetry face sheet for immediately available ER MD   Medication changes reported     No   Fall or balance concerns reported    No   Warm-up and Cool-down Performed on first and last piece of equipment   Resistance Training Performed Yes   VAD Patient? No     Pain Assessment   Currently in Pain? No/denies   Multiple Pain Sites No         Goals Met:  Independence with exercise equipment Exercise tolerated well No report of cardiac concerns or symptoms Strength training completed today  Goals Unmet:  Not Applicable  Comments: Reviewed home exercise with pt today.  Pt plans to walk/bike at home for exercise.  Reviewed THR, pulse, RPE, sign and symptoms, NTG use, and when to call 911 or MD.  Also discussed weather considerations and indoor options.  Pt voiced understanding.    Dr. Emily Filbert is Medical Director for [place] and [name].

## 2016-10-09 ENCOUNTER — Encounter: Payer: Medicare Other | Admitting: *Deleted

## 2016-10-09 DIAGNOSIS — Z951 Presence of aortocoronary bypass graft: Secondary | ICD-10-CM

## 2016-10-09 DIAGNOSIS — J069 Acute upper respiratory infection, unspecified: Secondary | ICD-10-CM | POA: Diagnosis not present

## 2016-10-09 DIAGNOSIS — Z029 Encounter for administrative examinations, unspecified: Secondary | ICD-10-CM | POA: Diagnosis not present

## 2016-10-09 DIAGNOSIS — I2510 Atherosclerotic heart disease of native coronary artery without angina pectoris: Secondary | ICD-10-CM | POA: Diagnosis not present

## 2016-10-09 NOTE — Progress Notes (Signed)
Daily Session Note  Patient Details  Name: Jason [name] MRN: [id number] Date of Birth: 13-Nov-1950 Referring Provider:   Flowsheet Row Cardiac Rehab from 09/28/2016 in [facility] Cardiac and Pulmonary Rehab  Referring Provider  Serafina Royals MD      Encounter Date: 10/09/2016  Check In:     Session Check In - 10/09/16 0857      Check-In   Staff Present Nyoka Cowden, RN, BSN, MA;Coleson Kant, RN, BSN, CCRP;Jessica Luan Pulling, MA, ACSM RCEP, Exercise Physiologist   Supervising physician immediately available to respond to emergencies See telemetry face sheet for immediately available ER MD   Medication changes reported     No   Fall or balance concerns reported    No   Warm-up and Cool-down Performed on first and last piece of equipment   Resistance Training Performed Yes   VAD Patient? No     Pain Assessment   Currently in Pain? No/denies         Goals Met:  Exercise tolerated well Personal goals reviewed No report of cardiac concerns or symptoms Strength training completed today  Goals Unmet:  Not Applicable  Comments: Doing well with exercise prescription progression.    Dr. Emily Filbert is Medical Director for [place] and [name].

## 2016-10-12 ENCOUNTER — Encounter: Payer: Medicare Other | Admitting: *Deleted

## 2016-10-12 DIAGNOSIS — Z951 Presence of aortocoronary bypass graft: Secondary | ICD-10-CM

## 2016-10-12 DIAGNOSIS — Z029 Encounter for administrative examinations, unspecified: Secondary | ICD-10-CM | POA: Diagnosis not present

## 2016-10-12 NOTE — Progress Notes (Signed)
Daily Session Note  Patient Details  Name: Jason [name] MRN: [id number] Date of Birth: 01-24-1951 Referring Provider:   Flowsheet Row Cardiac Rehab from 09/28/2016 in [facility] Cardiac and Pulmonary Rehab  Referring Provider  Serafina Royals MD      Encounter Date: 10/12/2016  Check In:     Session Check In - 10/12/16 0754      Check-In   Location [facility]-Cardiac & Pulmonary Rehab   Staff Present Nyoka Cowden, RN, BSN, Willette Pa, MA, ACSM RCEP, Exercise Physiologist;Kelly Amedeo Plenty, BS, ACSM CEP, Exercise Physiologist   Supervising physician immediately available to respond to emergencies See telemetry face sheet for immediately available ER MD   Medication changes reported     No   Fall or balance concerns reported    No   Warm-up and Cool-down Performed on first and last piece of equipment   Resistance Training Performed Yes   VAD Patient? No     Pain Assessment   Currently in Pain? No/denies   Multiple Pain Sites No         Goals Met:  Independence with exercise equipment Exercise tolerated well No report of cardiac concerns or symptoms Strength training completed today  Goals Unmet:  Not Applicable  Comments: Pt able to follow exercise prescription today without complaint.  Will continue to monitor for progression.    Dr. Emily Filbert is Medical Director for [place] and [name].

## 2016-10-14 DIAGNOSIS — Z029 Encounter for administrative examinations, unspecified: Secondary | ICD-10-CM | POA: Diagnosis not present

## 2016-10-14 DIAGNOSIS — Z951 Presence of aortocoronary bypass graft: Secondary | ICD-10-CM

## 2016-10-14 NOTE — Progress Notes (Signed)
Daily Session Note  Patient Details  Name: Jason [name] MRN: [id number] Date of Birth: Dec 27, 1950 Referring Provider:   Flowsheet Row Cardiac Rehab from 09/28/2016 in [facility] Cardiac and Pulmonary Rehab  Referring Provider  Serafina Royals MD      Encounter Date: 10/14/2016  Check In:     Session Check In - 10/14/16 0758      Check-In   Location [facility]-Cardiac & Pulmonary Rehab   Staff Present Alberteen Sam, MA, ACSM RCEP, Exercise Physiologist;Amanda Oletta Darter, BA, ACSM CEP, Exercise Physiologist;Other  Jena Gauss RN   Supervising physician immediately available to respond to emergencies See telemetry face sheet for immediately available ER MD   Medication changes reported     No   Fall or balance concerns reported    No   Warm-up and Cool-down Performed on first and last piece of equipment   Resistance Training Performed Yes   VAD Patient? No     Pain Assessment   Currently in Pain? No/denies   Multiple Pain Sites No         Goals Met:  Independence with exercise equipment Exercise tolerated well No report of cardiac concerns or symptoms Strength training completed today  Goals Unmet:  Not Applicable  Comments: Pt able to follow exercise prescription today without complaint.  Will continue to monitor for progression.    Dr. Emily Filbert is Medical Director for [place] and [name].

## 2016-10-16 ENCOUNTER — Encounter: Payer: Medicare Other | Admitting: *Deleted

## 2016-10-16 DIAGNOSIS — Z029 Encounter for administrative examinations, unspecified: Secondary | ICD-10-CM | POA: Diagnosis not present

## 2016-10-16 DIAGNOSIS — Z951 Presence of aortocoronary bypass graft: Secondary | ICD-10-CM

## 2016-10-16 NOTE — Progress Notes (Signed)
Daily Session Note  Patient Details  Name: Jason [name] MRN: [id number] Date of Birth: 03-15-1951 Referring Provider:   Flowsheet Row Cardiac Rehab from 09/28/2016 in [facility] Cardiac and Pulmonary Rehab  Referring Provider  Serafina Royals MD      Encounter Date: 10/16/2016  Check In:     Session Check In - 10/16/16 0841      Check-In   Location [facility]-Cardiac & Pulmonary Rehab   Staff Present Heath Lark, RN, BSN, CCRP;Janaria Mccammon, RN, Levie Heritage, MA, ACSM RCEP, Exercise Physiologist   Supervising physician immediately available to respond to emergencies See telemetry face sheet for immediately available ER MD   Medication changes reported     No   Fall or balance concerns reported    No   Warm-up and Cool-down Performed on first and last piece of equipment   Resistance Training Performed Yes   VAD Patient? No     Pain Assessment   Currently in Pain? No/denies         Goals Met:  Proper associated with RPD/PD & O2 Sat Exercise tolerated well  Goals Unmet:  Not Applicable  Comments:     Dr. Emily Filbert is Medical Director for [place] and [name].

## 2016-10-21 ENCOUNTER — Encounter: Payer: Medicare Other | Admitting: *Deleted

## 2016-10-21 DIAGNOSIS — Z029 Encounter for administrative examinations, unspecified: Secondary | ICD-10-CM | POA: Diagnosis not present

## 2016-10-21 DIAGNOSIS — Z951 Presence of aortocoronary bypass graft: Secondary | ICD-10-CM

## 2016-10-21 NOTE — Progress Notes (Signed)
Daily Session Note  Patient Details  Name: Jason [name] MRN: [id number] Date of Birth: 1951/01/30 Referring Provider:   Flowsheet Row Cardiac Rehab from 09/28/2016 in [facility] Cardiac and Pulmonary Rehab  Referring Provider  Serafina Royals MD      Encounter Date: 10/21/2016  Check In:     Session Check In - 10/21/16 0852      Check-In   Staff Present Heath Lark, RN, BSN, CCRP;Carroll Enterkin, RN, Levie Heritage, MA, ACSM RCEP, Exercise Physiologist   Supervising physician immediately available to respond to emergencies See telemetry face sheet for immediately available ER MD   Medication changes reported     No   Fall or balance concerns reported    No   Warm-up and Cool-down Performed on first and last piece of equipment   Resistance Training Performed Yes   VAD Patient? No     Pain Assessment   Currently in Pain? No/denies         Goals Met:  Exercise tolerated well No report of cardiac concerns or symptoms Strength training completed today  Goals Unmet:  Not Applicable  Comments: Doing well with exercise prescription progression.    Dr. Emily Filbert is Medical Director for [place] and [name].

## 2016-10-23 ENCOUNTER — Encounter: Payer: Medicare Other | Admitting: *Deleted

## 2016-10-23 DIAGNOSIS — Z029 Encounter for administrative examinations, unspecified: Secondary | ICD-10-CM | POA: Diagnosis not present

## 2016-10-23 DIAGNOSIS — Z951 Presence of aortocoronary bypass graft: Secondary | ICD-10-CM

## 2016-10-23 NOTE — Progress Notes (Signed)
Daily Session Note  Patient Details  Name: Jason [name] MRN: [id number] Date of Birth: [birth year] Referring Provider:   Flowsheet Row Cardiac Rehab from 09/28/2016 in [facility] Cardiac and Pulmonary Rehab  Referring Provider  Serafina Royals MD      Encounter Date: 10/23/2016  Check In:     Session Check In - 10/23/16 0818      Check-In   Location [facility]-Cardiac & Pulmonary Rehab   Staff Present Alberteen Sam, MA, ACSM RCEP, Exercise Physiologist;Carroll Enterkin, RN, Alex Gardener, DPT, CEEA   Supervising physician immediately available to respond to emergencies See telemetry face sheet for immediately available ER MD   Medication changes reported     No   Fall or balance concerns reported    No   Warm-up and Cool-down Performed on first and last piece of equipment   Resistance Training Performed Yes   VAD Patient? No     Pain Assessment   Currently in Pain? No/denies   Multiple Pain Sites No         Goals Met:  Independence with exercise equipment Exercise tolerated well No report of cardiac concerns or symptoms Strength training completed today  Goals Unmet:  Not Applicable  Comments: Pt able to follow exercise prescription today without complaint.  Will continue to monitor for progression.    Dr. Emily Filbert is Medical Director for [place] and [name].

## 2016-10-27 ENCOUNTER — Encounter: Payer: Self-pay | Admitting: *Deleted

## 2016-10-27 DIAGNOSIS — Z951 Presence of aortocoronary bypass graft: Secondary | ICD-10-CM

## 2016-10-27 NOTE — Progress Notes (Signed)
Cardiac Individual Treatment Plan  Patient Details  Name: Jason [name] MRN: [id number] Date of Birth: 29-Dec-1950 Referring Provider:   Flowsheet Row Cardiac Rehab from 09/28/2016 in [facility] Cardiac and Pulmonary Rehab  Referring Provider  Serafina Royals MD      Initial Encounter Date:  Flowsheet Row Cardiac Rehab from 09/28/2016 in [facility] Cardiac and Pulmonary Rehab  Date  09/28/16  Referring Provider  Serafina Royals MD      Visit Diagnosis: S/P [name]  Patient's Home Medications on Admission:  Current Outpatient Prescriptions:  .  atorvastatin (LIPITOR) 80 MG tablet, Take 1 tablet (80 mg total) by mouth daily at 6 PM., Disp: 30 tablet, Rfl: 1 .  canagliflozin (INVOKANA) 300 MG TABS tablet, Take 300 mg by mouth daily before breakfast. Reported on 04/13/2016, Disp: , Rfl:  .  carvedilol (COREG) 6.25 MG tablet, Take 1 tablet (6.25 mg total) by mouth 2 (two) times daily with a meal., Disp: 60 tablet, Rfl: 1 .  clopidogrel (PLAVIX) 75 MG tablet, Take 1 tablet (75 mg total) by mouth daily., Disp: 30 tablet, Rfl: 1 .  gemfibrozil (LOPID) 600 MG tablet, Take 600 mg by mouth 2 (two) times daily before a meal., Disp: , Rfl:  .  glimepiride (AMARYL) 4 MG tablet, Take 4 mg by mouth 2 (two) times daily. Reported on 04/13/2016, Disp: , Rfl:  .  losartan-hydrochlorothiazide (HYZAAR) 100-25 MG tablet, Take 0.5 tablets by mouth every evening., Disp: 30 tablet, Rfl: 1 .  metFORMIN (GLUCOPHAGE-XR) 750 MG 24 hr tablet, Take 750 mg by mouth daily with breakfast. Reported on 04/13/2016, Disp: , Rfl:   Past Medical History: Past Medical History:  Diagnosis Date  . Arthritis   . 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 Cough    CHRONIC  . Diabetes mellitus without complication ([place])   . Edema    FEET/LEGS  . Essential hypertension   . GERD (gastroesophageal reflux disease)   . Hypertension   . Left main coronary artery disease 04/13/2016   . Mixed hyperlipidemia   . Obesity   . 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 Type II diabetes mellitus ([place])   . Unstable angina ([place]) 04/13/2016    Tobacco Use: History  Smoking Status  . Never Smoker  Smokeless Tobacco  . Current User  . Types: Chew    Labs: Recent Review Flowsheet Data    Labs for ITP Cardiac and Pulmonary Rehab Latest Ref Rng & Units 04/14/2016 04/14/2016 04/14/2016 04/15/2016 04/23/2016   Cholestrol 0 - 200 mg/dL - - - - -   LDLCALC 0 - 99 mg/dL - - - - -   HDL >40 mg/dL - - - - -   Trlycerides <150 mg/dL - - - - 135   Hemoglobin A1c 4.8 - 5.6 % - - - - -   PHART 7.350 - 7.450 7.403 7.328(L) - - -   PCO2ART 35.0 - 45.0 mmHg 40.6 47.5(H) - - -   HCO3 20.0 - 24.0 mEq/L 25.4(H) 25.0(H) - - -   TCO2 0 - 100 mmol/L _0 -   ACIDBASEDEF 0.0 - 2.0 mmol/L - 1.0 - - -   O2SAT % 98.0 91.0 - - -       Exercise Target Goals:    Exercise Program Goal: Individual exercise prescription  set with THRR, safety & activity barriers. Participant demonstrates ability to understand and report RPE using BORG scale, to self-measure pulse accurately, and to acknowledge the importance of the exercise prescription.  Exercise Prescription Goal: Starting with aerobic activity 30 plus minutes a day, 3 days per week for initial exercise prescription. Provide home exercise prescription and guidelines that participant acknowledges understanding prior to discharge.  Activity Barriers & Risk Stratification:     Activity Barriers & Cardiac Risk Stratification - 09/28/16 1500      Activity Barriers & Cardiac Risk Stratification   Activity Barriers Arthritis;Back Problems;Balance Concerns;Joint Problems;Deconditioning;Shortness of Breath;Muscular Weakness  herniated disc L5 L6 surfery in past, both knees arthritis   Cardiac Risk Stratification High      6 Minute Walk:     6 Minute Walk    Row Name 09/28/16  1457         6 Minute Walk   Phase Initial     Distance 1210 feet     Walk Time 6 minutes     # of Rest Breaks 0     MPH 2.29     METS 2.78     RPE 11     Perceived Dyspnea  2     VO2 Peak 9.76     Symptoms Yes (comment)     Comments chronic knee pain 6/10, SOB     Resting HR 90 bpm     Resting BP 136/66     Max Ex. HR 121 bpm     Max Ex. BP 136/64     2 Minute Post BP 124/62        Initial Exercise Prescription:     Initial Exercise Prescription - 09/28/16 1400      Date of Initial Exercise RX and Referring Provider   Date 09/28/16   Referring Provider Arnoldo Hooker MD     Treadmill   MPH 2.2   Grade 0.5   Minutes 15   METs 2.84     NuStep   Level 3   Watts --  80-100 spm   Minutes 15   METs 2     REL-XR   Level 2   Watts --  speed 50   Minutes 15   METs 2     Prescription Details   Frequency (times per week) 3   Duration Progress to 45 minutes of aerobic exercise without signs/symptoms of physical distress     Intensity   THRR 40-80% of Max Heartrate 116-141   Ratings of Perceived Exertion 11-15   Perceived Dyspnea 0-4     Progression   Progression Continue to progress workloads to maintain intensity without signs/symptoms of physical distress.     Resistance Training   Training Prescription Yes   Weight 4 lbs   Reps 10-12      Perform Capillary Blood Glucose checks as needed.  Exercise Prescription Changes:     Exercise Prescription Changes    Row Name 09/28/16 1400 10/07/16 0800 10/07/16 1400 10/21/16 1500       Exercise Review   Progression -  walk test results Yes - Yes      Response to Exercise   Blood Pressure (Admit) 136/66 126/72  - 122/60    Blood Pressure (Exercise) 136/64 144/80  - 128/74    Blood Pressure (Exit) 124/62 120/72  - 138/80    Heart Rate (Admit) 90 bpm 55 bpm  - 70 bpm    Heart Rate (Exercise) 121 bpm 115  bpm  - 133 bpm    Heart Rate (Exit) 93 bpm 101 bpm  - 94 bpm    Oxygen Saturation (Admit) 96 %   -  -  -    Rating of Perceived Exertion (Exercise) 11 12  - 13    Symptoms knee pain 6/10 and SOB none  - none    Comments  - Home Exercise Guidelines given 10/07/16  - Home Exercise Guidelines given 10/07/16    Duration  - Progress to 45 minutes of aerobic exercise without signs/symptoms of physical distress  - Progress to 45 minutes of aerobic exercise without signs/symptoms of physical distress    Intensity  - THRR unchanged  - THRR unchanged      Progression   Progression  - Continue to progress workloads to maintain intensity without signs/symptoms of physical distress.  - Continue to progress workloads to maintain intensity without signs/symptoms of physical distress.    Average METs  - 2.95  - 2.52      Resistance Training   Training Prescription  - Yes  - Yes    Weight  - 4 lbs  - 4 lbs    Reps  - 10-12  - 10-12      Interval Training   Interval Training  - No  - No      Treadmill   MPH  - 2.2  - 2.5    Grade  - 0.5  - 0.5    Minutes  - 15  - 15    METs  - 2.84  - 3.26      NuStep   Level  - 3  - 3    Minutes  - 15  - 15    METs  - 3.9  - 2.6      REL-XR   Level  - 2  - 4    Minutes  - 15  - 15    METs  - 2.1  - 1.7      Home Exercise Plan   Plans to continue exercise at  - Home  - Home  walk and bike    Frequency  - Add 2 additional days to program exercise sessions.  - Add 2 additional days to program exercise sessions.       Exercise Comments:     Exercise Comments    Row Name 09/28/16 1500 09/30/16 0857 10/07/16 0840 10/07/16 1456 10/21/16 1540   Exercise Comments Primary goal is to increase stamina. First full day of exercise!  Patient was oriented to gym and equipment including functions, settings, policies, and procedures.  Patient's individual exercise prescription and treatment plan were reviewed.  All starting workloads were established based on the results of the 6 minute walk test done at initial orientation visit.  The plan for exercise progression  was also introduced and progression will be customized based on patient's performance and goals. Reviewed home exercise with pt today.  Pt plans to walk/bike at home for exercise.  Reviewed THR, pulse, RPE, sign and symptoms, NTG use, and when to call 911 or MD.  Also discussed weather considerations and indoor options.  Pt voiced understanding. Jonny RuizJohn is off to a great start with rehab.  Today he did take it a little easier as he was not feeling very good with a head cold.  We will continue to monitor his progression. D[place]ota continues to do well with exercise.  He is starting to feel better from his head cold.  He is now back to his regular workloads.  We will continue to monitor his progression.   Row Name 10/23/16 [phone]           Exercise Comments Reviewed METs average and discussed progression with pt today.          Discharge Exercise Prescription (Final Exercise Prescription Changes):     Exercise Prescription Changes - 10/21/16 1500      Exercise Review   Progression Yes     Response to Exercise   Blood Pressure (Admit) 122/60   Blood Pressure (Exercise) 128/74   Blood Pressure (Exit) 138/80   Heart Rate (Admit) 70 bpm   Heart Rate (Exercise) 133 bpm   Heart Rate (Exit) 94 bpm   Rating of Perceived Exertion (Exercise) 13   Symptoms none   Comments Home Exercise Guidelines given 10/07/16   Duration Progress to 45 minutes of aerobic exercise without signs/symptoms of physical distress   Intensity THRR unchanged     Progression   Progression Continue to progress workloads to maintain intensity without signs/symptoms of physical distress.   Average METs 2.52     Resistance Training   Training Prescription Yes   Weight 4 lbs   Reps 10-12     Interval Training   Interval Training No     Treadmill   MPH 2.5   Grade 0.5   Minutes 15   METs 3.26     NuStep   Level 3   Minutes 15   METs 2.6     REL-XR   Level 4   Minutes 15   METs 1.7     Home Exercise Plan   Plans  to continue exercise at Home  walk and bike   Frequency Add 2 additional days to program exercise sessions.      Nutrition:  Target Goals: Understanding of nutrition guidelines, daily intake of sodium 1500mg , cholesterol 200mg , calories 30% from fat and 7% or less from saturated fats, daily to have 5 or more servings of fruits and vegetables.  Biometrics:     Pre Biometrics - 09/28/16 1502      Pre Biometrics   Height 6' 3.5" (1.918 m)   Weight 282 lb 12.8 oz (128.3 kg)   Waist Circumference 46.75 inches   Hip Circumference 45.5 inches   Waist to Hip Ratio 1.03 %   BMI (Calculated) 35   Single Leg Stand 3.59 seconds       Nutrition Therapy Plan and Nutrition Goals:     Nutrition Therapy & Goals - 10/09/16 1218      Nutrition Therapy   Diet Instructed on a heart healthy meal plan based on 2000 calories and including dietary guidelines for diabetes.   Drug/Food Interactions Statins/Certain Fruits   Fiber 30 grams   Whole Grain Foods 8 servings   Saturated Fats 14 max. grams   Fruits and Vegetables 5 servings/day   Sodium 2000 grams  1500 ideal     Personal Nutrition Goals   Personal Goal #1 Work on better portion control. Refer to hand-out for servings of carbohydrate.   Personal Goal #2 Try [employer]'s brand 100% whole wheat pasts. Also increase whole grains with brown rice/wild rice and oatmeal.    Personal Goal #3 Read labels for saturated fat, trans fat and sodium.     Intervention Plan   Intervention Prescribe, educate and counsel regarding individualized specific dietary modifications aiming towards targeted core components such as weight, hypertension, lipid management, diabetes, heart failure and  other comorbidities.;Nutrition handout(s) given to patient.   Expected Outcomes Short Term Goal: Understand basic principles of dietary content, such as calories, fat, sodium, cholesterol and nutrients.;Short Term Goal: A plan has been developed with personal nutrition  goals set during dietitian appointment.;Long Term Goal: Adherence to prescribed nutrition plan.      Nutrition Discharge: Rate Your Plate Scores:     Nutrition Assessments - 09/28/16 1455      Rate Your Plate Scores   Pre Score 70   Pre Score % 77.8 %      Nutrition Goals Re-Evaluation:   Psychosocial: Target Goals: Acknowledge presence or absence of depression, maximize coping skills, provide positive support system. Participant is able to verbalize types and ability to use techniques and skills needed for reducing stress and depression.  Initial Review & Psychosocial Screening:     Initial Psych Review & Screening - 09/28/16 1458      Initial Review   Current issues with --  none reported     Family Dynamics   Good Support System? Yes  wife good friend/neighbor     Barriers   Psychosocial barriers to participate in program There are no identifiable barriers or psychosocial needs.;The patient should benefit from training in stress management and relaxation.     Screening Interventions   Interventions Encouraged to exercise      Quality of Life Scores:     Quality of Life - 09/28/16 1458      Quality of Life Scores   Health/Function Pre 25.07 %   Socioeconomic Pre 28.5 %   Psych/Spiritual Pre 28.29 %   Family Pre 28.5 %   GLOBAL Pre 26.94 %      PHQ-9: Recent Review Flowsheet Data    Depression screen [facility] 2/9 09/28/2016   Decreased Interest 0   Down, Depressed, Hopeless 0   PHQ - 2 Score 0   Altered sleeping 0   Tired, decreased energy 1   Change in appetite 0   Feeling bad or failure about yourself  1   Trouble concentrating 0   Moving slowly or fidgety/restless 0   Suicidal thoughts 0   PHQ-9 Score 2   Difficult doing work/chores Not difficult at all      Psychosocial Evaluation and Intervention:   Psychosocial Re-Evaluation:     Psychosocial Re-Evaluation    Row Name 10/09/16 0809             Psychosocial Re-Evaluation    Interventions Encouraged to attend Cardiac Rehabilitation for the exercise       Comments Shail states he is doing well, no depression no excess stress.   He has stated that the exercise in the program is definitely helping him with stamina and felling better.          Vocational Rehabilitation: Provide vocational rehab assistance to qualifying candidates.   Vocational Rehab Evaluation & Intervention:     Vocational Rehab - 09/28/16 1501      Initial Vocational Rehab Evaluation & Intervention   Assessment shows need for Vocational Rehabilitation No      Education: Education Goals: Education classes will be provided on a weekly basis, covering required topics. Participant will state understanding/return demonstration of topics presented.  Learning Barriers/Preferences:     Learning Barriers/Preferences - 09/28/16 1501      Learning Barriers/Preferences   Learning Barriers None   Learning Preferences Skilled Demonstration;Written Material      Education Topics: General Nutrition Guidelines/Fats and Fiber: -Group instruction provided by  verbal, written material, models and posters to present the general guidelines for heart healthy nutrition. Gives an explanation and review of dietary fats and fiber.   Controlling Sodium/Reading Food Labels: -Group verbal and written material supporting the discussion of sodium use in heart healthy nutrition. Review and explanation with models, verbal and written materials for utilization of the food label.   Exercise Physiology & Risk Factors: - Group verbal and written instruction with models to review the exercise physiology of the cardiovascular system and associated critical values. Details cardiovascular disease risk factors and the goals associated with each risk factor.   Aerobic Exercise & Resistance Training: - Gives group verbal and written discussion on the health impact of inactivity. On the components of aerobic and resistive  training programs and the benefits of this training and how to safely progress through these programs.   Flexibility, Balance, General Exercise Guidelines: - Provides group verbal and written instruction on the benefits of flexibility and balance training programs. Provides general exercise guidelines with specific guidelines to those with heart or lung disease. Demonstration and skill practice provided.   Stress Management: - Provides group verbal and written instruction about the health risks of elevated stress, cause of high stress, and healthy ways to reduce stress. Flowsheet Row Cardiac Rehab from 10/14/2016 in [facility] Cardiac and Pulmonary Rehab  Date  10/07/16  Educator  [facility]  Instruction Review Code  2- meets goals/outcomes      Depression: - Provides group verbal and written instruction on the correlation between heart/lung disease and depressed mood, treatment options, and the stigmas associated with seeking treatment.   Anatomy & Physiology of the Heart: - Group verbal and written instruction and models provide basic cardiac anatomy and physiology, with the coronary electrical and arterial systems. Review of: AMI, Angina, Valve disease, Heart Failure, Cardiac Arrhythmia, Pacemakers, and the ICD.   Cardiac Procedures: - Group verbal and written instruction and models to describe the testing methods done to diagnose heart disease. Reviews the outcomes of the test results. Describes the treatment choices: Medical Management, Angioplasty, or Coronary Bypass Surgery. Flowsheet Row Cardiac Rehab from 10/14/2016 in [facility] Cardiac and Pulmonary Rehab  Date  10/05/16  Educator  CE  Instruction Review Code  2- meets goals/outcomes      Cardiac Medications: - Group verbal and written instruction to review commonly prescribed medications for heart disease. Reviews the medication, class of the drug, and side effects. Includes the steps to properly store meds and maintain the prescription  regimen. Flowsheet Row Cardiac Rehab from 10/14/2016 in [facility] Cardiac and Pulmonary Rehab  Date  10/14/16 Alberteen Sam 2]  Educator  TS  Instruction Review Code  2- meets goals/outcomes      Go Sex-Intimacy & Heart Disease, Get SMART - Goal Setting: - Group verbal and written instruction through game format to discuss heart disease and the return to sexual intimacy. Provides group verbal and written material to discuss and apply goal setting through the application of the S.M.A.R.T. Method. Flowsheet Row Cardiac Rehab from 10/14/2016 in [facility] Cardiac and Pulmonary Rehab  Date  10/05/16  Educator  CE  Instruction Review Code  2- meets goals/outcomes      Other Matters of the Heart: - Provides group verbal, written materials and models to describe Heart Failure, Angina, Valve Disease, and Diabetes in the realm of heart disease. Includes description of the disease process and treatment options available to the cardiac patient.   Exercise & Equipment Safety: - Individual verbal instruction and demonstration  of equipment use and safety with use of the equipment. Flowsheet Row Cardiac Rehab from 10/14/2016 in [facility] Cardiac and Pulmonary Rehab  Date  09/28/16  Educator  SB  Instruction Review Code  2- meets goals/outcomes      Infection Prevention: - Provides verbal and written material to individual with discussion of infection control including proper hand washing and proper equipment cleaning during exercise session. Flowsheet Row Cardiac Rehab from 10/14/2016 in [facility] Cardiac and Pulmonary Rehab  Date  09/28/16  Educator  SB  Instruction Review Code  2- meets goals/outcomes      Falls Prevention: - Provides verbal and written material to individual with discussion of falls prevention and safety. Flowsheet Row Cardiac Rehab from 10/14/2016 in [facility] Cardiac and Pulmonary Rehab  Date  09/28/16  Educator  SB  Instruction Review Code  2- meets goals/outcomes      Diabetes: - Individual  verbal and written instruction to review signs/symptoms of diabetes, desired ranges of glucose level fasting, after meals and with exercise. Advice that pre and post exercise glucose checks will be done for 3 sessions at entry of program. Flowsheet Row Cardiac Rehab from 10/14/2016 in [facility] Cardiac and Pulmonary Rehab  Date  09/28/16  Educator  SB  Instruction Review Code  2- meets goals/outcomes       Knowledge Questionnaire Score:     Knowledge Questionnaire Score - 09/28/16 1501      Knowledge Questionnaire Score   Pre Score 25/28      Core Components/Risk Factors/Patient Goals at Admission:     Personal Goals and Risk Factors at Admission - 09/28/16 1455      Core Components/Risk Factors/Patient Goals on Admission    Weight Management Obesity;Weight Maintenance;Yes   Intervention Weight Management: Develop a combined nutrition and exercise program designed to reach desired caloric intake, while maintaining appropriate intake of nutrient and fiber, sodium and fats, and appropriate energy expenditure required for the weight goal.   Admit Weight 282 lb 12.8 oz (128.3 kg)   Goal Weight: Short Term 280 lb (127 kg)   Goal Weight: Long Term 235 lb (106.6 kg)   Expected Outcomes Short Term: Continue to assess and modify interventions until short term weight is achieved;Long Term: Adherence to nutrition and physical activity/exercise program aimed toward attainment of established weight goal;Weight Loss: Understanding of general recommendations for a balanced deficit meal plan, which promotes 1-2 lb weight loss per week and includes a negative energy balance of [phone] kcal/d   Sedentary Yes   Intervention Provide advice, education, support and counseling about physical activity/exercise needs.;Develop an individualized exercise prescription for aerobic and resistive training based on initial evaluation findings, risk stratification, comorbidities and participant's personal goals.    Expected Outcomes Achievement of increased cardiorespiratory fitness and enhanced flexibility, muscular endurance and strength shown through measurements of functional capacity and personal statement of participant.   Increase Strength and Stamina Yes   Intervention Provide advice, education, support and counseling about physical activity/exercise needs.;Develop an individualized exercise prescription for aerobic and resistive training based on initial evaluation findings, risk stratification, comorbidities and participant's personal goals.   Expected Outcomes Achievement of increased cardiorespiratory fitness and enhanced flexibility, muscular endurance and strength shown through measurements of functional capacity and personal statement of participant.   Diabetes Yes   Intervention Provide education about signs/symptoms and action to take for hypo/hyperglycemia.;Provide education about proper nutrition, including hydration, and aerobic/resistive exercise prescription along with prescribed medications to achieve blood glucose in normal ranges: Fasting glucose  65-99 mg/dL   Expected Outcomes Short Term: Participant verbalizes understanding of the signs/symptoms and immediate care of hyper/hypoglycemia, proper foot care and importance of medication, aerobic/resistive exercise and nutrition plan for blood glucose control.;Long Term: Attainment of HbA1C < 7%.   Hypertension Yes   Intervention Provide education on lifestyle modifcations including regular physical activity/exercise, weight management, moderate sodium restriction and increased consumption of fresh fruit, vegetables, and low fat dairy, alcohol moderation, and smoking cessation.;Monitor prescription use compliance.   Expected Outcomes Short Term: Continued assessment and intervention until BP is < 140/94mm HG in hypertensive participants. < 130/55mm HG in hypertensive participants with diabetes, heart failure or chronic kidney disease.;Long Term:  Maintenance of blood pressure at goal levels.   Lipids Yes   Intervention Provide education and support for participant on nutrition & aerobic/resistive exercise along with prescribed medications to achieve LDL 70mg , HDL >40mg .   Expected Outcomes Short Term: Participant states understanding of desired cholesterol values and is compliant with medications prescribed. Participant is following exercise prescription and nutrition guidelines.;Long Term: Cholesterol controlled with medications as prescribed, with individualized exercise RX and with personalized nutrition plan. Value goals: LDL < 70mg , HDL > 40 mg.      Core Components/Risk Factors/Patient Goals Review:      Goals and Risk Factor Review    Row Name 10/09/16 0813             Core Components/Risk Factors/Patient Goals Review   Personal Goals Review Weight Management/Obesity;Lipids;Hypertension;Diabetes       Review Vidyuth has gained the 2 pounds he has lost since starting program.  He has been exercising  in class and active at home . He has RD appt today and expects to learn more to work on nutrition.  HAs attended diabetes program and is already following guidelines.  Blood sugar average 124.  BP reading are in good range.        Expected Outcomes Continue to work on risk factor control with the exrcise, nutriton and meds prescribed.           Core Components/Risk Factors/Patient Goals at Discharge (Final Review):      Goals and Risk Factor Review - 10/09/16 0813      Core Components/Risk Factors/Patient Goals Review   Personal Goals Review Weight Management/Obesity;Lipids;Hypertension;Diabetes   Review Trevaun has gained the 2 pounds he has lost since starting program.  He has been exercising  in class and active at home . He has RD appt today and expects to learn more to work on nutrition.  HAs attended diabetes program and is already following guidelines.  Blood sugar average 124.  BP reading are in good range.    Expected  Outcomes Continue to work on risk factor control with the exrcise, nutriton and meds prescribed.       ITP Comments:     ITP Comments    Row Name 09/28/16 1451 09/30/16 0703 10/27/16 0601       ITP Comments Medical rview completed. Initial ITP created  DIagnosis documentation can be found [facility] encounter 05/25/2016 30 day review completed for review by Dr Bethann Punches.  Continue with ITP unless changes noted by Dr Hyacinth Meeker. New to program 30 day review. Continue with ITP unless directed changes per Medical Director review.        Comments:

## 2016-10-28 ENCOUNTER — Encounter: Payer: Medicare Other | Attending: Thoracic Surgery (Cardiothoracic Vascular Surgery)

## 2016-10-28 DIAGNOSIS — Z029 Encounter for administrative examinations, unspecified: Secondary | ICD-10-CM | POA: Diagnosis present

## 2016-10-28 DIAGNOSIS — Z951 Presence of aortocoronary bypass graft: Secondary | ICD-10-CM

## 2016-10-28 NOTE — Progress Notes (Signed)
Daily Session Note  Patient Details  Name: Jason [name] MRN: [id number] Date of Birth: 1951-03-11 Referring Provider:   Flowsheet Row Cardiac Rehab from 09/28/2016 in [facility] Cardiac and Pulmonary Rehab  Referring Provider  Serafina Royals MD      Encounter Date: 10/28/2016  Check In:     Session Check In - 10/28/16 0809      Check-In   Location [facility]-Cardiac & Pulmonary Rehab   Staff Present Alberteen Sam, MA, ACSM RCEP, Exercise Physiologist;Patricia Surles RN Vickki Hearing, BA, ACSM CEP, Exercise Physiologist   Supervising physician immediately available to respond to emergencies See telemetry face sheet for immediately available ER MD   Medication changes reported     No   Fall or balance concerns reported    No   Warm-up and Cool-down Performed on first and last piece of equipment   Resistance Training Performed Yes   VAD Patient? No     Pain Assessment   Currently in Pain? No/denies   Multiple Pain Sites No         Goals Met:  Independence with exercise equipment Exercise tolerated well No report of cardiac concerns or symptoms Strength training completed today  Goals Unmet:  Not Applicable  Comments: Pt able to follow exercise prescription today without complaint.  Will continue to monitor for progression.    Dr. Emily Filbert is Medical Director for [place] and [name].

## 2016-11-02 ENCOUNTER — Encounter: Payer: Medicare Other | Admitting: *Deleted

## 2016-11-02 DIAGNOSIS — Z951 Presence of aortocoronary bypass graft: Secondary | ICD-10-CM

## 2016-11-02 DIAGNOSIS — Z029 Encounter for administrative examinations, unspecified: Secondary | ICD-10-CM | POA: Diagnosis not present

## 2016-11-02 NOTE — Progress Notes (Signed)
Daily Session Note  Patient Details  Name: Jason [name] MRN: [id number] Date of Birth: 1951/10/14 Referring Provider:   Flowsheet Row Cardiac Rehab from 09/28/2016 in [facility] Cardiac and Pulmonary Rehab  Referring Provider  Serafina Royals MD      Encounter Date: 11/02/2016  Check In:     Session Check In - 11/02/16 0847      Check-In   Location [facility]-Cardiac & Pulmonary Rehab   Staff Present Alberteen Sam, MA, ACSM RCEP, Exercise Physiologist;Kelly Amedeo Plenty, BS, ACSM CEP, Exercise Physiologist;Carroll Enterkin, RN, BSN   Supervising physician immediately available to respond to emergencies See telemetry face sheet for immediately available ER MD   Medication changes reported     No   Fall or balance concerns reported    No   Warm-up and Cool-down Performed on first and last piece of equipment   Resistance Training Performed Yes   VAD Patient? No     Pain Assessment   Currently in Pain? No/denies   Multiple Pain Sites No         Goals Met:  Independence with exercise equipment Exercise tolerated well Personal goals reviewed No report of cardiac concerns or symptoms Strength training completed today  Goals Unmet:  Not Applicable  Comments: Pt able to follow exercise prescription today without complaint.  Will continue to monitor for progression.    Dr. Emily Filbert is Medical Director for [place] and [name].

## 2016-11-04 DIAGNOSIS — Z029 Encounter for administrative examinations, unspecified: Secondary | ICD-10-CM | POA: Diagnosis not present

## 2016-11-04 DIAGNOSIS — Z951 Presence of aortocoronary bypass graft: Secondary | ICD-10-CM

## 2016-11-04 NOTE — Progress Notes (Signed)
Daily Session Note  Patient Details  Name: Jason [name] MRN: [id number] Date of Birth: 05/21/1951 Referring Provider:   Flowsheet Row Cardiac Rehab from 09/28/2016 in [facility] Cardiac and Pulmonary Rehab  Referring Provider  Kowalski, Bruce MD      Encounter Date: 11/04/2016  Check In:     Session Check In - 11/04/16 0842      Check-In   Location [facility]-Cardiac & Pulmonary Rehab   Staff Present Susanne Bice, RN, BSN, CCRP;Jessica Hawkins, MA, ACSM RCEP, Exercise Physiologist;Amanda Sommer, BA, ACSM CEP, Exercise Physiologist   Supervising physician immediately available to respond to emergencies See telemetry face sheet for immediately available ER MD   Medication changes reported     No   Fall or balance concerns reported    No   Warm-up and Cool-down Performed on first and last piece of equipment   Resistance Training Performed Yes   VAD Patient? No     Pain Assessment   Currently in Pain? No/denies   Multiple Pain Sites No         Goals Met:  Proper associated with RPD/PD & O2 Sat Independence with exercise equipment Exercise tolerated well Strength training completed today  Goals Unmet:  Not Applicable  Comments: Pt able to follow exercise prescription today without complaint.  Will continue to monitor for progression.    Dr. Mark Miller is Medical Director for [facility] and [name]. 

## 2016-11-06 ENCOUNTER — Encounter: Payer: Medicare Other | Admitting: *Deleted

## 2016-11-06 DIAGNOSIS — Z951 Presence of aortocoronary bypass graft: Secondary | ICD-10-CM

## 2016-11-06 DIAGNOSIS — Z029 Encounter for administrative examinations, unspecified: Secondary | ICD-10-CM | POA: Diagnosis not present

## 2016-11-06 NOTE — Progress Notes (Signed)
Daily Session Note  Patient Details  Name: Jason [name] MRN: [id number] Date of Birth: 11-09-1950 Referring Provider:   Flowsheet Row Cardiac Rehab from 09/28/2016 in [facility] Cardiac and Pulmonary Rehab  Referring Provider  Serafina Royals MD      Encounter Date: 11/06/2016  Check In:     Session Check In - 11/06/16 0838      Check-In   Staff Present Heath Lark, RN, BSN, CCRP;Jessica Luan Pulling, MA, ACSM RCEP, Exercise Physiologist;Carroll Enterkin, RN, BSN   Supervising physician immediately available to respond to emergencies See telemetry face sheet for immediately available ER MD   Medication changes reported     No   Fall or balance concerns reported    No   Warm-up and Cool-down Performed on first and last piece of equipment   Resistance Training Performed Yes   VAD Patient? No     Pain Assessment   Currently in Pain? No/denies         Goals Met:  Exercise tolerated well No report of cardiac concerns or symptoms Strength training completed today  Goals Unmet:  Not Applicable  Comments: Doing well with exercise prescription progression.    Dr. Emily Filbert is Medical Director for [place] and [name].

## 2016-11-09 ENCOUNTER — Encounter: Payer: Medicare Other | Admitting: *Deleted

## 2016-11-09 DIAGNOSIS — Z951 Presence of aortocoronary bypass graft: Secondary | ICD-10-CM

## 2016-11-09 DIAGNOSIS — Z029 Encounter for administrative examinations, unspecified: Secondary | ICD-10-CM | POA: Diagnosis not present

## 2016-11-09 NOTE — Progress Notes (Signed)
Daily Session Note  Patient Details  Name: Jason [name] MRN: [id number] Date of Birth: [birth year] Referring Provider:   Flowsheet Row Cardiac Rehab from 09/28/2016 in [facility] Cardiac and Pulmonary Rehab  Referring Provider  Serafina Royals MD      Encounter Date: 11/09/2016  Check In:     Session Check In - 11/09/16 0845      Check-In   Location [facility]-Cardiac & Pulmonary Rehab   Staff Present Alberteen Sam, MA, ACSM RCEP, Exercise Physiologist;Kelly Amedeo Plenty, BS, ACSM CEP, Exercise Physiologist;Carroll Enterkin, RN, BSN   Supervising physician immediately available to respond to emergencies See telemetry face sheet for immediately available ER MD   Medication changes reported     No   Fall or balance concerns reported    No   Warm-up and Cool-down Performed on first and last piece of equipment   Resistance Training Performed Yes   VAD Patient? No     Pain Assessment   Currently in Pain? No/denies   Multiple Pain Sites No         Goals Met:  Independence with exercise equipment Exercise tolerated well No report of cardiac concerns or symptoms Strength training completed today  Goals Unmet:  Not Applicable  Comments: Pt able to follow exercise prescription today without complaint.  Will continue to monitor for progression.    Dr. Emily Filbert is Medical Director for [place] and [name].

## 2016-11-16 ENCOUNTER — Encounter: Payer: Medicare Other | Admitting: *Deleted

## 2016-11-16 DIAGNOSIS — Z029 Encounter for administrative examinations, unspecified: Secondary | ICD-10-CM | POA: Diagnosis not present

## 2016-11-16 DIAGNOSIS — Z951 Presence of aortocoronary bypass graft: Secondary | ICD-10-CM

## 2016-11-16 NOTE — Progress Notes (Signed)
Daily Session Note  Patient Details  Name: Jason [name] MRN: [id number] Date of Birth: 02/09/1951 Referring Provider:   Flowsheet Row Cardiac Rehab from 09/28/2016 in [facility] Cardiac and Pulmonary Rehab  Referring Provider  Jason Royals MD      Encounter Date: 11/16/2016  Check In:     Session Check In - 11/16/16 0753      Check-In   Location [facility]-Cardiac & Pulmonary Rehab   Staff Present Gerlene Burdock, RN, Moises Blood, BS, ACSM CEP, Exercise Physiologist;Lauriann Milillo Luan Pulling, [place], ACSM RCEP, Exercise Physiologist   Supervising physician immediately available to respond to emergencies See telemetry face sheet for immediately available ER MD   Medication changes reported     No   Fall or balance concerns reported    No   Warm-up and Cool-down Performed on first and last piece of equipment   Resistance Training Performed Yes   VAD Patient? No     Pain Assessment   Currently in Pain? No/denies   Multiple Pain Sites No         Goals Met:  Independence with exercise equipment Exercise tolerated well No report of cardiac concerns or symptoms Strength training completed today  Goals Unmet:  Not Applicable  Comments: Pt able to follow exercise prescription today without complaint.  Will continue to monitor for progression.    Dr. Emily Filbert is Medical Director for [place] and [name].

## 2016-11-18 DIAGNOSIS — Z029 Encounter for administrative examinations, unspecified: Secondary | ICD-10-CM | POA: Diagnosis not present

## 2016-11-18 DIAGNOSIS — Z951 Presence of aortocoronary bypass graft: Secondary | ICD-10-CM

## 2016-11-18 NOTE — Progress Notes (Signed)
Daily Session Note  Patient Details  Name: Jason [name] MRN: [id number] Date of Birth: [birth year] Referring Provider:   Flowsheet Row Cardiac Rehab from 09/28/2016 in [facility] Cardiac and Pulmonary Rehab  Referring Provider  Serafina Royals MD      Encounter Date: 11/18/2016  Check In:     Session Check In - 11/18/16 0818      Check-In   Location [facility]-Cardiac & Pulmonary Rehab   Staff Present Heath Lark, RN, BSN, CCRP;Jessica Tucker, MA, ACSM RCEP, Exercise Physiologist;Sheri Prows Oletta Darter, BA, ACSM CEP, Exercise Physiologist   Supervising physician immediately available to respond to emergencies See telemetry face sheet for immediately available ER MD   Medication changes reported     No   Fall or balance concerns reported    No   Warm-up and Cool-down Performed on first and last piece of equipment   Resistance Training Performed Yes   VAD Patient? No     Pain Assessment   Currently in Pain? No/denies   Multiple Pain Sites No           Exercise Prescription Changes - 11/17/16 1500      Exercise Review   Progression Yes     Response to Exercise   Blood Pressure (Admit) 132/84   Blood Pressure (Exercise) 136/74   Blood Pressure (Exit) 120/60   Heart Rate (Admit) 87 bpm   Heart Rate (Exercise) 118 bpm   Heart Rate (Exit) 91 bpm   Rating of Perceived Exertion (Exercise) 13   Symptoms none   Comments Home Exercise Guidelines given 10/07/16   Duration Progress to 45 minutes of aerobic exercise without signs/symptoms of physical distress   Intensity THRR unchanged     Progression   Progression Continue to progress workloads to maintain intensity without signs/symptoms of physical distress.   Average METs 3.69     Resistance Training   Training Prescription Yes   Weight 6 lbs   Reps 10-15     Interval Training   Interval Training No     Treadmill   MPH 2.5   Grade 0.5   Minutes 15   METs 3.09     NuStep   Level 5   Minutes 15   METs 5.3     REL-XR   Level 4   Minutes 15   METs 2.5     Home Exercise Plan   Plans to continue exercise at Home  walk and bike   Frequency Add 2 additional days to program exercise sessions.      Goals Met:  Independence with exercise equipment Exercise tolerated well No report of cardiac concerns or symptoms Strength training completed today  Goals Unmet:  Not Applicable  Comments: Pt able to follow exercise prescription today without complaint.  Will continue to monitor for progression.    Dr. Emily Filbert is Medical Director for [place] and [name].

## 2016-11-20 ENCOUNTER — Encounter: Payer: Medicare Other | Admitting: *Deleted

## 2016-11-20 DIAGNOSIS — Z029 Encounter for administrative examinations, unspecified: Secondary | ICD-10-CM | POA: Diagnosis not present

## 2016-11-20 DIAGNOSIS — Z951 Presence of aortocoronary bypass graft: Secondary | ICD-10-CM

## 2016-11-20 NOTE — Progress Notes (Signed)
Daily Session Note  Patient Details  Name: Jason [name] MRN: [id number] Date of Birth: April 27, 1951 Referring Provider:   Flowsheet Row Cardiac Rehab from 09/28/2016 in [facility] Cardiac and Pulmonary Rehab  Referring Provider  Serafina Royals MD      Encounter Date: 11/20/2016  Check In:     Session Check In - 11/20/16 0839      Check-In   Location [facility]-Cardiac & Pulmonary Rehab   Staff Present Alberteen Sam, MA, ACSM RCEP, Exercise Physiologist;Carroll Enterkin, RN, BSN;Other   Supervising physician immediately available to respond to emergencies See telemetry face sheet for immediately available ER MD   Medication changes reported     No   Fall or balance concerns reported    No   Warm-up and Cool-down Performed on first and last piece of equipment   Resistance Training Performed Yes   VAD Patient? No     Pain Assessment   Currently in Pain? No/denies   Multiple Pain Sites No         Goals Met:  Independence with exercise equipment Exercise tolerated well No report of cardiac concerns or symptoms Strength training completed today  Goals Unmet:  Not Applicable  Comments: Pt able to follow exercise prescription today without complaint.  Will continue to monitor for progression.    Dr. Emily Filbert is Medical Director for [place] and [name].

## 2016-11-23 ENCOUNTER — Encounter: Payer: Medicare Other | Admitting: *Deleted

## 2016-11-23 DIAGNOSIS — Z951 Presence of aortocoronary bypass graft: Secondary | ICD-10-CM

## 2016-11-23 DIAGNOSIS — Z029 Encounter for administrative examinations, unspecified: Secondary | ICD-10-CM | POA: Diagnosis not present

## 2016-11-23 NOTE — Progress Notes (Signed)
Daily Session Note  Patient Details  Name: Jason [name] MRN: [id number] Date of Birth: October 24, 1951 Referring Provider:   Flowsheet Row Cardiac Rehab from 09/28/2016 in [facility] Cardiac and Pulmonary Rehab  Referring Provider  Serafina Royals MD      Encounter Date: 11/23/2016  Check In:     Session Check In - 11/23/16 0740      Check-In   Location [facility]-Cardiac & Pulmonary Rehab   Staff Present Alberteen Sam, MA, ACSM RCEP, Exercise Physiologist;Other;Kelly Amedeo Plenty, BS, ACSM CEP, Exercise Physiologist;Carroll Enterkin, RN, BSN   Supervising physician immediately available to respond to emergencies See telemetry face sheet for immediately available ER MD   Medication changes reported     No   Fall or balance concerns reported    No   Warm-up and Cool-down Performed on first and last piece of equipment   Resistance Training Performed Yes   VAD Patient? No     Pain Assessment   Currently in Pain? No/denies   Multiple Pain Sites No         Goals Met:  Independence with exercise equipment Exercise tolerated well No report of cardiac concerns or symptoms Strength training completed today  Goals Unmet:  Not Applicable  Comments: Pt able to follow exercise prescription today without complaint.  Will continue to monitor for progression.     [place] Name 09/28/16 1457 11/23/16 0844       6 Minute Walk   Phase Initial Discharge    Distance 1210 feet 1440 feet    Distance % Change  - 19 %  230 ft    Walk Time 6 minutes 6 minutes    # of Rest Breaks 0 0    MPH 2.29 2.73    METS 2.78 3.1    RPE 11 13    Perceived Dyspnea  2 1    VO2 Peak 9.76 10.84    Symptoms Yes (comment) Yes (comment)    Comments chronic knee pain 6/10, SOB SOB    Resting HR 90 bpm 88 bpm    Resting BP 136/66 126/64    Max Ex. HR 121 bpm 112 bpm    Max Ex. BP 136/64 136/70    2 Minute Post BP 124/62  -         Dr. Emily Filbert is Medical Director for [name] and [name].

## 2016-11-25 ENCOUNTER — Encounter: Payer: Self-pay | Admitting: *Deleted

## 2016-11-25 DIAGNOSIS — Z951 Presence of aortocoronary bypass graft: Secondary | ICD-10-CM

## 2016-11-25 DIAGNOSIS — Z029 Encounter for administrative examinations, unspecified: Secondary | ICD-10-CM | POA: Diagnosis not present

## 2016-11-25 NOTE — Progress Notes (Signed)
Daily Session Note  Patient Details  Name: EDYN QAZI MRN: [id number] Date of Birth: September 09, 1951 Referring Provider:   Flowsheet Row Cardiac Rehab from 09/28/2016 in [facility] Cardiac and Pulmonary Rehab  Referring Provider  Serafina Royals MD      Encounter Date: 11/25/2016  Check In:     Session Check In - 11/25/16 0858      Check-In   Location [facility]-Cardiac & Pulmonary Rehab   Staff Present Alberteen Sam, MA, ACSM RCEP, Exercise Physiologist;Mary Kellie Shropshire, RN, BSN, Kela Millin, BA, ACSM CEP, Exercise Physiologist   Supervising physician immediately available to respond to emergencies See telemetry face sheet for immediately available ER MD   Medication changes reported     No   Fall or balance concerns reported    No   Warm-up and Cool-down Performed on first and last piece of equipment   Resistance Training Performed Yes   VAD Patient? No     Pain Assessment   Currently in Pain? No/denies   Multiple Pain Sites No         Goals Met:  Independence with exercise equipment Exercise tolerated well No report of cardiac concerns or symptoms Strength training completed today  Goals Unmet:  Not Applicable  Comments: Pt able to follow exercise prescription today without complaint.  Will continue to monitor for progression.    Dr. Emily Filbert is Medical Director for [place] and [name].

## 2016-11-25 NOTE — Progress Notes (Signed)
Cardiac Individual Treatment Plan  Patient Details  Name: Jason [name] MRN: [id number] Date of Birth: 29-Dec-1950 Referring Provider:   Flowsheet Row Cardiac Rehab from 09/28/2016 in [facility] Cardiac and Pulmonary Rehab  Referring Provider  Serafina Royals MD      Initial Encounter Date:  Flowsheet Row Cardiac Rehab from 09/28/2016 in [facility] Cardiac and Pulmonary Rehab  Date  09/28/16  Referring Provider  Serafina Royals MD      Visit Diagnosis: S/P [name]  Patient's Home Medications on Admission:  Current Outpatient Prescriptions:  .  atorvastatin (LIPITOR) 80 MG tablet, Take 1 tablet (80 mg total) by mouth daily at 6 PM., Disp: 30 tablet, Rfl: 1 .  canagliflozin (INVOKANA) 300 MG TABS tablet, Take 300 mg by mouth daily before breakfast. Reported on 04/13/2016, Disp: , Rfl:  .  carvedilol (COREG) 6.25 MG tablet, Take 1 tablet (6.25 mg total) by mouth 2 (two) times daily with a meal., Disp: 60 tablet, Rfl: 1 .  clopidogrel (PLAVIX) 75 MG tablet, Take 1 tablet (75 mg total) by mouth daily., Disp: 30 tablet, Rfl: 1 .  gemfibrozil (LOPID) 600 MG tablet, Take 600 mg by mouth 2 (two) times daily before a meal., Disp: , Rfl:  .  glimepiride (AMARYL) 4 MG tablet, Take 4 mg by mouth 2 (two) times daily. Reported on 04/13/2016, Disp: , Rfl:  .  losartan-hydrochlorothiazide (HYZAAR) 100-25 MG tablet, Take 0.5 tablets by mouth every evening., Disp: 30 tablet, Rfl: 1 .  metFORMIN (GLUCOPHAGE-XR) 750 MG 24 hr tablet, Take 750 mg by mouth daily with breakfast. Reported on 04/13/2016, Disp: , Rfl:   Past Medical History: Past Medical History:  Diagnosis Date  . Arthritis   . 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 Cough    CHRONIC  . Diabetes mellitus without complication ([place])   . Edema    FEET/LEGS  . Essential hypertension   . GERD (gastroesophageal reflux disease)   . Hypertension   . Left main coronary artery disease 04/13/2016   . Mixed hyperlipidemia   . Obesity   . 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 Type II diabetes mellitus ([place])   . Unstable angina ([place]) 04/13/2016    Tobacco Use: History  Smoking Status  . Never Smoker  Smokeless Tobacco  . Current User  . Types: Chew    Labs: Recent Review Flowsheet Data    Labs for ITP Cardiac and Pulmonary Rehab Latest Ref Rng & Units 04/14/2016 04/14/2016 04/14/2016 04/15/2016 04/23/2016   Cholestrol 0 - 200 mg/dL - - - - -   LDLCALC 0 - 99 mg/dL - - - - -   HDL >40 mg/dL - - - - -   Trlycerides <150 mg/dL - - - - 135   Hemoglobin A1c 4.8 - 5.6 % - - - - -   PHART 7.350 - 7.450 7.403 7.328(L) - - -   PCO2ART 35.0 - 45.0 mmHg 40.6 47.5(H) - - -   HCO3 20.0 - 24.0 mEq/L 25.4(H) 25.0(H) - - -   TCO2 0 - 100 mmol/L _0 -   ACIDBASEDEF 0.0 - 2.0 mmol/L - 1.0 - - -   O2SAT % 98.0 91.0 - - -       Exercise Target Goals:    Exercise Program Goal: Individual exercise prescription  set with THRR, safety & activity barriers. Participant demonstrates ability to understand and report RPE using BORG scale, to self-measure pulse accurately, and to acknowledge the importance of the exercise prescription.  Exercise Prescription Goal: Starting with aerobic activity 30 plus minutes a day, 3 days per week for initial exercise prescription. Provide home exercise prescription and guidelines that participant acknowledges understanding prior to discharge.  Activity Barriers & Risk Stratification:     Activity Barriers & Cardiac Risk Stratification - 09/28/16 1500      Activity Barriers & Cardiac Risk Stratification   Activity Barriers Arthritis;Back Problems;Balance Concerns;Joint Problems;Deconditioning;Shortness of Breath;Muscular Weakness  herniated disc L5 L6 surfery in past, both knees arthritis   Cardiac Risk Stratification High      6 Minute Walk:     6 Minute Walk    Row Name 09/28/16  1457 11/23/16 0844       6 Minute Walk   Phase Initial Discharge    Distance 1210 feet 1440 feet    Distance % Change  - 19 %  230 ft    Walk Time 6 minutes 6 minutes    # of Rest Breaks 0 0    MPH 2.29 2.73    METS 2.78 3.1    RPE 11 13    Perceived Dyspnea  2 1    VO2 Peak 9.76 10.84    Symptoms Yes (comment) Yes (comment)    Comments chronic knee pain 6/10, SOB SOB    Resting HR 90 bpm 88 bpm    Resting BP 136/66 126/64    Max Ex. HR 121 bpm 112 bpm    Max Ex. BP 136/64 136/70    2 Minute Post BP 124/62  -       Initial Exercise Prescription:     Initial Exercise Prescription - 09/28/16 1400      Date of Initial Exercise RX and Referring Provider   Date 09/28/16   Referring Provider Serafina Royals MD     Treadmill   MPH 2.2   Grade 0.5   Minutes 15   METs 2.84     NuStep   Level 3   Watts --  80-100 spm   Minutes 15   METs 2     REL-XR   Level 2   Watts --  speed 50   Minutes 15   METs 2     Prescription Details   Frequency (times per week) 3   Duration Progress to 45 minutes of aerobic exercise without signs/symptoms of physical distress     Intensity   THRR 40-80% of Max Heartrate 116-141   Ratings of Perceived Exertion 11-15   Perceived Dyspnea 0-4     Progression   Progression Continue to progress workloads to maintain intensity without signs/symptoms of physical distress.     Resistance Training   Training Prescription Yes   Weight 4 lbs   Reps 10-12      Perform Capillary Blood Glucose checks as needed.  Exercise Prescription Changes:     Exercise Prescription Changes    Row Name 09/28/16 1400 10/07/16 0800 10/07/16 1400 10/21/16 1500 11/04/16 1400     Exercise Review   Progression -  walk test results Yes - Yes Yes     Response to Exercise   Blood Pressure (Admit) 136/66 126/72  - 122/60 120/64   Blood Pressure (Exercise) 136/64 144/80  - 128/74 148/84   Blood Pressure (Exit) 124/62 120/72  - 138/80 122/64   Heart  Rate (Admit) 90 bpm 55 bpm  - 70 bpm 94 bpm   Heart Rate (Exercise) 121 bpm 115 bpm  - 133 bpm 125 bpm   Heart Rate (Exit) 93 bpm 101 bpm  - 94 bpm 92 bpm   Oxygen Saturation (Admit) 96 %  -  -  -  -   Rating of Perceived Exertion (Exercise) 11 12  - 13 13   Symptoms knee pain 6/10 and SOB none  - none none   Comments  - Home Exercise Guidelines given 10/07/16  - Home Exercise Guidelines given 10/07/16 Home Exercise Guidelines given 10/07/16   Duration  - Progress to 45 minutes of aerobic exercise without signs/symptoms of physical distress  - Progress to 45 minutes of aerobic exercise without signs/symptoms of physical distress Progress to 45 minutes of aerobic exercise without signs/symptoms of physical distress   Intensity  - THRR unchanged  - THRR unchanged THRR unchanged     Progression   Progression  - Continue to progress workloads to maintain intensity without signs/symptoms of physical distress.  - Continue to progress workloads to maintain intensity without signs/symptoms of physical distress. Continue to progress workloads to maintain intensity without signs/symptoms of physical distress.   Average METs  - 2.95  - 2.52 3.6     Resistance Training   Training Prescription  - Yes  - Yes Yes   Weight  - 4 lbs  - 4 lbs 5 lbs   Reps  - 10-12  - 10-12 10-15     Interval Training   Interval Training  - No  - No No     Treadmill   MPH  - 2.2  - 2.5 2.5   Grade  - 0.5  - 0.5 0.5   Minutes  - 15  - 15 15   METs  - 2.84  - 3.26 3.09     NuStep   Level  - 3  - 3 5   Minutes  - 15  - 15 15   METs  - 3.9  - 2.6 3.5     REL-XR   Level  - 2  - 4 4   Minutes  - 15  - 15 15   METs  - 2.1  - 1.7 4.2     Home Exercise Plan   Plans to continue exercise at  - Home  - Home  walk and bike Home  walk and bike   Frequency  - Add 2 additional days to program exercise sessions.  - Add 2 additional days to program exercise sessions. Add 2 additional days to program exercise sessions.   Row  Name 11/17/16 1500             Exercise Review   Progression Yes         Response to Exercise   Blood Pressure (Admit) 132/84       Blood Pressure (Exercise) 136/74       Blood Pressure (Exit) 120/60       Heart Rate (Admit) 87 bpm       Heart Rate (Exercise) 118 bpm       Heart Rate (Exit) 91 bpm       Rating of Perceived Exertion (Exercise) 13       Symptoms none       Comments Home Exercise Guidelines given 10/07/16       Duration Progress to 45 minutes of aerobic exercise without signs/symptoms of physical distress  Intensity THRR unchanged         Progression   Progression Continue to progress workloads to maintain intensity without signs/symptoms of physical distress.       Average METs 3.69         Resistance Training   Training Prescription Yes       Weight 6 lbs       Reps 10-15         Interval Training   Interval Training No         Treadmill   MPH 2.5       Grade 0.5       Minutes 15       METs 3.09         NuStep   Level 5       Minutes 15       METs 5.3         REL-XR   Level 4       Minutes 15       METs 2.5         Home Exercise Plan   Plans to continue exercise at Home  walk and bike       Frequency Add 2 additional days to program exercise sessions.          Exercise Comments:     Exercise Comments    Row Name 09/28/16 1500 09/30/16 0857 10/07/16 0840 10/07/16 1456 10/21/16 1540   Exercise Comments Primary goal is to increase stamina. First full day of exercise!  Patient was oriented to gym and equipment including functions, settings, policies, and procedures.  Patient's individual exercise prescription and treatment plan were reviewed.  All starting workloads were established based on the results of the 6 minute walk test done at initial orientation visit.  The plan for exercise progression was also introduced and progression will be customized based on patient's performance and goals. Reviewed home exercise with pt today.  Pt  plans to walk/bike at home for exercise.  Reviewed THR, pulse, RPE, sign and symptoms, NTG use, and when to call 911 or MD.  Also discussed weather considerations and indoor options.  Pt voiced understanding. Ching is off to a great start with rehab.  Today he did take it a little easier as he was not feeling very good with a head cold.  We will continue to monitor his progression. Zian continues to do well with exercise.  He is starting to feel better from his head cold.  He is now back to his regular workloads.  We will continue to monitor his progression.   [place] Name 10/23/16 [phone] 11/04/16 1401 11/09/16 0847 11/17/16 1501 11/23/16 0846   Exercise Comments Reviewed METs average and discussed progression with pt today. Yazeed is starting to feel better from his upper respiratory infection.  He is up to 4.0 METs on the XR.  We will continue to monitor his progression. Reviewed METs average and discussed progression with pt today. Azam continues to do well in rehab.  He is up to over 5 METs on the NuStep.  We will continue to track his progress. Ewart improved his walk test by 16%!!      Discharge Exercise Prescription (Final Exercise Prescription Changes):     Exercise Prescription Changes - 11/17/16 1500      Exercise Review   Progression Yes     Response to Exercise   Blood Pressure (Admit) 132/84   Blood Pressure (Exercise) 136/74   Blood Pressure (Exit)  120/60   Heart Rate (Admit) 87 bpm   Heart Rate (Exercise) 118 bpm   Heart Rate (Exit) 91 bpm   Rating of Perceived Exertion (Exercise) 13   Symptoms none   Comments Home Exercise Guidelines given 10/07/16   Duration Progress to 45 minutes of aerobic exercise without signs/symptoms of physical distress   Intensity THRR unchanged     Progression   Progression Continue to progress workloads to maintain intensity without signs/symptoms of physical distress.   Average METs 3.69     Resistance Training   Training Prescription Yes   Weight 6  lbs   Reps 10-15     Interval Training   Interval Training No     Treadmill   MPH 2.5   Grade 0.5   Minutes 15   METs 3.09     NuStep   Level 5   Minutes 15   METs 5.3     REL-XR   Level 4   Minutes 15   METs 2.5     Home Exercise Plan   Plans to continue exercise at Home  walk and bike   Frequency Add 2 additional days to program exercise sessions.      Nutrition:  Target Goals: Understanding of nutrition guidelines, daily intake of sodium '1500mg'$ , cholesterol '200mg'$ , calories 30% from fat and 7% or less from saturated fats, daily to have 5 or more servings of fruits and vegetables.  Biometrics:     Pre Biometrics - 09/28/16 1502      Pre Biometrics   Height 6' 3.5" (1.918 m)   Weight 282 lb 12.8 oz (128.3 kg)   Waist Circumference 46.75 inches   Hip Circumference 45.5 inches   Waist to Hip Ratio 1.03 %   BMI (Calculated) 35   Single Leg Stand 3.59 seconds       Nutrition Therapy Plan and Nutrition Goals:     Nutrition Therapy & Goals - 10/09/16 1218      Nutrition Therapy   Diet Instructed on a heart healthy meal plan based on 2000 calories and including dietary guidelines for diabetes.   Drug/Food Interactions Statins/Certain Fruits   Fiber 30 grams   Whole Grain Foods 8 servings   Saturated Fats 14 max. grams   Fruits and Vegetables 5 servings/day   Sodium 2000 grams  1500 ideal     Personal Nutrition Goals   Personal Goal #1 Work on better portion control. Refer to hand-out for servings of carbohydrate.   Personal Goal #2 Try Mueller's brand 100% whole wheat pasts. Also increase whole grains with brown rice/wild rice and oatmeal.    Personal Goal #3 Read labels for saturated fat, trans fat and sodium.     Intervention Plan   Intervention Prescribe, educate and counsel regarding individualized specific dietary modifications aiming towards targeted core components such as weight, hypertension, lipid management, diabetes, heart failure and other  comorbidities.;Nutrition handout(s) given to patient.   Expected Outcomes Short Term Goal: Understand basic principles of dietary content, such as calories, fat, sodium, cholesterol and nutrients.;Short Term Goal: A plan has been developed with personal nutrition goals set during dietitian appointment.;Long Term Goal: Adherence to prescribed nutrition plan.      Nutrition Discharge: Rate Your Plate Scores:     Nutrition Assessments - 11/09/16 0901      Rate Your Plate Scores   Pre Score 70   Pre Score % 77.8 %   Post Score 71   Post Score % 78.9 %   %  Change 1.1 %      Nutrition Goals Re-Evaluation:     Nutrition Goals Re-Evaluation    [place] Name 11/02/16 0851             Personal Goal #1 Re-Evaluation   Personal Goal #1 Work on better portion control. Refer to hand-out for servings of carbohydrate.       Goal Progress Seen Yes       Comments Keyante has been watching his portion sizes a little more than he had prior to his appt.         Personal Goal #2 Re-Evaluation   Personal Goal #2 Try Mueller's brand 100% whole wheat pasts. Also increase whole grains with brown rice/wild rice and oatmeal.        Goal Progress Seen Yes       Comments They do not eat a lot of pasta, but he is getting brown rice more frquently.         Personal Goal #3 Re-Evaluation   Personal Goal #3 Read labels for saturated fat, trans fat and sodium.       Goal Progress Seen Met       Comments Rielly has been reading labels routinely.         Weight   Current Weight 280 lb 6.4 oz (127.2 kg)         Intervention Plan   Comments Bertie will continue to watch his portion sizes and choices to work on weight control.          Psychosocial: Target Goals: Acknowledge presence or absence of depression, maximize coping skills, provide positive support system. Participant is able to verbalize types and ability to use techniques and skills needed for reducing stress and depression.  Initial Review &  Psychosocial Screening:     Initial Psych Review & Screening - 09/28/16 1458      Initial Review   Current issues with --  none reported     Family Dynamics   Good Support System? Yes  wife good friend/neighbor     Barriers   Psychosocial barriers to participate in program There are no identifiable barriers or psychosocial needs.;The patient should benefit from training in stress management and relaxation.     Screening Interventions   Interventions Encouraged to exercise      Quality of Life Scores:     Quality of Life - 11/09/16 0901      Quality of Life Scores   Health/Function Pre 25.07 %   Health/Function Post 25.83 %   Health/Function % Change 3.03 %   Socioeconomic Pre 28.5 %   Socioeconomic Post 27.93 %   Socioeconomic % Change  -2 %   Psych/Spiritual Pre 28.29 %   Psych/Spiritual Post 27.43 %   Psych/Spiritual % Change -3.04 %   Family Pre 28.5 %   Family Post 27 %   Family % Change -5.26 %   GLOBAL Pre 26.94 %   GLOBAL Post 26.76 %   GLOBAL % Change -0.67 %      PHQ-9: Recent Review Flowsheet Data    Depression screen [facility] 2/9 11/09/2016 09/28/2016   Decreased Interest 0 0   Down, Depressed, Hopeless 0 0   PHQ - 2 Score 0 0   Altered sleeping 0 0   Tired, decreased energy 1 1   Change in appetite 0 0   Feeling bad or failure about yourself  0 1   Trouble concentrating 0 0   Moving slowly or  fidgety/restless 0 0   Suicidal thoughts 0 0   PHQ-9 Score 1 2   Difficult doing work/chores Somewhat difficult Not difficult at all      Psychosocial Evaluation and Intervention:     Psychosocial Evaluation - 10/28/16 0910      Psychosocial Evaluation & Interventions   Interventions Encouraged to exercise with the program and follow exercise prescription   Comments Counselor met with Mr. Faulkenberry for initial psychosocial evaluation.  He is a 66 year old who had quadruple by-pass surgery this past June.  He has a strong support system with a spouse of 41  years and he's also actively involved in his local church.  Mr. Lemmie Evens  reports other than his heart, he is in pretty good health; with the exception of arthritis in his knees.  He sleeps well and has a good appetite.  He denies a history of depression or anxiety and states he is typically in a positive mood.  His health is his biggest stressor and realizing he is unable to do the things he used to enjoy.  Mr. Lemmie Evens has goals for this program to increase his stamina and strength.  Counselor encouraged him to begin researching follow up programs to consistently exercise once he completes this program.  Staff will be following with Mr. Lemmie Evens throughout the course of this program.        Psychosocial Re-Evaluation:     Psychosocial Re-Evaluation    Row Name 10/09/16 0809 11/02/16 0853           Psychosocial Re-Evaluation   Interventions Encouraged to attend Cardiac Rehabilitation for the exercise Encouraged to attend Cardiac Rehabilitation for the exercise      Comments Chapman states he is doing well, no depression no excess stress.   He has stated that the exercise in the program is definitely helping him with stamina and felling better. Rannie has been doing well with his mood and stress.  Friday last week he was upset that he was not able to get here due to the weather.  He does not like to feel like he is letting someone down.  He also got upset when we were not able to return his call on Thursday afternoon after we had left for the day, but once we explained what had happened he happily accepted what had happend.  Pearlie will aim to remain positive          Vocational Rehabilitation: Provide vocational rehab assistance to qualifying candidates.   Vocational Rehab Evaluation & Intervention:     Vocational Rehab - 09/28/16 1501      Initial Vocational Rehab Evaluation & Intervention   Assessment shows need for Vocational Rehabilitation No      Education: Education Goals: Education classes will be  provided on a weekly basis, covering required topics. Participant will state understanding/return demonstration of topics presented.  Learning Barriers/Preferences:     Learning Barriers/Preferences - 09/28/16 1501      Learning Barriers/Preferences   Learning Barriers None   Learning Preferences Skilled Demonstration;Written Material      Education Topics: General Nutrition Guidelines/Fats and Fiber: -Group instruction provided by verbal, written material, models and posters to present the general guidelines for heart healthy nutrition. Gives an explanation and review of dietary fats and fiber. Flowsheet Row Cardiac Rehab from 11/23/2016 in [facility] Cardiac and Pulmonary Rehab  Date  11/02/16  Educator  CR  Instruction Review Code  2- meets goals/outcomes      Controlling Sodium/Reading  Food Labels: -Group verbal and written material supporting the discussion of sodium use in heart healthy nutrition. Review and explanation with models, verbal and written materials for utilization of the food label. Flowsheet Row Cardiac Rehab from 11/23/2016 in [facility] Cardiac and Pulmonary Rehab  Date  11/09/16  Educator  CR  Instruction Review Code  2- meets goals/outcomes      Exercise Physiology & Risk Factors: - Group verbal and written instruction with models to review the exercise physiology of the cardiovascular system and associated critical values. Details cardiovascular disease risk factors and the goals associated with each risk factor. Flowsheet Row Cardiac Rehab from 11/23/2016 in [facility] Cardiac and Pulmonary Rehab  Date  11/16/16  Educator  [facility]  Instruction Review Code  2- meets goals/outcomes      Aerobic Exercise & Resistance Training: - Gives group verbal and written discussion on the health impact of inactivity. On the components of aerobic and resistive training programs and the benefits of this training and how to safely progress through these programs. Flowsheet Row Cardiac Rehab  from 11/23/2016 in [facility] Cardiac and Pulmonary Rehab  Date  11/18/16  Educator  [facility]  Instruction Review Code  2- meets goals/outcomes      Flexibility, Balance, General Exercise Guidelines: - Provides group verbal and written instruction on the benefits of flexibility and balance training programs. Provides general exercise guidelines with specific guidelines to those with heart or lung disease. Demonstration and skill practice provided. Flowsheet Row Cardiac Rehab from 11/23/2016 in [facility] Cardiac and Pulmonary Rehab  Date  11/23/16  Educator  [facility]  Instruction Review Code  2- meets goals/outcomes      Stress Management: - Provides group verbal and written instruction about the health risks of elevated stress, cause of high stress, and healthy ways to reduce stress. Flowsheet Row Cardiac Rehab from 11/23/2016 in [facility] Cardiac and Pulmonary Rehab  Date  10/07/16  Educator  [facility]  Instruction Review Code  2- meets goals/outcomes      Depression: - Provides group verbal and written instruction on the correlation between heart/lung disease and depressed mood, treatment options, and the stigmas associated with seeking treatment. Flowsheet Row Cardiac Rehab from 11/23/2016 in [facility] Cardiac and Pulmonary Rehab  Date  11/04/16  Educator  [facility]  Instruction Review Code  2- meets goals/outcomes      Anatomy & Physiology of the Heart: - Group verbal and written instruction and models provide basic cardiac anatomy and physiology, with the coronary electrical and arterial systems. Review of: AMI, Angina, Valve disease, Heart Failure, Cardiac Arrhythmia, Pacemakers, and the ICD.   Cardiac Procedures: - Group verbal and written instruction and models to describe the testing methods done to diagnose heart disease. Reviews the outcomes of the test results. Describes the treatment choices: Medical Management, Angioplasty, or Coronary Bypass Surgery. Flowsheet Row Cardiac Rehab from 11/23/2016 in [facility] Cardiac and  Pulmonary Rehab  Date  10/05/16  Educator  CE  Instruction Review Code  2- meets goals/outcomes      Cardiac Medications: - Group verbal and written instruction to review commonly prescribed medications for heart disease. Reviews the medication, class of the drug, and side effects. Includes the steps to properly store meds and maintain the prescription regimen. Flowsheet Row Cardiac Rehab from 11/23/2016 in [facility] Cardiac and Pulmonary Rehab  Date  10/14/16 Marisue Humble 2]  Educator  TS  Instruction Review Code  2- meets goals/outcomes      Go Sex-Intimacy & Heart Disease, Get SMART - Goal  Setting: - Group verbal and written instruction through game format to discuss heart disease and the return to sexual intimacy. Provides group verbal and written material to discuss and apply goal setting through the application of the S.M.A.R.T. Method. Flowsheet Row Cardiac Rehab from 11/23/2016 in [facility] Cardiac and Pulmonary Rehab  Date  10/05/16  Educator  CE  Instruction Review Code  2- meets goals/outcomes      Other Matters of the Heart: - Provides group verbal, written materials and models to describe Heart Failure, Angina, Valve Disease, and Diabetes in the realm of heart disease. Includes description of the disease process and treatment options available to the cardiac patient.   Exercise & Equipment Safety: - Individual verbal instruction and demonstration of equipment use and safety with use of the equipment. Flowsheet Row Cardiac Rehab from 11/23/2016 in [facility] Cardiac and Pulmonary Rehab  Date  09/28/16  Educator  SB  Instruction Review Code  2- meets goals/outcomes      Infection Prevention: - Provides verbal and written material to individual with discussion of infection control including proper hand washing and proper equipment cleaning during exercise session. Flowsheet Row Cardiac Rehab from 11/23/2016 in [facility] Cardiac and Pulmonary Rehab  Date  09/28/16  Educator  SB  Instruction  Review Code  2- meets goals/outcomes      Falls Prevention: - Provides verbal and written material to individual with discussion of falls prevention and safety. Flowsheet Row Cardiac Rehab from 11/23/2016 in [facility] Cardiac and Pulmonary Rehab  Date  09/28/16  Educator  SB  Instruction Review Code  2- meets goals/outcomes      Diabetes: - Individual verbal and written instruction to review signs/symptoms of diabetes, desired ranges of glucose level fasting, after meals and with exercise. Advice that pre and post exercise glucose checks will be done for 3 sessions at entry of program. Flowsheet Row Cardiac Rehab from 11/23/2016 in [facility] Cardiac and Pulmonary Rehab  Date  09/28/16  Educator  SB  Instruction Review Code  2- meets goals/outcomes       Knowledge Questionnaire Score:     Knowledge Questionnaire Score - 11/09/16 0901      Knowledge Questionnaire Score   Pre Score 25/28   Post Score 28/28      Core Components/Risk Factors/Patient Goals at Admission:     Personal Goals and Risk Factors at Admission - 09/28/16 1455      Core Components/Risk Factors/Patient Goals on Admission    Weight Management Obesity;Weight Maintenance;Yes   Intervention Weight Management: Develop a combined nutrition and exercise program designed to reach desired caloric intake, while maintaining appropriate intake of nutrient and fiber, sodium and fats, and appropriate energy expenditure required for the weight goal.   Admit Weight 282 lb 12.8 oz (128.3 kg)   Goal Weight: Short Term 280 lb (127 kg)   Goal Weight: Long Term 235 lb (106.6 kg)   Expected Outcomes Short Term: Continue to assess and modify interventions until short term weight is achieved;Long Term: Adherence to nutrition and physical activity/exercise program aimed toward attainment of established weight goal;Weight Loss: Understanding of general recommendations for a balanced deficit meal plan, which promotes 1-2 lb weight loss per  week and includes a negative energy balance of [phone] kcal/d   Sedentary Yes   Intervention Provide advice, education, support and counseling about physical activity/exercise needs.;Develop an individualized exercise prescription for aerobic and resistive training based on initial evaluation findings, risk stratification, comorbidities and participant's personal goals.   Expected Outcomes  Achievement of increased cardiorespiratory fitness and enhanced flexibility, muscular endurance and strength shown through measurements of functional capacity and personal statement of participant.   Increase Strength and Stamina Yes   Intervention Provide advice, education, support and counseling about physical activity/exercise needs.;Develop an individualized exercise prescription for aerobic and resistive training based on initial evaluation findings, risk stratification, comorbidities and participant's personal goals.   Expected Outcomes Achievement of increased cardiorespiratory fitness and enhanced flexibility, muscular endurance and strength shown through measurements of functional capacity and personal statement of participant.   Diabetes Yes   Intervention Provide education about signs/symptoms and action to take for hypo/hyperglycemia.;Provide education about proper nutrition, including hydration, and aerobic/resistive exercise prescription along with prescribed medications to achieve blood glucose in normal ranges: Fasting glucose 65-99 mg/dL   Expected Outcomes Short Term: Participant verbalizes understanding of the signs/symptoms and immediate care of hyper/hypoglycemia, proper foot care and importance of medication, aerobic/resistive exercise and nutrition plan for blood glucose control.;Long Term: Attainment of HbA1C < 7%.   Hypertension Yes   Intervention Provide education on lifestyle modifcations including regular physical activity/exercise, weight management, moderate sodium restriction and  increased consumption of fresh fruit, vegetables, and low fat dairy, alcohol moderation, and smoking cessation.;Monitor prescription use compliance.   Expected Outcomes Short Term: Continued assessment and intervention until BP is < 140/57m HG in hypertensive participants. < 130/87mHG in hypertensive participants with diabetes, heart failure or chronic kidney disease.;Long Term: Maintenance of blood pressure at goal levels.   Lipids Yes   Intervention Provide education and support for participant on nutrition & aerobic/resistive exercise along with prescribed medications to achieve LDL '70mg'$ , HDL >'40mg'$ .   Expected Outcomes Short Term: Participant states understanding of desired cholesterol values and is compliant with medications prescribed. Participant is following exercise prescription and nutrition guidelines.;Long Term: Cholesterol controlled with medications as prescribed, with individualized exercise RX and with personalized nutrition plan. Value goals: LDL < '70mg'$ , HDL > 40 mg.      Core Components/Risk Factors/Patient Goals Review:      Goals and Risk Factor Review    Row Name 10/09/16 0813 11/02/16 0848           Core Components/Risk Factors/Patient Goals Review   Personal Goals Review Weight Management/Obesity;Lipids;Hypertension;Diabetes Weight Management/Obesity;Lipids;Hypertension;Diabetes;Sedentary;Increase Strength and Stamina      Review JoTaymas gained the 2 pounds he has lost since starting program.  He has been exercising  in class and active at home . He has RD appt today and expects to learn more to work on nutrition.  HAs attended diabetes program and is already following guidelines.  Blood sugar average 124.  BP reading are in good range.  JoVesteras been doing well in rehab.  He has been more active at home and around the shop, but he is not taking the time to go for a walk to get his cardio.  His weight has been steady (280.4 today), but he would still like to lose more  weight.  His blood sugars and blood pressures have been good.  He has not had any problems with his medications.        Expected Outcomes Continue to work on risk factor control with the exrcise, nutriton and meds prescribed.  Short: JoRaunels going to add in some extra walking at home. Long: JoAveriill continue to exercise and work on diet to lose more weight.         Core Components/Risk Factors/Patient Goals at Discharge (Final Review):  Goals and Risk Factor Review - 11/02/16 0848      Core Components/Risk Factors/Patient Goals Review   Personal Goals Review Weight Management/Obesity;Lipids;Hypertension;Diabetes;Sedentary;Increase Strength and Stamina   Review Ovie has been doing well in rehab.  He has been more active at home and around the shop, but he is not taking the time to go for a walk to get his cardio.  His weight has been steady (280.4 today), but he would still like to lose more weight.  His blood sugars and blood pressures have been good.  He has not had any problems with his medications.     Expected Outcomes Short: Claudy is going to add in some extra walking at home. Long: Henley will continue to exercise and work on diet to lose more weight.      ITP Comments:     ITP Comments    Row Name 09/28/16 1451 09/30/16 0703 10/27/16 0601 11/25/16 0601     ITP Comments Medical rview completed. Initial ITP created  DIagnosis documentation can be found [facility] encounter 05/25/2016 30 day review completed for review by Dr Emily Filbert.  Continue with ITP unless changes noted by Dr Sabra Heck. New to program 30 day review. Continue with ITP unless directed changes per Medical Director review. 30 day review. Continue with ITP unless directed changes per Medical Director review.       Comments:

## 2016-11-27 ENCOUNTER — Encounter: Payer: Medicare Other | Attending: Thoracic Surgery (Cardiothoracic Vascular Surgery) | Admitting: *Deleted

## 2016-11-27 DIAGNOSIS — Z951 Presence of aortocoronary bypass graft: Secondary | ICD-10-CM | POA: Diagnosis not present

## 2016-11-27 DIAGNOSIS — Z79899 Other long term (current) drug therapy: Secondary | ICD-10-CM | POA: Insufficient documentation

## 2016-11-27 DIAGNOSIS — I25110 Atherosclerotic heart disease of native coronary artery with unstable angina pectoris: Secondary | ICD-10-CM | POA: Insufficient documentation

## 2016-11-27 NOTE — Progress Notes (Signed)
Daily Session Note  Patient Details  Name: Jason [name] MRN: [id number] Date of Birth: February 11, 1951 Referring Provider:   Flowsheet Row Cardiac Rehab from 09/28/2016 in [facility] Cardiac and Pulmonary Rehab  Referring Provider  Serafina Royals MD      Encounter Date: 11/27/2016  Check In:     Session Check In - 11/27/16 0830      Check-In   Location [facility]-Cardiac & Pulmonary Rehab   Staff Present Heath Lark, RN, BSN, CCRP;Monigue Spraggins, RN, Levie Heritage, MA, ACSM RCEP, Exercise Physiologist   Supervising physician immediately available to respond to emergencies See telemetry face sheet for immediately available ER MD   Medication changes reported     No   Fall or balance concerns reported    No   Warm-up and Cool-down Performed on first and last piece of equipment   Resistance Training Performed Yes   VAD Patient? No     Pain Assessment   Currently in Pain? No/denies         Goals Met:  Proper associated with RPD/PD & O2 Sat Exercise tolerated well  Goals Unmet:  Not Applicable  Comments:     Dr. Emily Filbert is Medical Director for [place] and [name].

## 2016-11-30 ENCOUNTER — Encounter: Payer: Medicare Other | Admitting: *Deleted

## 2016-11-30 DIAGNOSIS — Z79899 Other long term (current) drug therapy: Secondary | ICD-10-CM | POA: Diagnosis not present

## 2016-11-30 DIAGNOSIS — Z951 Presence of aortocoronary bypass graft: Secondary | ICD-10-CM

## 2016-11-30 DIAGNOSIS — I25110 Atherosclerotic heart disease of native coronary artery with unstable angina pectoris: Secondary | ICD-10-CM | POA: Diagnosis not present

## 2016-11-30 NOTE — Progress Notes (Signed)
Daily Session Note  Patient Details  Name: Jason [name] MRN: [id number] Date of Birth: 1951-08-29 Referring Provider:   Flowsheet Row Cardiac Rehab from 09/28/2016 in [facility] Cardiac and Pulmonary Rehab  Referring Provider  Serafina Royals MD      Encounter Date: 11/30/2016  Check In:     Session Check In - 11/30/16 0920      Check-In   Location [facility]-Cardiac & Pulmonary Rehab   Staff Present Alberteen Sam, MA, ACSM RCEP, Exercise Physiologist;Mary Kellie Shropshire, RN, BSN, Bonnita [place], BS, ACSM CEP, Exercise Physiologist   Supervising physician immediately available to respond to emergencies See telemetry face sheet for immediately available ER MD   Medication changes reported     No   Fall or balance concerns reported    No   Warm-up and Cool-down Performed on first and last piece of equipment   Resistance Training Performed Yes   VAD Patient? No     Pain Assessment   Currently in Pain? No/denies   Multiple Pain Sites No         Goals Met:  Exercise tolerated well No report of cardiac concerns or symptoms Strength training completed today  Goals Unmet:  Not Applicable  Comments: Pt able to follow exercise prescription today without complaint.  Will continue to monitor for progression.    Dr. Emily Filbert is Medical Director for [place] and [name].

## 2016-11-30 NOTE — Patient Instructions (Signed)
Discharge Instructions  Patient Details  Name: Jason [name] MRN: [id number] Date of Birth: [birth year] Referring Provider:  Purcell Nails, MD   Number of Visits: 57  Reason for Discharge:  Patient reached a stable level of exercise. Patient independent in their exercise.  Smoking History:  History  Smoking Status  . Never Smoker  Smokeless Tobacco  . Current User  . Types: Chew    Diagnosis:  S/P [name]  Initial Exercise Prescription:     Initial Exercise Prescription - 09/28/16 1400      Date of Initial Exercise RX and Referring Provider   Date 09/28/16   Referring Provider Arnoldo Hooker MD     Treadmill   MPH 2.2   Grade 0.5   Minutes 15   METs 2.84     NuStep   Level 3   Watts --  80-100 spm   Minutes 15   METs 2     REL-XR   Level 2   Watts --  speed 50   Minutes 15   METs 2     Prescription Details   Frequency (times per week) 3   Duration Progress to 45 minutes of aerobic exercise without signs/symptoms of physical distress     Intensity   THRR 40-80% of Max Heartrate 116-141   Ratings of Perceived Exertion 11-15   Perceived Dyspnea 0-4     Progression   Progression Continue to progress workloads to maintain intensity without signs/symptoms of physical distress.     Resistance Training   Training Prescription Yes   Weight 4 lbs   Reps 10-12      Discharge Exercise Prescription (Final Exercise Prescription Changes):     Exercise Prescription Changes - 11/17/16 1500      Exercise Review   Progression Yes     Response to Exercise   Blood Pressure (Admit) 132/84   Blood Pressure (Exercise) 136/74   Blood Pressure (Exit) 120/60   Heart Rate (Admit) 87 bpm   Heart Rate (Exercise) 118 bpm   Heart Rate (Exit) 91 bpm   Rating of Perceived Exertion (Exercise) 13   Symptoms none   Comments Home Exercise Guidelines given 10/07/16   Duration Progress to 45 minutes of aerobic exercise without signs/symptoms of physical distress    Intensity THRR unchanged     Progression   Progression Continue to progress workloads to maintain intensity without signs/symptoms of physical distress.   Average METs 3.69     Resistance Training   Training Prescription Yes   Weight 6 lbs   Reps 10-15     Interval Training   Interval Training No     Treadmill   MPH 2.5   Grade 0.5   Minutes 15   METs 3.09     NuStep   Level 5   Minutes 15   METs 5.3     REL-XR   Level 4   Minutes 15   METs 2.5     Home Exercise Plan   Plans to continue exercise at Home  walk and bike   Frequency Add 2 additional days to program exercise sessions.      Functional Capacity:     6 Minute Walk    Row Name 09/28/16 1457 11/23/16 0844       6 Minute Walk   Phase Initial Discharge    Distance 1210 feet 1440 feet    Distance % Change  - 19 %  230 ft  Walk Time 6 minutes 6 minutes    # of Rest Breaks 0 0    MPH 2.29 2.73    METS 2.78 3.1    RPE 11 13    Perceived Dyspnea  2 1    VO2 Peak 9.76 10.84    Symptoms Yes (comment) Yes (comment)    Comments chronic knee pain 6/10, SOB SOB    Resting HR 90 bpm 88 bpm    Resting BP 136/66 126/64    Max Ex. HR 121 bpm 112 bpm    Max Ex. BP 136/64 136/70    2 Minute Post BP 124/62  -       Quality of Life:     Quality of Life - 11/09/16 0901      Quality of Life Scores   Health/Function Pre 25.07 %   Health/Function Post 25.83 %   Health/Function % Change 3.03 %   Socioeconomic Pre 28.5 %   Socioeconomic Post 27.93 %   Socioeconomic % Change  -2 %   Psych/Spiritual Pre 28.29 %   Psych/Spiritual Post 27.43 %   Psych/Spiritual % Change -3.04 %   Family Pre 28.5 %   Family Post 27 %   Family % Change -5.26 %   GLOBAL Pre 26.94 %   GLOBAL Post 26.76 %   GLOBAL % Change -0.67 %      Personal Goals: Goals established at orientation with interventions provided to work toward goal.     Personal Goals and Risk Factors at Admission - 09/28/16 1455      Core  Components/Risk Factors/Patient Goals on Admission    Weight Management Obesity;Weight Maintenance;Yes   Intervention Weight Management: Develop a combined nutrition and exercise program designed to reach desired caloric intake, while maintaining appropriate intake of nutrient and fiber, sodium and fats, and appropriate energy expenditure required for the weight goal.   Admit Weight 282 lb 12.8 oz (128.3 kg)   Goal Weight: Short Term 280 lb (127 kg)   Goal Weight: Long Term 235 lb (106.6 kg)   Expected Outcomes Short Term: Continue to assess and modify interventions until short term weight is achieved;Long Term: Adherence to nutrition and physical activity/exercise program aimed toward attainment of established weight goal;Weight Loss: Understanding of general recommendations for a balanced deficit meal plan, which promotes 1-2 lb weight loss per week and includes a negative energy balance of [phone] kcal/d   Sedentary Yes   Intervention Provide advice, education, support and counseling about physical activity/exercise needs.;Develop an individualized exercise prescription for aerobic and resistive training based on initial evaluation findings, risk stratification, comorbidities and participant's personal goals.   Expected Outcomes Achievement of increased cardiorespiratory fitness and enhanced flexibility, muscular endurance and strength shown through measurements of functional capacity and personal statement of participant.   Increase Strength and Stamina Yes   Intervention Provide advice, education, support and counseling about physical activity/exercise needs.;Develop an individualized exercise prescription for aerobic and resistive training based on initial evaluation findings, risk stratification, comorbidities and participant's personal goals.   Expected Outcomes Achievement of increased cardiorespiratory fitness and enhanced flexibility, muscular endurance and strength shown through measurements  of functional capacity and personal statement of participant.   Diabetes Yes   Intervention Provide education about signs/symptoms and action to take for hypo/hyperglycemia.;Provide education about proper nutrition, including hydration, and aerobic/resistive exercise prescription along with prescribed medications to achieve blood glucose in normal ranges: Fasting glucose 65-99 mg/dL   Expected Outcomes Short Term: Participant verbalizes understanding of  the signs/symptoms and immediate care of hyper/hypoglycemia, proper foot care and importance of medication, aerobic/resistive exercise and nutrition plan for blood glucose control.;Long Term: Attainment of HbA1C < 7%.   Hypertension Yes   Intervention Provide education on lifestyle modifcations including regular physical activity/exercise, weight management, moderate sodium restriction and increased consumption of fresh fruit, vegetables, and low fat dairy, alcohol moderation, and smoking cessation.;Monitor prescription use compliance.   Expected Outcomes Short Term: Continued assessment and intervention until BP is < 140/23mm HG in hypertensive participants. < 130/50mm HG in hypertensive participants with diabetes, heart failure or chronic kidney disease.;Long Term: Maintenance of blood pressure at goal levels.   Lipids Yes   Intervention Provide education and support for participant on nutrition & aerobic/resistive exercise along with prescribed medications to achieve LDL 70mg , HDL >40mg .   Expected Outcomes Short Term: Participant states understanding of desired cholesterol values and is compliant with medications prescribed. Participant is following exercise prescription and nutrition guidelines.;Long Term: Cholesterol controlled with medications as prescribed, with individualized exercise RX and with personalized nutrition plan. Value goals: LDL < 70mg , HDL > 40 mg.       Personal Goals Discharge:     Goals and Risk Factor Review - 11/02/16 0848       Core Components/Risk Factors/Patient Goals Review   Personal Goals Review Weight Management/Obesity;Lipids;Hypertension;Diabetes;Sedentary;Increase Strength and Stamina   Review Tramane has been doing well in rehab.  He has been more active at home and around the shop, but he is not taking the time to go for a walk to get his cardio.  His weight has been steady (280.4 today), but he would still like to lose more weight.  His blood sugars and blood pressures have been good.  He has not had any problems with his medications.     Expected Outcomes Short: Cassandra is going to add in some extra walking at home. Long: Isaul will continue to exercise and work on diet to lose more weight.      Nutrition & Weight - Outcomes:     Pre Biometrics - 09/28/16 1502      Pre Biometrics   Height 6' 3.5" (1.918 m)   Weight 282 lb 12.8 oz (128.3 kg)   Waist Circumference 46.75 inches   Hip Circumference 45.5 inches   Waist to Hip Ratio 1.03 %   BMI (Calculated) 35   Single Leg Stand 3.59 seconds       Nutrition:     Nutrition Therapy & Goals - 10/09/16 1218      Nutrition Therapy   Diet Instructed on a heart healthy meal plan based on 2000 calories and including dietary guidelines for diabetes.   Drug/Food Interactions Statins/Certain Fruits   Fiber 30 grams   Whole Grain Foods 8 servings   Saturated Fats 14 max. grams   Fruits and Vegetables 5 servings/day   Sodium 2000 grams  1500 ideal     Personal Nutrition Goals   Personal Goal #1 Work on better portion control. Refer to hand-out for servings of carbohydrate.   Personal Goal #2 Try Mueller's brand 100% whole wheat pasts. Also increase whole grains with brown rice/wild rice and oatmeal.    Personal Goal #3 Read labels for saturated fat, trans fat and sodium.     Intervention Plan   Intervention Prescribe, educate and counsel regarding individualized specific dietary modifications aiming towards targeted core components such as weight,  hypertension, lipid management, diabetes, heart failure and other comorbidities.;Nutrition handout(s) given to patient.  Expected Outcomes Short Term Goal: Understand basic principles of dietary content, such as calories, fat, sodium, cholesterol and nutrients.;Short Term Goal: A plan has been developed with personal nutrition goals set during dietitian appointment.;Long Term Goal: Adherence to prescribed nutrition plan.      Nutrition Discharge:     Nutrition Assessments - 11/09/16 0901      Rate Your Plate Scores   Pre Score 70   Pre Score % 77.8 %   Post Score 71   Post Score % 78.9 %   % Change 1.1 %      Education Questionnaire Score:     Knowledge Questionnaire Score - 11/09/16 0901      Knowledge Questionnaire Score   Pre Score 25/28   Post Score 28/28      Goals reviewed with patient; copy given to patient.

## 2016-12-02 ENCOUNTER — Encounter: Payer: Medicare Other | Admitting: *Deleted

## 2016-12-02 DIAGNOSIS — I25110 Atherosclerotic heart disease of native coronary artery with unstable angina pectoris: Secondary | ICD-10-CM | POA: Diagnosis not present

## 2016-12-02 DIAGNOSIS — I10 Essential (primary) hypertension: Secondary | ICD-10-CM | POA: Diagnosis not present

## 2016-12-02 DIAGNOSIS — Z79899 Other long term (current) drug therapy: Secondary | ICD-10-CM | POA: Diagnosis not present

## 2016-12-02 DIAGNOSIS — E782 Mixed hyperlipidemia: Secondary | ICD-10-CM | POA: Diagnosis not present

## 2016-12-02 DIAGNOSIS — Z951 Presence of aortocoronary bypass graft: Secondary | ICD-10-CM

## 2016-12-02 DIAGNOSIS — I25810 Atherosclerosis of coronary artery bypass graft(s) without angina pectoris: Secondary | ICD-10-CM | POA: Diagnosis not present

## 2016-12-02 NOTE — Progress Notes (Signed)
Daily Session Note  Patient Details  Name: Jason [name] MRN: [id number] Date of Birth: 21-Jan-1951 Referring Provider:   Flowsheet Row Cardiac Rehab from 09/28/2016 in [facility] Cardiac and Pulmonary Rehab  Referring Provider  Serafina Royals MD      Encounter Date: 12/02/2016  Check In:     Session Check In - 12/02/16 0841      Check-In   Location [facility]-Cardiac & Pulmonary Rehab   Staff Present Alberteen Sam, MA, ACSM RCEP, Exercise Physiologist;Carroll Enterkin, RN, Vickki Hearing, BA, ACSM CEP, Exercise Physiologist   Supervising physician immediately available to respond to emergencies See telemetry face sheet for immediately available ER MD   Medication changes reported     No   Fall or balance concerns reported    No   Warm-up and Cool-down Performed on first and last piece of equipment   Resistance Training Performed Yes   VAD Patient? No     Pain Assessment   Currently in Pain? No/denies   Multiple Pain Sites No         Goals Met:  Independence with exercise equipment Exercise tolerated well Personal goals reviewed No report of cardiac concerns or symptoms Strength training completed today  Goals Unmet:  Not Applicable  Comments:  Jason [name] graduated today from cardiac rehab with 36 sessions completed.  Details of the patient's exercise prescription and what He needs to do in order to continue the prescription and progress were discussed with patient.  Patient was given a copy of prescription and goals.  Patient verbalized understanding.  Jason [name] plans to continue to exercise by walk and ride his bike at home.    Dr. Emily Filbert is Medical Director for [place] and [name].

## 2016-12-02 NOTE — Progress Notes (Signed)
Discharge Summary  Patient Details  Name: Jason [name] MRN: [id number] Date of Birth: 12/31/1950 Referring Provider:   Flowsheet Row [facility]  Referring Provider  Jason [name], Jason [name]       Number of Visits: 36  Reason for Discharge:  Patient reached a stable level of exercise. Patient independent in their exercise.  Smoking History:  History  Smoking Status  . Never Smoker  Smokeless Tobacco  . Current User  . Types: Chew    Diagnosis:  S/P [name]  ADL UCSD:   Initial Exercise Prescription:     Initial Exercise Prescription - 09/28/16 1400      Date of Initial Exercise RX and Referring Provider   Date 09/28/16   Referring Provider Jason [name], Jason [name]     Treadmill   MPH 2.2   Grade 0.5   Minutes 15   METs 2.84     NuStep   Level 3   Watts --  80-100 spm   Minutes 15   METs 2     REL-XR   Level 2   Watts --  speed 50   Minutes 15   METs 2     Prescription Details   Frequency (times per week) 3   Duration Progress to 45 minutes of aerobic exercise without signs/symptoms of physical distress     Intensity   THRR 40-80% of Max Heartrate 116-141   Ratings of Perceived Exertion 11-15   Perceived Dyspnea 0-4     Progression   Progression Continue to progress workloads to maintain intensity without signs/symptoms of physical distress.     Resistance Training   Training Prescription Yes   Weight 4 lbs   Reps 10-12      Discharge Exercise Prescription (Final Exercise Prescription Changes):     Exercise Prescription Changes - 11/30/16 1500      Exercise Review   Progression Yes     Response to Exercise   Blood Pressure (Admit) 124/70   Blood Pressure (Exercise) 144/82   Blood Pressure (Exit) 134/80   Heart Rate (Admit) 95 bpm   Heart Rate (Exercise) 124 bpm   Heart Rate (Exit) 95 bpm   Rating of Perceived Exertion (Exercise) 13   Symptoms none   Comments Home Exercise  Guidelines given 10/07/16   Duration Progress to 45 minutes of aerobic exercise without signs/symptoms of physical distress   Intensity THRR unchanged     Progression   Progression Continue to progress workloads to maintain intensity without signs/symptoms of physical distress.   Average METs 3.87     Resistance Training   Training Prescription Yes   Weight 6 lbs   Reps 10-15     Interval Training   Interval Training No     Treadmill   MPH 2.5   Grade 1   Minutes 15   METs 3.26     NuStep   Level 6   Minutes 15   METs 4     REL-XR   Level 10   Minutes 15   METs 2.9     Home Exercise Plan   Plans to continue exercise at Home  walk and bike   Frequency Add 2 additional days to program exercise sessions.      Functional Capacity:     6 Minute Walk    Row Name 09/28/16 1457 11/23/16 0844       6 Minute Walk   Phase Initial Discharge  Distance 1210 feet 1440 feet    Distance % Change  - 19 %  230 ft    Walk Time 6 minutes 6 minutes    # of Rest Breaks 0 0    MPH 2.29 2.73    METS 2.78 3.1    RPE 11 13    Perceived Dyspnea  2 1    VO2 Peak 9.76 10.84    Symptoms Yes (comment) Yes (comment)    Comments chronic knee pain 6/10, SOB SOB    Resting HR 90 bpm 88 bpm    Resting BP 136/66 126/64    Max Ex. HR 121 bpm 112 bpm    Max Ex. BP 136/64 136/70    2 Minute Post BP 124/62  -       Psychological, QOL, Others - Outcomes: PHQ 2/9: Depression screen [facility] 2/9 11/09/2016 09/28/2016  Decreased Interest 0 0  Down, Depressed, Hopeless 0 0  PHQ - 2 Score 0 0  Altered sleeping 0 0  Tired, decreased energy 1 1  Change in appetite 0 0  Feeling bad or failure about yourself  0 1  Trouble concentrating 0 0  Moving slowly or fidgety/restless 0 0  Suicidal thoughts 0 0  PHQ-9 Score 1 2  Difficult doing work/chores Somewhat difficult Not difficult at all    Quality of Life:     Quality of Life - 11/09/16 0901      Quality of Life Scores    Health/Function Pre 25.07 %   Health/Function Post 25.83 %   Health/Function % Change 3.03 %   Socioeconomic Pre 28.5 %   Socioeconomic Post 27.93 %   Socioeconomic % Change  -2 %   Psych/Spiritual Pre 28.29 %   Psych/Spiritual Post 27.43 %   Psych/Spiritual % Change -3.04 %   Family Pre 28.5 %   Family Post 27 %   Family % Change -5.26 %   GLOBAL Pre 26.94 %   GLOBAL Post 26.76 %   GLOBAL % Change -0.67 %      Personal Goals: Goals established at orientation with interventions provided to work toward goal.     Personal Goals and Risk Factors at Admission - 09/28/16 1455      Core Components/Risk Factors/Patient Goals on Admission    Weight Management Obesity;Weight Maintenance;Yes   Intervention Weight Management: Develop a combined nutrition and exercise program designed to reach desired caloric intake, while maintaining appropriate intake of nutrient and fiber, sodium and fats, and appropriate energy expenditure required for the weight goal.   Admit Weight 282 lb 12.8 oz (128.3 kg)   Goal Weight: Short Term 280 lb (127 kg)   Goal Weight: Long Term 235 lb (106.6 kg)   Expected Outcomes Short Term: Continue to assess and modify interventions until short term weight is achieved;Long Term: Adherence to nutrition and physical activity/exercise program aimed toward attainment of established weight goal;Weight Loss: Understanding of general recommendations for a balanced deficit meal plan, which promotes 1-2 lb weight loss per week and includes a negative energy balance of [phone] kcal/d   Sedentary Yes   Intervention Provide advice, education, support and counseling about physical activity/exercise needs.;Develop an individualized exercise prescription for aerobic and resistive training based on initial evaluation findings, risk stratification, comorbidities and participant's personal goals.   Expected Outcomes Achievement of increased cardiorespiratory fitness and enhanced  flexibility, muscular endurance and strength shown through measurements of functional capacity and personal statement of participant.   Increase Strength and Stamina  Yes   Intervention Provide advice, education, support and counseling about physical activity/exercise needs.;Develop an individualized exercise prescription for aerobic and resistive training based on initial evaluation findings, risk stratification, comorbidities and participant's personal goals.   Expected Outcomes Achievement of increased cardiorespiratory fitness and enhanced flexibility, muscular endurance and strength shown through measurements of functional capacity and personal statement of participant.   Diabetes Yes   Intervention Provide education about signs/symptoms and action to take for hypo/hyperglycemia.;Provide education about proper nutrition, including hydration, and aerobic/resistive exercise prescription along with prescribed medications to achieve blood glucose in normal ranges: Fasting glucose 65-99 mg/dL   Expected Outcomes Short Term: Participant verbalizes understanding of the signs/symptoms and immediate care of hyper/hypoglycemia, proper foot care and importance of medication, aerobic/resistive exercise and nutrition plan for blood glucose control.;Long Term: Attainment of HbA1C < 7%.   Hypertension Yes   Intervention Provide education on lifestyle modifcations including regular physical activity/exercise, weight management, moderate sodium restriction and increased consumption of fresh fruit, vegetables, and low fat dairy, alcohol moderation, and smoking cessation.;Monitor prescription use compliance.   Expected Outcomes Short Term: Continued assessment and intervention until BP is < 140/51mm HG in hypertensive participants. < 130/69mm HG in hypertensive participants with diabetes, heart failure or chronic kidney disease.;Long Term: Maintenance of blood pressure at goal levels.   Lipids Yes   Intervention Provide  education and support for participant on nutrition & aerobic/resistive exercise along with prescribed medications to achieve LDL 70mg , HDL >40mg .   Expected Outcomes Short Term: Participant states understanding of desired cholesterol values and is compliant with medications prescribed. Participant is following exercise prescription and nutrition guidelines.;Long Term: Cholesterol controlled with medications as prescribed, with individualized exercise RX and with personalized nutrition plan. Value goals: LDL < 70mg , HDL > 40 mg.       Personal Goals Discharge:     Goals and Risk Factor Review    Row Name 10/09/16 0813 11/02/16 0848           Core Components/Risk Factors/Patient Goals Review   Personal Goals Review Weight Management/Obesity;Lipids;Hypertension;Diabetes Weight Management/Obesity;Lipids;Hypertension;Diabetes;Sedentary;Increase Strength and Stamina      Review Jason [name] has gained the 2 pounds he has lost since starting program.  He has been exercising  in class and active at home . He has RD appt today and expects to learn more to work on nutrition.  HAs attended diabetes program and is already following guidelines.  Blood sugar average 124.  BP reading are in good range.  Jason [name] has been doing well in rehab.  He has been more active at home and around the shop, but he is not taking the time to go for a walk to get his cardio.  His weight has been steady (280.4 today), but he would still like to lose more weight.  His blood sugars and blood pressures have been good.  He has not had any problems with his medications.        Expected Outcomes Continue to work on risk factor control with the exrcise, nutriton and meds prescribed.  Short: Jason [name] is going to add in some extra walking at home. Long: Jason [name] will continue to exercise and work on diet to lose more weight.         Nutrition & Weight - Outcomes:     Pre Biometrics - 09/28/16 1502      Pre Biometrics   Height 6' 3.5" (1.918 m)    Weight 282 lb 12.8 oz (128.3 kg)   Waist Circumference 46.75  inches   Hip Circumference 45.5 inches   Waist to Hip Ratio 1.03 %   BMI (Calculated) 35   Single Leg Stand 3.59 seconds       Nutrition:     Nutrition Therapy & Goals - 10/09/16 1218      Nutrition Therapy   Diet Instructed on a heart healthy meal plan based on 2000 calories and including dietary guidelines for diabetes.   Drug/Food Interactions Statins/Certain Fruits   Fiber 30 grams   Whole Grain Foods 8 servings   Saturated Fats 14 max. grams   Fruits and Vegetables 5 servings/day   Sodium 2000 grams  1500 ideal     Personal Nutrition Goals   Personal Goal #1 Work on better portion control. Refer to hand-out for servings of carbohydrate.   Personal Goal #2 Try [employer] brand 100% whole wheat pasts. Also increase whole grains with brown rice/wild rice and oatmeal.    Personal Goal #3 Read labels for saturated fat, trans fat and sodium.     Intervention Plan   Intervention Prescribe, educate and counsel regarding individualized specific dietary modifications aiming towards targeted core components such as weight, hypertension, lipid management, diabetes, heart failure and other comorbidities.;Nutrition handout(s) given to patient.   Expected Outcomes Short Term Goal: Understand basic principles of dietary content, such as calories, fat, sodium, cholesterol and nutrients.;Short Term Goal: A plan has been developed with personal nutrition goals set during dietitian appointment.;Long Term Goal: Adherence to prescribed nutrition plan.      Nutrition Discharge:     Nutrition Assessments - 11/09/16 0901      Rate Your Plate Scores   Pre Score 70   Pre Score % 77.8 %   Post Score 71   Post Score % 78.9 %   % Change 1.1 %      Education Questionnaire Score:     Knowledge Questionnaire Score - 11/09/16 0901      Knowledge Questionnaire Score   Pre Score 25/28   Post Score 28/28      Goals reviewed with  patient; copy given to patient.

## 2016-12-02 NOTE — Progress Notes (Signed)
Cardiac Individual Treatment Plan  Patient Details  Name: Jason [name] MRN: [id number] Date of Birth: 29-Dec-1950 Referring Provider:   Flowsheet Row Cardiac Rehab from 09/28/2016 in [facility] Cardiac and Pulmonary Rehab  Referring Provider  Serafina Royals MD      Initial Encounter Date:  Flowsheet Row Cardiac Rehab from 09/28/2016 in [facility] Cardiac and Pulmonary Rehab  Date  09/28/16  Referring Provider  Serafina Royals MD      Visit Diagnosis: S/P [name]  Patient's Home Medications on Admission:  Current Outpatient Prescriptions:  .  atorvastatin (LIPITOR) 80 MG tablet, Take 1 tablet (80 mg total) by mouth daily at 6 PM., Disp: 30 tablet, Rfl: 1 .  canagliflozin (INVOKANA) 300 MG TABS tablet, Take 300 mg by mouth daily before breakfast. Reported on 04/13/2016, Disp: , Rfl:  .  carvedilol (COREG) 6.25 MG tablet, Take 1 tablet (6.25 mg total) by mouth 2 (two) times daily with a meal., Disp: 60 tablet, Rfl: 1 .  clopidogrel (PLAVIX) 75 MG tablet, Take 1 tablet (75 mg total) by mouth daily., Disp: 30 tablet, Rfl: 1 .  gemfibrozil (LOPID) 600 MG tablet, Take 600 mg by mouth 2 (two) times daily before a meal., Disp: , Rfl:  .  glimepiride (AMARYL) 4 MG tablet, Take 4 mg by mouth 2 (two) times daily. Reported on 04/13/2016, Disp: , Rfl:  .  losartan-hydrochlorothiazide (HYZAAR) 100-25 MG tablet, Take 0.5 tablets by mouth every evening., Disp: 30 tablet, Rfl: 1 .  metFORMIN (GLUCOPHAGE-XR) 750 MG 24 hr tablet, Take 750 mg by mouth daily with breakfast. Reported on 04/13/2016, Disp: , Rfl:   Past Medical History: Past Medical History:  Diagnosis Date  . Arthritis   . 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 Cough    CHRONIC  . Diabetes mellitus without complication ([place])   . Edema    FEET/LEGS  . Essential hypertension   . GERD (gastroesophageal reflux disease)   . Hypertension   . Left main coronary artery disease 04/13/2016   . Mixed hyperlipidemia   . Obesity   . 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 Type II diabetes mellitus ([place])   . Unstable angina ([place]) 04/13/2016    Tobacco Use: History  Smoking Status  . Never Smoker  Smokeless Tobacco  . Current User  . Types: Chew    Labs: Recent Review Flowsheet Data    Labs for ITP Cardiac and Pulmonary Rehab Latest Ref Rng & Units 04/14/2016 04/14/2016 04/14/2016 04/15/2016 04/23/2016   Cholestrol 0 - 200 mg/dL - - - - -   LDLCALC 0 - 99 mg/dL - - - - -   HDL >40 mg/dL - - - - -   Trlycerides <150 mg/dL - - - - 135   Hemoglobin A1c 4.8 - 5.6 % - - - - -   PHART 7.350 - 7.450 7.403 7.328(L) - - -   PCO2ART 35.0 - 45.0 mmHg 40.6 47.5(H) - - -   HCO3 20.0 - 24.0 mEq/L 25.4(H) 25.0(H) - - -   TCO2 0 - 100 mmol/L _0 -   ACIDBASEDEF 0.0 - 2.0 mmol/L - 1.0 - - -   O2SAT % 98.0 91.0 - - -       Exercise Target Goals:    Exercise Program Goal: Individual exercise prescription  set with THRR, safety & activity barriers. Participant demonstrates ability to understand and report RPE using BORG scale, to self-measure pulse accurately, and to acknowledge the importance of the exercise prescription.  Exercise Prescription Goal: Starting with aerobic activity 30 plus minutes a day, 3 days per week for initial exercise prescription. Provide home exercise prescription and guidelines that participant acknowledges understanding prior to discharge.  Activity Barriers & Risk Stratification:     Activity Barriers & Cardiac Risk Stratification - 09/28/16 1500      Activity Barriers & Cardiac Risk Stratification   Activity Barriers Arthritis;Back Problems;Balance Concerns;Joint Problems;Deconditioning;Shortness of Breath;Muscular Weakness  herniated disc L5 L6 surfery in past, both knees arthritis   Cardiac Risk Stratification High      6 Minute Walk:     6 Minute Walk    Row Name 09/28/16  1457 11/23/16 0844       6 Minute Walk   Phase Initial Discharge    Distance 1210 feet 1440 feet    Distance % Change  - 19 %  230 ft    Walk Time 6 minutes 6 minutes    # of Rest Breaks 0 0    MPH 2.29 2.73    METS 2.78 3.1    RPE 11 13    Perceived Dyspnea  2 1    VO2 Peak 9.76 10.84    Symptoms Yes (comment) Yes (comment)    Comments chronic knee pain 6/10, SOB SOB    Resting HR 90 bpm 88 bpm    Resting BP 136/66 126/64    Max Ex. HR 121 bpm 112 bpm    Max Ex. BP 136/64 136/70    2 Minute Post BP 124/62  -       Initial Exercise Prescription:     Initial Exercise Prescription - 09/28/16 1400      Date of Initial Exercise RX and Referring Provider   Date 09/28/16   Referring Provider Serafina Royals MD     Treadmill   MPH 2.2   Grade 0.5   Minutes 15   METs 2.84     NuStep   Level 3   Watts --  80-100 spm   Minutes 15   METs 2     REL-XR   Level 2   Watts --  speed 50   Minutes 15   METs 2     Prescription Details   Frequency (times per week) 3   Duration Progress to 45 minutes of aerobic exercise without signs/symptoms of physical distress     Intensity   THRR 40-80% of Max Heartrate 116-141   Ratings of Perceived Exertion 11-15   Perceived Dyspnea 0-4     Progression   Progression Continue to progress workloads to maintain intensity without signs/symptoms of physical distress.     Resistance Training   Training Prescription Yes   Weight 4 lbs   Reps 10-12      Perform Capillary Blood Glucose checks as needed.  Exercise Prescription Changes:     Exercise Prescription Changes    Row Name 09/28/16 1400 10/07/16 0800 10/07/16 1400 10/21/16 1500 11/04/16 1400     Exercise Review   Progression -  walk test results Yes - Yes Yes     Response to Exercise   Blood Pressure (Admit) 136/66 126/72  - 122/60 120/64   Blood Pressure (Exercise) 136/64 144/80  - 128/74 148/84   Blood Pressure (Exit) 124/62 120/72  - 138/80 122/64   Heart  Rate (Admit) 90 bpm 55 bpm  - 70 bpm 94 bpm   Heart Rate (Exercise) 121 bpm 115 bpm  - 133 bpm 125 bpm   Heart Rate (Exit) 93 bpm 101 bpm  - 94 bpm 92 bpm   Oxygen Saturation (Admit) 96 %  -  -  -  -   Rating of Perceived Exertion (Exercise) 11 12  - 13 13   Symptoms knee pain 6/10 and SOB none  - none none   Comments  - Home Exercise Guidelines given 10/07/16  - Home Exercise Guidelines given 10/07/16 Home Exercise Guidelines given 10/07/16   Duration  - Progress to 45 minutes of aerobic exercise without signs/symptoms of physical distress  - Progress to 45 minutes of aerobic exercise without signs/symptoms of physical distress Progress to 45 minutes of aerobic exercise without signs/symptoms of physical distress   Intensity  - THRR unchanged  - THRR unchanged THRR unchanged     Progression   Progression  - Continue to progress workloads to maintain intensity without signs/symptoms of physical distress.  - Continue to progress workloads to maintain intensity without signs/symptoms of physical distress. Continue to progress workloads to maintain intensity without signs/symptoms of physical distress.   Average METs  - 2.95  - 2.52 3.6     Resistance Training   Training Prescription  - Yes  - Yes Yes   Weight  - 4 lbs  - 4 lbs 5 lbs   Reps  - 10-12  - 10-12 10-15     Interval Training   Interval Training  - No  - No No     Treadmill   MPH  - 2.2  - 2.5 2.5   Grade  - 0.5  - 0.5 0.5   Minutes  - 15  - 15 15   METs  - 2.84  - 3.26 3.09     NuStep   Level  - 3  - 3 5   Minutes  - 15  - 15 15   METs  - 3.9  - 2.6 3.5     REL-XR   Level  - 2  - 4 4   Minutes  - 15  - 15 15   METs  - 2.1  - 1.7 4.2     Home Exercise Plan   Plans to continue exercise at  - Home  - Home  walk and bike Home  walk and bike   Frequency  - Add 2 additional days to program exercise sessions.  - Add 2 additional days to program exercise sessions. Add 2 additional days to program exercise sessions.   Row  Name 11/17/16 1500 11/30/16 1500           Exercise Review   Progression Yes Yes        Response to Exercise   Blood Pressure (Admit) 132/84 124/70      Blood Pressure (Exercise) 136/74 144/82      Blood Pressure (Exit) 120/60 134/80      Heart Rate (Admit) 87 bpm 95 bpm      Heart Rate (Exercise) 118 bpm 124 bpm      Heart Rate (Exit) 91 bpm 95 bpm      Rating of Perceived Exertion (Exercise) 13 13      Symptoms none none      Comments Home Exercise Guidelines given 10/07/16 Home Exercise Guidelines given 10/07/16      Duration Progress to 45 minutes of aerobic exercise  without signs/symptoms of physical distress Progress to 45 minutes of aerobic exercise without signs/symptoms of physical distress      Intensity THRR unchanged THRR unchanged        Progression   Progression Continue to progress workloads to maintain intensity without signs/symptoms of physical distress. Continue to progress workloads to maintain intensity without signs/symptoms of physical distress.      Average METs 3.69 3.87        Resistance Training   Training Prescription Yes Yes      Weight 6 lbs 6 lbs      Reps 10-15 10-15        Interval Training   Interval Training No No        Treadmill   MPH 2.5 2.5      Grade 0.5 1      Minutes 15 15      METs 3.09 3.26        NuStep   Level 5 6      Minutes 15 15      METs 5.3 4        REL-XR   Level 4 10      Minutes 15 15      METs 2.5 2.9        Home Exercise Plan   Plans to continue exercise at Home  walk and bike Home  walk and bike      Frequency Add 2 additional days to program exercise sessions. Add 2 additional days to program exercise sessions.         Exercise Comments:     Exercise Comments    Row Name 09/28/16 1500 09/30/16 0857 10/07/16 0840 10/07/16 1456 10/21/16 1540   Exercise Comments Primary goal is to increase stamina. First full day of exercise!  Patient was oriented to gym and equipment including functions, settings,  policies, and procedures.  Patient's individual exercise prescription and treatment plan were reviewed.  All starting workloads were established based on the results of the 6 minute walk test done at initial orientation visit.  The plan for exercise progression was also introduced and progression will be customized based on patient's performance and goals. Reviewed home exercise with pt today.  Pt plans to walk/bike at home for exercise.  Reviewed THR, pulse, RPE, sign and symptoms, NTG use, and when to call 911 or MD.  Also discussed weather considerations and indoor options.  Pt voiced understanding. Nicoli is off to a great start with rehab.  Today he did take it a little easier as he was not feeling very good with a head cold.  We will continue to monitor his progression. May continues to do well with exercise.  He is starting to feel better from his head cold.  He is now back to his regular workloads.  We will continue to monitor his progression.   [place] Name 10/23/16 [phone] 11/04/16 1401 11/09/16 0847 11/17/16 1501 11/23/16 0846   Exercise Comments Reviewed METs average and discussed progression with pt today. Sherril is starting to feel better from his upper respiratory infection.  He is up to 4.0 METs on the XR.  We will continue to monitor his progression. Reviewed METs average and discussed progression with pt today. Caedmon continues to do well in rehab.  He is up to over 5 METs on the NuStep.  We will continue to track his progress. Jaramie improved his walk test by 16%!!   [place] Name 11/30/16 [phone] 12/02/16 [phone]  Exercise Comments Elden will graduating on Wednesday!!  He has done great in rehab! Brydan graduated today from cardiac rehab with 36 sessions completed.  Details of the patient's exercise prescription and what He needs to do in order to continue the prescription and progress were discussed with patient.  Patient was given a copy of prescription and goals.  Patient verbalized understanding.  Makar  plans to continue to exercise by walk and ride his bike at home.         Discharge Exercise Prescription (Final Exercise Prescription Changes):     Exercise Prescription Changes - 11/30/16 1500      Exercise Review   Progression Yes     Response to Exercise   Blood Pressure (Admit) 124/70   Blood Pressure (Exercise) 144/82   Blood Pressure (Exit) 134/80   Heart Rate (Admit) 95 bpm   Heart Rate (Exercise) 124 bpm   Heart Rate (Exit) 95 bpm   Rating of Perceived Exertion (Exercise) 13   Symptoms none   Comments Home Exercise Guidelines given 10/07/16   Duration Progress to 45 minutes of aerobic exercise without signs/symptoms of physical distress   Intensity THRR unchanged     Progression   Progression Continue to progress workloads to maintain intensity without signs/symptoms of physical distress.   Average METs 3.87     Resistance Training   Training Prescription Yes   Weight 6 lbs   Reps 10-15     Interval Training   Interval Training No     Treadmill   MPH 2.5   Grade 1   Minutes 15   METs 3.26     NuStep   Level 6   Minutes 15   METs 4     REL-XR   Level 10   Minutes 15   METs 2.9     Home Exercise Plan   Plans to continue exercise at Home  walk and bike   Frequency Add 2 additional days to program exercise sessions.      Nutrition:  Target Goals: Understanding of nutrition guidelines, daily intake of sodium '1500mg'$ , cholesterol '200mg'$ , calories 30% from fat and 7% or less from saturated fats, daily to have 5 or more servings of fruits and vegetables.  Biometrics:     Pre Biometrics - 09/28/16 1502      Pre Biometrics   Height 6' 3.5" (1.918 m)   Weight 282 lb 12.8 oz (128.3 kg)   Waist Circumference 46.75 inches   Hip Circumference 45.5 inches   Waist to Hip Ratio 1.03 %   BMI (Calculated) 35   Single Leg Stand 3.59 seconds       Nutrition Therapy Plan and Nutrition Goals:     Nutrition Therapy & Goals - 10/09/16 1218       Nutrition Therapy   Diet Instructed on a heart healthy meal plan based on 2000 calories and including dietary guidelines for diabetes.   Drug/Food Interactions Statins/Certain Fruits   Fiber 30 grams   Whole Grain Foods 8 servings   Saturated Fats 14 max. grams   Fruits and Vegetables 5 servings/day   Sodium 2000 grams  1500 ideal     Personal Nutrition Goals   Personal Goal #1 Work on better portion control. Refer to hand-out for servings of carbohydrate.   Personal Goal #2 Try Mueller's brand 100% whole wheat pasts. Also increase whole grains with brown rice/wild rice and oatmeal.    Personal Goal #3 Read labels for saturated fat, trans fat  and sodium.     Intervention Plan   Intervention Prescribe, educate and counsel regarding individualized specific dietary modifications aiming towards targeted core components such as weight, hypertension, lipid management, diabetes, heart failure and other comorbidities.;Nutrition handout(s) given to patient.   Expected Outcomes Short Term Goal: Understand basic principles of dietary content, such as calories, fat, sodium, cholesterol and nutrients.;Short Term Goal: A plan has been developed with personal nutrition goals set during dietitian appointment.;Long Term Goal: Adherence to prescribed nutrition plan.      Nutrition Discharge: Rate Your Plate Scores:     Nutrition Assessments - 11/09/16 0901      Rate Your Plate Scores   Pre Score 70   Pre Score % 77.8 %   Post Score 71   Post Score % 78.9 %   % Change 1.1 %      Nutrition Goals Re-Evaluation:     Nutrition Goals Re-Evaluation    Row Name 11/02/16 0851             Personal Goal #1 Re-Evaluation   Personal Goal #1 Work on better portion control. Refer to hand-out for servings of carbohydrate.       Goal Progress Seen Yes       Comments Rawn has been watching his portion sizes a little more than he had prior to his appt.         Personal Goal #2 Re-Evaluation   Personal  Goal #2 Try Mueller's brand 100% whole wheat pasts. Also increase whole grains with brown rice/wild rice and oatmeal.        Goal Progress Seen Yes       Comments They do not eat a lot of pasta, but he is getting brown rice more frquently.         Personal Goal #3 Re-Evaluation   Personal Goal #3 Read labels for saturated fat, trans fat and sodium.       Goal Progress Seen Met       Comments Albie has been reading labels routinely.         Weight   Current Weight 280 lb 6.4 oz (127.2 kg)         Intervention Plan   Comments Jermey will continue to watch his portion sizes and choices to work on weight control.          Psychosocial: Target Goals: Acknowledge presence or absence of depression, maximize coping skills, provide positive support system. Participant is able to verbalize types and ability to use techniques and skills needed for reducing stress and depression.  Initial Review & Psychosocial Screening:     Initial Psych Review & Screening - 09/28/16 1458      Initial Review   Current issues with --  none reported     Family Dynamics   Good Support System? Yes  wife good friend/neighbor     Barriers   Psychosocial barriers to participate in program There are no identifiable barriers or psychosocial needs.;The patient should benefit from training in stress management and relaxation.     Screening Interventions   Interventions Encouraged to exercise      Quality of Life Scores:     Quality of Life - 11/09/16 0901      Quality of Life Scores   Health/Function Pre 25.07 %   Health/Function Post 25.83 %   Health/Function % Change 3.03 %   Socioeconomic Pre 28.5 %   Socioeconomic Post 27.93 %   Socioeconomic % Change  -2 %  Psych/Spiritual Pre 28.29 %   Psych/Spiritual Post 27.43 %   Psych/Spiritual % Change -3.04 %   Family Pre 28.5 %   Family Post 27 %   Family % Change -5.26 %   GLOBAL Pre 26.94 %   GLOBAL Post 26.76 %   GLOBAL % Change -0.67 %       PHQ-9: Recent Review Flowsheet Data    Depression screen [facility] 2/9 11/09/2016 09/28/2016   Decreased Interest 0 0   Down, Depressed, Hopeless 0 0   PHQ - 2 Score 0 0   Altered sleeping 0 0   Tired, decreased energy 1 1   Change in appetite 0 0   Feeling bad or failure about yourself  0 1   Trouble concentrating 0 0   Moving slowly or fidgety/restless 0 0   Suicidal thoughts 0 0   PHQ-9 Score 1 2   Difficult doing work/chores Somewhat difficult Not difficult at all      Psychosocial Evaluation and Intervention:     Psychosocial Evaluation - 10/28/16 0910      Psychosocial Evaluation & Interventions   Interventions Encouraged to exercise with the program and follow exercise prescription   Comments Counselor met with Mr. Kyser for initial psychosocial evaluation.  He is a 66 year old who had quadruple by-pass surgery this past June.  He has a strong support system with a spouse of 41 years and he's also actively involved in his local church.  Mr. Lemmie Evens  reports other than his heart, he is in pretty good health; with the exception of arthritis in his knees.  He sleeps well and has a good appetite.  He denies a history of depression or anxiety and states he is typically in a positive mood.  His health is his biggest stressor and realizing he is unable to do the things he used to enjoy.  Mr. Lemmie Evens has goals for this program to increase his stamina and strength.  Counselor encouraged him to begin researching follow up programs to consistently exercise once he completes this program.  Staff will be following with Mr. Lemmie Evens throughout the course of this program.        Psychosocial Re-Evaluation:     Psychosocial Re-Evaluation    Row Name 10/09/16 0809 11/02/16 0853           Psychosocial Re-Evaluation   Interventions Encouraged to attend Cardiac Rehabilitation for the exercise Encouraged to attend Cardiac Rehabilitation for the exercise      Comments Owais states he is doing well, no  depression no excess stress.   He has stated that the exercise in the program is definitely helping him with stamina and felling better. [place] has been doing well with his mood and stress.  Friday last week he was upset that he was not able to get here due to the weather.  He does not like to feel like he is letting someone down.  He also got upset when we were not able to return his call on Thursday afternoon after we had left for the day, but once we explained what had happened he happily accepted what had happend.  Keaton will aim to remain positive          Vocational Rehabilitation: Provide vocational rehab assistance to qualifying candidates.   Vocational Rehab Evaluation & Intervention:     Vocational Rehab - 09/28/16 1501      Initial Vocational Rehab Evaluation & Intervention   Assessment shows need for Vocational  Rehabilitation No      Education: Education Goals: Education classes will be provided on a weekly basis, covering required topics. Participant will state understanding/return demonstration of topics presented.  Learning Barriers/Preferences:     Learning Barriers/Preferences - 09/28/16 1501      Learning Barriers/Preferences   Learning Barriers None   Learning Preferences Skilled Demonstration;Written Material      Education Topics: General Nutrition Guidelines/Fats and Fiber: -Group instruction provided by verbal, written material, models and posters to present the general guidelines for heart healthy nutrition. Gives an explanation and review of dietary fats and fiber. Flowsheet Row Cardiac Rehab from 12/02/2016 in [facility] Cardiac and Pulmonary Rehab  Date  11/02/16  Educator  CR  Instruction Review Code  2- meets goals/outcomes      Controlling Sodium/Reading Food Labels: -Group verbal and written material supporting the discussion of sodium use in heart healthy nutrition. Review and explanation with models, verbal and written materials for utilization of the  food label. Flowsheet Row Cardiac Rehab from 12/02/2016 in [facility] Cardiac and Pulmonary Rehab  Date  11/09/16  Educator  CR  Instruction Review Code  2- meets goals/outcomes      Exercise Physiology & Risk Factors: - Group verbal and written instruction with models to review the exercise physiology of the cardiovascular system and associated critical values. Details cardiovascular disease risk factors and the goals associated with each risk factor. Flowsheet Row Cardiac Rehab from 12/02/2016 in [facility] Cardiac and Pulmonary Rehab  Date  11/16/16  Educator  [facility]  Instruction Review Code  2- meets goals/outcomes      Aerobic Exercise & Resistance Training: - Gives group verbal and written discussion on the health impact of inactivity. On the components of aerobic and resistive training programs and the benefits of this training and how to safely progress through these programs. Flowsheet Row Cardiac Rehab from 12/02/2016 in [facility] Cardiac and Pulmonary Rehab  Date  11/18/16  Educator  [facility]  Instruction Review Code  2- meets goals/outcomes      Flexibility, Balance, General Exercise Guidelines: - Provides group verbal and written instruction on the benefits of flexibility and balance training programs. Provides general exercise guidelines with specific guidelines to those with heart or lung disease. Demonstration and skill practice provided. Flowsheet Row Cardiac Rehab from 12/02/2016 in [facility] Cardiac and Pulmonary Rehab  Date  11/23/16  Educator  [facility]  Instruction Review Code  2- meets goals/outcomes      Stress Management: - Provides group verbal and written instruction about the health risks of elevated stress, cause of high stress, and healthy ways to reduce stress. Flowsheet Row Cardiac Rehab from 12/02/2016 in [facility] Cardiac and Pulmonary Rehab  Date  12/02/16  Educator  [facility]  Instruction Review Code  2- meets goals/outcomes      Depression: - Provides group verbal and written instruction on the  correlation between heart/lung disease and depressed mood, treatment options, and the stigmas associated with seeking treatment. Flowsheet Row Cardiac Rehab from 12/02/2016 in [facility] Cardiac and Pulmonary Rehab  Date  11/04/16  Educator  [facility]  Instruction Review Code  2- meets goals/outcomes      Anatomy & Physiology of the Heart: - Group verbal and written instruction and models provide basic cardiac anatomy and physiology, with the coronary electrical and arterial systems. Review of: AMI, Angina, Valve disease, Heart Failure, Cardiac Arrhythmia, Pacemakers, and the ICD. Flowsheet Row Cardiac Rehab from 12/02/2016 in [facility] Cardiac and Pulmonary Rehab  Date  11/30/16  Educator  MA  Instruction Review Code  2- meets goals/outcomes      Cardiac Procedures: - Group verbal and written instruction and models to describe the testing methods done to diagnose heart disease. Reviews the outcomes of the test results. Describes the treatment choices: Medical Management, Angioplasty, or Coronary Bypass Surgery. Flowsheet Row Cardiac Rehab from 12/02/2016 in [facility] Cardiac and Pulmonary Rehab  Date  10/05/16  Educator  CE  Instruction Review Code  2- meets goals/outcomes      Cardiac Medications: - Group verbal and written instruction to review commonly prescribed medications for heart disease. Reviews the medication, class of the drug, and side effects. Includes the steps to properly store meds and maintain the prescription regimen. Flowsheet Row Cardiac Rehab from 12/02/2016 in [facility] Cardiac and Pulmonary Rehab  Date  10/14/16 Marisue Humble 2]  Educator  TS  Instruction Review Code  2- meets goals/outcomes      Go Sex-Intimacy & Heart Disease, Get SMART - Goal Setting: - Group verbal and written instruction through game format to discuss heart disease and the return to sexual intimacy. Provides group verbal and written material to discuss and apply goal setting through the application of the S.M.A.R.T.  Method. Flowsheet Row Cardiac Rehab from 12/02/2016 in [facility] Cardiac and Pulmonary Rehab  Date  10/05/16  Educator  CE  Instruction Review Code  2- meets goals/outcomes      Other Matters of the Heart: - Provides group verbal, written materials and models to describe Heart Failure, Angina, Valve Disease, and Diabetes in the realm of heart disease. Includes description of the disease process and treatment options available to the cardiac patient. Flowsheet Row Cardiac Rehab from 12/02/2016 in [facility] Cardiac and Pulmonary Rehab  Date  11/30/16  Educator  MA  Instruction Review Code  2- meets goals/outcomes      Exercise & Equipment Safety: - Individual verbal instruction and demonstration of equipment use and safety with use of the equipment. Flowsheet Row Cardiac Rehab from 12/02/2016 in [facility] Cardiac and Pulmonary Rehab  Date  09/28/16  Educator  SB  Instruction Review Code  2- meets goals/outcomes      Infection Prevention: - Provides verbal and written material to individual with discussion of infection control including proper hand washing and proper equipment cleaning during exercise session. Flowsheet Row Cardiac Rehab from 12/02/2016 in [facility] Cardiac and Pulmonary Rehab  Date  09/28/16  Educator  SB  Instruction Review Code  2- meets goals/outcomes      Falls Prevention: - Provides verbal and written material to individual with discussion of falls prevention and safety. Flowsheet Row Cardiac Rehab from 12/02/2016 in [facility] Cardiac and Pulmonary Rehab  Date  09/28/16  Educator  SB  Instruction Review Code  2- meets goals/outcomes      Diabetes: - Individual verbal and written instruction to review signs/symptoms of diabetes, desired ranges of glucose level fasting, after meals and with exercise. Advice that pre and post exercise glucose checks will be done for 3 sessions at entry of program. Flowsheet Row Cardiac Rehab from 12/02/2016 in [facility] Cardiac and Pulmonary Rehab  Date   09/28/16  Educator  SB  Instruction Review Code  2- meets goals/outcomes       Knowledge Questionnaire Score:     Knowledge Questionnaire Score - 11/09/16 0901      Knowledge Questionnaire Score   Pre Score 25/28   Post Score 28/28      Core Components/Risk Factors/Patient Goals at Admission:     Personal  Goals and Risk Factors at Admission - 09/28/16 1455      Core Components/Risk Factors/Patient Goals on Admission    Weight Management Obesity;Weight Maintenance;Yes   Intervention Weight Management: Develop a combined nutrition and exercise program designed to reach desired caloric intake, while maintaining appropriate intake of nutrient and fiber, sodium and fats, and appropriate energy expenditure required for the weight goal.   Admit Weight 282 lb 12.8 oz (128.3 kg)   Goal Weight: Short Term 280 lb (127 kg)   Goal Weight: Long Term 235 lb (106.6 kg)   Expected Outcomes Short Term: Continue to assess and modify interventions until short term weight is achieved;Long Term: Adherence to nutrition and physical activity/exercise program aimed toward attainment of established weight goal;Weight Loss: Understanding of general recommendations for a balanced deficit meal plan, which promotes 1-2 lb weight loss per week and includes a negative energy balance of [phone] kcal/d   Sedentary Yes   Intervention Provide advice, education, support and counseling about physical activity/exercise needs.;Develop an individualized exercise prescription for aerobic and resistive training based on initial evaluation findings, risk stratification, comorbidities and participant's personal goals.   Expected Outcomes Achievement of increased cardiorespiratory fitness and enhanced flexibility, muscular endurance and strength shown through measurements of functional capacity and personal statement of participant.   Increase Strength and Stamina Yes   Intervention Provide advice, education, support and  counseling about physical activity/exercise needs.;Develop an individualized exercise prescription for aerobic and resistive training based on initial evaluation findings, risk stratification, comorbidities and participant's personal goals.   Expected Outcomes Achievement of increased cardiorespiratory fitness and enhanced flexibility, muscular endurance and strength shown through measurements of functional capacity and personal statement of participant.   Diabetes Yes   Intervention Provide education about signs/symptoms and action to take for hypo/hyperglycemia.;Provide education about proper nutrition, including hydration, and aerobic/resistive exercise prescription along with prescribed medications to achieve blood glucose in normal ranges: Fasting glucose 65-99 mg/dL   Expected Outcomes Short Term: Participant verbalizes understanding of the signs/symptoms and immediate care of hyper/hypoglycemia, proper foot care and importance of medication, aerobic/resistive exercise and nutrition plan for blood glucose control.;Long Term: Attainment of HbA1C < 7%.   Hypertension Yes   Intervention Provide education on lifestyle modifcations including regular physical activity/exercise, weight management, moderate sodium restriction and increased consumption of fresh fruit, vegetables, and low fat dairy, alcohol moderation, and smoking cessation.;Monitor prescription use compliance.   Expected Outcomes Short Term: Continued assessment and intervention until BP is < 140/16m HG in hypertensive participants. < 130/870mHG in hypertensive participants with diabetes, heart failure or chronic kidney disease.;Long Term: Maintenance of blood pressure at goal levels.   Lipids Yes   Intervention Provide education and support for participant on nutrition & aerobic/resistive exercise along with prescribed medications to achieve LDL '70mg'$ , HDL >'40mg'$ .   Expected Outcomes Short Term: Participant states understanding of desired  cholesterol values and is compliant with medications prescribed. Participant is following exercise prescription and nutrition guidelines.;Long Term: Cholesterol controlled with medications as prescribed, with individualized exercise RX and with personalized nutrition plan. Value goals: LDL < '70mg'$ , HDL > 40 mg.      Core Components/Risk Factors/Patient Goals Review:      Goals and Risk Factor Review    Row Name 10/09/16 0813 11/02/16 0848           Core Components/Risk Factors/Patient Goals Review   Personal Goals Review Weight Management/Obesity;Lipids;Hypertension;Diabetes Weight Management/Obesity;Lipids;Hypertension;Diabetes;Sedentary;Increase Strength and Stamina      Review JoTorenceas gained the 2  pounds he has lost since starting program.  He has been exercising  in class and active at home . He has RD appt today and expects to learn more to work on nutrition.  HAs attended diabetes program and is already following guidelines.  Blood sugar average 124.  BP reading are in good range.  Broxton has been doing well in rehab.  He has been more active at home and around the shop, but he is not taking the time to go for a walk to get his cardio.  His weight has been steady (280.4 today), but he would still like to lose more weight.  His blood sugars and blood pressures have been good.  He has not had any problems with his medications.        Expected Outcomes Continue to work on risk factor control with the exrcise, nutriton and meds prescribed.  Short: Vedant is going to add in some extra walking at home. Long: Nikita will continue to exercise and work on diet to lose more weight.         Core Components/Risk Factors/Patient Goals at Discharge (Final Review):      Goals and Risk Factor Review - 11/02/16 0848      Core Components/Risk Factors/Patient Goals Review   Personal Goals Review Weight Management/Obesity;Lipids;Hypertension;Diabetes;Sedentary;Increase Strength and Stamina   Review Jacky has  been doing well in rehab.  He has been more active at home and around the shop, but he is not taking the time to go for a walk to get his cardio.  His weight has been steady (280.4 today), but he would still like to lose more weight.  His blood sugars and blood pressures have been good.  He has not had any problems with his medications.     Expected Outcomes Short: Collins is going to add in some extra walking at home. Long: Latavious will continue to exercise and work on diet to lose more weight.      ITP Comments:     ITP Comments    Row Name 09/28/16 1451 09/30/16 0703 10/27/16 0601 11/25/16 0601     ITP Comments Medical rview completed. Initial ITP created  DIagnosis documentation can be found [facility] encounter 05/25/2016 30 day review completed for review by Dr Emily Filbert.  Continue with ITP unless changes noted by Dr Sabra Heck. New to program 30 day review. Continue with ITP unless directed changes per Medical Director review. 30 day review. Continue with ITP unless directed changes per Medical Director review.       Comments: Discharge ITP

## 2016-12-10 DIAGNOSIS — I25810 Atherosclerosis of coronary artery bypass graft(s) without angina pectoris: Secondary | ICD-10-CM | POA: Diagnosis not present

## 2016-12-15 DIAGNOSIS — I25810 Atherosclerosis of coronary artery bypass graft(s) without angina pectoris: Secondary | ICD-10-CM | POA: Diagnosis not present

## 2016-12-15 DIAGNOSIS — E782 Mixed hyperlipidemia: Secondary | ICD-10-CM | POA: Diagnosis not present

## 2016-12-15 DIAGNOSIS — I10 Essential (primary) hypertension: Secondary | ICD-10-CM | POA: Diagnosis not present

## 2016-12-23 ENCOUNTER — Encounter: Payer: Self-pay | Admitting: *Deleted

## 2016-12-23 DIAGNOSIS — Z951 Presence of aortocoronary bypass graft: Secondary | ICD-10-CM

## 2016-12-23 NOTE — Progress Notes (Signed)
Cardiac Individual Treatment Plan  Patient Details  Name: ZARIUS FURR MRN: [id number] Date of Birth: 29-Dec-1950 Referring Provider:   Flowsheet Row Cardiac Rehab from 09/28/2016 in [facility] Cardiac and Pulmonary Rehab  Referring Provider  Serafina Royals MD      Initial Encounter Date:  Flowsheet Row Cardiac Rehab from 09/28/2016 in [facility] Cardiac and Pulmonary Rehab  Date  09/28/16  Referring Provider  Serafina Royals MD      Visit Diagnosis: S/P [name]  Patient's Home Medications on Admission:  Current Outpatient Prescriptions:  .  atorvastatin (LIPITOR) 80 MG tablet, Take 1 tablet (80 mg total) by mouth daily at 6 PM., Disp: 30 tablet, Rfl: 1 .  canagliflozin (INVOKANA) 300 MG TABS tablet, Take 300 mg by mouth daily before breakfast. Reported on 04/13/2016, Disp: , Rfl:  .  carvedilol (COREG) 6.25 MG tablet, Take 1 tablet (6.25 mg total) by mouth 2 (two) times daily with a meal., Disp: 60 tablet, Rfl: 1 .  clopidogrel (PLAVIX) 75 MG tablet, Take 1 tablet (75 mg total) by mouth daily., Disp: 30 tablet, Rfl: 1 .  gemfibrozil (LOPID) 600 MG tablet, Take 600 mg by mouth 2 (two) times daily before a meal., Disp: , Rfl:  .  glimepiride (AMARYL) 4 MG tablet, Take 4 mg by mouth 2 (two) times daily. Reported on 04/13/2016, Disp: , Rfl:  .  losartan-hydrochlorothiazide (HYZAAR) 100-25 MG tablet, Take 0.5 tablets by mouth every evening., Disp: 30 tablet, Rfl: 1 .  metFORMIN (GLUCOPHAGE-XR) 750 MG 24 hr tablet, Take 750 mg by mouth daily with breakfast. Reported on 04/13/2016, Disp: , Rfl:   Past Medical History: Past Medical History:  Diagnosis Date  . Arthritis   . 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 Cough    CHRONIC  . Diabetes mellitus without complication ([place])   . Edema    FEET/LEGS  . Essential hypertension   . GERD (gastroesophageal reflux disease)   . Hypertension   . Left main coronary artery disease 04/13/2016   . Mixed hyperlipidemia   . Obesity   . 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 Type II diabetes mellitus ([place])   . Unstable angina ([place]) 04/13/2016    Tobacco Use: History  Smoking Status  . Never Smoker  Smokeless Tobacco  . Current User  . Types: Chew    Labs: Recent Review Flowsheet Data    Labs for ITP Cardiac and Pulmonary Rehab Latest Ref Rng & Units 04/14/2016 04/14/2016 04/14/2016 04/15/2016 04/23/2016   Cholestrol 0 - 200 mg/dL - - - - -   LDLCALC 0 - 99 mg/dL - - - - -   HDL >40 mg/dL - - - - -   Trlycerides <150 mg/dL - - - - 135   Hemoglobin A1c 4.8 - 5.6 % - - - - -   PHART 7.350 - 7.450 7.403 7.328(L) - - -   PCO2ART 35.0 - 45.0 mmHg 40.6 47.5(H) - - -   HCO3 20.0 - 24.0 mEq/L 25.4(H) 25.0(H) - - -   TCO2 0 - 100 mmol/L _0 -   ACIDBASEDEF 0.0 - 2.0 mmol/L - 1.0 - - -   O2SAT % 98.0 91.0 - - -       Exercise Target Goals:    Exercise Program Goal: Individual exercise prescription  set with THRR, safety & activity barriers. Participant demonstrates ability to understand and report RPE using BORG scale, to self-measure pulse accurately, and to acknowledge the importance of the exercise prescription.  Exercise Prescription Goal: Starting with aerobic activity 30 plus minutes a day, 3 days per week for initial exercise prescription. Provide home exercise prescription and guidelines that participant acknowledges understanding prior to discharge.  Activity Barriers & Risk Stratification:   6 Minute Walk:     6 Minute Walk    Row Name 11/23/16 0844         6 Minute Walk   Phase Discharge     Distance 1440 feet     Distance % Change 19 %  230 ft     Walk Time 6 minutes     # of Rest Breaks 0     MPH 2.73     METS 3.1     RPE 13     Perceived Dyspnea  1     VO2 Peak 10.84     Symptoms Yes (comment)     Comments SOB     Resting HR 88 bpm     Resting BP 126/64     Max Ex. HR 112  bpm     Max Ex. BP 136/70        Oxygen Initial Assessment:   Oxygen Re-Evaluation:   Oxygen Discharge (Final Oxygen Re-Evaluation):   Initial Exercise Prescription:   Perform Capillary Blood Glucose checks as needed.  Exercise Prescription Changes:     Exercise Prescription Changes    Row Name 11/04/16 1400 11/17/16 1500 11/30/16 1500         Response to Exercise   Blood Pressure (Admit) 120/64 132/84 124/70     Blood Pressure (Exercise) 148/84 136/74 144/82     Blood Pressure (Exit) 122/64 120/60 134/80     Heart Rate (Admit) 94 bpm 87 bpm 95 bpm     Heart Rate (Exercise) 125 bpm 118 bpm 124 bpm     Heart Rate (Exit) 92 bpm 91 bpm 95 bpm     Rating of Perceived Exertion (Exercise) _0 Symptoms none none none     Comments Home Exercise Guidelines given 10/07/16 Home Exercise Guidelines given 10/07/16 Home Exercise Guidelines given 10/07/16     Duration Progress to 45 minutes of aerobic exercise without signs/symptoms of physical distress Progress to 45 minutes of aerobic exercise without signs/symptoms of physical distress Progress to 45 minutes of aerobic exercise without signs/symptoms of physical distress     Intensity THRR unchanged THRR unchanged THRR unchanged       Progression   Progression Continue to progress workloads to maintain intensity without signs/symptoms of physical distress. Continue to progress workloads to maintain intensity without signs/symptoms of physical distress. Continue to progress workloads to maintain intensity without signs/symptoms of physical distress.     Average METs 3.6 3.69 3.87       Resistance Training   Training Prescription Yes Yes Yes     Weight 5 lbs 6 lbs 6 lbs     Reps 10-15 10-15 10-15       Interval Training   Interval Training No No No       Treadmill   MPH 2.5 2.5 2.5     Grade 0.5 0.5 1     Minutes _1 METs 3.09 3.09 3.26       NuStep   Level 5 5  6     Minutes _0 METs 3.5 5.3 4        REL-XR   Level _1 Minutes _2 METs 4.2 2.5 2.9       Home Exercise Plan   Plans to continue exercise at Home  walk and bike Home  walk and bike Home  walk and bike     Frequency Add 2 additional days to program exercise sessions. Add 2 additional days to program exercise sessions. Add 2 additional days to program exercise sessions.       Exercise Review   Progression Yes Yes Yes        Exercise Comments:     Exercise Comments    Row Name 11/04/16 1401 11/09/16 0847 11/17/16 1501 11/23/16 0846 11/30/16 1548   Exercise Comments Noel is starting to feel better from his upper respiratory infection.  He is up to 4.0 METs on the XR.  We will continue to monitor his progression. Reviewed METs average and discussed progression with pt today. Inioluwa continues to do well in rehab.  He is up to over 5 METs on the NuStep.  We will continue to track his progress. Jamol improved his walk test by 16%Jenny Reichmann will graduating on Wednesday!!  He has done great in rehab!   Row Name 12/02/16 [phone]           Exercise Comments Jerrett graduated today from cardiac rehab with 36 sessions completed.  Details of the patient's exercise prescription and what He needs to do in order to continue the prescription and progress were discussed with patient.  Patient was given a copy of prescription and goals.  Patient verbalized understanding.  Duston plans to continue to exercise by walk and ride his bike at home.          Exercise Goals and Review:   Exercise Goals Re-Evaluation :   Discharge Exercise Prescription (Final Exercise Prescription Changes):     Exercise Prescription Changes - 11/30/16 1500      Response to Exercise   Blood Pressure (Admit) 124/70   Blood Pressure (Exercise) 144/82   Blood Pressure (Exit) 134/80   Heart Rate (Admit) 95 bpm   Heart Rate (Exercise) 124 bpm   Heart Rate (Exit) 95 bpm   Rating of Perceived Exertion (Exercise) 13   Symptoms none   Comments  Home Exercise Guidelines given 10/07/16   Duration Progress to 45 minutes of aerobic exercise without signs/symptoms of physical distress   Intensity THRR unchanged     Progression   Progression Continue to progress workloads to maintain intensity without signs/symptoms of physical distress.   Average METs 3.87     Resistance Training   Training Prescription Yes   Weight 6 lbs   Reps 10-15     Interval Training   Interval Training No     Treadmill   MPH 2.5   Grade 1   Minutes 15   METs 3.26     NuStep   Level 6   Minutes 15   METs 4     REL-XR   Level 10   Minutes 15   METs 2.9     Home Exercise Plan   Plans to continue exercise at Home  walk and bike   Frequency Add 2 additional days to program exercise sessions.     Exercise Review   Progression Yes  Nutrition:  Target Goals: Understanding of nutrition guidelines, daily intake of sodium <1555m, cholesterol <206m calories 30% from fat and 7% or less from saturated fats, daily to have 5 or more servings of fruits and vegetables.  Biometrics:    Nutrition Therapy Plan and Nutrition Goals:   Nutrition Discharge: Rate Your Plate Scores:     Nutrition Assessments - 11/09/16 0901      Rate Your Plate Scores   Pre Score 70   Pre Score % 77.8 %   Post Score 71   Post Score % 78.9 %   % Change 1.1 %      Nutrition Goals Re-Evaluation:     Nutrition Goals Re-Evaluation    Row Name 11/02/16 0851             Goals   Current Weight 280 lb 6.4 oz (127.2 kg)       Nutrition Goal Work on better portion control. Refer to hand-out for servings of carbohydrate.       Comment JoAaryanas been watching his portion sizes a little more than he had prior to his appt.         Personal Goal #1 Re-Evaluation   Goal Progress Seen Yes         Personal Goal #2 Re-Evaluation   Personal Goal #2 Try Mueller's brand 100% whole wheat pasts. Also increase whole grains with brown rice/wild rice and oatmeal.         Goal Progress Seen Yes       Re-Evaluation They do not eat a lot of pasta, but he is getting brown rice more frquently.         Personal Goal #3 Re-Evaluation   Personal Goal #3 Read labels for saturated fat, trans fat and sodium.       Goal Progress Seen Met       Re-Evaluation JoOrlynas been reading labels routinely.         Intervention Plan   Comments JoLinellill continue to watch his portion sizes and choices to work on weight control.          Nutrition Goals Discharge (Final Nutrition Goals Re-Evaluation):     Nutrition Goals Re-Evaluation - 11/02/16 0851      Goals   Current Weight 280 lb 6.4 oz (127.2 kg)   Nutrition Goal Work on better portion control. Refer to hand-out for servings of carbohydrate.   Comment JoMohanadas been watching his portion sizes a little more than he had prior to his appt.     Personal Goal #1 Re-Evaluation   Goal Progress Seen Yes     Personal Goal #2 Re-Evaluation   Personal Goal #2 Try Mueller's brand 100% whole wheat pasts. Also increase whole grains with brown rice/wild rice and oatmeal.    Goal Progress Seen Yes   Re-Evaluation They do not eat a lot of pasta, but he is getting brown rice more frquently.     Personal Goal #3 Re-Evaluation   Personal Goal #3 Read labels for saturated fat, trans fat and sodium.   Goal Progress Seen Met   Re-Evaluation JoHarinderas been reading labels routinely.     Intervention Plan   Comments JoDamontill continue to watch his portion sizes and choices to work on weight control.      Psychosocial: Target Goals: Acknowledge presence or absence of significant depression and/or stress, maximize coping skills, provide positive support system. Participant is able to verbalize types and ability to use  techniques and skills needed for reducing stress and depression.   Initial Review & Psychosocial Screening:   Quality of Life Scores:      Quality of Life - 11/09/16 0901      Quality of Life Scores    Health/Function Pre 25.07 %   Health/Function Post 25.83 %   Health/Function % Change 3.03 %   Socioeconomic Pre 28.5 %   Socioeconomic Post 27.93 %   Socioeconomic % Change  -2 %   Psych/Spiritual Pre 28.29 %   Psych/Spiritual Post 27.43 %   Psych/Spiritual % Change -3.04 %   Family Pre 28.5 %   Family Post 27 %   Family % Change -5.26 %   GLOBAL Pre 26.94 %   GLOBAL Post 26.76 %   GLOBAL % Change -0.67 %      PHQ-9: Recent Review Flowsheet Data    Depression screen [facility] 2/9 11/09/2016 09/28/2016   Decreased Interest 0 0   Down, Depressed, Hopeless 0 0   PHQ - 2 Score 0 0   Altered sleeping 0 0   Tired, decreased energy 1 1   Change in appetite 0 0   Feeling bad or failure about yourself  0 1   Trouble concentrating 0 0   Moving slowly or fidgety/restless 0 0   Suicidal thoughts 0 0   PHQ-9 Score 1 2   Difficult doing work/chores Somewhat difficult Not difficult at all     Interpretation of Total Score  Total Score Depression Severity:  1-4 = Minimal depression, 5-9 = Mild depression, 10-14 = Moderate depression, 15-19 = Moderately severe depression, 20-27 = Severe depression   Psychosocial Evaluation and Intervention:     Psychosocial Evaluation - 10/28/16 0910      Psychosocial Evaluation & Interventions   Interventions Encouraged to exercise with the program and follow exercise prescription   Comments Counselor met with Mr. Sadowski for initial psychosocial evaluation.  He is a 66 year old who had quadruple by-pass surgery this past June.  He has a strong support system with a spouse of 41 years and he's also actively involved in his local church.  Mr. Lemmie Evens  reports other than his heart, he is in pretty good health; with the exception of arthritis in his knees.  He sleeps well and has a good appetite.  He denies a history of depression or anxiety and states he is typically in a positive mood.  His health is his biggest stressor and realizing he is unable to do the things he  used to enjoy.  Mr. Lemmie Evens has goals for this program to increase his stamina and strength.  Counselor encouraged him to begin researching follow up programs to consistently exercise once he completes this program.  Staff will be following with Mr. Lemmie Evens throughout the course of this program.        Psychosocial Re-Evaluation:     Psychosocial Re-Evaluation    Row Name 11/02/16 [phone]             Psychosocial Re-Evaluation   Comments Candido has been doing well with his mood and stress.  Friday last week he was upset that he was not able to get here due to the weather.  He does not like to feel like he is letting someone down.  He also got upset when we were not able to return his call on Thursday afternoon after we had left for the day, but once we explained what had happened he happily accepted  what had happend.  Zaivion will aim to remain positive        Interventions Encouraged to attend Cardiac Rehabilitation for the exercise          Psychosocial Discharge (Final Psychosocial Re-Evaluation):     Psychosocial Re-Evaluation - 11/02/16 0853      Psychosocial Re-Evaluation   Comments Everest has been doing well with his mood and stress.  Friday last week he was upset that he was not able to get here due to the weather.  He does not like to feel like he is letting someone down.  He also got upset when we were not able to return his call on Thursday afternoon after we had left for the day, but once we explained what had happened he happily accepted what had happend.  Adonnis will aim to remain positive    Interventions Encouraged to attend Cardiac Rehabilitation for the exercise      Vocational Rehabilitation: Provide vocational rehab assistance to qualifying candidates.   Vocational Rehab Evaluation & Intervention:   Education: Education Goals: Education classes will be provided on a weekly basis, covering required topics. Participant will state understanding/return demonstration of topics  presented.  Learning Barriers/Preferences:   Education Topics: General Nutrition Guidelines/Fats and Fiber: -Group instruction provided by verbal, written material, models and posters to present the general guidelines for heart healthy nutrition. Gives an explanation and review of dietary fats and fiber. Flowsheet Row Cardiac Rehab from 12/02/2016 in [facility] Cardiac and Pulmonary Rehab  Date  11/02/16  Educator  CR  Instruction Review Code  2- meets goals/outcomes      Controlling Sodium/Reading Food Labels: -Group verbal and written material supporting the discussion of sodium use in heart healthy nutrition. Review and explanation with models, verbal and written materials for utilization of the food label. Flowsheet Row Cardiac Rehab from 12/02/2016 in [facility] Cardiac and Pulmonary Rehab  Date  11/09/16  Educator  CR  Instruction Review Code  2- meets goals/outcomes      Exercise Physiology & Risk Factors: - Group verbal and written instruction with models to review the exercise physiology of the cardiovascular system and associated critical values. Details cardiovascular disease risk factors and the goals associated with each risk factor. Flowsheet Row Cardiac Rehab from 12/02/2016 in [facility] Cardiac and Pulmonary Rehab  Date  11/16/16  Educator  [facility]  Instruction Review Code  2- meets goals/outcomes      Aerobic Exercise & Resistance Training: - Gives group verbal and written discussion on the health impact of inactivity. On the components of aerobic and resistive training programs and the benefits of this training and how to safely progress through these programs. Flowsheet Row Cardiac Rehab from 12/02/2016 in [facility] Cardiac and Pulmonary Rehab  Date  11/18/16  Educator  [facility]  Instruction Review Code  2- meets goals/outcomes      Flexibility, Balance, General Exercise Guidelines: - Provides group verbal and written instruction on the benefits of flexibility and balance training programs.  Provides general exercise guidelines with specific guidelines to those with heart or lung disease. Demonstration and skill practice provided. Flowsheet Row Cardiac Rehab from 12/02/2016 in [facility] Cardiac and Pulmonary Rehab  Date  11/23/16  Educator  [facility]  Instruction Review Code  2- meets goals/outcomes      Stress Management: - Provides group verbal and written instruction about the health risks of elevated stress, cause of high stress, and healthy ways to reduce stress. Flowsheet Row Cardiac Rehab from 12/02/2016 in [facility] Cardiac  and Pulmonary Rehab  Date  12/02/16  Educator  [facility]  Instruction Review Code  2- meets goals/outcomes      Depression: - Provides group verbal and written instruction on the correlation between heart/lung disease and depressed mood, treatment options, and the stigmas associated with seeking treatment. Flowsheet Row Cardiac Rehab from 12/02/2016 in [facility] Cardiac and Pulmonary Rehab  Date  11/04/16  Educator  [facility]  Instruction Review Code  2- meets goals/outcomes      Anatomy & Physiology of the Heart: - Group verbal and written instruction and models provide basic cardiac anatomy and physiology, with the coronary electrical and arterial systems. Review of: AMI, Angina, Valve disease, Heart Failure, Cardiac Arrhythmia, Pacemakers, and the ICD. Flowsheet Row Cardiac Rehab from 12/02/2016 in [facility] Cardiac and Pulmonary Rehab  Date  11/30/16  Educator  MA  Instruction Review Code  2- meets goals/outcomes      Cardiac Procedures: - Group verbal and written instruction and models to describe the testing methods done to diagnose heart disease. Reviews the outcomes of the test results. Describes the treatment choices: Medical Management, Angioplasty, or Coronary Bypass Surgery. Flowsheet Row Cardiac Rehab from 12/02/2016 in [facility] Cardiac and Pulmonary Rehab  Date  10/05/16  Educator  CE  Instruction Review Code  2- meets goals/outcomes      Cardiac Medications: - Group  verbal and written instruction to review commonly prescribed medications for heart disease. Reviews the medication, class of the drug, and side effects. Includes the steps to properly store meds and maintain the prescription regimen. Flowsheet Row Cardiac Rehab from 12/02/2016 in [facility] Cardiac and Pulmonary Rehab  Date  10/14/16 Marisue Humble 2]  Educator  TS  Instruction Review Code  2- meets goals/outcomes      Go Sex-Intimacy & Heart Disease, Get SMART - Goal Setting: - Group verbal and written instruction through game format to discuss heart disease and the return to sexual intimacy. Provides group verbal and written material to discuss and apply goal setting through the application of the S.M.A.R.T. Method. Flowsheet Row Cardiac Rehab from 12/02/2016 in [facility] Cardiac and Pulmonary Rehab  Date  10/05/16  Educator  CE  Instruction Review Code  2- meets goals/outcomes      Other Matters of the Heart: - Provides group verbal, written materials and models to describe Heart Failure, Angina, Valve Disease, and Diabetes in the realm of heart disease. Includes description of the disease process and treatment options available to the cardiac patient. Flowsheet Row Cardiac Rehab from 12/02/2016 in [facility] Cardiac and Pulmonary Rehab  Date  11/30/16  Educator  MA  Instruction Review Code  2- meets goals/outcomes      Exercise & Equipment Safety: - Individual verbal instruction and demonstration of equipment use and safety with use of the equipment. Flowsheet Row Cardiac Rehab from 12/02/2016 in [facility] Cardiac and Pulmonary Rehab  Date  09/28/16  Educator  SB  Instruction Review Code  2- meets goals/outcomes      Infection Prevention: - Provides verbal and written material to individual with discussion of infection control including proper hand washing and proper equipment cleaning during exercise session. Flowsheet Row Cardiac Rehab from 12/02/2016 in [facility] Cardiac and Pulmonary Rehab  Date  09/28/16  Educator   SB  Instruction Review Code  2- meets goals/outcomes      Falls Prevention: - Provides verbal and written material to individual with discussion of falls prevention and safety. Flowsheet Row Cardiac Rehab from 12/02/2016 in [facility] Cardiac and Pulmonary Rehab  Date  09/28/16  Educator  SB  Instruction Review Code  2- meets goals/outcomes      Diabetes: - Individual verbal and written instruction to review signs/symptoms of diabetes, desired ranges of glucose level fasting, after meals and with exercise. Advice that pre and post exercise glucose checks will be done for 3 sessions at entry of program. Flowsheet Row Cardiac Rehab from 12/02/2016 in [facility] Cardiac and Pulmonary Rehab  Date  09/28/16  Educator  SB  Instruction Review Code  2- meets goals/outcomes       Knowledge Questionnaire Score:     Knowledge Questionnaire Score - 11/09/16 0901      Knowledge Questionnaire Score   Pre Score 25/28   Post Score 28/28      Core Components/Risk Factors/Patient Goals at Admission:   Core Components/Risk Factors/Patient Goals Review:      Goals and Risk Factor Review    Row Name 11/02/16 0848             Core Components/Risk Factors/Patient Goals Review   Personal Goals Review Weight Management/Obesity;Lipids;Hypertension;Diabetes;Sedentary;Increase Strength and Stamina       Review Niv has been doing well in rehab.  He has been more active at home and around the shop, but he is not taking the time to go for a walk to get his cardio.  His weight has been steady (280.4 today), but he would still like to lose more weight.  His blood sugars and blood pressures have been good.  He has not had any problems with his medications.         Expected Outcomes Short: Jayceon is going to add in some extra walking at home. Long: Early will continue to exercise and work on diet to lose more weight.          Core Components/Risk Factors/Patient Goals at Discharge (Final Review):      Goals  and Risk Factor Review - 11/02/16 0848      Core Components/Risk Factors/Patient Goals Review   Personal Goals Review Weight Management/Obesity;Lipids;Hypertension;Diabetes;Sedentary;Increase Strength and Stamina   Review Younes has been doing well in rehab.  He has been more active at home and around the shop, but he is not taking the time to go for a walk to get his cardio.  His weight has been steady (280.4 today), but he would still like to lose more weight.  His blood sugars and blood pressures have been good.  He has not had any problems with his medications.     Expected Outcomes Short: Purnell is going to add in some extra walking at home. Long: Lazarus will continue to exercise and work on diet to lose more weight.      ITP Comments:     ITP Comments    Row Name 10/27/16 0601 11/25/16 0601 12/23/16 0546       ITP Comments 30 day review. Continue with ITP unless directed changes per Medical Director review. 30 day review. Continue with ITP unless directed changes per Medical Director review. Discharged from program        Comments:

## 2017-02-18 DIAGNOSIS — E782 Mixed hyperlipidemia: Secondary | ICD-10-CM | POA: Diagnosis not present

## 2017-02-18 DIAGNOSIS — Z6833 Body mass index (BMI) 33.0-33.9, adult: Secondary | ICD-10-CM | POA: Diagnosis not present

## 2017-02-18 DIAGNOSIS — E119 Type 2 diabetes mellitus without complications: Secondary | ICD-10-CM | POA: Diagnosis not present

## 2017-02-18 DIAGNOSIS — I10 Essential (primary) hypertension: Secondary | ICD-10-CM | POA: Diagnosis not present

## 2017-02-18 DIAGNOSIS — I2510 Atherosclerotic heart disease of native coronary artery without angina pectoris: Secondary | ICD-10-CM | POA: Diagnosis not present

## 2017-02-25 IMAGING — CR DG ABDOMEN 2V
3 series · 3 of 3 positions shown · non-contrast
Comparison: Radiograph April 19, 2016.

CLINICAL DATA: Nausea, abdominal distention.

EXAM:
ABDOMEN - 2 VIEW

[abdomen supine (1 of 2)]
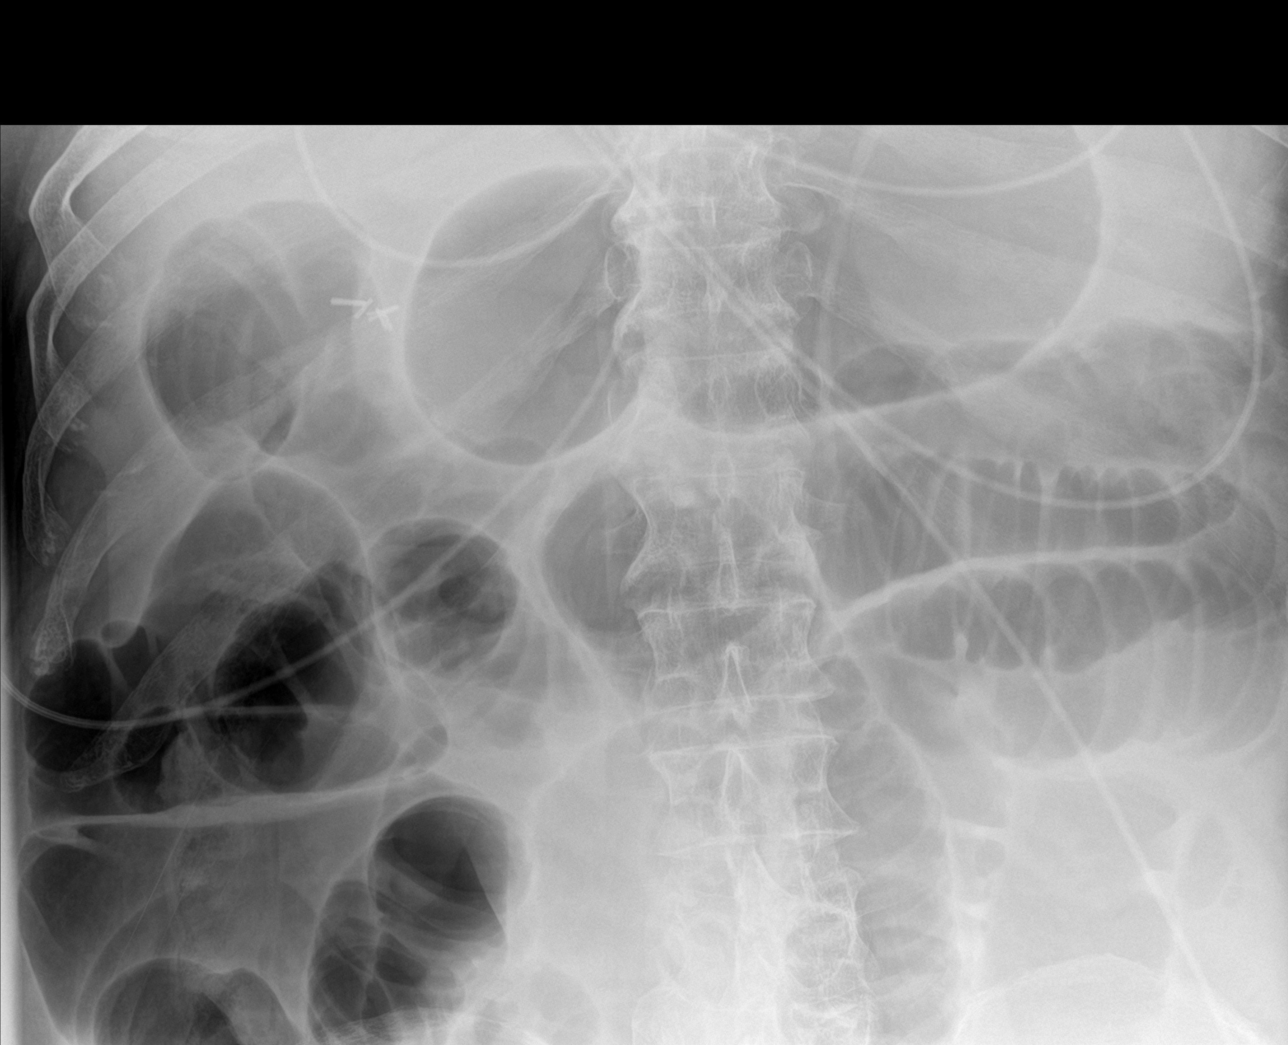

[abdomen supine (2 of 2)]
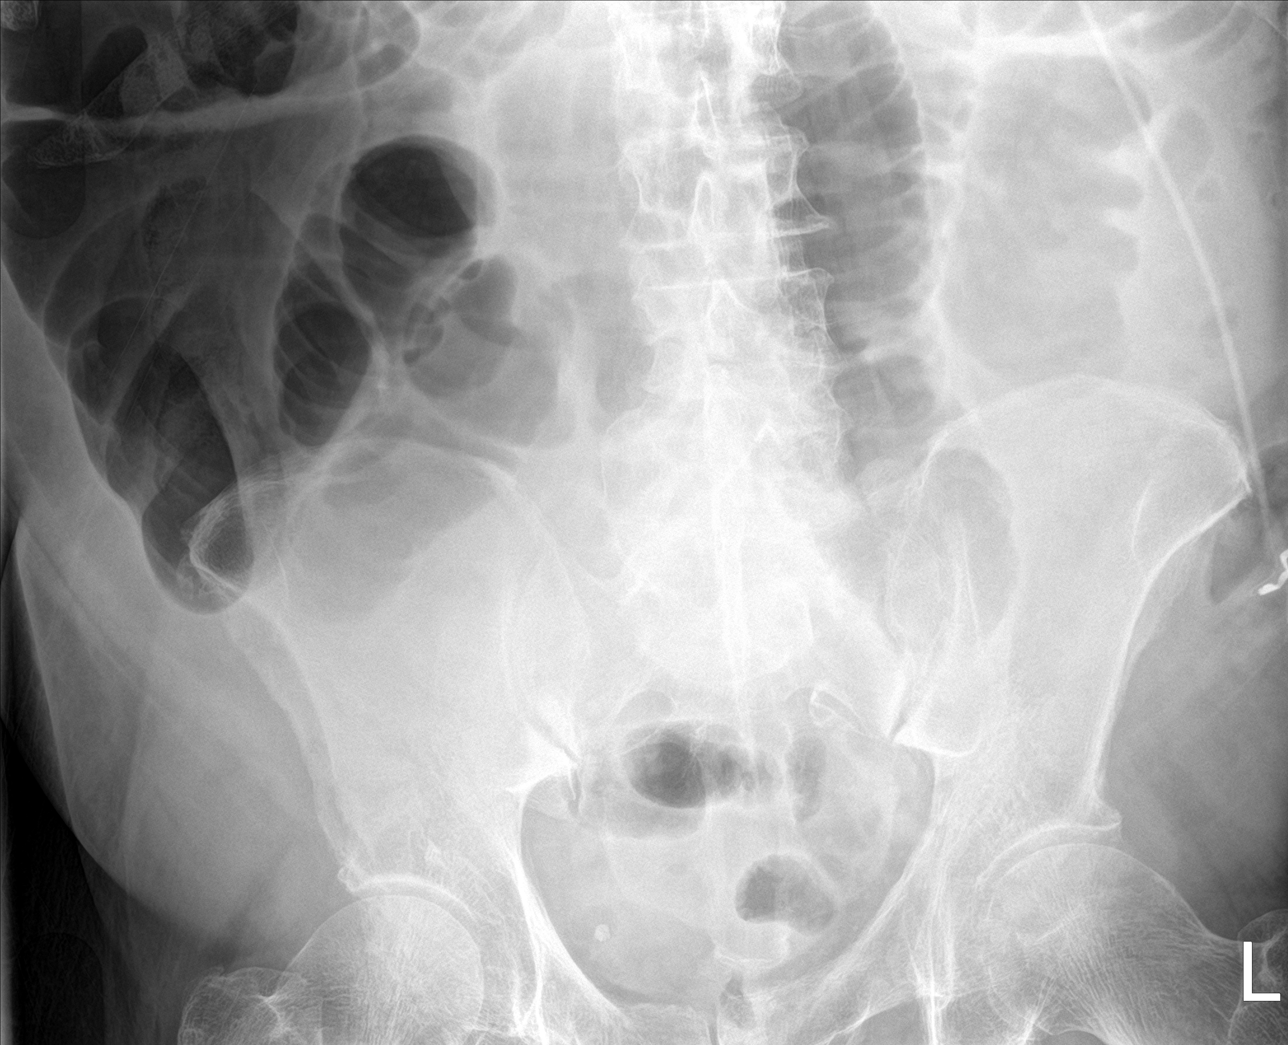

[abdomen decu]
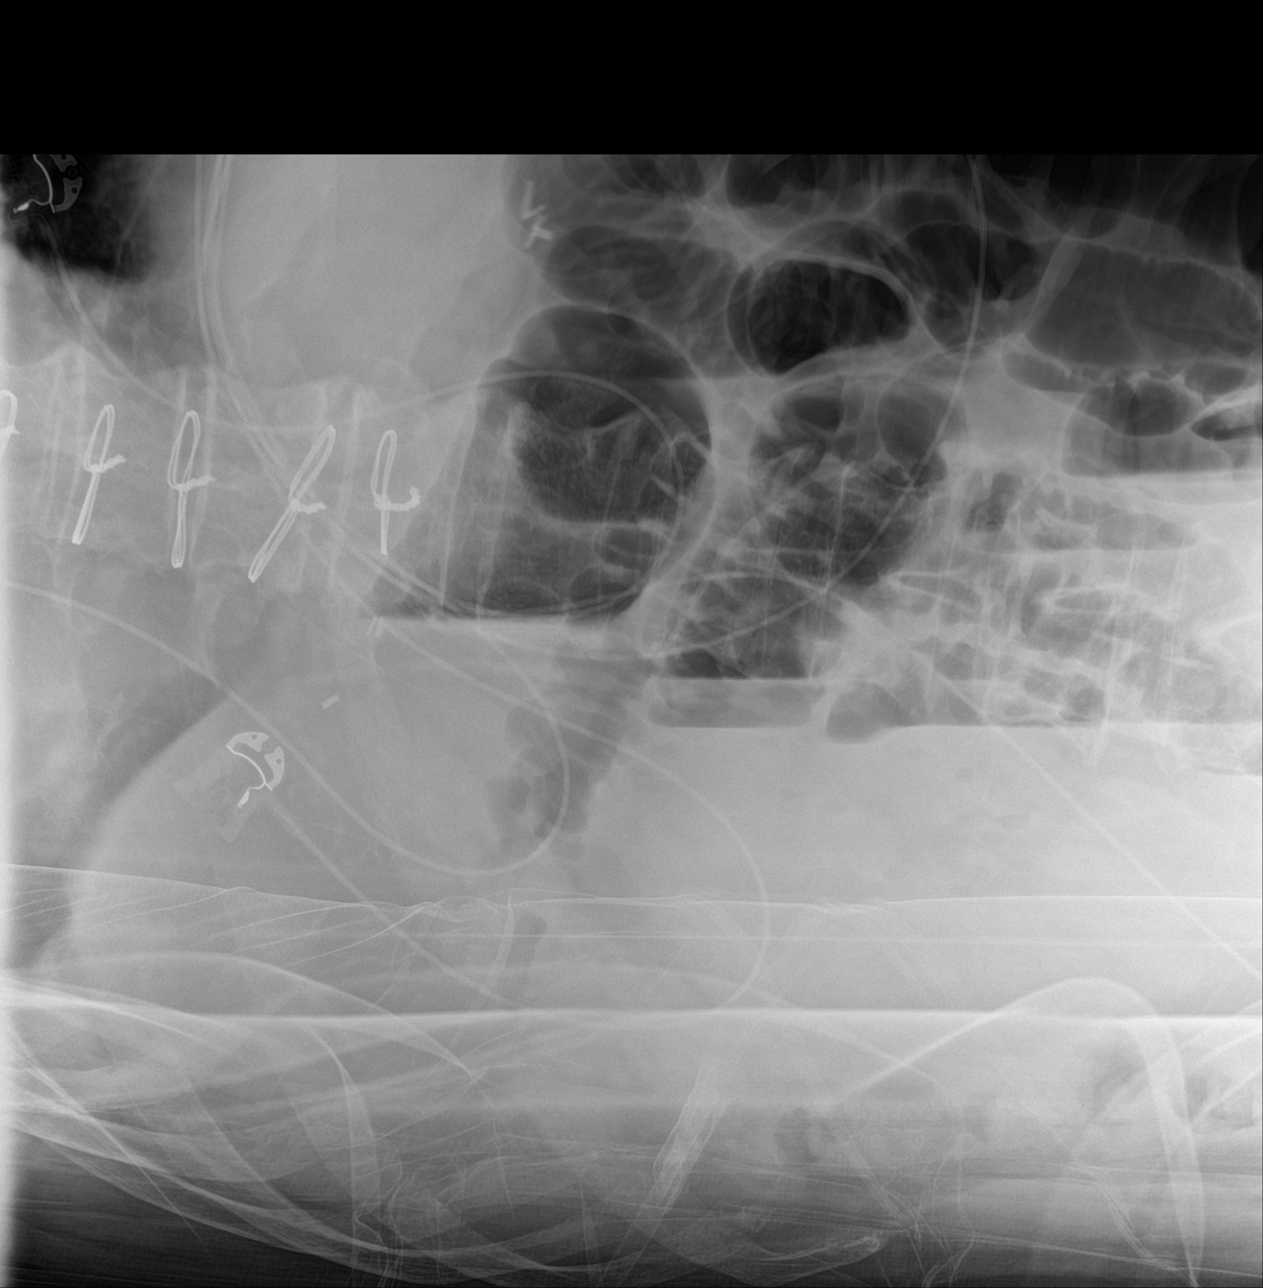

[3 of 3 positions shown; findings below may reference images not displayed]

FINDINGS: Status post cholecystectomy. There remains moderately dilated small
bowel loops with air filled right colon. The degree of small bowel
dilatation is not significantly changed compared to prior exam.
Phleboliths are noted in the pelvis.
IMPRESSION: Stable moderate dilatation of small bowel loops is noted which may
represent ileus, although distal small bowel obstruction cannot be
excluded. Continued radiographic follow-up is recommended.

## 2017-02-28 IMAGING — CR DG CHEST 1V PORT
1 series · 1 of 1 positions shown · non-contrast
Comparison: 04/17/2016

CLINICAL DATA: Shortness of Breath

EXAM:
PORTABLE CHEST 1 VIEW

[AP]
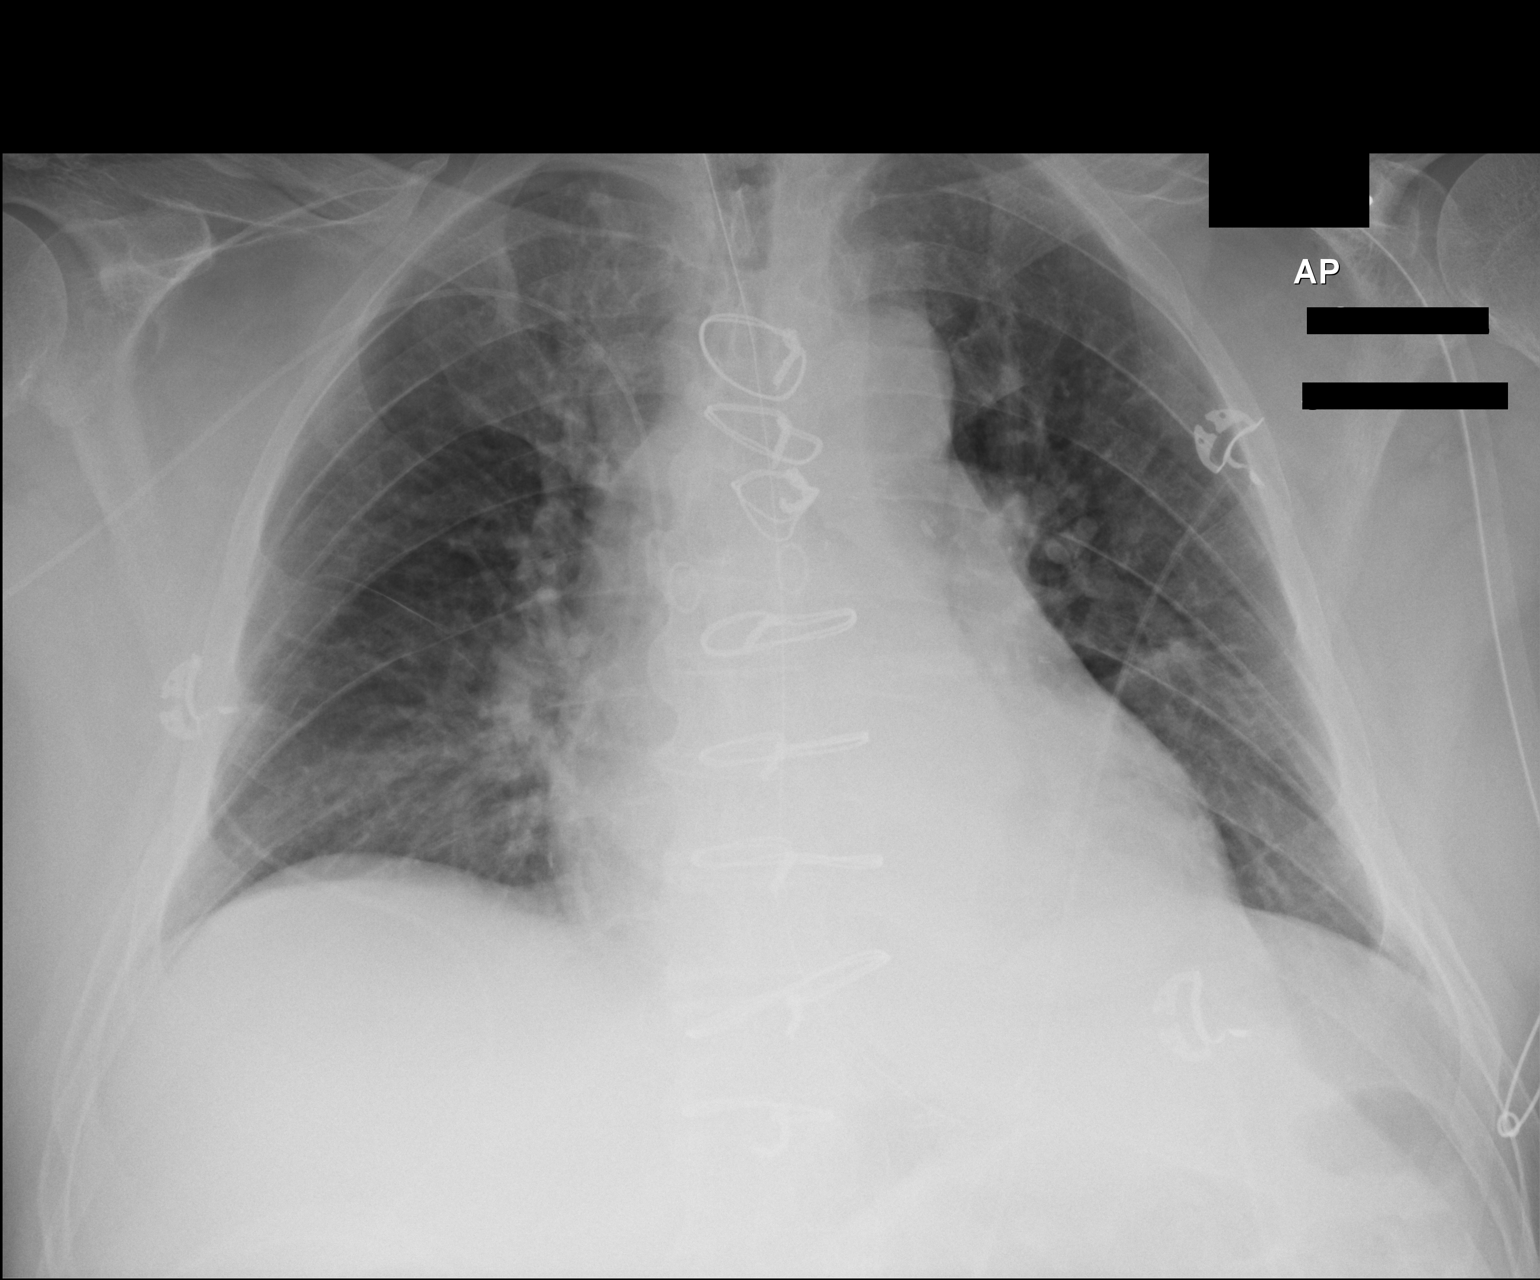

[1 of 1 positions shown; findings below may reference images not displayed]

FINDINGS: Right-sided PICC line and nasogastric catheter are now seen in
satisfactory position. Cardiac shadow is stable. Postoperative
changes are again seen. The lungs are well aerated bilaterally. No
focal infiltrate, effusion or pneumothorax is seen.
IMPRESSION: Tubes and lines as described above.  No acute abnormality noted.

## 2017-02-28 IMAGING — CR DG ABD PORTABLE 1V
1 series · 1 of 1 positions shown · non-contrast
Comparison: Portable abdominal radiograph of April 21, 2016

CLINICAL DATA: Status post [name], abdominal distention, ileus
pattern

EXAM:
PORTABLE ABDOMEN - 1 VIEW

[AP]
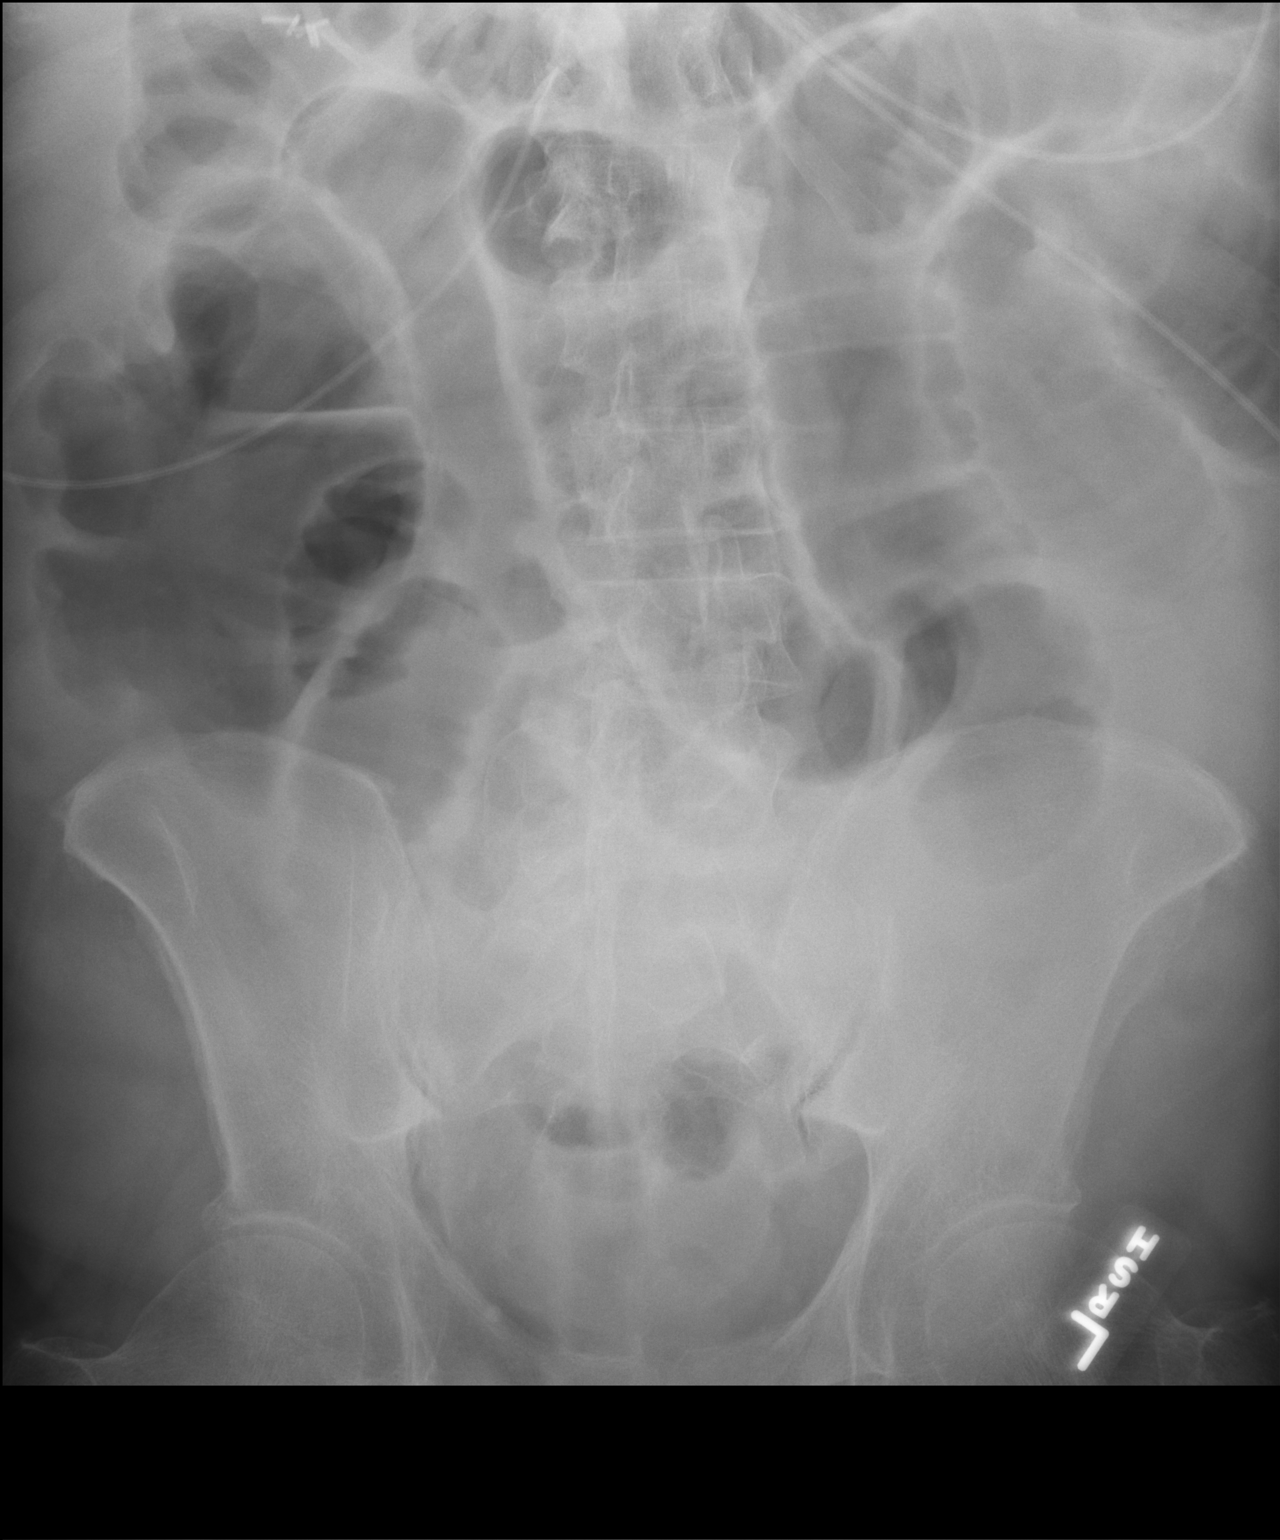

[1 of 1 positions shown; findings below may reference images not displayed]

FINDINGS: There remain loops of moderately distended gas-filled small bowel in
the mid and upper abdomen. There is some gas in the right colon. A
small amount of gas is present in the rectum. The nasogastric tube
is not visible on this study.
IMPRESSION: Persistent ileus. The positioning of the nasogastric tube cannot be
assessed on this abdominal radiograph. However, on the accompanying
portable chest x-ray of today's date the proximal port of the
nasogastric tube can be seen to lie above the expected location of
the GE junction. The tip is just inside the gastric cardia.
Advancement of the nasogastric tube by 15-20 cm is recommended to
assure proper positioning and function of the tube.

## 2017-04-19 ENCOUNTER — Ambulatory Visit (INDEPENDENT_AMBULATORY_CARE_PROVIDER_SITE_OTHER): Payer: Medicare Other | Admitting: Thoracic Surgery (Cardiothoracic Vascular Surgery)

## 2017-04-19 ENCOUNTER — Encounter: Payer: Self-pay | Admitting: Thoracic Surgery (Cardiothoracic Vascular Surgery)

## 2017-04-19 VITALS — BP 105/73 | HR 88 | Resp 16 | Ht 75.0 in | Wt 277.4 lb

## 2017-04-19 DIAGNOSIS — Z951 Presence of aortocoronary bypass graft: Secondary | ICD-10-CM | POA: Diagnosis not present

## 2017-04-19 NOTE — Progress Notes (Signed)
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 He was last seen in our office on 2/23 2017. Since then he has been seen on several occasions by his cardiologist, Dr. Gwen Pounds. He returns to our office today for routine follow-up and reports that overall he is doing well. He denies any symptoms of exertional chest pain or chest tightness such as that which she experienced prior to his surgery. He does get short of breath with activity but this only limited symptoms slightly and overall he is comfortable with all of his ordinary activities. He states that his biggest physical limitations at this point is related to pain in both knees and both lower legs. He describes the pain as burning pain in both calf his ambulation. 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 He underwent a stress test with Dr. Gwen Pounds several months ago and reportedly did acceptably well although the patient states that he was only able to exercise for 3 minutes. His diabetes is under fairly good control. Overall the patient is quite pleased with his progress of then his legs.   Current Outpatient Prescriptions  Medication Sig Dispense Refill  . atorvastatin (LIPITOR) 80 MG tablet Take 1 tablet (80 mg total) by mouth daily at 6 PM. 30 tablet 1  . canagliflozin (INVOKANA) 300 MG TABS tablet Take 300 mg by mouth daily before breakfast. Reported on 04/13/2016    . carvedilol (COREG) 6.25 MG tablet Take 1 tablet (6.25 mg total) by mouth 2 (two) times daily with a meal. 60 tablet 1    . clopidogrel (PLAVIX) 75 MG tablet Take 1 tablet (75 mg total) by mouth daily. 30 tablet 1  . gemfibrozil (LOPID) 600 MG tablet Take 600 mg by mouth 2 (two) times daily before a meal.    . glimepiride (AMARYL) 4 MG tablet Take 4 mg by mouth 2 (two) times daily. Reported on 04/13/2016    . losartan-hydrochlorothiazide (HYZAAR) 100-25 MG tablet Take 0.5 tablets by mouth every evening. 30 tablet 1  . metFORMIN (GLUCOPHAGE-XR) 750 MG 24 hr tablet Take 750 mg by mouth daily with breakfast. Reported on 04/13/2016     No current facility-administered medications for this visit.       Physical Exam:   BP 105/73 (BP Location: Right Arm, Patient Position: Sitting, Cuff Size: Large)   Pulse 88   Resp 16   Ht 6\' 3"  (1.905 m)   Wt 277 lb 6.4 oz (125.8 kg)   SpO2 96% Comment: ON RA  BMI 34.67 kg/m   General:  Well-appearing  Chest:   Clear to auscultation  CV:   Regular rate and rhythm without murmur  Incisions:  Completely healed, sternum is stable  Abdomen:  Soft and nontender  Extremities:  Warm and well-perfused  Diagnostic Tests:  n/a   Impression:  Patient is doing well approximately one year status post coronary artery bypass grafting  Plan:  We have not recommended any changes to the patient's current  medications. The patient has been reminded regarding the many benefits of regular exercise, heart healthy diet, strict attention to management of diabetes.  He continues to follow up regularly with Dr. Gwen PoundsKowalski and with Lonie PeakNathan Conroy for long-term management of his medical problems. If he continues to have problems with exertional tightness in both lower legs he will discuss with Dr. Gwen PoundsKowalski whether not noninvasive testing for circulatory problems might be appropriate. All of his questions have been addressed.  I spent in excess of 15 minutes during the conduct of this office consultation and >50% of this time involved direct face-to-face encounter with the patient for counseling  and/or coordination of their care.    Salvatore Decentlarence H. Cornelius Moraswen, MD 04/19/2017 12:38 PM

## 2017-04-19 NOTE — Patient Instructions (Signed)
Continue all previous medications without any changes at this time  You may resume unrestricted physical activity without any particular limitations at this time.  Make every effort to keep your diabetes under very tight control.  Follow up closely with your primary care physician or endocrinologist and strive to keep their hemoglobin A1c levels as low as possible, preferably near or below 6.0.  The long term benefits of strict control of diabetes are far reaching and critically important for your overall health and survival.  Make every effort to stay physically active, get some type of exercise on a regular basis, and stick to a "heart healthy diet".  The long term benefits for regular exercise and a healthy diet are critically important to your overall health and wellbeing.  

## 2017-04-23 DIAGNOSIS — E782 Mixed hyperlipidemia: Secondary | ICD-10-CM | POA: Diagnosis not present

## 2017-04-23 DIAGNOSIS — I10 Essential (primary) hypertension: Secondary | ICD-10-CM | POA: Diagnosis not present

## 2017-04-23 DIAGNOSIS — I25810 Atherosclerosis of coronary artery bypass graft(s) without angina pectoris: Secondary | ICD-10-CM | POA: Diagnosis not present

## 2017-06-16 DIAGNOSIS — E782 Mixed hyperlipidemia: Secondary | ICD-10-CM | POA: Diagnosis not present

## 2017-06-16 DIAGNOSIS — Z6834 Body mass index (BMI) 34.0-34.9, adult: Secondary | ICD-10-CM | POA: Diagnosis not present

## 2017-06-16 DIAGNOSIS — M1710 Unilateral primary osteoarthritis, unspecified knee: Secondary | ICD-10-CM | POA: Diagnosis not present

## 2017-06-16 DIAGNOSIS — Z79899 Other long term (current) drug therapy: Secondary | ICD-10-CM | POA: Diagnosis not present

## 2017-06-16 DIAGNOSIS — I2510 Atherosclerotic heart disease of native coronary artery without angina pectoris: Secondary | ICD-10-CM | POA: Diagnosis not present

## 2017-06-16 DIAGNOSIS — I10 Essential (primary) hypertension: Secondary | ICD-10-CM | POA: Diagnosis not present

## 2017-06-16 DIAGNOSIS — E119 Type 2 diabetes mellitus without complications: Secondary | ICD-10-CM | POA: Diagnosis not present

## 2017-06-16 DIAGNOSIS — Z125 Encounter for screening for malignant neoplasm of prostate: Secondary | ICD-10-CM | POA: Diagnosis not present

## 2017-06-23 DIAGNOSIS — Z9181 History of falling: Secondary | ICD-10-CM | POA: Diagnosis not present

## 2017-06-23 DIAGNOSIS — Z125 Encounter for screening for malignant neoplasm of prostate: Secondary | ICD-10-CM | POA: Diagnosis not present

## 2017-06-23 DIAGNOSIS — Z1389 Encounter for screening for other disorder: Secondary | ICD-10-CM | POA: Diagnosis not present

## 2017-06-23 DIAGNOSIS — E785 Hyperlipidemia, unspecified: Secondary | ICD-10-CM | POA: Diagnosis not present

## 2017-06-23 DIAGNOSIS — Z136 Encounter for screening for cardiovascular disorders: Secondary | ICD-10-CM | POA: Diagnosis not present

## 2017-06-23 DIAGNOSIS — E669 Obesity, unspecified: Secondary | ICD-10-CM | POA: Diagnosis not present

## 2017-06-23 DIAGNOSIS — Z0000 Encounter for general adult medical examination without abnormal findings: Secondary | ICD-10-CM | POA: Diagnosis not present

## 2017-06-23 DIAGNOSIS — Z6834 Body mass index (BMI) 34.0-34.9, adult: Secondary | ICD-10-CM | POA: Diagnosis not present

## 2017-06-23 DIAGNOSIS — Z1211 Encounter for screening for malignant neoplasm of colon: Secondary | ICD-10-CM | POA: Diagnosis not present

## 2017-07-13 DIAGNOSIS — Z1211 Encounter for screening for malignant neoplasm of colon: Secondary | ICD-10-CM | POA: Diagnosis not present

## 2017-07-13 DIAGNOSIS — Z1212 Encounter for screening for malignant neoplasm of rectum: Secondary | ICD-10-CM | POA: Diagnosis not present

## 2017-08-19 DIAGNOSIS — Z23 Encounter for immunization: Secondary | ICD-10-CM | POA: Diagnosis not present

## 2017-10-06 DIAGNOSIS — Z79899 Other long term (current) drug therapy: Secondary | ICD-10-CM | POA: Diagnosis not present

## 2017-10-06 DIAGNOSIS — I2510 Atherosclerotic heart disease of native coronary artery without angina pectoris: Secondary | ICD-10-CM | POA: Diagnosis not present

## 2017-10-06 DIAGNOSIS — I10 Essential (primary) hypertension: Secondary | ICD-10-CM | POA: Diagnosis not present

## 2017-10-06 DIAGNOSIS — E119 Type 2 diabetes mellitus without complications: Secondary | ICD-10-CM | POA: Diagnosis not present

## 2017-10-06 DIAGNOSIS — E785 Hyperlipidemia, unspecified: Secondary | ICD-10-CM | POA: Diagnosis not present

## 2017-12-09 DIAGNOSIS — I25810 Atherosclerosis of coronary artery bypass graft(s) without angina pectoris: Secondary | ICD-10-CM | POA: Diagnosis not present

## 2017-12-09 DIAGNOSIS — E782 Mixed hyperlipidemia: Secondary | ICD-10-CM | POA: Diagnosis not present

## 2017-12-09 DIAGNOSIS — I10 Essential (primary) hypertension: Secondary | ICD-10-CM | POA: Diagnosis not present

## 2018-01-20 DIAGNOSIS — I2510 Atherosclerotic heart disease of native coronary artery without angina pectoris: Secondary | ICD-10-CM | POA: Diagnosis not present

## 2018-01-20 DIAGNOSIS — J019 Acute sinusitis, unspecified: Secondary | ICD-10-CM | POA: Diagnosis not present

## 2018-02-07 DIAGNOSIS — Z6834 Body mass index (BMI) 34.0-34.9, adult: Secondary | ICD-10-CM | POA: Diagnosis not present

## 2018-02-07 DIAGNOSIS — E119 Type 2 diabetes mellitus without complications: Secondary | ICD-10-CM | POA: Diagnosis not present

## 2018-02-07 DIAGNOSIS — I2510 Atherosclerotic heart disease of native coronary artery without angina pectoris: Secondary | ICD-10-CM | POA: Diagnosis not present

## 2018-02-07 DIAGNOSIS — E782 Mixed hyperlipidemia: Secondary | ICD-10-CM | POA: Diagnosis not present

## 2018-02-07 DIAGNOSIS — I10 Essential (primary) hypertension: Secondary | ICD-10-CM | POA: Diagnosis not present

## 2018-06-08 DIAGNOSIS — I872 Venous insufficiency (chronic) (peripheral): Secondary | ICD-10-CM | POA: Diagnosis not present

## 2018-06-08 DIAGNOSIS — I10 Essential (primary) hypertension: Secondary | ICD-10-CM | POA: Diagnosis not present

## 2018-06-08 DIAGNOSIS — I25810 Atherosclerosis of coronary artery bypass graft(s) without angina pectoris: Secondary | ICD-10-CM | POA: Diagnosis not present

## 2018-06-08 DIAGNOSIS — M25561 Pain in right knee: Secondary | ICD-10-CM | POA: Diagnosis not present

## 2018-06-08 DIAGNOSIS — E782 Mixed hyperlipidemia: Secondary | ICD-10-CM | POA: Diagnosis not present

## 2018-06-10 DIAGNOSIS — Z125 Encounter for screening for malignant neoplasm of prostate: Secondary | ICD-10-CM | POA: Diagnosis not present

## 2018-06-10 DIAGNOSIS — I2510 Atherosclerotic heart disease of native coronary artery without angina pectoris: Secondary | ICD-10-CM | POA: Diagnosis not present

## 2018-06-10 DIAGNOSIS — E782 Mixed hyperlipidemia: Secondary | ICD-10-CM | POA: Diagnosis not present

## 2018-06-10 DIAGNOSIS — E119 Type 2 diabetes mellitus without complications: Secondary | ICD-10-CM | POA: Diagnosis not present

## 2018-06-10 DIAGNOSIS — I10 Essential (primary) hypertension: Secondary | ICD-10-CM | POA: Diagnosis not present

## 2018-06-10 DIAGNOSIS — Z1339 Encounter for screening examination for other mental health and behavioral disorders: Secondary | ICD-10-CM | POA: Diagnosis not present

## 2018-06-22 DIAGNOSIS — M25561 Pain in right knee: Secondary | ICD-10-CM | POA: Diagnosis not present

## 2018-06-22 DIAGNOSIS — M19011 Primary osteoarthritis, right shoulder: Secondary | ICD-10-CM | POA: Diagnosis not present

## 2018-06-22 DIAGNOSIS — M25511 Pain in right shoulder: Secondary | ICD-10-CM | POA: Diagnosis not present

## 2018-06-22 DIAGNOSIS — M7581 Other shoulder lesions, right shoulder: Secondary | ICD-10-CM | POA: Diagnosis not present

## 2018-06-22 DIAGNOSIS — M1711 Unilateral primary osteoarthritis, right knee: Secondary | ICD-10-CM | POA: Diagnosis not present

## 2018-07-12 DIAGNOSIS — Z6834 Body mass index (BMI) 34.0-34.9, adult: Secondary | ICD-10-CM | POA: Diagnosis not present

## 2018-07-12 DIAGNOSIS — I4892 Unspecified atrial flutter: Secondary | ICD-10-CM | POA: Diagnosis not present

## 2018-07-12 DIAGNOSIS — I2510 Atherosclerotic heart disease of native coronary artery without angina pectoris: Secondary | ICD-10-CM | POA: Diagnosis not present

## 2018-07-12 DIAGNOSIS — R609 Edema, unspecified: Secondary | ICD-10-CM | POA: Diagnosis not present

## 2018-07-12 DIAGNOSIS — Z79899 Other long term (current) drug therapy: Secondary | ICD-10-CM | POA: Diagnosis not present

## 2018-07-18 DIAGNOSIS — E782 Mixed hyperlipidemia: Secondary | ICD-10-CM | POA: Diagnosis not present

## 2018-07-18 DIAGNOSIS — I10 Essential (primary) hypertension: Secondary | ICD-10-CM | POA: Diagnosis not present

## 2018-07-18 DIAGNOSIS — I872 Venous insufficiency (chronic) (peripheral): Secondary | ICD-10-CM | POA: Diagnosis not present

## 2018-07-18 DIAGNOSIS — I25810 Atherosclerosis of coronary artery bypass graft(s) without angina pectoris: Secondary | ICD-10-CM | POA: Diagnosis not present

## 2018-07-18 DIAGNOSIS — I4892 Unspecified atrial flutter: Secondary | ICD-10-CM | POA: Diagnosis not present

## 2018-07-26 DIAGNOSIS — I5021 Acute systolic (congestive) heart failure: Secondary | ICD-10-CM | POA: Diagnosis not present

## 2018-07-26 DIAGNOSIS — I480 Paroxysmal atrial fibrillation: Secondary | ICD-10-CM | POA: Diagnosis not present

## 2018-07-26 DIAGNOSIS — I4892 Unspecified atrial flutter: Secondary | ICD-10-CM | POA: Diagnosis not present

## 2018-07-26 DIAGNOSIS — Z79899 Other long term (current) drug therapy: Secondary | ICD-10-CM | POA: Diagnosis not present

## 2018-07-26 DIAGNOSIS — I25810 Atherosclerosis of coronary artery bypass graft(s) without angina pectoris: Secondary | ICD-10-CM | POA: Diagnosis not present

## 2018-07-28 DIAGNOSIS — Z79899 Other long term (current) drug therapy: Secondary | ICD-10-CM | POA: Diagnosis not present

## 2018-07-28 DIAGNOSIS — I480 Paroxysmal atrial fibrillation: Secondary | ICD-10-CM | POA: Diagnosis not present

## 2018-07-28 DIAGNOSIS — I4892 Unspecified atrial flutter: Secondary | ICD-10-CM | POA: Diagnosis not present

## 2018-08-02 ENCOUNTER — Encounter
Admission: RE | Disposition: A | Discharge status: Home / Self Care | Payer: Self-pay | Source: Ambulatory Visit | Attending: Internal Medicine

## 2018-08-02 ENCOUNTER — Ambulatory Visit
Admission: RE | Admit: 2018-08-02 | Discharge: 2018-08-02 | Disposition: A | Discharge status: Home / Self Care | Payer: Medicare Other | Source: Ambulatory Visit | Attending: Internal Medicine | Admitting: Internal Medicine

## 2018-08-02 ENCOUNTER — Ambulatory Visit: Payer: Medicare Other | Admitting: Anesthesiology

## 2018-08-02 DIAGNOSIS — I4891 Unspecified atrial fibrillation: Secondary | ICD-10-CM | POA: Insufficient documentation

## 2018-08-02 DIAGNOSIS — Z7901 Long term (current) use of anticoagulants: Secondary | ICD-10-CM | POA: Diagnosis not present

## 2018-08-02 DIAGNOSIS — I110 Hypertensive heart disease with heart failure: Secondary | ICD-10-CM | POA: Diagnosis not present

## 2018-08-02 DIAGNOSIS — Z951 Presence of aortocoronary bypass graft: Secondary | ICD-10-CM | POA: Diagnosis not present

## 2018-08-02 DIAGNOSIS — M1990 Unspecified osteoarthritis, unspecified site: Secondary | ICD-10-CM | POA: Insufficient documentation

## 2018-08-02 DIAGNOSIS — E119 Type 2 diabetes mellitus without complications: Secondary | ICD-10-CM | POA: Diagnosis not present

## 2018-08-02 DIAGNOSIS — I480 Paroxysmal atrial fibrillation: Secondary | ICD-10-CM | POA: Diagnosis not present

## 2018-08-02 DIAGNOSIS — Z79899 Other long term (current) drug therapy: Secondary | ICD-10-CM | POA: Diagnosis not present

## 2018-08-02 DIAGNOSIS — I509 Heart failure, unspecified: Secondary | ICD-10-CM | POA: Insufficient documentation

## 2018-08-02 DIAGNOSIS — Z880 Allergy status to penicillin: Secondary | ICD-10-CM | POA: Insufficient documentation

## 2018-08-02 DIAGNOSIS — E782 Mixed hyperlipidemia: Secondary | ICD-10-CM | POA: Insufficient documentation

## 2018-08-02 DIAGNOSIS — Z886 Allergy status to analgesic agent status: Secondary | ICD-10-CM | POA: Insufficient documentation

## 2018-08-02 DIAGNOSIS — Z888 Allergy status to other drugs, medicaments and biological substances status: Secondary | ICD-10-CM | POA: Insufficient documentation

## 2018-08-02 DIAGNOSIS — K219 Gastro-esophageal reflux disease without esophagitis: Secondary | ICD-10-CM | POA: Insufficient documentation

## 2018-08-02 DIAGNOSIS — I25119 Atherosclerotic heart disease of native coronary artery with unspecified angina pectoris: Secondary | ICD-10-CM | POA: Diagnosis not present

## 2018-08-02 HISTORY — PX: CARDIOVERSION: EP1203

## 2018-08-02 LAB — GLUCOSE, CAPILLARY: Glucose-Capillary: 152 mg/dL — ABNORMAL HIGH (ref 70–99)

## 2018-08-02 SURGERY — CARDIOVERSION (CATH LAB)
Anesthesia: General

## 2018-08-02 MED ORDER — LIDOCAINE HCL (CARDIAC) PF 100 MG/5ML IV SOSY
PREFILLED_SYRINGE | INTRAVENOUS | Status: DC | PRN
  Administered 2018-08-02: 50 mg via INTRATRACHEAL

## 2018-08-02 MED ORDER — AMIODARONE HCL 200 MG PO TABS: 200.0000 mg | ORAL_TABLET | Freq: Two times a day (BID) | ORAL | 4 refills | Status: DC

## 2018-08-02 MED ORDER — SODIUM CHLORIDE 0.9 % IV SOLN
INTRAVENOUS | Status: DC
  Administered 2018-08-02: 08:00:00 via INTRAVENOUS

## 2018-08-02 MED ORDER — PROPOFOL 10 MG/ML IV BOLUS
INTRAVENOUS | Status: DC | PRN
  Administered 2018-08-02: 60 mg via INTRAVENOUS
  Administered 2018-08-02: 30 mg via INTRAVENOUS

## 2018-08-02 NOTE — Anesthesia Preprocedure Evaluation (Addendum)
Anesthesia Evaluation  Patient identified by MRN, date of birth, ID band Patient awake    Reviewed: Allergy & Precautions, H&P , NPO status , Patient's Chart, lab work & pertinent test results  Airway Mallampati: II  TM Distance: >3 FB    Comment: Large beard Dental  (+) Edentulous Lower, Edentulous Upper   Pulmonary neg pulmonary ROS,    breath sounds clear to auscultation       Cardiovascular Exercise Tolerance: Poor hypertension, + angina + CAD, + [name] (2017) and +CHF  + dysrhythmias Atrial Fibrillation  Rhythm:irregular   LM lesion, 99% stenosed.  Ost Cx lesion, 99% stenosed.  Prox LAD lesion, 99% stenosed.  Ost 1st Diag lesion, 55% stenosed.  Mid LAD lesion, 100% stenosed.  Prox RCA lesion, 75% stenosed.  Mid RCA lesion, 45% stenosed.  Dist RCA lesion, 50% stenosed.  RPDA lesion, 45% stenosed.   Assessment The patient has had progressive canadian class 4 anginal symptoms with  risk factors including diabetes, high blood pressure and high cholesterol.  abnormal left ventricular function with ejection fraction of 45% with hypokinesis of anterior wall  severe 3 vessel coronary artery disease   There is significant stenosis of left main, lcx and lad with heavy calcium   Neuro/Psych negative neurological ROS  negative psych ROS   GI/Hepatic negative GI ROS, Neg liver ROS, GERD  ,  Endo/Other  negative endocrine ROSdiabetes  Renal/GU negative Renal ROS  negative genitourinary   Musculoskeletal  (+) Arthritis ,   Abdominal   Peds  Hematology negative hematology ROS (+)   Anesthesia Other Findings Past Medical History: No date: Arthritis 04/13/2016: Coronary artery disease involving native coronary artery  with unstable angina pectoris (HCC) No date: Cough     Comment:  CHRONIC No date: Diabetes mellitus without complication (HCC) No date: Edema     Comment:  FEET/LEGS No date: Essential  hypertension No date: GERD (gastroesophageal reflux disease) No date: Hypertension 04/13/2016: Left main coronary artery disease No date: Mixed hyperlipidemia No date: Obesity 04/14/2016: S/P [name]     Comment:  LIMA to LAD, SVG to D1, SVG to OM, SVG to PDA, EVH via               right thigh and leg No date: Type II diabetes mellitus (HCC) 04/13/2016: Unstable angina (HCC)  Past Surgical History: No date: BACK SURGERY     Comment:  Redge Gainer, Ginette Otto 04/13/2016: CARDIAC CATHETERIZATION; Left     Comment:  Procedure: Left Heart Cath and Coronary Angiography;                Surgeon: [name], MD;  Location: [facility] INVASIVE               CV LAB;  Service: Cardiovascular;  Laterality: Left; 01/23/2016: CATARACT EXTRACTION W/PHACO; Right     Comment:  Procedure: CATARACT EXTRACTION PHACO AND INTRAOCULAR               LENS PLACEMENT (IOC);  Surgeon: Galen Manila, MD;                Location: [facility] ORS;  Service: Ophthalmology;  Laterality:              Right;  [place] 01:18 AP% 23.8 CDE 18.67 fluid pack lot #               [id number] No date: CHOLECYSTECTOMY 04/14/2016: CORONARY ARTERY BYPASS GRAFT; N/A     Comment:  Procedure: CORONARY  ARTERY BYPASS GRAFTING TIMES FOUR                 USING LEFT INTERNAL MAMMARY AND ENDOSCOPIC HARVEST RIGHT               SAPHENOUS VEIN;  Surgeon: Purcell Nails, MD;  [facility];  Service: Open Heart Surgery;  Laterality: N/A; No date: EYE SURGERY; Left     Comment:  Cataract Extraction with IOL implant 04/14/2016: INTRAOPERATIVE TRANSESOPHAGEAL ECHOCARDIOGRAM; N/A     Comment:  Procedure: INTRAOPERATIVE TRANSESOPHAGEAL               ECHOCARDIOGRAM;  Surgeon: Purcell Nails, MD;  Location:              [facility] OR;  Service: Open Heart Surgery;  Laterality: N/A; 10/08/2015: KNEE ARTHROSCOPY; Left     Comment:  Procedure: ARTHROSCOPY KNEE, PARTIAL MEDIAL AND LATERAL               MENISECTOMY, REMOVAL OF FOREIGN BODY;  Surgeon:  Kennedy Bucker, MD;  Location: [facility] ORS;  Service: Orthopedics;                Laterality: Left;  BMI    Body Mass Index:  33.84 kg/m      Reproductive/Obstetrics negative OB ROS                            Anesthesia Physical Anesthesia Plan  ASA: IV  Anesthesia Plan: General   Post-op Pain Management:    Induction:   PONV Risk Score and Plan: Propofol infusion and TIVA  Airway Management Planned:   Additional Equipment:   Intra-op Plan:   Post-operative Plan:   Informed Consent: I have reviewed the patients History and Physical, chart, labs and discussed the procedure including the risks, benefits and alternatives for the proposed anesthesia with the patient or authorized representative who has indicated his/her understanding and acceptance.   Dental Advisory Given  Plan Discussed with: Anesthesiologist, CRNA and Surgeon  Anesthesia Plan Comments:        Anesthesia Quick Evaluation

## 2018-08-02 NOTE — Transfer of Care (Signed)
Immediate Anesthesia Transfer of Care Note  Patient: Jason [name]  Procedure(s) Performed: CARDIOVERSION (N/A )  Patient Location: PACU and Cath Lab  Anesthesia Type:MAC  Level of Consciousness: drowsy  Airway & Oxygen Therapy: Patient Spontanous Breathing and Patient connected to face mask oxygen  Post-op Assessment: Report given to RN and Post -op Vital signs reviewed and stable  Post vital signs: Reviewed and stable  Last Vitals:  Vitals Value Taken Time  BP 93/62 08/02/2018  8:05 AM  Temp    Pulse 76 08/02/2018  8:06 AM  Resp 23 08/02/2018  8:06 AM  SpO2 75 % 08/02/2018  8:06 AM    Last Pain:  Vitals:   08/02/18 0717  TempSrc: Oral         Complications: No apparent anesthesia complications

## 2018-08-02 NOTE — Anesthesia Postprocedure Evaluation (Signed)
Anesthesia Post Note  Patient: Jason [name]  Procedure(s) Performed: CARDIOVERSION (N/A )  Patient location during evaluation: Cath Lab Anesthesia Type: General Level of consciousness: awake and alert Pain management: pain level controlled Vital Signs Assessment: post-procedure vital signs reviewed and stable Respiratory status: spontaneous breathing, nonlabored ventilation, respiratory function stable and patient connected to nasal cannula oxygen Cardiovascular status: blood pressure returned to baseline and stable Postop Assessment: no apparent nausea or vomiting Anesthetic complications: no     Last Vitals:  Vitals:   08/02/18 0830 08/02/18 0844  BP: 110/70 110/69  Pulse: 80 82  Resp: 19 18  Temp:    SpO2: 99% 96%    Last Pain:  Vitals:   08/02/18 0717  TempSrc: Oral                 Cleda Mccreedy [name]

## 2018-08-02 NOTE — Anesthesia Post-op Follow-up Note (Signed)
Anesthesia QCDR form completed.        

## 2018-08-02 NOTE — CV Procedure (Signed)
Electrical Cardioversion Procedure Note Jason [name] [id number] 03/14/1951  Procedure: Electrical Cardioversion Indications:  Atrial Fibrillation  Procedure Details Consent: Risks of procedure as well as the alternatives and risks of each were explained to the (patient/caregiver).  Consent for procedure obtained. Time Out: Verified patient identification, verified procedure, site/side was marked, verified correct patient position, special equipment/implants available, medications/allergies/relevent history reviewed, required imaging and test results available.  Performed  Patient placed on cardiac monitor, pulse oximetry, supplemental oxygen as necessary.  Sedation given: Benzodiazepines and Short-acting barbiturates Pacer pads placed anterior and posterior chest.  Cardioverted 1 time(s).  Cardioverted at 120J.  Evaluation Findings: Post procedure EKG shows: NSR Complications: None Patient did tolerate procedure well.   Jason [name] 08/02/2018, 8:04 AM

## 2018-08-09 DIAGNOSIS — I4891 Unspecified atrial fibrillation: Secondary | ICD-10-CM | POA: Diagnosis not present

## 2018-08-09 DIAGNOSIS — I480 Paroxysmal atrial fibrillation: Secondary | ICD-10-CM | POA: Diagnosis not present

## 2018-08-09 DIAGNOSIS — I5022 Chronic systolic (congestive) heart failure: Secondary | ICD-10-CM | POA: Diagnosis not present

## 2018-08-09 DIAGNOSIS — I25810 Atherosclerosis of coronary artery bypass graft(s) without angina pectoris: Secondary | ICD-10-CM | POA: Diagnosis not present

## 2018-09-05 DIAGNOSIS — I10 Essential (primary) hypertension: Secondary | ICD-10-CM | POA: Diagnosis not present

## 2018-09-05 DIAGNOSIS — I480 Paroxysmal atrial fibrillation: Secondary | ICD-10-CM | POA: Diagnosis not present

## 2018-09-05 DIAGNOSIS — I5022 Chronic systolic (congestive) heart failure: Secondary | ICD-10-CM | POA: Diagnosis not present

## 2018-09-05 DIAGNOSIS — I25810 Atherosclerosis of coronary artery bypass graft(s) without angina pectoris: Secondary | ICD-10-CM | POA: Diagnosis not present

## 2018-09-12 DIAGNOSIS — E785 Hyperlipidemia, unspecified: Secondary | ICD-10-CM | POA: Diagnosis not present

## 2018-09-12 DIAGNOSIS — Z1331 Encounter for screening for depression: Secondary | ICD-10-CM | POA: Diagnosis not present

## 2018-09-12 DIAGNOSIS — Z139 Encounter for screening, unspecified: Secondary | ICD-10-CM | POA: Diagnosis not present

## 2018-09-12 DIAGNOSIS — E669 Obesity, unspecified: Secondary | ICD-10-CM | POA: Diagnosis not present

## 2018-09-12 DIAGNOSIS — Z136 Encounter for screening for cardiovascular disorders: Secondary | ICD-10-CM | POA: Diagnosis not present

## 2018-09-12 DIAGNOSIS — Z9181 History of falling: Secondary | ICD-10-CM | POA: Diagnosis not present

## 2018-09-12 DIAGNOSIS — Z23 Encounter for immunization: Secondary | ICD-10-CM | POA: Diagnosis not present

## 2018-09-12 DIAGNOSIS — Z6833 Body mass index (BMI) 33.0-33.9, adult: Secondary | ICD-10-CM | POA: Diagnosis not present

## 2018-09-12 DIAGNOSIS — Z125 Encounter for screening for malignant neoplasm of prostate: Secondary | ICD-10-CM | POA: Diagnosis not present

## 2018-09-12 DIAGNOSIS — Z0000 Encounter for general adult medical examination without abnormal findings: Secondary | ICD-10-CM | POA: Diagnosis not present

## 2018-09-19 DIAGNOSIS — E782 Mixed hyperlipidemia: Secondary | ICD-10-CM | POA: Diagnosis not present

## 2018-09-19 DIAGNOSIS — I25810 Atherosclerosis of coronary artery bypass graft(s) without angina pectoris: Secondary | ICD-10-CM | POA: Diagnosis not present

## 2018-09-19 DIAGNOSIS — I480 Paroxysmal atrial fibrillation: Secondary | ICD-10-CM | POA: Diagnosis not present

## 2018-09-19 DIAGNOSIS — I5022 Chronic systolic (congestive) heart failure: Secondary | ICD-10-CM | POA: Diagnosis not present

## 2018-09-27 DIAGNOSIS — I5022 Chronic systolic (congestive) heart failure: Secondary | ICD-10-CM | POA: Diagnosis not present

## 2018-10-11 DIAGNOSIS — E782 Mixed hyperlipidemia: Secondary | ICD-10-CM | POA: Diagnosis not present

## 2018-10-11 DIAGNOSIS — I480 Paroxysmal atrial fibrillation: Secondary | ICD-10-CM | POA: Diagnosis not present

## 2018-10-11 DIAGNOSIS — I25810 Atherosclerosis of coronary artery bypass graft(s) without angina pectoris: Secondary | ICD-10-CM | POA: Diagnosis not present

## 2018-10-11 DIAGNOSIS — I10 Essential (primary) hypertension: Secondary | ICD-10-CM | POA: Diagnosis not present

## 2018-10-11 DIAGNOSIS — I5022 Chronic systolic (congestive) heart failure: Secondary | ICD-10-CM | POA: Diagnosis not present

## 2018-10-18 DIAGNOSIS — E119 Type 2 diabetes mellitus without complications: Secondary | ICD-10-CM | POA: Diagnosis not present

## 2018-10-18 DIAGNOSIS — I4891 Unspecified atrial fibrillation: Secondary | ICD-10-CM | POA: Diagnosis not present

## 2018-10-18 DIAGNOSIS — I2510 Atherosclerotic heart disease of native coronary artery without angina pectoris: Secondary | ICD-10-CM | POA: Diagnosis not present

## 2018-10-18 DIAGNOSIS — Z79899 Other long term (current) drug therapy: Secondary | ICD-10-CM | POA: Diagnosis not present

## 2018-10-18 DIAGNOSIS — I10 Essential (primary) hypertension: Secondary | ICD-10-CM | POA: Diagnosis not present

## 2018-10-18 DIAGNOSIS — E782 Mixed hyperlipidemia: Secondary | ICD-10-CM | POA: Diagnosis not present

## 2019-02-07 DIAGNOSIS — I480 Paroxysmal atrial fibrillation: Secondary | ICD-10-CM | POA: Diagnosis not present

## 2019-02-07 DIAGNOSIS — I10 Essential (primary) hypertension: Secondary | ICD-10-CM | POA: Diagnosis not present

## 2019-02-07 DIAGNOSIS — I25810 Atherosclerosis of coronary artery bypass graft(s) without angina pectoris: Secondary | ICD-10-CM | POA: Diagnosis not present

## 2019-02-07 DIAGNOSIS — E782 Mixed hyperlipidemia: Secondary | ICD-10-CM | POA: Diagnosis not present

## 2019-02-07 DIAGNOSIS — I5022 Chronic systolic (congestive) heart failure: Secondary | ICD-10-CM | POA: Diagnosis not present

## 2019-02-13 DIAGNOSIS — E785 Hyperlipidemia, unspecified: Secondary | ICD-10-CM | POA: Diagnosis not present

## 2019-02-13 DIAGNOSIS — E119 Type 2 diabetes mellitus without complications: Secondary | ICD-10-CM | POA: Diagnosis not present

## 2019-02-13 DIAGNOSIS — Z79899 Other long term (current) drug therapy: Secondary | ICD-10-CM | POA: Diagnosis not present

## 2019-02-14 DIAGNOSIS — I5022 Chronic systolic (congestive) heart failure: Secondary | ICD-10-CM | POA: Diagnosis not present

## 2019-02-20 DIAGNOSIS — E119 Type 2 diabetes mellitus without complications: Secondary | ICD-10-CM | POA: Diagnosis not present

## 2019-02-20 DIAGNOSIS — I4891 Unspecified atrial fibrillation: Secondary | ICD-10-CM | POA: Diagnosis not present

## 2019-02-20 DIAGNOSIS — I2510 Atherosclerotic heart disease of native coronary artery without angina pectoris: Secondary | ICD-10-CM | POA: Diagnosis not present

## 2019-02-20 DIAGNOSIS — I10 Essential (primary) hypertension: Secondary | ICD-10-CM | POA: Diagnosis not present

## 2019-02-21 DIAGNOSIS — I480 Paroxysmal atrial fibrillation: Secondary | ICD-10-CM | POA: Diagnosis not present

## 2019-02-21 DIAGNOSIS — Z951 Presence of aortocoronary bypass graft: Secondary | ICD-10-CM | POA: Diagnosis not present

## 2019-02-21 DIAGNOSIS — E782 Mixed hyperlipidemia: Secondary | ICD-10-CM | POA: Diagnosis not present

## 2019-02-21 DIAGNOSIS — I10 Essential (primary) hypertension: Secondary | ICD-10-CM | POA: Diagnosis not present

## 2019-02-21 DIAGNOSIS — I25810 Atherosclerosis of coronary artery bypass graft(s) without angina pectoris: Secondary | ICD-10-CM | POA: Diagnosis not present

## 2019-02-24 DIAGNOSIS — I480 Paroxysmal atrial fibrillation: Secondary | ICD-10-CM | POA: Diagnosis not present

## 2019-04-10 DIAGNOSIS — Z7901 Long term (current) use of anticoagulants: Secondary | ICD-10-CM | POA: Diagnosis not present

## 2019-04-10 DIAGNOSIS — I4892 Unspecified atrial flutter: Secondary | ICD-10-CM | POA: Diagnosis not present

## 2019-04-10 DIAGNOSIS — I872 Venous insufficiency (chronic) (peripheral): Secondary | ICD-10-CM | POA: Diagnosis not present

## 2019-04-10 DIAGNOSIS — Z951 Presence of aortocoronary bypass graft: Secondary | ICD-10-CM | POA: Diagnosis not present

## 2019-04-10 DIAGNOSIS — I110 Hypertensive heart disease with heart failure: Secondary | ICD-10-CM | POA: Diagnosis not present

## 2019-04-10 DIAGNOSIS — Z1159 Encounter for screening for other viral diseases: Secondary | ICD-10-CM | POA: Diagnosis not present

## 2019-04-10 DIAGNOSIS — Z01812 Encounter for preprocedural laboratory examination: Secondary | ICD-10-CM | POA: Diagnosis not present

## 2019-04-10 DIAGNOSIS — E669 Obesity, unspecified: Secondary | ICD-10-CM | POA: Diagnosis not present

## 2019-04-10 DIAGNOSIS — E1165 Type 2 diabetes mellitus with hyperglycemia: Secondary | ICD-10-CM | POA: Diagnosis not present

## 2019-04-10 DIAGNOSIS — I480 Paroxysmal atrial fibrillation: Secondary | ICD-10-CM | POA: Diagnosis not present

## 2019-04-10 DIAGNOSIS — I5022 Chronic systolic (congestive) heart failure: Secondary | ICD-10-CM | POA: Diagnosis not present

## 2019-04-10 DIAGNOSIS — I25110 Atherosclerotic heart disease of native coronary artery with unstable angina pectoris: Secondary | ICD-10-CM | POA: Diagnosis not present

## 2019-04-10 DIAGNOSIS — I25810 Atherosclerosis of coronary artery bypass graft(s) without angina pectoris: Secondary | ICD-10-CM | POA: Diagnosis not present

## 2019-04-10 DIAGNOSIS — Z6834 Body mass index (BMI) 34.0-34.9, adult: Secondary | ICD-10-CM | POA: Diagnosis not present

## 2019-04-12 DIAGNOSIS — Z951 Presence of aortocoronary bypass graft: Secondary | ICD-10-CM | POA: Diagnosis not present

## 2019-04-12 DIAGNOSIS — I25110 Atherosclerotic heart disease of native coronary artery with unstable angina pectoris: Secondary | ICD-10-CM | POA: Diagnosis not present

## 2019-04-12 DIAGNOSIS — E782 Mixed hyperlipidemia: Secondary | ICD-10-CM | POA: Diagnosis not present

## 2019-04-12 DIAGNOSIS — E119 Type 2 diabetes mellitus without complications: Secondary | ICD-10-CM | POA: Diagnosis not present

## 2019-04-12 DIAGNOSIS — I4892 Unspecified atrial flutter: Secondary | ICD-10-CM | POA: Diagnosis not present

## 2019-04-12 DIAGNOSIS — I255 Ischemic cardiomyopathy: Secondary | ICD-10-CM | POA: Diagnosis not present

## 2019-04-12 DIAGNOSIS — E1165 Type 2 diabetes mellitus with hyperglycemia: Secondary | ICD-10-CM | POA: Diagnosis not present

## 2019-04-12 DIAGNOSIS — M1710 Unilateral primary osteoarthritis, unspecified knee: Secondary | ICD-10-CM | POA: Diagnosis not present

## 2019-04-12 DIAGNOSIS — M1990 Unspecified osteoarthritis, unspecified site: Secondary | ICD-10-CM | POA: Diagnosis not present

## 2019-04-12 DIAGNOSIS — I5022 Chronic systolic (congestive) heart failure: Secondary | ICD-10-CM | POA: Diagnosis not present

## 2019-04-12 DIAGNOSIS — I454 Nonspecific intraventricular block: Secondary | ICD-10-CM | POA: Diagnosis not present

## 2019-04-12 DIAGNOSIS — Z79899 Other long term (current) drug therapy: Secondary | ICD-10-CM | POA: Diagnosis not present

## 2019-04-12 DIAGNOSIS — Z6834 Body mass index (BMI) 34.0-34.9, adult: Secondary | ICD-10-CM | POA: Diagnosis not present

## 2019-04-12 DIAGNOSIS — E875 Hyperkalemia: Secondary | ICD-10-CM | POA: Diagnosis not present

## 2019-04-12 DIAGNOSIS — I252 Old myocardial infarction: Secondary | ICD-10-CM | POA: Diagnosis not present

## 2019-04-12 DIAGNOSIS — E669 Obesity, unspecified: Secondary | ICD-10-CM | POA: Diagnosis not present

## 2019-04-12 DIAGNOSIS — Z7984 Long term (current) use of oral hypoglycemic drugs: Secondary | ICD-10-CM | POA: Diagnosis not present

## 2019-04-12 DIAGNOSIS — I480 Paroxysmal atrial fibrillation: Secondary | ICD-10-CM | POA: Diagnosis not present

## 2019-04-12 DIAGNOSIS — Z79890 Hormone replacement therapy (postmenopausal): Secondary | ICD-10-CM | POA: Diagnosis not present

## 2019-04-12 DIAGNOSIS — I110 Hypertensive heart disease with heart failure: Secondary | ICD-10-CM | POA: Diagnosis not present

## 2019-04-13 DIAGNOSIS — Z9889 Other specified postprocedural states: Secondary | ICD-10-CM | POA: Diagnosis not present

## 2019-04-13 DIAGNOSIS — I255 Ischemic cardiomyopathy: Secondary | ICD-10-CM | POA: Diagnosis not present

## 2019-04-13 DIAGNOSIS — I4892 Unspecified atrial flutter: Secondary | ICD-10-CM | POA: Diagnosis not present

## 2019-04-13 DIAGNOSIS — I429 Cardiomyopathy, unspecified: Secondary | ICD-10-CM | POA: Diagnosis not present

## 2019-04-13 DIAGNOSIS — I110 Hypertensive heart disease with heart failure: Secondary | ICD-10-CM | POA: Diagnosis not present

## 2019-04-13 DIAGNOSIS — Z951 Presence of aortocoronary bypass graft: Secondary | ICD-10-CM | POA: Diagnosis not present

## 2019-04-13 DIAGNOSIS — I5022 Chronic systolic (congestive) heart failure: Secondary | ICD-10-CM | POA: Diagnosis not present

## 2019-04-13 DIAGNOSIS — I480 Paroxysmal atrial fibrillation: Secondary | ICD-10-CM | POA: Diagnosis not present

## 2019-04-13 DIAGNOSIS — Z95810 Presence of automatic (implantable) cardiac defibrillator: Secondary | ICD-10-CM | POA: Diagnosis not present

## 2019-04-13 DIAGNOSIS — I252 Old myocardial infarction: Secondary | ICD-10-CM | POA: Diagnosis not present

## 2019-04-26 DIAGNOSIS — Z95810 Presence of automatic (implantable) cardiac defibrillator: Secondary | ICD-10-CM | POA: Diagnosis not present

## 2019-04-26 DIAGNOSIS — I4891 Unspecified atrial fibrillation: Secondary | ICD-10-CM | POA: Diagnosis not present

## 2019-04-26 DIAGNOSIS — I2510 Atherosclerotic heart disease of native coronary artery without angina pectoris: Secondary | ICD-10-CM | POA: Diagnosis not present

## 2019-04-26 DIAGNOSIS — I509 Heart failure, unspecified: Secondary | ICD-10-CM | POA: Diagnosis not present

## 2019-06-05 DIAGNOSIS — Z4502 Encounter for adjustment and management of automatic implantable cardiac defibrillator: Secondary | ICD-10-CM | POA: Diagnosis not present

## 2019-06-05 DIAGNOSIS — Z95810 Presence of automatic (implantable) cardiac defibrillator: Secondary | ICD-10-CM | POA: Diagnosis not present

## 2019-06-27 DIAGNOSIS — I4891 Unspecified atrial fibrillation: Secondary | ICD-10-CM | POA: Diagnosis not present

## 2019-06-27 DIAGNOSIS — E119 Type 2 diabetes mellitus without complications: Secondary | ICD-10-CM | POA: Diagnosis not present

## 2019-06-27 DIAGNOSIS — Z79899 Other long term (current) drug therapy: Secondary | ICD-10-CM | POA: Diagnosis not present

## 2019-06-27 DIAGNOSIS — I2510 Atherosclerotic heart disease of native coronary artery without angina pectoris: Secondary | ICD-10-CM | POA: Diagnosis not present

## 2019-06-27 DIAGNOSIS — Z125 Encounter for screening for malignant neoplasm of prostate: Secondary | ICD-10-CM | POA: Diagnosis not present

## 2019-06-27 DIAGNOSIS — I509 Heart failure, unspecified: Secondary | ICD-10-CM | POA: Diagnosis not present

## 2019-06-27 DIAGNOSIS — E038 Other specified hypothyroidism: Secondary | ICD-10-CM | POA: Diagnosis not present

## 2019-06-27 DIAGNOSIS — E782 Mixed hyperlipidemia: Secondary | ICD-10-CM | POA: Diagnosis not present

## 2019-06-27 DIAGNOSIS — E118 Type 2 diabetes mellitus with unspecified complications: Secondary | ICD-10-CM | POA: Diagnosis not present

## 2019-07-07 DIAGNOSIS — I5022 Chronic systolic (congestive) heart failure: Secondary | ICD-10-CM | POA: Diagnosis not present

## 2019-07-07 DIAGNOSIS — Z95810 Presence of automatic (implantable) cardiac defibrillator: Secondary | ICD-10-CM | POA: Diagnosis not present

## 2019-08-08 DIAGNOSIS — Z23 Encounter for immunization: Secondary | ICD-10-CM | POA: Diagnosis not present

## 2019-09-12 DIAGNOSIS — I5022 Chronic systolic (congestive) heart failure: Secondary | ICD-10-CM | POA: Diagnosis not present

## 2019-09-14 DIAGNOSIS — Z0000 Encounter for general adult medical examination without abnormal findings: Secondary | ICD-10-CM | POA: Diagnosis not present

## 2019-09-14 DIAGNOSIS — Z139 Encounter for screening, unspecified: Secondary | ICD-10-CM | POA: Diagnosis not present

## 2019-09-14 DIAGNOSIS — E785 Hyperlipidemia, unspecified: Secondary | ICD-10-CM | POA: Diagnosis not present

## 2019-09-14 DIAGNOSIS — Z6834 Body mass index (BMI) 34.0-34.9, adult: Secondary | ICD-10-CM | POA: Diagnosis not present

## 2019-09-14 DIAGNOSIS — Z9181 History of falling: Secondary | ICD-10-CM | POA: Diagnosis not present

## 2019-09-14 DIAGNOSIS — Z1331 Encounter for screening for depression: Secondary | ICD-10-CM | POA: Diagnosis not present

## 2019-09-14 DIAGNOSIS — Z125 Encounter for screening for malignant neoplasm of prostate: Secondary | ICD-10-CM | POA: Diagnosis not present

## 2019-09-28 DIAGNOSIS — E782 Mixed hyperlipidemia: Secondary | ICD-10-CM | POA: Diagnosis not present

## 2019-09-28 DIAGNOSIS — I25810 Atherosclerosis of coronary artery bypass graft(s) without angina pectoris: Secondary | ICD-10-CM | POA: Diagnosis not present

## 2019-09-28 DIAGNOSIS — I5022 Chronic systolic (congestive) heart failure: Secondary | ICD-10-CM | POA: Diagnosis not present

## 2019-09-28 DIAGNOSIS — Z95810 Presence of automatic (implantable) cardiac defibrillator: Secondary | ICD-10-CM | POA: Diagnosis not present

## 2019-09-28 DIAGNOSIS — I10 Essential (primary) hypertension: Secondary | ICD-10-CM | POA: Diagnosis not present

## 2019-09-28 DIAGNOSIS — I480 Paroxysmal atrial fibrillation: Secondary | ICD-10-CM | POA: Diagnosis not present

## 2019-12-28 DIAGNOSIS — M1711 Unilateral primary osteoarthritis, right knee: Secondary | ICD-10-CM | POA: Diagnosis not present

## 2020-01-09 DIAGNOSIS — I5022 Chronic systolic (congestive) heart failure: Secondary | ICD-10-CM | POA: Diagnosis not present

## 2020-01-31 DIAGNOSIS — I10 Essential (primary) hypertension: Secondary | ICD-10-CM | POA: Diagnosis not present

## 2020-01-31 DIAGNOSIS — I25810 Atherosclerosis of coronary artery bypass graft(s) without angina pectoris: Secondary | ICD-10-CM | POA: Diagnosis not present

## 2020-01-31 DIAGNOSIS — I5022 Chronic systolic (congestive) heart failure: Secondary | ICD-10-CM | POA: Diagnosis not present

## 2020-01-31 DIAGNOSIS — I480 Paroxysmal atrial fibrillation: Secondary | ICD-10-CM | POA: Diagnosis not present

## 2020-02-27 DIAGNOSIS — Z79899 Other long term (current) drug therapy: Secondary | ICD-10-CM | POA: Diagnosis not present

## 2020-02-27 DIAGNOSIS — I2510 Atherosclerotic heart disease of native coronary artery without angina pectoris: Secondary | ICD-10-CM | POA: Diagnosis not present

## 2020-02-27 DIAGNOSIS — I509 Heart failure, unspecified: Secondary | ICD-10-CM | POA: Diagnosis not present

## 2020-02-27 DIAGNOSIS — E038 Other specified hypothyroidism: Secondary | ICD-10-CM | POA: Diagnosis not present

## 2020-02-27 DIAGNOSIS — E118 Type 2 diabetes mellitus with unspecified complications: Secondary | ICD-10-CM | POA: Diagnosis not present

## 2020-02-27 DIAGNOSIS — I4891 Unspecified atrial fibrillation: Secondary | ICD-10-CM | POA: Diagnosis not present

## 2020-02-27 DIAGNOSIS — E782 Mixed hyperlipidemia: Secondary | ICD-10-CM | POA: Diagnosis not present

## 2020-03-01 DIAGNOSIS — I25810 Atherosclerosis of coronary artery bypass graft(s) without angina pectoris: Secondary | ICD-10-CM | POA: Diagnosis not present

## 2020-03-01 DIAGNOSIS — I5022 Chronic systolic (congestive) heart failure: Secondary | ICD-10-CM | POA: Diagnosis not present

## 2020-05-13 DIAGNOSIS — E1165 Type 2 diabetes mellitus with hyperglycemia: Secondary | ICD-10-CM | POA: Diagnosis not present

## 2020-05-13 DIAGNOSIS — M1711 Unilateral primary osteoarthritis, right knee: Secondary | ICD-10-CM | POA: Diagnosis not present

## 2020-06-11 DIAGNOSIS — I5022 Chronic systolic (congestive) heart failure: Secondary | ICD-10-CM | POA: Diagnosis not present

## 2020-06-11 DIAGNOSIS — I480 Paroxysmal atrial fibrillation: Secondary | ICD-10-CM | POA: Diagnosis not present

## 2020-06-11 DIAGNOSIS — I2510 Atherosclerotic heart disease of native coronary artery without angina pectoris: Secondary | ICD-10-CM | POA: Diagnosis not present

## 2020-06-11 DIAGNOSIS — I10 Essential (primary) hypertension: Secondary | ICD-10-CM | POA: Diagnosis not present

## 2020-06-11 DIAGNOSIS — I25110 Atherosclerotic heart disease of native coronary artery with unstable angina pectoris: Secondary | ICD-10-CM | POA: Diagnosis not present

## 2020-06-11 DIAGNOSIS — I25810 Atherosclerosis of coronary artery bypass graft(s) without angina pectoris: Secondary | ICD-10-CM | POA: Diagnosis not present

## 2020-06-11 DIAGNOSIS — E782 Mixed hyperlipidemia: Secondary | ICD-10-CM | POA: Diagnosis not present

## 2020-06-26 DIAGNOSIS — I5022 Chronic systolic (congestive) heart failure: Secondary | ICD-10-CM | POA: Diagnosis not present

## 2020-07-02 DIAGNOSIS — Z1211 Encounter for screening for malignant neoplasm of colon: Secondary | ICD-10-CM | POA: Diagnosis not present

## 2020-07-02 DIAGNOSIS — E118 Type 2 diabetes mellitus with unspecified complications: Secondary | ICD-10-CM | POA: Diagnosis not present

## 2020-07-02 DIAGNOSIS — Z6834 Body mass index (BMI) 34.0-34.9, adult: Secondary | ICD-10-CM | POA: Diagnosis not present

## 2020-07-02 DIAGNOSIS — I2510 Atherosclerotic heart disease of native coronary artery without angina pectoris: Secondary | ICD-10-CM | POA: Diagnosis not present

## 2020-07-02 DIAGNOSIS — E038 Other specified hypothyroidism: Secondary | ICD-10-CM | POA: Diagnosis not present

## 2020-07-02 DIAGNOSIS — I509 Heart failure, unspecified: Secondary | ICD-10-CM | POA: Diagnosis not present

## 2020-07-02 DIAGNOSIS — Z95810 Presence of automatic (implantable) cardiac defibrillator: Secondary | ICD-10-CM | POA: Diagnosis not present

## 2020-07-02 DIAGNOSIS — E782 Mixed hyperlipidemia: Secondary | ICD-10-CM | POA: Diagnosis not present

## 2020-07-02 DIAGNOSIS — I10 Essential (primary) hypertension: Secondary | ICD-10-CM | POA: Diagnosis not present

## 2020-07-02 DIAGNOSIS — Z125 Encounter for screening for malignant neoplasm of prostate: Secondary | ICD-10-CM | POA: Diagnosis not present

## 2020-07-02 DIAGNOSIS — Z79899 Other long term (current) drug therapy: Secondary | ICD-10-CM | POA: Diagnosis not present

## 2020-07-02 DIAGNOSIS — I4891 Unspecified atrial fibrillation: Secondary | ICD-10-CM | POA: Diagnosis not present

## 2020-07-06 DIAGNOSIS — Z23 Encounter for immunization: Secondary | ICD-10-CM | POA: Diagnosis not present

## 2020-07-24 DIAGNOSIS — Z1211 Encounter for screening for malignant neoplasm of colon: Secondary | ICD-10-CM | POA: Diagnosis not present

## 2020-07-24 DIAGNOSIS — Z1212 Encounter for screening for malignant neoplasm of rectum: Secondary | ICD-10-CM | POA: Diagnosis not present

## 2020-07-29 DIAGNOSIS — I25810 Atherosclerosis of coronary artery bypass graft(s) without angina pectoris: Secondary | ICD-10-CM | POA: Diagnosis not present

## 2020-07-29 LAB — EXTERNAL GENERIC LAB PROCEDURE: COLOGUARD: NEGATIVE

## 2020-07-29 LAB — COLOGUARD: COLOGUARD: NEGATIVE

## 2020-08-06 DIAGNOSIS — I480 Paroxysmal atrial fibrillation: Secondary | ICD-10-CM | POA: Diagnosis not present

## 2020-08-06 DIAGNOSIS — E782 Mixed hyperlipidemia: Secondary | ICD-10-CM | POA: Diagnosis not present

## 2020-08-06 DIAGNOSIS — I4891 Unspecified atrial fibrillation: Secondary | ICD-10-CM | POA: Diagnosis not present

## 2020-08-06 DIAGNOSIS — Z95810 Presence of automatic (implantable) cardiac defibrillator: Secondary | ICD-10-CM | POA: Diagnosis not present

## 2020-08-06 DIAGNOSIS — Z951 Presence of aortocoronary bypass graft: Secondary | ICD-10-CM | POA: Diagnosis not present

## 2020-08-06 DIAGNOSIS — I5022 Chronic systolic (congestive) heart failure: Secondary | ICD-10-CM | POA: Diagnosis not present

## 2020-08-06 DIAGNOSIS — Z9189 Other specified personal risk factors, not elsewhere classified: Secondary | ICD-10-CM | POA: Diagnosis not present

## 2020-08-06 DIAGNOSIS — Z79899 Other long term (current) drug therapy: Secondary | ICD-10-CM | POA: Diagnosis not present

## 2020-08-06 DIAGNOSIS — I10 Essential (primary) hypertension: Secondary | ICD-10-CM | POA: Diagnosis not present

## 2020-08-06 DIAGNOSIS — I25810 Atherosclerosis of coronary artery bypass graft(s) without angina pectoris: Secondary | ICD-10-CM | POA: Diagnosis not present

## 2020-08-15 DIAGNOSIS — Z23 Encounter for immunization: Secondary | ICD-10-CM | POA: Diagnosis not present

## 2020-09-16 DIAGNOSIS — E785 Hyperlipidemia, unspecified: Secondary | ICD-10-CM | POA: Diagnosis not present

## 2020-09-16 DIAGNOSIS — Z1331 Encounter for screening for depression: Secondary | ICD-10-CM | POA: Diagnosis not present

## 2020-09-16 DIAGNOSIS — Z0000 Encounter for general adult medical examination without abnormal findings: Secondary | ICD-10-CM | POA: Diagnosis not present

## 2020-09-16 DIAGNOSIS — E669 Obesity, unspecified: Secondary | ICD-10-CM | POA: Diagnosis not present

## 2020-09-16 DIAGNOSIS — Z125 Encounter for screening for malignant neoplasm of prostate: Secondary | ICD-10-CM | POA: Diagnosis not present

## 2020-09-16 DIAGNOSIS — Z9181 History of falling: Secondary | ICD-10-CM | POA: Diagnosis not present

## 2020-10-08 DIAGNOSIS — I480 Paroxysmal atrial fibrillation: Secondary | ICD-10-CM | POA: Diagnosis not present

## 2020-11-01 DIAGNOSIS — I4891 Unspecified atrial fibrillation: Secondary | ICD-10-CM | POA: Diagnosis not present

## 2020-11-01 DIAGNOSIS — E038 Other specified hypothyroidism: Secondary | ICD-10-CM | POA: Diagnosis not present

## 2020-11-01 DIAGNOSIS — E782 Mixed hyperlipidemia: Secondary | ICD-10-CM | POA: Diagnosis not present

## 2020-11-01 DIAGNOSIS — Z6834 Body mass index (BMI) 34.0-34.9, adult: Secondary | ICD-10-CM | POA: Diagnosis not present

## 2020-11-01 DIAGNOSIS — I10 Essential (primary) hypertension: Secondary | ICD-10-CM | POA: Diagnosis not present

## 2020-11-01 DIAGNOSIS — Z139 Encounter for screening, unspecified: Secondary | ICD-10-CM | POA: Diagnosis not present

## 2020-11-01 DIAGNOSIS — I509 Heart failure, unspecified: Secondary | ICD-10-CM | POA: Diagnosis not present

## 2020-11-01 DIAGNOSIS — E118 Type 2 diabetes mellitus with unspecified complications: Secondary | ICD-10-CM | POA: Diagnosis not present

## 2020-11-01 DIAGNOSIS — Z95810 Presence of automatic (implantable) cardiac defibrillator: Secondary | ICD-10-CM | POA: Diagnosis not present

## 2020-11-01 DIAGNOSIS — Z23 Encounter for immunization: Secondary | ICD-10-CM | POA: Diagnosis not present

## 2020-11-01 DIAGNOSIS — Z79899 Other long term (current) drug therapy: Secondary | ICD-10-CM | POA: Diagnosis not present

## 2020-11-01 DIAGNOSIS — I2510 Atherosclerotic heart disease of native coronary artery without angina pectoris: Secondary | ICD-10-CM | POA: Diagnosis not present

## 2020-11-27 DIAGNOSIS — I480 Paroxysmal atrial fibrillation: Secondary | ICD-10-CM | POA: Diagnosis not present

## 2020-11-27 DIAGNOSIS — I10 Essential (primary) hypertension: Secondary | ICD-10-CM | POA: Diagnosis not present

## 2020-11-27 DIAGNOSIS — I25810 Atherosclerosis of coronary artery bypass graft(s) without angina pectoris: Secondary | ICD-10-CM | POA: Diagnosis not present

## 2020-11-27 DIAGNOSIS — I5022 Chronic systolic (congestive) heart failure: Secondary | ICD-10-CM | POA: Diagnosis not present

## 2021-01-07 DIAGNOSIS — I5022 Chronic systolic (congestive) heart failure: Secondary | ICD-10-CM | POA: Diagnosis not present

## 2021-03-03 DIAGNOSIS — I4891 Unspecified atrial fibrillation: Secondary | ICD-10-CM | POA: Diagnosis not present

## 2021-03-03 DIAGNOSIS — I2510 Atherosclerotic heart disease of native coronary artery without angina pectoris: Secondary | ICD-10-CM | POA: Diagnosis not present

## 2021-03-03 DIAGNOSIS — E118 Type 2 diabetes mellitus with unspecified complications: Secondary | ICD-10-CM | POA: Diagnosis not present

## 2021-03-03 DIAGNOSIS — Z95810 Presence of automatic (implantable) cardiac defibrillator: Secondary | ICD-10-CM | POA: Diagnosis not present

## 2021-03-03 DIAGNOSIS — Z6835 Body mass index (BMI) 35.0-35.9, adult: Secondary | ICD-10-CM | POA: Diagnosis not present

## 2021-03-03 DIAGNOSIS — Z79899 Other long term (current) drug therapy: Secondary | ICD-10-CM | POA: Diagnosis not present

## 2021-03-03 DIAGNOSIS — E782 Mixed hyperlipidemia: Secondary | ICD-10-CM | POA: Diagnosis not present

## 2021-03-03 DIAGNOSIS — I509 Heart failure, unspecified: Secondary | ICD-10-CM | POA: Diagnosis not present

## 2021-03-03 DIAGNOSIS — I10 Essential (primary) hypertension: Secondary | ICD-10-CM | POA: Diagnosis not present

## 2021-03-03 DIAGNOSIS — E038 Other specified hypothyroidism: Secondary | ICD-10-CM | POA: Diagnosis not present

## 2021-03-20 DIAGNOSIS — H40003 Preglaucoma, unspecified, bilateral: Secondary | ICD-10-CM | POA: Diagnosis not present

## 2021-07-07 DIAGNOSIS — Z6835 Body mass index (BMI) 35.0-35.9, adult: Secondary | ICD-10-CM | POA: Diagnosis not present

## 2021-07-07 DIAGNOSIS — I2510 Atherosclerotic heart disease of native coronary artery without angina pectoris: Secondary | ICD-10-CM | POA: Diagnosis not present

## 2021-07-07 DIAGNOSIS — Z125 Encounter for screening for malignant neoplasm of prostate: Secondary | ICD-10-CM | POA: Diagnosis not present

## 2021-07-07 DIAGNOSIS — Z79899 Other long term (current) drug therapy: Secondary | ICD-10-CM | POA: Diagnosis not present

## 2021-07-07 DIAGNOSIS — Z95810 Presence of automatic (implantable) cardiac defibrillator: Secondary | ICD-10-CM | POA: Diagnosis not present

## 2021-07-07 DIAGNOSIS — I509 Heart failure, unspecified: Secondary | ICD-10-CM | POA: Diagnosis not present

## 2021-07-07 DIAGNOSIS — Z23 Encounter for immunization: Secondary | ICD-10-CM | POA: Diagnosis not present

## 2021-07-07 DIAGNOSIS — I10 Essential (primary) hypertension: Secondary | ICD-10-CM | POA: Diagnosis not present

## 2021-07-07 DIAGNOSIS — E118 Type 2 diabetes mellitus with unspecified complications: Secondary | ICD-10-CM | POA: Diagnosis not present

## 2021-07-07 DIAGNOSIS — I4891 Unspecified atrial fibrillation: Secondary | ICD-10-CM | POA: Diagnosis not present

## 2021-07-07 DIAGNOSIS — E038 Other specified hypothyroidism: Secondary | ICD-10-CM | POA: Diagnosis not present

## 2021-07-07 DIAGNOSIS — E782 Mixed hyperlipidemia: Secondary | ICD-10-CM | POA: Diagnosis not present

## 2021-08-04 DIAGNOSIS — E1165 Type 2 diabetes mellitus with hyperglycemia: Secondary | ICD-10-CM | POA: Diagnosis not present

## 2021-08-04 DIAGNOSIS — M533 Sacrococcygeal disorders, not elsewhere classified: Secondary | ICD-10-CM | POA: Diagnosis not present

## 2021-08-04 DIAGNOSIS — S300XXA Contusion of lower back and pelvis, initial encounter: Secondary | ICD-10-CM | POA: Diagnosis not present

## 2021-08-05 DIAGNOSIS — I480 Paroxysmal atrial fibrillation: Secondary | ICD-10-CM | POA: Diagnosis not present

## 2021-08-12 DIAGNOSIS — Z23 Encounter for immunization: Secondary | ICD-10-CM | POA: Diagnosis not present

## 2021-08-20 DIAGNOSIS — M533 Sacrococcygeal disorders, not elsewhere classified: Secondary | ICD-10-CM | POA: Diagnosis not present

## 2021-08-20 DIAGNOSIS — S300XXA Contusion of lower back and pelvis, initial encounter: Secondary | ICD-10-CM | POA: Diagnosis not present

## 2021-09-10 DIAGNOSIS — I5022 Chronic systolic (congestive) heart failure: Secondary | ICD-10-CM | POA: Diagnosis not present

## 2021-09-10 DIAGNOSIS — I480 Paroxysmal atrial fibrillation: Secondary | ICD-10-CM | POA: Diagnosis not present

## 2021-09-10 DIAGNOSIS — I25810 Atherosclerosis of coronary artery bypass graft(s) without angina pectoris: Secondary | ICD-10-CM | POA: Diagnosis not present

## 2021-09-10 DIAGNOSIS — E782 Mixed hyperlipidemia: Secondary | ICD-10-CM | POA: Diagnosis not present

## 2021-09-10 DIAGNOSIS — I10 Essential (primary) hypertension: Secondary | ICD-10-CM | POA: Diagnosis not present

## 2021-09-22 DIAGNOSIS — Z0000 Encounter for general adult medical examination without abnormal findings: Secondary | ICD-10-CM | POA: Diagnosis not present

## 2021-09-22 DIAGNOSIS — E785 Hyperlipidemia, unspecified: Secondary | ICD-10-CM | POA: Diagnosis not present

## 2021-09-22 DIAGNOSIS — Z1331 Encounter for screening for depression: Secondary | ICD-10-CM | POA: Diagnosis not present

## 2021-09-22 DIAGNOSIS — Z6834 Body mass index (BMI) 34.0-34.9, adult: Secondary | ICD-10-CM | POA: Diagnosis not present

## 2021-09-22 DIAGNOSIS — E669 Obesity, unspecified: Secondary | ICD-10-CM | POA: Diagnosis not present

## 2021-09-22 DIAGNOSIS — Z9181 History of falling: Secondary | ICD-10-CM | POA: Diagnosis not present

## 2021-11-04 DIAGNOSIS — I480 Paroxysmal atrial fibrillation: Secondary | ICD-10-CM | POA: Diagnosis not present

## 2021-11-11 DIAGNOSIS — I509 Heart failure, unspecified: Secondary | ICD-10-CM | POA: Diagnosis not present

## 2021-11-11 DIAGNOSIS — I4891 Unspecified atrial fibrillation: Secondary | ICD-10-CM | POA: Diagnosis not present

## 2021-11-11 DIAGNOSIS — E038 Other specified hypothyroidism: Secondary | ICD-10-CM | POA: Diagnosis not present

## 2021-11-11 DIAGNOSIS — Z79899 Other long term (current) drug therapy: Secondary | ICD-10-CM | POA: Diagnosis not present

## 2021-11-11 DIAGNOSIS — I2510 Atherosclerotic heart disease of native coronary artery without angina pectoris: Secondary | ICD-10-CM | POA: Diagnosis not present

## 2021-11-11 DIAGNOSIS — E782 Mixed hyperlipidemia: Secondary | ICD-10-CM | POA: Diagnosis not present

## 2021-11-11 DIAGNOSIS — E118 Type 2 diabetes mellitus with unspecified complications: Secondary | ICD-10-CM | POA: Diagnosis not present

## 2021-11-11 DIAGNOSIS — I10 Essential (primary) hypertension: Secondary | ICD-10-CM | POA: Diagnosis not present

## 2021-11-11 DIAGNOSIS — Z95810 Presence of automatic (implantable) cardiac defibrillator: Secondary | ICD-10-CM | POA: Diagnosis not present

## 2021-11-11 DIAGNOSIS — Z6834 Body mass index (BMI) 34.0-34.9, adult: Secondary | ICD-10-CM | POA: Diagnosis not present

## 2021-11-11 DIAGNOSIS — Z139 Encounter for screening, unspecified: Secondary | ICD-10-CM | POA: Diagnosis not present

## 2022-02-03 DIAGNOSIS — I5022 Chronic systolic (congestive) heart failure: Secondary | ICD-10-CM | POA: Diagnosis not present

## 2022-02-23 DIAGNOSIS — I4891 Unspecified atrial fibrillation: Secondary | ICD-10-CM | POA: Diagnosis not present

## 2022-02-23 DIAGNOSIS — E038 Other specified hypothyroidism: Secondary | ICD-10-CM | POA: Diagnosis not present

## 2022-02-23 DIAGNOSIS — E782 Mixed hyperlipidemia: Secondary | ICD-10-CM | POA: Diagnosis not present

## 2022-02-23 DIAGNOSIS — Z95810 Presence of automatic (implantable) cardiac defibrillator: Secondary | ICD-10-CM | POA: Diagnosis not present

## 2022-02-23 DIAGNOSIS — I10 Essential (primary) hypertension: Secondary | ICD-10-CM | POA: Diagnosis not present

## 2022-02-23 DIAGNOSIS — M2550 Pain in unspecified joint: Secondary | ICD-10-CM | POA: Diagnosis not present

## 2022-02-23 DIAGNOSIS — I509 Heart failure, unspecified: Secondary | ICD-10-CM | POA: Diagnosis not present

## 2022-02-23 DIAGNOSIS — Z79899 Other long term (current) drug therapy: Secondary | ICD-10-CM | POA: Diagnosis not present

## 2022-02-23 DIAGNOSIS — E118 Type 2 diabetes mellitus with unspecified complications: Secondary | ICD-10-CM | POA: Diagnosis not present

## 2022-02-23 DIAGNOSIS — I2510 Atherosclerotic heart disease of native coronary artery without angina pectoris: Secondary | ICD-10-CM | POA: Diagnosis not present

## 2022-03-18 DIAGNOSIS — I5022 Chronic systolic (congestive) heart failure: Secondary | ICD-10-CM | POA: Diagnosis not present

## 2022-03-18 DIAGNOSIS — I25810 Atherosclerosis of coronary artery bypass graft(s) without angina pectoris: Secondary | ICD-10-CM | POA: Diagnosis not present

## 2022-03-18 DIAGNOSIS — E782 Mixed hyperlipidemia: Secondary | ICD-10-CM | POA: Diagnosis not present

## 2022-03-18 DIAGNOSIS — I4892 Unspecified atrial flutter: Secondary | ICD-10-CM | POA: Diagnosis not present

## 2022-03-18 DIAGNOSIS — Z951 Presence of aortocoronary bypass graft: Secondary | ICD-10-CM | POA: Diagnosis not present

## 2022-03-18 DIAGNOSIS — I480 Paroxysmal atrial fibrillation: Secondary | ICD-10-CM | POA: Diagnosis not present

## 2022-03-18 DIAGNOSIS — Z95810 Presence of automatic (implantable) cardiac defibrillator: Secondary | ICD-10-CM | POA: Diagnosis not present

## 2022-03-18 DIAGNOSIS — E119 Type 2 diabetes mellitus without complications: Secondary | ICD-10-CM | POA: Diagnosis not present

## 2022-03-18 DIAGNOSIS — I10 Essential (primary) hypertension: Secondary | ICD-10-CM | POA: Diagnosis not present

## 2022-05-05 DIAGNOSIS — I5022 Chronic systolic (congestive) heart failure: Secondary | ICD-10-CM | POA: Diagnosis not present

## 2022-07-02 DIAGNOSIS — E118 Type 2 diabetes mellitus with unspecified complications: Secondary | ICD-10-CM | POA: Diagnosis not present

## 2022-07-02 DIAGNOSIS — E782 Mixed hyperlipidemia: Secondary | ICD-10-CM | POA: Diagnosis not present

## 2022-07-02 DIAGNOSIS — I509 Heart failure, unspecified: Secondary | ICD-10-CM | POA: Diagnosis not present

## 2022-07-02 DIAGNOSIS — Z125 Encounter for screening for malignant neoplasm of prostate: Secondary | ICD-10-CM | POA: Diagnosis not present

## 2022-07-02 DIAGNOSIS — I2510 Atherosclerotic heart disease of native coronary artery without angina pectoris: Secondary | ICD-10-CM | POA: Diagnosis not present

## 2022-07-02 DIAGNOSIS — E038 Other specified hypothyroidism: Secondary | ICD-10-CM | POA: Diagnosis not present

## 2022-07-02 DIAGNOSIS — I4891 Unspecified atrial fibrillation: Secondary | ICD-10-CM | POA: Diagnosis not present

## 2022-07-02 DIAGNOSIS — Z95810 Presence of automatic (implantable) cardiac defibrillator: Secondary | ICD-10-CM | POA: Diagnosis not present

## 2022-07-02 DIAGNOSIS — I10 Essential (primary) hypertension: Secondary | ICD-10-CM | POA: Diagnosis not present

## 2022-07-02 DIAGNOSIS — Z79899 Other long term (current) drug therapy: Secondary | ICD-10-CM | POA: Diagnosis not present

## 2022-07-29 DIAGNOSIS — I5022 Chronic systolic (congestive) heart failure: Secondary | ICD-10-CM | POA: Diagnosis not present

## 2022-08-25 DIAGNOSIS — Z23 Encounter for immunization: Secondary | ICD-10-CM | POA: Diagnosis not present

## 2022-09-24 DIAGNOSIS — I480 Paroxysmal atrial fibrillation: Secondary | ICD-10-CM | POA: Diagnosis not present

## 2022-09-24 DIAGNOSIS — R0602 Shortness of breath: Secondary | ICD-10-CM | POA: Diagnosis not present

## 2022-09-24 DIAGNOSIS — Z951 Presence of aortocoronary bypass graft: Secondary | ICD-10-CM | POA: Diagnosis not present

## 2022-09-24 DIAGNOSIS — I10 Essential (primary) hypertension: Secondary | ICD-10-CM | POA: Diagnosis not present

## 2022-09-24 DIAGNOSIS — E782 Mixed hyperlipidemia: Secondary | ICD-10-CM | POA: Diagnosis not present

## 2022-09-24 DIAGNOSIS — I5022 Chronic systolic (congestive) heart failure: Secondary | ICD-10-CM | POA: Diagnosis not present

## 2022-09-24 DIAGNOSIS — I25810 Atherosclerosis of coronary artery bypass graft(s) without angina pectoris: Secondary | ICD-10-CM | POA: Diagnosis not present

## 2022-09-24 DIAGNOSIS — Z79899 Other long term (current) drug therapy: Secondary | ICD-10-CM | POA: Diagnosis not present

## 2022-09-24 DIAGNOSIS — I4892 Unspecified atrial flutter: Secondary | ICD-10-CM | POA: Diagnosis not present

## 2022-10-15 DIAGNOSIS — I5022 Chronic systolic (congestive) heart failure: Secondary | ICD-10-CM | POA: Diagnosis not present

## 2022-10-15 DIAGNOSIS — R0602 Shortness of breath: Secondary | ICD-10-CM | POA: Diagnosis not present

## 2022-10-15 DIAGNOSIS — I25810 Atherosclerosis of coronary artery bypass graft(s) without angina pectoris: Secondary | ICD-10-CM | POA: Diagnosis not present

## 2022-10-22 DIAGNOSIS — E782 Mixed hyperlipidemia: Secondary | ICD-10-CM | POA: Diagnosis not present

## 2022-10-22 DIAGNOSIS — I25810 Atherosclerosis of coronary artery bypass graft(s) without angina pectoris: Secondary | ICD-10-CM | POA: Diagnosis not present

## 2022-10-22 DIAGNOSIS — I5022 Chronic systolic (congestive) heart failure: Secondary | ICD-10-CM | POA: Diagnosis not present

## 2022-10-22 DIAGNOSIS — I480 Paroxysmal atrial fibrillation: Secondary | ICD-10-CM | POA: Diagnosis not present

## 2022-10-22 DIAGNOSIS — I10 Essential (primary) hypertension: Secondary | ICD-10-CM | POA: Diagnosis not present

## 2022-10-22 DIAGNOSIS — Z951 Presence of aortocoronary bypass graft: Secondary | ICD-10-CM | POA: Diagnosis not present

## 2022-11-02 DIAGNOSIS — E782 Mixed hyperlipidemia: Secondary | ICD-10-CM | POA: Diagnosis not present

## 2022-11-02 DIAGNOSIS — I10 Essential (primary) hypertension: Secondary | ICD-10-CM | POA: Diagnosis not present

## 2022-11-02 DIAGNOSIS — I2510 Atherosclerotic heart disease of native coronary artery without angina pectoris: Secondary | ICD-10-CM | POA: Diagnosis not present

## 2022-11-02 DIAGNOSIS — E038 Other specified hypothyroidism: Secondary | ICD-10-CM | POA: Diagnosis not present

## 2022-11-02 DIAGNOSIS — I4891 Unspecified atrial fibrillation: Secondary | ICD-10-CM | POA: Diagnosis not present

## 2022-11-02 DIAGNOSIS — T466X5A Adverse effect of antihyperlipidemic and antiarteriosclerotic drugs, initial encounter: Secondary | ICD-10-CM | POA: Diagnosis not present

## 2022-11-02 DIAGNOSIS — E118 Type 2 diabetes mellitus with unspecified complications: Secondary | ICD-10-CM | POA: Diagnosis not present

## 2022-11-02 DIAGNOSIS — I509 Heart failure, unspecified: Secondary | ICD-10-CM | POA: Diagnosis not present

## 2022-11-02 DIAGNOSIS — G720 Drug-induced myopathy: Secondary | ICD-10-CM | POA: Diagnosis not present

## 2022-11-02 DIAGNOSIS — Z79899 Other long term (current) drug therapy: Secondary | ICD-10-CM | POA: Diagnosis not present

## 2022-11-10 DIAGNOSIS — I480 Paroxysmal atrial fibrillation: Secondary | ICD-10-CM | POA: Diagnosis not present

## 2022-11-19 ENCOUNTER — Telehealth: Payer: Self-pay

## 2022-11-19 NOTE — Patient Outreach (Signed)
  Care Coordination   Initial Visit Note   11/19/2022 Name: Jason [name] MRN: [id number] DOB: Nov 06, 1950  Jason [name] is a 72 y.o. year old male who sees Hamrick, Lorin Mercy, MD for primary care. I spoke with  Jason [name] by phone today.  What matters to the patients health and wellness today?  Patient reports big concern today is joint pain potentially from his Atorvastatin.      Goals Addressed             This Visit's Progress    COMPLETED: Joint pain   ? statin       Care Coordination Interventions: Listened to patients concern for joint pain while on a statin.  He reports cardiology suggested an injection but that cost 1000.00.  Patient reports that he can not afford this Suggested patient speak to cardiology about other options Patient denied any other concerns.         SDOH assessments and interventions completed:  No     Care Coordination Interventions:  Yes, provided   Follow up plan: No further intervention required.   Encounter Outcome:  Pt. Visit Completed   Tomasa Rand, RN, BSN, CEN [place] Coordinator [phone]

## 2022-12-17 DIAGNOSIS — E038 Other specified hypothyroidism: Secondary | ICD-10-CM | POA: Diagnosis not present

## 2023-03-03 DIAGNOSIS — Z9181 History of falling: Secondary | ICD-10-CM | POA: Diagnosis not present

## 2023-03-03 DIAGNOSIS — I2510 Atherosclerotic heart disease of native coronary artery without angina pectoris: Secondary | ICD-10-CM | POA: Diagnosis not present

## 2023-03-03 DIAGNOSIS — Z79899 Other long term (current) drug therapy: Secondary | ICD-10-CM | POA: Diagnosis not present

## 2023-03-03 DIAGNOSIS — E038 Other specified hypothyroidism: Secondary | ICD-10-CM | POA: Diagnosis not present

## 2023-03-03 DIAGNOSIS — I509 Heart failure, unspecified: Secondary | ICD-10-CM | POA: Diagnosis not present

## 2023-03-03 DIAGNOSIS — M159 Polyosteoarthritis, unspecified: Secondary | ICD-10-CM | POA: Diagnosis not present

## 2023-03-03 DIAGNOSIS — Z139 Encounter for screening, unspecified: Secondary | ICD-10-CM | POA: Diagnosis not present

## 2023-03-03 DIAGNOSIS — E118 Type 2 diabetes mellitus with unspecified complications: Secondary | ICD-10-CM | POA: Diagnosis not present

## 2023-03-03 DIAGNOSIS — I4891 Unspecified atrial fibrillation: Secondary | ICD-10-CM | POA: Diagnosis not present

## 2023-03-03 DIAGNOSIS — Z1331 Encounter for screening for depression: Secondary | ICD-10-CM | POA: Diagnosis not present

## 2023-03-03 DIAGNOSIS — E782 Mixed hyperlipidemia: Secondary | ICD-10-CM | POA: Diagnosis not present

## 2023-03-03 DIAGNOSIS — I10 Essential (primary) hypertension: Secondary | ICD-10-CM | POA: Diagnosis not present

## 2023-03-30 DIAGNOSIS — M25511 Pain in right shoulder: Secondary | ICD-10-CM | POA: Diagnosis not present

## 2023-04-06 DIAGNOSIS — M25511 Pain in right shoulder: Secondary | ICD-10-CM | POA: Diagnosis not present

## 2023-04-06 DIAGNOSIS — M25552 Pain in left hip: Secondary | ICD-10-CM | POA: Diagnosis not present

## 2023-04-06 DIAGNOSIS — M1612 Unilateral primary osteoarthritis, left hip: Secondary | ICD-10-CM | POA: Diagnosis not present

## 2023-04-27 DIAGNOSIS — M545 Low back pain, unspecified: Secondary | ICD-10-CM | POA: Diagnosis not present

## 2023-04-27 DIAGNOSIS — Z9889 Other specified postprocedural states: Secondary | ICD-10-CM | POA: Diagnosis not present

## 2023-04-27 DIAGNOSIS — M5416 Radiculopathy, lumbar region: Secondary | ICD-10-CM | POA: Diagnosis not present

## 2023-04-27 DIAGNOSIS — M5136 Other intervertebral disc degeneration, lumbar region: Secondary | ICD-10-CM | POA: Diagnosis not present

## 2023-05-10 ENCOUNTER — Other Ambulatory Visit: Payer: Self-pay | Admitting: Orthopedic Surgery

## 2023-05-10 DIAGNOSIS — M4807 Spinal stenosis, lumbosacral region: Secondary | ICD-10-CM

## 2023-05-11 DIAGNOSIS — I5022 Chronic systolic (congestive) heart failure: Secondary | ICD-10-CM | POA: Diagnosis not present

## 2023-05-12 ENCOUNTER — Ambulatory Visit
Admission: RE | Admit: 2023-05-12 | Discharge: 2023-05-12 | Disposition: A | Discharge status: Home / Self Care | Payer: Medicare Other | Source: Ambulatory Visit | Attending: Orthopedic Surgery | Admitting: Orthopedic Surgery

## 2023-05-12 DIAGNOSIS — M4807 Spinal stenosis, lumbosacral region: Secondary | ICD-10-CM | POA: Diagnosis not present

## 2023-05-12 DIAGNOSIS — M48061 Spinal stenosis, lumbar region without neurogenic claudication: Secondary | ICD-10-CM | POA: Diagnosis not present

## 2023-05-12 DIAGNOSIS — M5136 Other intervertebral disc degeneration, lumbar region: Secondary | ICD-10-CM | POA: Diagnosis not present

## 2023-05-12 DIAGNOSIS — M47816 Spondylosis without myelopathy or radiculopathy, lumbar region: Secondary | ICD-10-CM | POA: Diagnosis not present

## 2023-05-14 ENCOUNTER — Ambulatory Visit: Admission: RE | Admit: 2023-05-14 | Payer: Medicare Other | Source: Ambulatory Visit

## 2023-05-21 DIAGNOSIS — I4892 Unspecified atrial flutter: Secondary | ICD-10-CM | POA: Diagnosis not present

## 2023-05-21 DIAGNOSIS — E119 Type 2 diabetes mellitus without complications: Secondary | ICD-10-CM | POA: Diagnosis not present

## 2023-05-21 DIAGNOSIS — M5416 Radiculopathy, lumbar region: Secondary | ICD-10-CM | POA: Diagnosis not present

## 2023-05-21 DIAGNOSIS — I25810 Atherosclerosis of coronary artery bypass graft(s) without angina pectoris: Secondary | ICD-10-CM | POA: Diagnosis not present

## 2023-05-21 DIAGNOSIS — Z951 Presence of aortocoronary bypass graft: Secondary | ICD-10-CM | POA: Diagnosis not present

## 2023-05-21 DIAGNOSIS — Z95810 Presence of automatic (implantable) cardiac defibrillator: Secondary | ICD-10-CM | POA: Diagnosis not present

## 2023-05-21 DIAGNOSIS — I5022 Chronic systolic (congestive) heart failure: Secondary | ICD-10-CM | POA: Diagnosis not present

## 2023-05-21 DIAGNOSIS — M5136 Other intervertebral disc degeneration, lumbar region: Secondary | ICD-10-CM | POA: Diagnosis not present

## 2023-05-21 DIAGNOSIS — I10 Essential (primary) hypertension: Secondary | ICD-10-CM | POA: Diagnosis not present

## 2023-05-21 DIAGNOSIS — E782 Mixed hyperlipidemia: Secondary | ICD-10-CM | POA: Diagnosis not present

## 2023-05-21 DIAGNOSIS — Z79899 Other long term (current) drug therapy: Secondary | ICD-10-CM | POA: Diagnosis not present

## 2023-05-21 DIAGNOSIS — I480 Paroxysmal atrial fibrillation: Secondary | ICD-10-CM | POA: Diagnosis not present

## 2023-05-21 DIAGNOSIS — R0602 Shortness of breath: Secondary | ICD-10-CM | POA: Diagnosis not present

## 2023-05-21 DIAGNOSIS — M48062 Spinal stenosis, lumbar region with neurogenic claudication: Secondary | ICD-10-CM | POA: Diagnosis not present

## 2023-06-09 DIAGNOSIS — M5416 Radiculopathy, lumbar region: Secondary | ICD-10-CM | POA: Diagnosis not present

## 2023-06-09 DIAGNOSIS — M48062 Spinal stenosis, lumbar region with neurogenic claudication: Secondary | ICD-10-CM | POA: Diagnosis not present

## 2023-06-30 DIAGNOSIS — M5136 Other intervertebral disc degeneration, lumbar region: Secondary | ICD-10-CM | POA: Diagnosis not present

## 2023-06-30 DIAGNOSIS — M48062 Spinal stenosis, lumbar region with neurogenic claudication: Secondary | ICD-10-CM | POA: Diagnosis not present

## 2023-06-30 DIAGNOSIS — M5416 Radiculopathy, lumbar region: Secondary | ICD-10-CM | POA: Diagnosis not present

## 2023-07-01 DIAGNOSIS — M48062 Spinal stenosis, lumbar region with neurogenic claudication: Secondary | ICD-10-CM | POA: Diagnosis not present

## 2023-07-01 DIAGNOSIS — M5416 Radiculopathy, lumbar region: Secondary | ICD-10-CM | POA: Diagnosis not present

## 2023-07-22 DIAGNOSIS — M48062 Spinal stenosis, lumbar region with neurogenic claudication: Secondary | ICD-10-CM | POA: Diagnosis not present

## 2023-07-22 DIAGNOSIS — M5136 Other intervertebral disc degeneration, lumbar region: Secondary | ICD-10-CM | POA: Diagnosis not present

## 2023-07-22 DIAGNOSIS — M5416 Radiculopathy, lumbar region: Secondary | ICD-10-CM | POA: Diagnosis not present

## 2023-08-02 DIAGNOSIS — Z79899 Other long term (current) drug therapy: Secondary | ICD-10-CM | POA: Diagnosis not present

## 2023-08-02 DIAGNOSIS — Z23 Encounter for immunization: Secondary | ICD-10-CM | POA: Diagnosis not present

## 2023-08-02 DIAGNOSIS — E118 Type 2 diabetes mellitus with unspecified complications: Secondary | ICD-10-CM | POA: Diagnosis not present

## 2023-08-02 DIAGNOSIS — Z125 Encounter for screening for malignant neoplasm of prostate: Secondary | ICD-10-CM | POA: Diagnosis not present

## 2023-08-02 DIAGNOSIS — I4891 Unspecified atrial fibrillation: Secondary | ICD-10-CM | POA: Diagnosis not present

## 2023-08-02 DIAGNOSIS — Z1211 Encounter for screening for malignant neoplasm of colon: Secondary | ICD-10-CM | POA: Diagnosis not present

## 2023-08-02 DIAGNOSIS — I2510 Atherosclerotic heart disease of native coronary artery without angina pectoris: Secondary | ICD-10-CM | POA: Diagnosis not present

## 2023-08-02 DIAGNOSIS — I10 Essential (primary) hypertension: Secondary | ICD-10-CM | POA: Diagnosis not present

## 2023-08-02 DIAGNOSIS — E782 Mixed hyperlipidemia: Secondary | ICD-10-CM | POA: Diagnosis not present

## 2023-08-02 DIAGNOSIS — M159 Polyosteoarthritis, unspecified: Secondary | ICD-10-CM | POA: Diagnosis not present

## 2023-08-02 DIAGNOSIS — E038 Other specified hypothyroidism: Secondary | ICD-10-CM | POA: Diagnosis not present

## 2023-08-02 DIAGNOSIS — I509 Heart failure, unspecified: Secondary | ICD-10-CM | POA: Diagnosis not present

## 2023-08-04 DIAGNOSIS — I5022 Chronic systolic (congestive) heart failure: Secondary | ICD-10-CM | POA: Diagnosis not present

## 2023-08-06 ENCOUNTER — Other Ambulatory Visit (HOSPITAL_COMMUNITY): Payer: Self-pay | Admitting: Neurosurgery

## 2023-08-06 DIAGNOSIS — M48062 Spinal stenosis, lumbar region with neurogenic claudication: Secondary | ICD-10-CM

## 2023-08-06 DIAGNOSIS — M5416 Radiculopathy, lumbar region: Secondary | ICD-10-CM | POA: Diagnosis not present

## 2023-09-06 DIAGNOSIS — E876 Hypokalemia: Secondary | ICD-10-CM | POA: Diagnosis not present

## 2023-09-07 ENCOUNTER — Ambulatory Visit (HOSPITAL_COMMUNITY)
Admission: RE | Admit: 2023-09-07 | Discharge: 2023-09-07 | Disposition: A | Discharge status: Home / Self Care | Payer: Medicare Other | Source: Ambulatory Visit | Attending: Neurosurgery | Admitting: Neurosurgery

## 2023-09-07 ENCOUNTER — Other Ambulatory Visit (HOSPITAL_COMMUNITY): Payer: Self-pay | Admitting: Neurosurgery

## 2023-09-07 DIAGNOSIS — I700 Atherosclerosis of aorta: Secondary | ICD-10-CM | POA: Diagnosis not present

## 2023-09-07 DIAGNOSIS — M4186 Other forms of scoliosis, lumbar region: Secondary | ICD-10-CM | POA: Diagnosis not present

## 2023-09-07 DIAGNOSIS — M48062 Spinal stenosis, lumbar region with neurogenic claudication: Secondary | ICD-10-CM

## 2023-09-07 DIAGNOSIS — M51379 Other intervertebral disc degeneration, lumbosacral region without mention of lumbar back pain or lower extremity pain: Secondary | ICD-10-CM | POA: Diagnosis not present

## 2023-09-07 DIAGNOSIS — Z950 Presence of cardiac pacemaker: Secondary | ICD-10-CM | POA: Diagnosis not present

## 2023-09-07 DIAGNOSIS — M47816 Spondylosis without myelopathy or radiculopathy, lumbar region: Secondary | ICD-10-CM | POA: Diagnosis not present

## 2023-09-16 DIAGNOSIS — M48062 Spinal stenosis, lumbar region with neurogenic claudication: Secondary | ICD-10-CM | POA: Diagnosis not present

## 2023-10-05 ENCOUNTER — Other Ambulatory Visit: Payer: Self-pay | Admitting: Neurosurgery

## 2023-10-12 ENCOUNTER — Other Ambulatory Visit: Payer: Self-pay | Admitting: Neurosurgery

## 2023-10-12 NOTE — Progress Notes (Signed)
Surgical orders requested from Tretha Sciara, [place] scheduler for Dr. Lovell Sheehan.

## 2023-10-12 NOTE — Pre-Procedure Instructions (Signed)
Surgical Instructions   Your procedure is scheduled on October 18, 2023. Report to [facility] Main Entrance "A" at 8:00 A.M., then check in with the Admitting office. Any questions or running late day of surgery: call [phone]  Questions prior to your surgery date: call [phone], Monday-Friday, 8am-4pm. If you experience any cold or flu symptoms such as cough, fever, chills, shortness of breath, etc. between now and your scheduled surgery, please notify us at the above number.     Remember:  Do not eat or drink after midnight the night before your surgery    Take these medicines the morning of surgery with A SIP OF WATER: amiodarone (PACERONE)  levothyroxine (SYNTHROID)  metoprolol succinate (TOPROL-XL)    May take these medicines IF NEEDED: acetaminophen (TYLENOL)  gabapentin (NEURONTIN)    STOp taking your apixaban (ELIQUIS) three days prior to surgery. Your last dose will be December 19th.    One week prior to surgery, STOP taking any Aspirin (unless otherwise instructed by your surgeon) Aleve, Naproxen, Ibuprofen, Motrin, Advil, Goody's, BC's, all herbal medications, fish oil, and non-prescription vitamins.   WHAT DO I DO ABOUT MY DIABETES MEDICATION?   Do not take metFORMIN (GLUCOPHAGE) the morning of surgery.  Do not take glimepiride (AMARYL) the evening before surgery or the morning of surgery.     STOP taking your dapagliflozin propanediol (FARXIGA) three days prior to surgery. Your last dose will be December 19th.   HOW TO MANAGE YOUR DIABETES BEFORE AND AFTER SURGERY  Why is it important to control my blood sugar before and after surgery? Improving blood sugar levels before and after surgery helps healing and can limit problems. A way of improving blood sugar control is eating a healthy diet by:  Eating less sugar and carbohydrates  Increasing activity/exercise  Talking with your doctor about reaching your blood sugar goals High blood sugars  (greater than 180 mg/dL) can raise your risk of infections and slow your recovery, so you will need to focus on controlling your diabetes during the weeks before surgery. Make sure that the doctor who takes care of your diabetes knows about your planned surgery including the date and location.  How do I manage my blood sugar before surgery? Check your blood sugar at least 4 times a day, starting 2 days before surgery, to make sure that the level is not too high or low.  Check your blood sugar the morning of your surgery when you wake up and every 2 hours until you get to the Short Stay unit.  If your blood sugar is less than 70 mg/dL, you will need to treat for low blood sugar: Do not take insulin. Treat a low blood sugar (less than 70 mg/dL) with  cup of clear juice (cranberry or apple), 4 glucose tablets, OR glucose gel. Recheck blood sugar in 15 minutes after treatment (to make sure it is greater than 70 mg/dL). If your blood sugar is not greater than 70 mg/dL on recheck, call [phone] for further instructions. Report your blood sugar to the short stay nurse when you get to Short Stay.  If you are admitted to the hospital after surgery: Your blood sugar will be checked by the staff and you will probably be given insulin after surgery (instead of oral diabetes medicines) to make sure you have good blood sugar levels. The goal for blood sugar control after surgery is 80-180 mg/dL.  Do NOT Smoke (Tobacco/Vaping) for 24 hours prior to your procedure.  If you use a CPAP at night, you may bring your mask/headgear for your overnight stay.   You will be asked to remove any contacts, glasses, piercing's, hearing aid's, dentures/partials prior to surgery. Please bring cases for these items if needed.    Patients discharged the day of surgery will not be allowed to drive home, and someone needs to stay with them for 24 hours.  SURGICAL WAITING ROOM VISITATION Patients  may have no more than 2 support people in the waiting area - these visitors may rotate.   Pre-op nurse will coordinate an appropriate time for 1 ADULT support person, who may not rotate, to accompany patient in pre-op.  Children under the age of 58 must have an adult with them who is not the patient and must remain in the main waiting area with an adult.  If the patient needs to stay at the hospital during part of their recovery, the visitor guidelines for inpatient rooms apply.  Please refer to the [facility] website for the visitor guidelines for any additional information.   If you received a COVID test during your pre-op visit  it is requested that you wear a mask when out in public, stay away from anyone that may not be feeling well and notify your surgeon if you develop symptoms. If you have been in contact with anyone that has tested positive in the last 10 days please notify you surgeon.      Pre-operative 5 CHG Bathing Instructions   You can play a key role in reducing the risk of infection after surgery. Your skin needs to be as free of germs as possible. You can reduce the number of germs on your skin by washing with CHG (chlorhexidine gluconate) soap before surgery. CHG is an antiseptic soap that kills germs and continues to kill germs even after washing.   DO NOT use if you have an allergy to chlorhexidine/CHG or antibacterial soaps. If your skin becomes reddened or irritated, stop using the CHG and notify one of our RNs at [phone].   Please shower with the CHG soap starting 4 days before surgery using the following schedule:     Please keep in mind the following:  DO NOT shave, including legs and underarms, starting the day of your first shower.   You may shave your face at any point before/day of surgery.  Place clean sheets on your bed the day you start using CHG soap. Use a clean washcloth (not used since being washed) for each shower. DO NOT sleep with pets once  you start using the CHG.   CHG Shower Instructions:  Wash your face and private area with normal soap. If you choose to wash your hair, wash first with your normal shampoo.  After you use shampoo/soap, rinse your hair and body thoroughly to remove shampoo/soap residue.  Turn the water OFF and apply about 3 tablespoons (45 ml) of CHG soap to a CLEAN washcloth.  Apply CHG soap ONLY FROM YOUR NECK DOWN TO YOUR TOES (washing for 3-5 minutes)  DO NOT use CHG soap on face, private areas, open wounds, or sores.  Pay special attention to the area where your surgery is being performed.  If you are having back surgery, having someone wash your back for you may be helpful. Wait 2 minutes after CHG soap is applied, then you may rinse off the CHG soap.  Pat dry with a  clean towel  Put on clean clothes/pajamas   If you choose to wear lotion, please use ONLY the CHG-compatible lotions on the back of this paper.   Additional instructions for the day of surgery: DO NOT APPLY any lotions, deodorants, cologne, or perfumes.   Do not bring valuables to the hospital. [facility] is not responsible for any belongings/valuables. Do not wear nail polish, gel polish, artificial nails, or any other type of covering on natural nails (fingers and toes) Do not wear jewelry or makeup Put on clean/comfortable clothes.  Please brush your teeth.  Ask your nurse before applying any prescription medications to the skin.     CHG Compatible Lotions   Aveeno Moisturizing lotion  Cetaphil Moisturizing Cream  Cetaphil Moisturizing Lotion  Clairol Herbal Essence Moisturizing Lotion, Dry Skin  Clairol Herbal Essence Moisturizing Lotion, Extra Dry Skin  Clairol Herbal Essence Moisturizing Lotion, Normal Skin  Curel Age Defying Therapeutic Moisturizing Lotion with Alpha Hydroxy  Curel Extreme Care Body Lotion  Curel Soothing Hands Moisturizing Hand Lotion  Curel Therapeutic Moisturizing Cream, Fragrance-Free  Curel  Therapeutic Moisturizing Lotion, Fragrance-Free  Curel Therapeutic Moisturizing Lotion, Original Formula  Eucerin Daily Replenishing Lotion  Eucerin Dry Skin Therapy Plus Alpha Hydroxy Crme  Eucerin Dry Skin Therapy Plus Alpha Hydroxy Lotion  Eucerin Original Crme  Eucerin Original Lotion  Eucerin Plus Crme Eucerin Plus Lotion  Eucerin TriLipid Replenishing Lotion  Keri Anti-Bacterial Hand Lotion  Keri Deep Conditioning Original Lotion Dry Skin Formula Softly Scented  Keri Deep Conditioning Original Lotion, Fragrance Free Sensitive Skin Formula  Keri Lotion Fast Absorbing Fragrance Free Sensitive Skin Formula  Keri Lotion Fast Absorbing Softly Scented Dry Skin Formula  Keri Original Lotion  Keri Skin Renewal Lotion Keri Silky Smooth Lotion  Keri Silky Smooth Sensitive Skin Lotion  Nivea Body Creamy Conditioning Oil  Nivea Body Extra Enriched Lotion  Nivea Body Original Lotion  Nivea Body Sheer Moisturizing Lotion Nivea Crme  Nivea Skin Firming Lotion  NutraDerm 30 Skin Lotion  NutraDerm Skin Lotion  NutraDerm Therapeutic Skin Cream  NutraDerm Therapeutic Skin Lotion  ProShield Protective Hand Cream  Provon moisturizing lotion  Please read over the following fact sheets that you were given.

## 2023-10-13 ENCOUNTER — Other Ambulatory Visit: Payer: Self-pay

## 2023-10-13 ENCOUNTER — Encounter (HOSPITAL_COMMUNITY)
Admission: RE | Admit: 2023-10-13 | Discharge: 2023-10-13 | Disposition: A | Discharge status: Home / Self Care | Payer: Medicare Other | Source: Ambulatory Visit | Attending: Neurosurgery | Admitting: Neurosurgery

## 2023-10-13 ENCOUNTER — Encounter (HOSPITAL_COMMUNITY): Payer: Self-pay

## 2023-10-13 VITALS — BP 112/59 | HR 77 | Temp 97.7°F | Resp 17 | Ht 76.0 in | Wt 250.0 lb

## 2023-10-13 DIAGNOSIS — Z01818 Encounter for other preprocedural examination: Secondary | ICD-10-CM | POA: Diagnosis not present

## 2023-10-13 DIAGNOSIS — E089 Diabetes mellitus due to underlying condition without complications: Secondary | ICD-10-CM | POA: Diagnosis not present

## 2023-10-13 HISTORY — DX: Atherosclerotic heart disease of native coronary artery without angina pectoris: I25.2

## 2023-10-13 HISTORY — DX: Presence of automatic (implantable) cardiac defibrillator: Z95.810

## 2023-10-13 HISTORY — DX: Paroxysmal atrial fibrillation: I48.0

## 2023-10-13 HISTORY — DX: Atherosclerotic heart disease of native coronary artery without angina pectoris: I25.10

## 2023-10-13 HISTORY — DX: Hypothyroidism, unspecified: E03.9

## 2023-10-13 HISTORY — DX: Heart failure, unspecified: I50.9

## 2023-10-13 LAB — CBC
HCT: 43.3 % (ref 39.0–52.0)
Hemoglobin: 14.6 g/dL (ref 13.0–17.0)
MCH: 30.9 pg (ref 26.0–34.0)
MCHC: 33.7 g/dL (ref 30.0–36.0)
MCV: 91.7 fL (ref 80.0–100.0)
Platelets: 119 10*3/uL — ABNORMAL LOW (ref 150–400)
RBC: 4.72 MIL/uL (ref 4.22–5.81)
RDW: 13 % (ref 11.5–15.5)
WBC: 6.8 10*3/uL (ref 4.0–10.5)
nRBC: 0 % (ref 0.0–0.2)

## 2023-10-13 LAB — BASIC METABOLIC PANEL
Anion gap: 7 (ref 5–15)
BUN: 18 mg/dL (ref 8–23)
CO2: 25 mmol/L (ref 22–32)
Calcium: 9.6 mg/dL (ref 8.9–10.3)
Chloride: 104 mmol/L (ref 98–111)
Creatinine, Ser: 1.28 mg/dL — ABNORMAL HIGH (ref 0.61–1.24)
GFR, Estimated: 59 mL/min — ABNORMAL LOW (ref >60)
Glucose, Bld: 83 mg/dL (ref 70–99)
Potassium: 4 mmol/L (ref 3.5–5.1)
Sodium: 136 mmol/L (ref 135–145)

## 2023-10-13 LAB — HEMOGLOBIN A1C
Hgb A1c MFr Bld: 5.8 % — ABNORMAL HIGH (ref 4.8–5.6)
Mean Plasma Glucose: 119.76 mg/dL

## 2023-10-13 LAB — SURGICAL PCR SCREEN
MRSA, PCR: NEGATIVE
Staphylococcus aureus: NEGATIVE

## 2023-10-13 LAB — GLUCOSE, CAPILLARY: Glucose-Capillary: 85 mg/dL (ref 70–99)

## 2023-10-13 NOTE — Progress Notes (Signed)
PCP - Lonie Peak, PA Cardiologist - Dr. Dorothyann Peng - last office visit 05/21/2023  PPM/ICD - Medtronic ICD Device Orders - Orders faxed to office 10/12/23. Awaiting response. Rep Notified - [employer] rep emailed 10/12/2023  Chest x-ray - 09/07/2023 EKG - 10/13/2023 Stress Test - 10/15/2022 (CE) ECHO - 10/15/2022 (CE) Cardiac Cath - 04/13/2016  Sleep Study - Denies CPAP - n/a  Pt is DM2. He checks his blood sugar a few times per week. Normal fasting range is 120-130s. CBG at pre-op appointment 85. A1c result pending  Last dose of GLP1 agonist- n/a GLP1 instructions: n/a  Blood Thinner Instructions: Pt instructed to hold Eliquis for three days. Last dose was 10/13/23 Aspirin Instructions: n/a  NPO after midnight  COVID TEST- n/a   Anesthesia review: Yes. Cardiac clearance (CAD, [name] x4, ICD, CHF) and DM2   Patient denies shortness of breath, fever, cough and chest pain at PAT appointment. Pt denies any respiratory illness/infection in the last two months.   All instructions explained to the patient, with a verbal understanding of the material. Patient agrees to go over the instructions while at home for a better understanding. Patient also instructed to self quarantine after being tested for COVID-19. The opportunity to ask questions was provided.

## 2023-10-14 ENCOUNTER — Encounter (HOSPITAL_COMMUNITY): Payer: Self-pay

## 2023-10-14 NOTE — Anesthesia Preprocedure Evaluation (Addendum)
Anesthesia Evaluation  Patient identified by MRN, date of birth, ID band Patient awake    Reviewed: Allergy & Precautions, H&P , NPO status , Patient's Chart, lab work & pertinent test results  Airway Mallampati: II  TM Distance: >3 FB Neck ROM: Full    Dental no notable dental hx.    Pulmonary neg pulmonary ROS   Pulmonary exam normal breath sounds clear to auscultation       Cardiovascular hypertension, Pt. on medications and Pt. on home beta blockers + angina  + CAD, + Past MI, + [name] and +CHF  Normal cardiovascular exam+ Cardiac Defibrillator  Rhythm:Regular Rate:Normal     Neuro/Psych negative neurological ROS  negative psych ROS   GI/Hepatic negative GI ROS, Neg liver ROS,,,  Endo/Other  diabetes, Well Controlled, Type 2Hypothyroidism    Renal/GU negative Renal ROS  negative genitourinary   Musculoskeletal negative musculoskeletal ROS (+) Arthritis ,    Abdominal   Peds negative pediatric ROS (+)  Hematology negative hematology ROS (+)   Anesthesia Other Findings   Reproductive/Obstetrics negative OB ROS                              Anesthesia Physical Anesthesia Plan  ASA: 4  Anesthesia Plan: General   Post-op Pain Management: Minimal or no pain anticipated, Dilaudid IV, Ketamine IV* and Ofirmev IV (intra-op)*   Induction: Intravenous  PONV Risk Score and Plan: 2 and Ondansetron, Dexamethasone and Treatment may vary due to age or medical condition  Airway Management Planned: Oral ETT  Additional Equipment: ClearSight  Intra-op Plan:   Post-operative Plan: Extubation in OR  Informed Consent:      Dental advisory given  Plan Discussed with: Anesthesiologist and CRNA  Anesthesia Plan Comments: (PAT note written 10/15/2023 by Shonna Chock, PA-C. He has a Medtronic ICD.  DISCUSSION: Patient is a 72 year old male scheduled for the above procedure.   History  includes never smoker, HTN, DM2, CAD ([name]: LIMA-LAD, SVG-PDA, SVG-OM, SVG-D1 04/14/16), ischemic cardiomyopathy, chronic systolic CHF, AICD (Medtronic Evera MRI XT DR [id number] dual chamber ICD 04/12/19), PAF (s/p DCCV 08/02/18), edema, hypothyroidism, spinal surgery.    Last cardiology visit was on 05/21/23 with Dr. Juliann Pares. Continue current therapy for CAD, HLD, PAF, ischemic CM. Last EF 40% in December 2023 (up from 35% 02/2020). 09/2022 nuclear stress test showed mild to moderate inferior ischemia, but he was completely asymptomatic, so continued medical therapy was recommended. No ICD discharges. Follow-up in six months planned. Dr. Lovell Sheehan reached out to Dr. Juliann Pares regarding preoperative input. He completed perioperative cardiac device form. Last ICD check was 08/10/23 with normal device function. Patient was not pacer dependent. He checked box indicating that procedure should not interfere with device function; However, given patient will be in prone position would anticipate that [employer] rep will need to preprogram device before and after surgery. Patient reported instructions to hold for 3 days priori to surgery, last dose 10/13/23.    A1c 5.8%. He is on metformin, glimepiride, and [place]. Last Farxiga planned for 10/14/23.    Anesthesia team to evaluate on the day of surgery.    VS: BP (!) 112/59   Pulse 77   Temp 36.5 C   Resp 17   Ht 6\' 4"  (1.93 m)   Wt 113.4 kg   SpO2 97%   BMI 30.43 kg/m   EKG: 10/13/23: Sinus rhythm with 1st degree A-V block Left axis deviation Non-specific intra-ventricular  conduction block Inferior infarct , age undetermined Abnormal ECG When compared with ECG of 02-Aug-2018 08:02, I suspect there is a lead refersal issue no significant changes from previous ecg     CV: Echo 10/15/22 ([facility]): INTERPRETATION  MILD LV SYSTOLIC DYSFUNCTION (mild global hypokinesis; anterior, lateral, septal, apical, inferior, posterior hypokinesis)   WITH MILD LVH   Grade 1 diastolic dysfunction Moderately enlarged left atrium NORMAL RIGHT VENTRICULAR SYSTOLIC FUNCTION  MILD VALVULAR REGURGITATION (Trivial AR, Mild MR, Trivial TR, Trivial PR)  NO VALVULAR STENOSIS  ESTIMATED LVEF: 40%  CALCULATED: 42.6%      Nuclear Stress Test 10/15/22 ([facility]): 1.  Normal left ventricular function  2.  No regional wall motion abnormalities  3.  Mild to moderate inferior ischemia  - Per cardiology follow-up on 10/22/22, "Coronary artery disease: With mild to moderate inferior ischemia on stress test although completely asymptomatic with no complaints of angina or anginal equivalent at this time. Will continue to pursue medical management at this time."     US Carotid 04/14/16: Summary:  - The vertebral arteries appear patent with antegrade flow.  - Findings consistent with 1-39 percent stenosis involving the    right internal carotid artery and the left internal carotid    artery.        )        Anesthesia Quick Evaluation

## 2023-10-14 NOTE — Progress Notes (Addendum)
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  History includes never smoker, HTN, DM2, CAD ([name]: LIMA-LAD, SVG-PDA, SVG-OM, SVG-D1 04/14/16), ischemic cardiomyopathy, chronic systolic CHF, AICD (Medtronic Evera MRI XT DR [id number] dual chamber ICD 04/12/19), PAF (s/p DCCV 08/02/18), edema, hypothyroidism, spinal surgery.   Last cardiology visit was on 05/21/23 with Dr. Juliann Pares. Continue current therapy for CAD, HLD, PAF, ischemic CM. Last EF 40% in December 2023 (up from 35% 02/2020). 09/2022 nuclear stress test showed mild to moderate inferior ischemia, but he was completely asymptomatic, so continued medical therapy was recommended. No ICD discharges. Follow-up in six months planned. Dr. Lovell Sheehan reached out to Dr. Juliann Pares regarding preoperative input. He completed perioperative cardiac device form. Last ICD check was 08/10/23 with normal device function. Patient was not pacer dependent. He checked box indicating that procedure should not interfere with device function; However, given patient will be in prone position would anticipate that [employer] rep will need to preprogram device before and after surgery. Patient reported instructions to hold for 3 days priori to surgery, last dose 10/13/23.   A1c 5.8%. He is on metformin, glimepiride, and [place]. Last Farxiga planned for 10/14/23.   Anesthesia team to evaluate on the day of surgery.   VS: BP (!) 112/59   Pulse 77   Temp 36.5 C   Resp 17   Ht 6\' 4"  (1.93 m)   Wt 113.4 kg   SpO2 97%   BMI 30.43 kg/m    PROVIDERS: Lonie Peak, PA is PCP  Dorothyann Peng, MD is cardiologist Gavin Potters, Rolla Flatten) Gerre Pebbles, MD is EP  cardiologist with [facility], but he is primarily followed with [facility] in [employer]   LABS: Labs reviewed: Acceptable for surgery. (all labs ordered are listed, but only abnormal results are displayed)  Labs Reviewed  BASIC METABOLIC PANEL - Abnormal; Notable for the following components:      Result Value   Creatinine, Ser 1.28 (*)    GFR, Estimated 59 (*)    All other components within normal limits  CBC - Abnormal; Notable for the following components:   Platelets 119 (*)    All other components within normal limits  HEMOGLOBIN A1C - Abnormal; Notable for the following components:   Hgb A1c MFr Bld 5.8 (*)    All other components within normal limits  SURGICAL PCR SCREEN  GLUCOSE, CAPILLARY    IMAGES: MRI L-spine 09/07/23: IMPRESSION: 1. Transitional lumbosacral anatomy. In keeping with the numbering applied to the lumbar spine CT, there is a transitional, partially lumbarized S1 segment. 2. 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 3. Moderate multifactorial spinal stenosis at L3-4 with mild lateral recess narrowing bilaterally. In addition, there is a small left foraminal disc extrusion causing moderate left foraminal narrowing and possible left L3 nerve root encroachment. 4. Mild to moderate multifactorial spinal stenosis at L4-5 with asymmetric narrowing of the right lateral recess. In addition, there is mild-to-moderate foraminal narrowing, worse on the left. 5. 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  1V CXR 09/07/23: IMPRESSION: 1. Left subclavian AICD leads as described. No pneumothorax. 2. No  acute cardiopulmonary process.   EKG: 10/13/23: Sinus rhythm with 1st degree A-V block Left axis deviation Non-specific intra-ventricular conduction block Inferior infarct , age undetermined Abnormal ECG When compared with ECG of 02-Aug-2018 08:02, I suspect  there is a lead refersal issue no significant changes from previous ecg   CV: Echo 10/15/22 ([facility]): INTERPRETATION  MILD LV SYSTOLIC DYSFUNCTION (mild global hypokinesis; anterior, lateral, septal, apical, inferior, posterior hypokinesis)   WITH MILD LVH  Grade 1 diastolic dysfunction Moderately enlarged left atrium NORMAL RIGHT VENTRICULAR SYSTOLIC FUNCTION  MILD VALVULAR REGURGITATION (Trivial AR, Mild MR, Trivial TR, Trivial PR)  NO VALVULAR STENOSIS  ESTIMATED LVEF: 40%  CALCULATED: 42.6%    Nuclear Stress Test 10/15/22 ([facility]): 1.  Normal left ventricular function  2.  No regional wall motion abnormalities  3.  Mild to moderate inferior ischemia  - Per cardiology follow-up on 10/22/22, "Coronary artery disease: With mild to moderate inferior ischemia on stress test although completely asymptomatic with no complaints of angina or anginal equivalent at this time. Will continue to pursue medical management at this time."   US Carotid 04/14/16: Summary:  - The vertebral arteries appear patent with antegrade flow.  - Findings consistent with 1-39 percent stenosis involving the    right internal carotid artery and the left internal carotid    artery.    Last cardiac cath seen is fro 04/13/16, pre-[name]. EF 45%.  Past Medical History:  Diagnosis Date   AICD (automatic cardioverter/defibrillator) present    Medtronic   Arthritis    CHF (congestive heart failure) (HCC)    Coronary artery disease involving native coronary artery with unstable angina pectoris (HCC) 04/13/2016   Coronary artery disease with history of myocardial infarction without history of [name]    Dr. 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 [place], [place]. Dr. Elesa Hacker x2   CARDIAC CATHETERIZATION Left 04/13/2016   Procedure: Left Heart Cath and Coronary Angiography;  Surgeon: [name], MD;  Location: [facility] INVASIVE CV LAB;  Service: Cardiovascular;  Laterality: Left;   CARDIOVERSION N/A 08/02/2018   Procedure: CARDIOVERSION;  Surgeon: [name], MD;  Location: [facility] ORS;  Service: Cardiovascular;  Laterality: N/A;   CATARACT EXTRACTION W/PHACO Right 01/23/2016   Procedure: CATARACT EXTRACTION PHACO AND INTRAOCULAR LENS PLACEMENT (IOC);  Surgeon: Galen Manila, MD;  Location: [facility] ORS;  Service: Ophthalmology;  Laterality: Right;  [place] 01:18 AP% 23.8 CDE 18.67 fluid pack lot # [id number]   CHOLECYSTECTOMY     CORONARY ARTERY BYPASS GRAFT N/A 04/14/2016   Procedure: CORONARY ARTERY BYPASS GRAFTING TIMES FOUR    USING LEFT INTERNAL MAMMARY AND ENDOSCOPIC HARVEST RIGHT SAPHENOUS VEIN;  Surgeon: Purcell Nails, MD;  Location: [facility] OR;  Service: Open Heart Surgery;  Laterality: N/A;   EYE SURGERY Bilateral    Cataract Extraction with IOL implant.   INTRAOPERATIVE TRANSESOPHAGEAL ECHOCARDIOGRAM  N/A 04/14/2016   Procedure: INTRAOPERATIVE TRANSESOPHAGEAL ECHOCARDIOGRAM;  Surgeon: Purcell Nails, MD;  Location: [facility] OR;  Service: Open Heart Surgery;  Laterality: N/A;   KNEE ARTHROSCOPY Left 10/08/2015   Procedure: ARTHROSCOPY KNEE, PARTIAL MEDIAL AND LATERAL MENISECTOMY, REMOVAL OF FOREIGN BODY;  Surgeon: Kennedy Bucker, MD;  Location: [facility] ORS;  Service: Orthopedics;  Laterality: Left;    MEDICATIONS:  acetaminophen (TYLENOL) 500 MG tablet   amiodarone (PACERONE) 200 MG tablet   apixaban (ELIQUIS) 5 MG TABS tablet   dapagliflozin propanediol (FARXIGA) 10 MG TABS tablet   gabapentin (NEURONTIN) 100 MG capsule    glimepiride (AMARYL) 4 MG tablet   levothyroxine (SYNTHROID) 50 MCG tablet   metFORMIN (GLUCOPHAGE) 1000 MG tablet   metoprolol succinate (TOPROL-XL) 100 MG 24 hr tablet   rosuvastatin (CRESTOR) 40 MG tablet   sacubitril-valsartan (ENTRESTO) 49-51 MG   torsemide (DEMADEX) 20 MG tablet   No current facility-administered medications for this encounter.    Shonna Chock, PA-C Surgical Short Stay/Anesthesiology [facility] Phone [phone] [facility] Phone [phone] 10/15/2023 3:23 PM

## 2023-10-18 ENCOUNTER — Ambulatory Visit (HOSPITAL_COMMUNITY): Payer: Medicare Other | Admitting: Vascular Surgery

## 2023-10-18 ENCOUNTER — Encounter (HOSPITAL_COMMUNITY)
Admission: RE | Disposition: A | Discharge status: Home / Self Care | Payer: Self-pay | Source: Home / Self Care | Attending: Neurosurgery

## 2023-10-18 ENCOUNTER — Other Ambulatory Visit: Payer: Self-pay

## 2023-10-18 ENCOUNTER — Other Ambulatory Visit (HOSPITAL_COMMUNITY): Payer: Self-pay | Admitting: Neurosurgery

## 2023-10-18 ENCOUNTER — Ambulatory Visit (HOSPITAL_BASED_OUTPATIENT_CLINIC_OR_DEPARTMENT_OTHER): Payer: Medicare Other | Admitting: Anesthesiology

## 2023-10-18 ENCOUNTER — Ambulatory Visit (HOSPITAL_COMMUNITY): Payer: Medicare Other

## 2023-10-18 ENCOUNTER — Ambulatory Visit (HOSPITAL_COMMUNITY)
Admission: RE | Admit: 2023-10-18 | Discharge: 2023-10-19 | Disposition: A | Discharge status: Home / Self Care | Payer: Medicare Other | Attending: Neurosurgery | Admitting: Neurosurgery

## 2023-10-18 ENCOUNTER — Encounter (HOSPITAL_COMMUNITY): Payer: Self-pay | Admitting: Neurosurgery

## 2023-10-18 DIAGNOSIS — I25110 Atherosclerotic heart disease of native coronary artery with unstable angina pectoris: Secondary | ICD-10-CM

## 2023-10-18 DIAGNOSIS — I252 Old myocardial infarction: Secondary | ICD-10-CM | POA: Insufficient documentation

## 2023-10-18 DIAGNOSIS — F17220 Nicotine dependence, chewing tobacco, uncomplicated: Secondary | ICD-10-CM | POA: Insufficient documentation

## 2023-10-18 DIAGNOSIS — M48062 Spinal stenosis, lumbar region with neurogenic claudication: Secondary | ICD-10-CM

## 2023-10-18 DIAGNOSIS — Z951 Presence of aortocoronary bypass graft: Secondary | ICD-10-CM | POA: Insufficient documentation

## 2023-10-18 DIAGNOSIS — I110 Hypertensive heart disease with heart failure: Secondary | ICD-10-CM

## 2023-10-18 DIAGNOSIS — Z981 Arthrodesis status: Secondary | ICD-10-CM | POA: Diagnosis not present

## 2023-10-18 DIAGNOSIS — Z7984 Long term (current) use of oral hypoglycemic drugs: Secondary | ICD-10-CM | POA: Diagnosis not present

## 2023-10-18 DIAGNOSIS — Z01818 Encounter for other preprocedural examination: Secondary | ICD-10-CM

## 2023-10-18 DIAGNOSIS — I5022 Chronic systolic (congestive) heart failure: Secondary | ICD-10-CM | POA: Diagnosis not present

## 2023-10-18 DIAGNOSIS — M4802 Spinal stenosis, cervical region: Secondary | ICD-10-CM | POA: Diagnosis not present

## 2023-10-18 DIAGNOSIS — E119 Type 2 diabetes mellitus without complications: Secondary | ICD-10-CM | POA: Diagnosis not present

## 2023-10-18 DIAGNOSIS — I509 Heart failure, unspecified: Secondary | ICD-10-CM | POA: Diagnosis not present

## 2023-10-18 DIAGNOSIS — I2510 Atherosclerotic heart disease of native coronary artery without angina pectoris: Secondary | ICD-10-CM | POA: Insufficient documentation

## 2023-10-18 DIAGNOSIS — M5416 Radiculopathy, lumbar region: Secondary | ICD-10-CM | POA: Diagnosis not present

## 2023-10-18 DIAGNOSIS — R52 Pain, unspecified: Secondary | ICD-10-CM

## 2023-10-18 HISTORY — PX: LUMBAR LAMINECTOMY/DECOMPRESSION MICRODISCECTOMY: SHX5026

## 2023-10-18 LAB — GLUCOSE, CAPILLARY
Glucose-Capillary: 146 mg/dL — ABNORMAL HIGH (ref 70–99)
Glucose-Capillary: 131 mg/dL — ABNORMAL HIGH (ref 70–99)
Glucose-Capillary: 160 mg/dL — ABNORMAL HIGH (ref 70–99)
Glucose-Capillary: 249 mg/dL — ABNORMAL HIGH (ref 70–99)
Glucose-Capillary: 253 mg/dL — ABNORMAL HIGH (ref 70–99)

## 2023-10-18 SURGERY — LUMBAR LAMINECTOMY/DECOMPRESSION MICRODISCECTOMY 2 LEVELS
Anesthesia: General | Laterality: Bilateral

## 2023-10-18 MED ORDER — LEVOTHYROXINE SODIUM 25 MCG PO TABS
50.0000 ug | ORAL_TABLET | Freq: Every day | ORAL | Status: DC
  Administered 2023-10-19: 50 ug via ORAL
  Filled 2023-10-18: qty 2

## 2023-10-18 MED ORDER — CHLORHEXIDINE GLUCONATE CLOTH 2 % EX PADS: 6.0000 | MEDICATED_PAD | Freq: Once | CUTANEOUS | Status: DC

## 2023-10-18 MED ORDER — OXYCODONE HCL 5 MG PO TABS
5.0000 mg | ORAL_TABLET | Freq: Once | ORAL | Status: DC | PRN
Start: 2023-10-18 — End: 2023-10-18

## 2023-10-18 MED ORDER — GLIMEPIRIDE 2 MG PO TABS
4.0000 mg | ORAL_TABLET | Freq: Two times a day (BID) | ORAL | Status: DC
  Administered 2023-10-18 – 2023-10-19 (×2): 4 mg via ORAL
  Filled 2023-10-18 (×2): qty 2

## 2023-10-18 MED ORDER — ORAL CARE MOUTH RINSE: 15.0000 mL | Freq: Once | OROMUCOSAL | Status: AC

## 2023-10-18 MED ORDER — SODIUM CHLORIDE 0.9% FLUSH: 3.0000 mL | INTRAVENOUS | Status: DC | PRN

## 2023-10-18 MED ORDER — ACETAMINOPHEN 500 MG PO TABS
1000.0000 mg | ORAL_TABLET | Freq: Four times a day (QID) | ORAL | Status: AC
  Administered 2023-10-18 – 2023-10-19 (×3): 1000 mg via ORAL
  Filled 2023-10-18 (×3): qty 2

## 2023-10-18 MED ORDER — BUPIVACAINE-EPINEPHRINE (PF) 0.5% -1:200000 IJ SOLN
INTRAMUSCULAR | Status: AC
  Filled 2023-10-18: qty 30

## 2023-10-18 MED ORDER — LACTATED RINGERS IV SOLN: INTRAVENOUS | Status: DC | PRN

## 2023-10-18 MED ORDER — ONDANSETRON HCL 4 MG/2ML IJ SOLN: 4.0000 mg | Freq: Once | INTRAMUSCULAR | Status: DC | PRN

## 2023-10-18 MED ORDER — BUPIVACAINE-EPINEPHRINE (PF) 0.5% -1:200000 IJ SOLN
INTRAMUSCULAR | Status: DC | PRN
  Administered 2023-10-18: 10 mL
  Administered 2023-10-18: 10 mL via PERINEURAL

## 2023-10-18 MED ORDER — TORSEMIDE 20 MG PO TABS
20.0000 mg | ORAL_TABLET | Freq: Every morning | ORAL | Status: DC
Start: 2023-10-19 — End: 2023-10-19
  Administered 2023-10-19: 20 mg via ORAL
  Filled 2023-10-18: qty 1

## 2023-10-18 MED ORDER — ACETAMINOPHEN 325 MG PO TABS: 650.0000 mg | ORAL_TABLET | ORAL | Status: DC | PRN

## 2023-10-18 MED ORDER — ACETAMINOPHEN 160 MG/5ML PO SOLN: 325.0000 mg | ORAL | Status: DC | PRN

## 2023-10-18 MED ORDER — MIDAZOLAM HCL 2 MG/2ML IJ SOLN
INTRAMUSCULAR | Status: DC | PRN
  Administered 2023-10-18: 2 mg via INTRAVENOUS

## 2023-10-18 MED ORDER — GABAPENTIN 100 MG PO CAPS
100.0000 mg | ORAL_CAPSULE | Freq: Every evening | ORAL | Status: DC | PRN
Start: 2023-10-18 — End: 2023-10-19

## 2023-10-18 MED ORDER — INSULIN ASPART 100 UNIT/ML IJ SOLN: 0.0000 [IU] | Freq: Three times a day (TID) | INTRAMUSCULAR | Status: DC

## 2023-10-18 MED ORDER — OXYCODONE HCL 5 MG/5ML PO SOLN: 5.0000 mg | Freq: Once | ORAL | Status: DC | PRN

## 2023-10-18 MED ORDER — DEXAMETHASONE SODIUM PHOSPHATE 10 MG/ML IJ SOLN
INTRAMUSCULAR | Status: AC
  Filled 2023-10-18: qty 1

## 2023-10-18 MED ORDER — CHLORHEXIDINE GLUCONATE 0.12 % MT SOLN
15.0000 mL | Freq: Once | OROMUCOSAL | Status: AC
  Administered 2023-10-18: 15 mL via OROMUCOSAL
  Filled 2023-10-18: qty 15

## 2023-10-18 MED ORDER — PHENYLEPHRINE 80 MCG/ML (10ML) SYRINGE FOR IV PUSH (FOR BLOOD PRESSURE SUPPORT)
PREFILLED_SYRINGE | INTRAVENOUS | Status: DC | PRN
  Administered 2023-10-18 (×2): 80 ug via INTRAVENOUS
  Administered 2023-10-18: 160 ug via INTRAVENOUS
  Administered 2023-10-18 (×2): 80 ug via INTRAVENOUS

## 2023-10-18 MED ORDER — CYCLOBENZAPRINE HCL 10 MG PO TABS
10.0000 mg | ORAL_TABLET | Freq: Three times a day (TID) | ORAL | Status: DC | PRN
  Administered 2023-10-18: 10 mg via ORAL
  Filled 2023-10-18: qty 1

## 2023-10-18 MED ORDER — PHENYLEPHRINE 80 MCG/ML (10ML) SYRINGE FOR IV PUSH (FOR BLOOD PRESSURE SUPPORT)
PREFILLED_SYRINGE | INTRAVENOUS | Status: AC
  Filled 2023-10-18: qty 10

## 2023-10-18 MED ORDER — ROCURONIUM BROMIDE 10 MG/ML (PF) SYRINGE
PREFILLED_SYRINGE | INTRAVENOUS | Status: DC | PRN
  Administered 2023-10-18: 20 mg via INTRAVENOUS
  Administered 2023-10-18: 100 mg via INTRAVENOUS

## 2023-10-18 MED ORDER — METFORMIN HCL 500 MG PO TABS
1000.0000 mg | ORAL_TABLET | Freq: Every day | ORAL | Status: DC
  Administered 2023-10-19: 1000 mg via ORAL
  Filled 2023-10-18: qty 2

## 2023-10-18 MED ORDER — MIDAZOLAM HCL 2 MG/2ML IJ SOLN
INTRAMUSCULAR | Status: AC
  Filled 2023-10-18: qty 2

## 2023-10-18 MED ORDER — ONDANSETRON HCL 4 MG PO TABS
4.0000 mg | ORAL_TABLET | Freq: Four times a day (QID) | ORAL | Status: DC | PRN
Start: 2023-10-18 — End: 2023-10-19

## 2023-10-18 MED ORDER — OXYCODONE HCL 5 MG PO TABS
10.0000 mg | ORAL_TABLET | ORAL | Status: DC | PRN
  Administered 2023-10-18 – 2023-10-19 (×4): 10 mg via ORAL
  Filled 2023-10-18 (×4): qty 2

## 2023-10-18 MED ORDER — MEPERIDINE HCL 25 MG/ML IJ SOLN
6.2500 mg | INTRAMUSCULAR | Status: DC | PRN
Start: 2023-10-18 — End: 2023-10-18

## 2023-10-18 MED ORDER — EPHEDRINE 5 MG/ML INJ
INTRAVENOUS | Status: AC
  Filled 2023-10-18: qty 5

## 2023-10-18 MED ORDER — SUGAMMADEX SODIUM 200 MG/2ML IV SOLN
INTRAVENOUS | Status: DC | PRN
  Administered 2023-10-18: 200 mg via INTRAVENOUS

## 2023-10-18 MED ORDER — METOPROLOL SUCCINATE ER 100 MG PO TB24
100.0000 mg | ORAL_TABLET | Freq: Every morning | ORAL | Status: DC
Start: 2023-10-19 — End: 2023-10-19
  Administered 2023-10-19: 100 mg via ORAL
  Filled 2023-10-18: qty 1

## 2023-10-18 MED ORDER — DAPAGLIFLOZIN PROPANEDIOL 10 MG PO TABS
10.0000 mg | ORAL_TABLET | Freq: Every morning | ORAL | Status: DC
Start: 2023-10-19 — End: 2023-10-19
  Administered 2023-10-19: 10 mg via ORAL
  Filled 2023-10-18: qty 1

## 2023-10-18 MED ORDER — THROMBIN 5000 UNITS EX SOLR
CUTANEOUS | Status: AC
  Filled 2023-10-18: qty 5000

## 2023-10-18 MED ORDER — BACITRACIN ZINC 500 UNIT/GM EX OINT
TOPICAL_OINTMENT | CUTANEOUS | Status: AC
  Filled 2023-10-18: qty 28.35

## 2023-10-18 MED ORDER — SODIUM CHLORIDE 0.9% FLUSH: 3.0000 mL | Freq: Two times a day (BID) | INTRAVENOUS | Status: DC

## 2023-10-18 MED ORDER — PROPOFOL 10 MG/ML IV BOLUS
INTRAVENOUS | Status: DC | PRN
  Administered 2023-10-18: 100 mg via INTRAVENOUS

## 2023-10-18 MED ORDER — ONDANSETRON HCL 4 MG/2ML IJ SOLN
4.0000 mg | Freq: Four times a day (QID) | INTRAMUSCULAR | Status: DC | PRN
Start: 2023-10-18 — End: 2023-10-19

## 2023-10-18 MED ORDER — OXYCODONE HCL 5 MG PO TABS
5.0000 mg | ORAL_TABLET | ORAL | Status: DC | PRN
  Administered 2023-10-18: 5 mg via ORAL
  Filled 2023-10-18: qty 1

## 2023-10-18 MED ORDER — FENTANYL CITRATE (PF) 100 MCG/2ML IJ SOLN
25.0000 ug | INTRAMUSCULAR | Status: DC | PRN
  Administered 2023-10-18: 50 ug via INTRAVENOUS

## 2023-10-18 MED ORDER — CEFAZOLIN SODIUM-DEXTROSE 2-4 GM/100ML-% IV SOLN
2.0000 g | Freq: Three times a day (TID) | INTRAVENOUS | Status: AC
  Administered 2023-10-18 (×2): 2 g via INTRAVENOUS
  Filled 2023-10-18 (×2): qty 100

## 2023-10-18 MED ORDER — FENTANYL CITRATE (PF) 100 MCG/2ML IJ SOLN
INTRAMUSCULAR | Status: AC
  Filled 2023-10-18: qty 2

## 2023-10-18 MED ORDER — ROCURONIUM BROMIDE 10 MG/ML (PF) SYRINGE
PREFILLED_SYRINGE | INTRAVENOUS | Status: AC
  Filled 2023-10-18: qty 10

## 2023-10-18 MED ORDER — PROPOFOL 10 MG/ML IV BOLUS
INTRAVENOUS | Status: AC
  Filled 2023-10-18: qty 20

## 2023-10-18 MED ORDER — ACETAMINOPHEN 650 MG RE SUPP: 650.0000 mg | RECTAL | Status: DC | PRN

## 2023-10-18 MED ORDER — ACETAMINOPHEN 325 MG PO TABS: 325.0000 mg | ORAL_TABLET | ORAL | Status: DC | PRN

## 2023-10-18 MED ORDER — MENTHOL 3 MG MT LOZG: 1.0000 | LOZENGE | OROMUCOSAL | Status: DC | PRN

## 2023-10-18 MED ORDER — AMIODARONE HCL 100 MG PO TABS
100.0000 mg | ORAL_TABLET | Freq: Every morning | ORAL | Status: DC
Start: 2023-10-19 — End: 2023-10-19
  Administered 2023-10-19: 100 mg via ORAL
  Filled 2023-10-18: qty 1

## 2023-10-18 MED ORDER — SODIUM CHLORIDE 0.9 % IV SOLN
250.0000 mL | INTRAVENOUS | Status: DC
  Administered 2023-10-18: 250 mL via INTRAVENOUS

## 2023-10-18 MED ORDER — PHENOL 1.4 % MT LIQD: 1.0000 | OROMUCOSAL | Status: DC | PRN

## 2023-10-18 MED ORDER — LIDOCAINE 2% (20 MG/ML) 5 ML SYRINGE
INTRAMUSCULAR | Status: DC | PRN
  Administered 2023-10-18: 60 mg via INTRAVENOUS

## 2023-10-18 MED ORDER — DOCUSATE SODIUM 100 MG PO CAPS
100.0000 mg | ORAL_CAPSULE | Freq: Two times a day (BID) | ORAL | Status: DC
  Administered 2023-10-18 – 2023-10-19 (×3): 100 mg via ORAL
  Filled 2023-10-18 (×3): qty 1

## 2023-10-18 MED ORDER — PHENYLEPHRINE HCL-NACL 20-0.9 MG/250ML-% IV SOLN
INTRAVENOUS | Status: DC | PRN
  Administered 2023-10-18: 25 ug/min via INTRAVENOUS

## 2023-10-18 MED ORDER — MORPHINE SULFATE (PF) 2 MG/ML IV SOLN
2.0000 mg | INTRAVENOUS | Status: DC | PRN
Start: 2023-10-18 — End: 2023-10-19

## 2023-10-18 MED ORDER — ONDANSETRON HCL 4 MG/2ML IJ SOLN
INTRAMUSCULAR | Status: DC | PRN
  Administered 2023-10-18: 4 mg via INTRAVENOUS

## 2023-10-18 MED ORDER — FENTANYL CITRATE (PF) 250 MCG/5ML IJ SOLN
INTRAMUSCULAR | Status: DC | PRN
  Administered 2023-10-18: 50 ug via INTRAVENOUS
  Administered 2023-10-18: 100 ug via INTRAVENOUS
  Administered 2023-10-18: 50 ug via INTRAVENOUS

## 2023-10-18 MED ORDER — 0.9 % SODIUM CHLORIDE (POUR BTL) OPTIME
TOPICAL | Status: DC | PRN
  Administered 2023-10-18: 1000 mL

## 2023-10-18 MED ORDER — FENTANYL CITRATE (PF) 250 MCG/5ML IJ SOLN
INTRAMUSCULAR | Status: AC
  Filled 2023-10-18: qty 5

## 2023-10-18 MED ORDER — BISACODYL 10 MG RE SUPP: 10.0000 mg | Freq: Every day | RECTAL | Status: DC | PRN

## 2023-10-18 MED ORDER — THROMBIN 5000 UNITS EX SOLR
OROMUCOSAL | Status: DC | PRN
  Administered 2023-10-18 (×2): 5 mL via TOPICAL

## 2023-10-18 MED ORDER — ROSUVASTATIN CALCIUM 20 MG PO TABS
20.0000 mg | ORAL_TABLET | Freq: Every day | ORAL | Status: DC
  Administered 2023-10-18: 20 mg via ORAL
  Filled 2023-10-18: qty 1

## 2023-10-18 MED ORDER — ONDANSETRON HCL 4 MG/2ML IJ SOLN
INTRAMUSCULAR | Status: AC
  Filled 2023-10-18: qty 2

## 2023-10-18 MED ORDER — VANCOMYCIN HCL 1.5 G IV SOLR
1500.0000 mg | INTRAVENOUS | Status: AC
  Administered 2023-10-18: 1500 mg via INTRAVENOUS
  Filled 2023-10-18: qty 30

## 2023-10-18 MED ORDER — DEXAMETHASONE SODIUM PHOSPHATE 10 MG/ML IJ SOLN
INTRAMUSCULAR | Status: DC | PRN
  Administered 2023-10-18: 5 mg via INTRAVENOUS

## 2023-10-18 MED ORDER — SACUBITRIL-VALSARTAN 49-51 MG PO TABS
1.0000 | ORAL_TABLET | Freq: Two times a day (BID) | ORAL | Status: DC
  Administered 2023-10-18 – 2023-10-19 (×2): 1 via ORAL
  Filled 2023-10-18 (×3): qty 1

## 2023-10-18 SURGICAL SUPPLY — 42 items
BAG COUNTER SPONGE SURGICOUNT (BAG) ×1 IMPLANT
BENZOIN TINCTURE PRP APPL 2/3 (GAUZE/BANDAGES/DRESSINGS) ×1 IMPLANT
BLADE CLIPPER SURG (BLADE) IMPLANT
BUR MATCHSTICK NEURO 3.0 LAGG (BURR) ×1 IMPLANT
BUR PRECISION FLUTE 6.0 (BURR) ×1 IMPLANT
CANISTER SUCT 3000ML PPV (MISCELLANEOUS) ×1 IMPLANT
DRAPE LAPAROTOMY 100X72X124 (DRAPES) ×1 IMPLANT
DRAPE MICROSCOPE SLANT 54X150 (MISCELLANEOUS) ×1 IMPLANT
DRAPE SURG 17X23 STRL (DRAPES) ×4 IMPLANT
DRSG OPSITE POSTOP 4X6 (GAUZE/BANDAGES/DRESSINGS) ×1 IMPLANT
ELECT BLADE 4.0 EZ CLEAN MEGAD (MISCELLANEOUS) ×1
ELECT REM PT RETURN 9FT ADLT (ELECTROSURGICAL) ×1
ELECT SOLID GEL RDN PRO-PADZ (MISCELLANEOUS) ×1
ELECTRODE BLDE 4.0 EZ CLN MEGD (MISCELLANEOUS) ×1 IMPLANT
ELECTRODE REM PT RTRN 9FT ADLT (ELECTROSURGICAL) ×1 IMPLANT
ELECTRODE SOLI GEL RDN PROPADZ (MISCELLANEOUS) IMPLANT
GAUZE 4X4 16PLY ~~LOC~~+RFID DBL (SPONGE) IMPLANT
GAUZE SPONGE 4X4 12PLY STRL (GAUZE/BANDAGES/DRESSINGS) ×1 IMPLANT
GLOVE BIO SURGEON STRL SZ 6 (GLOVE) ×1 IMPLANT
GLOVE BIO SURGEON STRL SZ8 (GLOVE) ×1 IMPLANT
GLOVE BIO SURGEON STRL SZ8.5 (GLOVE) ×1 IMPLANT
GLOVE BIOGEL PI IND STRL 6.5 (GLOVE) ×1 IMPLANT
GLOVE EXAM NITRILE XL STR (GLOVE) IMPLANT
GOWN STRL REUS W/ TWL LRG LVL3 (GOWN DISPOSABLE) ×1 IMPLANT
GOWN STRL REUS W/ TWL XL LVL3 (GOWN DISPOSABLE) ×1 IMPLANT
GOWN STRL REUS W/TWL 2XL LVL3 (GOWN DISPOSABLE) IMPLANT
HEMOSTAT POWDER KIT SURGIFOAM (HEMOSTASIS) ×1 IMPLANT
KIT BASIN OR (CUSTOM PROCEDURE TRAY) ×1 IMPLANT
KIT TURNOVER KIT B (KITS) ×1 IMPLANT
NDL HYPO 22X1.5 SAFETY MO (MISCELLANEOUS) ×1 IMPLANT
NEEDLE HYPO 22X1.5 SAFETY MO (MISCELLANEOUS) ×1 IMPLANT
NS IRRIG 1000ML POUR BTL (IV SOLUTION) ×1 IMPLANT
PACK LAMINECTOMY NEURO (CUSTOM PROCEDURE TRAY) ×1 IMPLANT
PAD ARMBOARD 7.5X6 YLW CONV (MISCELLANEOUS) ×3 IMPLANT
PATTIES SURGICAL .5 X1 (DISPOSABLE) IMPLANT
SPONGE SURGIFOAM ABS GEL SZ50 (HEMOSTASIS) IMPLANT
STRIP CLOSURE SKIN 1/2X4 (GAUZE/BANDAGES/DRESSINGS) ×1 IMPLANT
SUT VIC AB 1 CT1 18XBRD ANBCTR (SUTURE) ×2 IMPLANT
SUT VIC AB 2-0 CP2 18 (SUTURE) ×2 IMPLANT
TOWEL GREEN STERILE (TOWEL DISPOSABLE) ×1 IMPLANT
TOWEL GREEN STERILE FF (TOWEL DISPOSABLE) ×1 IMPLANT
WATER STERILE IRR 1000ML POUR (IV SOLUTION) ×1 IMPLANT

## 2023-10-18 NOTE — Transfer of Care (Signed)
Immediate Anesthesia Transfer of Care Note  Patient: Jason [name]  Procedure(s) Performed: Lumbar Three-Four, Lumbar Four-Five  Laminotomy, Foraminotomy (Bilateral)  Patient Location: PACU  Anesthesia Type:General  Level of Consciousness: awake, alert , and oriented  Airway & Oxygen Therapy: Patient Spontanous Breathing and Patient connected to face mask oxygen  Post-op Assessment: Report given to RN and Post -op Vital signs reviewed and stable  Post vital signs: Reviewed and stable  Last Vitals:  Vitals Value Taken Time  BP 125/67 10/18/23 1400  Temp 37.2 C 10/18/23 1330  Pulse 70 10/18/23 1402  Resp 16 10/18/23 1402  SpO2 98 % 10/18/23 1402  Vitals shown include unfiled device data.  Last Pain:  Vitals:   10/18/23 1330  PainSc: 10-Worst pain ever      Patients Stated Pain Goal: 4 (10/18/23 0920)  Complications: No notable events documented.

## 2023-10-18 NOTE — Op Note (Signed)
Brief history: The patient is a 72 year old white male who has had previous lumbar surgery.  He has complained of back and leg pain consistent with neurogenic claudication.  He has failed medical management and was worked up with a lumbar MRI which demonstrated lumbar spinal stenosis most prominent at L3-4 and L4-5.  I discussed the various treatment options with him.  He has decided proceed with surgery.  Preoperative diagnosis: Lumbar spinal stenosis with neurogenic claudication, lumbago, lumbar radiculopathy  Postoperative diagnosis: The same  Procedure: Bilateral L3-4 and L4-5 laminotomy/foraminotomies   Surgeon: Dr. Delma Officer  Asst.: Dr. Barnett Abu  Anesthesia: Gen. endotracheal  Estimated blood loss: 100 cc  Drains: None  Complications: None  Description of procedure: The patient was brought to the operating room by the anesthesia team. General endotracheal anesthesia was induced. The patient was turned to the prone position on the Wilson frame. The patient's lumbosacral region was then prepared with Betadine scrub and Betadine solution. Sterile drapes were applied.  I then injected the area to be incised with Marcaine with epinephrine solution. I then used a scalpel to make a linear midline incision over the L3-4 and L4-5 intervertebral disc space. I then used electrocautery to perform a left sided subperiosteal dissection exposing the spinous process and lamina of L3-4 and L4-5. We obtained intraoperative radiograph to confirm our location. I then inserted the [facility] retractor for exposure.  I used a high-speed drill to perform a laminotomy at L3-4 and L4-5 on the left. I then used a Kerrison punches to widen the laminotomy and removed the ligamentum flavum at L3-4 and L4-5 on the left.  I then drilled across the midline and performed a right L3-4 and L4-5 laminotomy from the left side.  I also used the Kerrison punch to remove the ligamentum flavum on the right at L3-4 and  L4-5.  We then freed up the thecal sac and the bilateral L4 and L5 nerve root from the epidural tissue. I then used a Kerrison punch to perform a foraminotomy at about the bilateral L4 and L5 nerve roots.  I inspected the interval disc bilaterally at L3-4 and L4-5.  There were no significant herniations.  I therefore did not perform a discectomy.   I then palpated along the ventral surface of the thecal sac and along exit route of the bilateral L4 and L5 nerve root and noted that the neural structures were well decompressed. This completed the decompression.  We then obtained hemostasis using bipolar electrocautery. We irrigated the wound out with saline solution. We then removed the retractor. We then reapproximated the patient's thoracolumbar fascia with interrupted #1 Vicryl suture. We then reapproximated the patient's subcutaneous tissue with interrupted 2-0 Vicryl suture. We then reapproximated patient's skin with Steri-Strips and benzoin. The was then coated with bacitracin ointment. The drapes were removed. The patient was subsequently returned to the supine position where they were extubated by the anesthesia team. The patient was then transported to the postanesthesia care unit in stable condition. All sponge instrument and needle counts were reportedly correct at the end of this case.

## 2023-10-18 NOTE — Anesthesia Postprocedure Evaluation (Signed)
Anesthesia Post Note  Patient: WILKINS MANCE  Procedure(s) Performed: Lumbar Three-Four, Lumbar Four-Five  Laminotomy, Foraminotomy (Bilateral)     Patient location during evaluation: PACU Anesthesia Type: General Level of consciousness: awake and alert Pain management: pain level controlled Vital Signs Assessment: post-procedure vital signs reviewed and stable Respiratory status: spontaneous breathing, nonlabored ventilation, respiratory function stable and patient connected to nasal cannula oxygen Cardiovascular status: blood pressure returned to baseline and stable Postop Assessment: no apparent nausea or vomiting Anesthetic complications: no   No notable events documented.  Last Vitals:  Vitals:   10/18/23 1345 10/18/23 1400  BP: 135/80 125/67  Pulse: 81 66  Resp: 14 14  Temp:    SpO2: 94% 98%    Last Pain:  Vitals:   10/18/23 1400  PainSc: Asleep                 Anisten Tomassi

## 2023-10-18 NOTE — Anesthesia Procedure Notes (Signed)
Procedure Name: Intubation Date/Time: 10/18/2023 10:34 AM  Performed by: April Holding, CRNAPre-anesthesia Checklist: Patient identified, Emergency Drugs available, Suction available and Patient being monitored Patient Re-evaluated:Patient Re-evaluated prior to induction Oxygen Delivery Method: Circle System Utilized Preoxygenation: Pre-oxygenation with 100% oxygen Induction Type: IV induction Ventilation: Mask ventilation without difficulty Laryngoscope Size: Miller and 2 Grade View: Grade II Tube type: Oral Tube size: 7.5 mm Number of attempts: 1 Airway Equipment and Method: Stylet and Oral airway Placement Confirmation: ETT inserted through vocal cords under direct vision, positive ETCO2 and breath sounds checked- equal and bilateral Secured at: 23 cm Tube secured with: Tape Dental Injury: Teeth and Oropharynx as per pre-operative assessment

## 2023-10-18 NOTE — H&P (Signed)
Subjective: The patient is a 72 year old white male with multiple medical problems who has complained of back and left greater right leg pain consistent with neurogenic claudication/more radiculopathy.  He has failed medical management and was worked up with a lumbar MRI which demonstrated spinal stenosis most prominent at L3-4 and L4-5.  I discussed the various treatment options with him.  He has decided proceed with surgery.  Past Medical History:  Diagnosis Date   AICD (automatic cardioverter/defibrillator) present    [employer]   Arthritis    CHF (congestive heart failure) (HCC)    Coronary artery disease involving native coronary artery with unstable angina pectoris (HCC) 04/13/2016   Coronary artery disease with history of myocardial infarction without history of [name]    Dr. 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 [place], [place]. Dr. Elesa Hacker x2   CARDIAC CATHETERIZATION Left 04/13/2016   Procedure: Left Heart Cath and Coronary Angiography;  Surgeon: [name], MD;  Location: [facility] INVASIVE CV LAB;  Service: Cardiovascular;  Laterality: Left;   CARDIOVERSION N/A 08/02/2018   Procedure: CARDIOVERSION;  Surgeon: [name], MD;  Location: [facility] ORS;  Service: Cardiovascular;  Laterality: N/A;   CATARACT EXTRACTION W/PHACO Right 01/23/2016   Procedure: CATARACT EXTRACTION PHACO AND INTRAOCULAR LENS PLACEMENT (IOC);  Surgeon: Galen Manila, MD;  Location: [facility]  ORS;  Service: Ophthalmology;  Laterality: Right;  [place] 01:18 AP% 23.8 CDE 18.67 fluid pack lot # [id number]   CHOLECYSTECTOMY     CORONARY ARTERY BYPASS GRAFT N/A 04/14/2016   Procedure: CORONARY ARTERY BYPASS GRAFTING TIMES FOUR    USING LEFT INTERNAL MAMMARY AND ENDOSCOPIC HARVEST RIGHT SAPHENOUS VEIN;  Surgeon: Purcell Nails, MD;  Location: [facility] OR;  Service: Open Heart Surgery;  Laterality: N/A;   EYE SURGERY Bilateral    Cataract Extraction with IOL implant.   INTRAOPERATIVE TRANSESOPHAGEAL ECHOCARDIOGRAM N/A 04/14/2016   Procedure: INTRAOPERATIVE TRANSESOPHAGEAL ECHOCARDIOGRAM;  Surgeon: Purcell Nails, MD;  Location: [facility] OR;  Service: Open Heart Surgery;  Laterality: N/A;   KNEE ARTHROSCOPY Left 10/08/2015   Procedure: ARTHROSCOPY KNEE, PARTIAL MEDIAL AND LATERAL MENISECTOMY, REMOVAL OF FOREIGN BODY;  Surgeon: Kennedy Bucker, MD;  Location: [facility] ORS;  Service: Orthopedics;  Laterality: Left;    Allergies  Allergen Reactions   Aspirin Anaphylaxis   Inderal [Propranolol]     Other reaction(s): Other (See Comments) Fatigue   Metformin Hcl Diarrhea    Pt still takes medication   Mevacor [Lovastatin] Other (See Comments)    muscle Pain   Penicillin G Hives   Pravachol [Pravastatin Sodium] Other (See Comments)    Muscle Pain   Zocor [Simvastatin] Other (See Comments)    Muscle Pain   Penicillins Rash    Has patient had a PCN reaction causing immediate rash, facial/tongue/throat swelling, SOB or lightheadedness with hypotension: Yes Has patient had  a PCN reaction causing severe rash involving mucus membranes or skin necrosis: Yes Has patient had a PCN reaction that required hospitalization No Has patient had a PCN reaction occurring within the last 10 years: Yes If all of the above answers are "NO", then may proceed with Cephalosporin use.     Social History   Tobacco Use   Smoking status: Never   Smokeless tobacco: Current    Types: Chew   Tobacco comments:    Daily chew   Substance Use Topics   Alcohol use: No    History reviewed. No pertinent family history. Prior to Admission medications   Medication Sig Start Date End Date Taking? Authorizing Provider  acetaminophen (TYLENOL) 500 MG tablet Take 500-1,000 mg by mouth every 6 (six) hours as needed (pain.).   Yes [provider]  amiodarone (PACERONE) 200 MG tablet Take 100 mg by mouth in the morning.   Yes [provider]  apixaban (ELIQUIS) 5 MG TABS tablet Take 5 mg by mouth 2 (two) times daily.   Yes [provider]  dapagliflozin propanediol (FARXIGA) 10 MG TABS tablet Take 10 mg by mouth in the morning.   Yes [provider]  gabapentin (NEURONTIN) 100 MG capsule Take 100 mg by mouth at bedtime as needed (nerve pain.). 06/30/23  Yes [provider]  glimepiride (AMARYL) 4 MG tablet Take 4 mg by mouth 2 (two) times daily. Reported on 04/13/2016   Yes [provider]  levothyroxine (SYNTHROID) 50 MCG tablet Take 50 mcg by mouth daily at 2 am. 08/08/23  Yes [provider]  metFORMIN (GLUCOPHAGE) 1000 MG tablet Take 1,000 mg by mouth in the morning and at bedtime.   Yes [provider]  metoprolol succinate (TOPROL-XL) 100 MG 24 hr tablet Take 100 mg by mouth in the morning. Take with or immediately following a meal.   Yes [provider]  rosuvastatin (CRESTOR) 40 MG tablet Take 20 mg by mouth at bedtime. 07/01/23  Yes [provider]  sacubitril-valsartan (ENTRESTO) 49-51 MG Take 1 tablet by mouth 2 (two) times daily.   Yes [provider]  torsemide (DEMADEX) 20 MG tablet Take 20 mg by mouth in the morning. 11/23/22  Yes [provider]     Review of Systems  Positive ROS: As above  All other systems have been reviewed and were otherwise negative with the exception of those mentioned in the HPI and as above.  Objective: Vital signs in last 24 hours: Temp:  [97.7 F (36.5 C)] 97.7 F (36.5 C)  (12/23 0828) Pulse Rate:  [71] 71 (12/23 0828) Resp:  [18] 18 (12/23 0828) BP: (109)/(59) 109/59 (12/23 0828) SpO2:  [97 %] 97 % (12/23 0828) Weight:  [113.4 kg] 113.4 kg (12/23 0828) Estimated body mass index is 30.43 kg/m as calculated from the following:   Height as of this encounter: 6\' 4"  (1.93 m).   Weight as of this encounter: 113.4 kg.   General Appearance: Alert, obese Head: Normocephalic, without obvious abnormality, atraumatic Eyes: PERRL, conjunctiva/corneas clear, EOM's intact,    Ears: Normal  Throat: Normal  Neck: Supple, Back: unremarkable Lungs: Clear to auscultation bilaterally, respirations unlabored Heart: Regular rate and rhythm, no murmur, rub or gallop Abdomen: Soft, non-tender Extremities: Extremities normal, atraumatic, no cyanosis or edema Skin: unremarkable  NEUROLOGIC:   Mental status: alert and oriented,Motor Exam -he has bilateral lower extremity weakness and ambulates with a walker Sensory Exam - grossly normal Reflexes:  Coordination - grossly  normal Gait -unsteady walker/wheelchair balance -unsteady Cranial Nerves: I: smell Not tested  II: visual acuity  OS: Normal  OD: Normal   II: visual fields Full to confrontation  II: pupils Equal, round, reactive to light  III,VII: ptosis None  III,IV,VI: extraocular muscles  Full ROM  V: mastication Normal  V: facial light touch sensation  Normal  V,VII: corneal reflex  Present  VII: facial muscle function - upper  Normal  VII: facial muscle function - lower Normal  VIII: hearing Not tested  IX: soft palate elevation  Normal  IX,X: gag reflex Present  XI: trapezius strength  5/5  XI: sternocleidomastoid strength 5/5  XI: neck flexion strength  5/5  XII: tongue strength  Normal    Data Review Lab Results  Component Value Date   WBC 6.8 10/13/2023   HGB 14.6 10/13/2023   HCT 43.3 10/13/2023   MCV 91.7 10/13/2023   PLT 119 (L) 10/13/2023   Lab Results  Component Value Date   NA 136  10/13/2023   K 4.0 10/13/2023   CL 104 10/13/2023   CO2 25 10/13/2023   BUN 18 10/13/2023   CREATININE 1.28 (H) 10/13/2023   GLUCOSE 83 10/13/2023   Lab Results  Component Value Date   INR 1.48 04/14/2016    Assessment/Plan: Lumbar spinal stenosis, neurogenic claudication, lumbago: I have discussed the situation with the patient and his wife.  I reviewed his imaging studies with him and pointed out the abnormalities.  We have discussed the various treatment options including surgery.  I described the surgical treatment option of bilateral L3-4 and L4-5 laminotomy/foraminotomies.  I have shown them surgical models.  I have given him a surgical pamphlet.  We have discussed the risk, benefits, alternatives, expected postop course, and likelihood of achieving our goals with surgery.  I have answered all their questions.  He has decided proceed with surgery.   Cristi Loron 10/18/2023 10:11 AM

## 2023-10-19 ENCOUNTER — Encounter (HOSPITAL_COMMUNITY): Payer: Self-pay | Admitting: Neurosurgery

## 2023-10-19 ENCOUNTER — Other Ambulatory Visit (HOSPITAL_COMMUNITY): Payer: Self-pay

## 2023-10-19 DIAGNOSIS — E119 Type 2 diabetes mellitus without complications: Secondary | ICD-10-CM | POA: Diagnosis not present

## 2023-10-19 DIAGNOSIS — Z7984 Long term (current) use of oral hypoglycemic drugs: Secondary | ICD-10-CM | POA: Diagnosis not present

## 2023-10-19 DIAGNOSIS — I509 Heart failure, unspecified: Secondary | ICD-10-CM | POA: Diagnosis not present

## 2023-10-19 DIAGNOSIS — M48062 Spinal stenosis, lumbar region with neurogenic claudication: Secondary | ICD-10-CM | POA: Diagnosis not present

## 2023-10-19 DIAGNOSIS — I2510 Atherosclerotic heart disease of native coronary artery without angina pectoris: Secondary | ICD-10-CM | POA: Diagnosis not present

## 2023-10-19 DIAGNOSIS — I110 Hypertensive heart disease with heart failure: Secondary | ICD-10-CM | POA: Diagnosis not present

## 2023-10-19 LAB — GLUCOSE, CAPILLARY: Glucose-Capillary: 118 mg/dL — ABNORMAL HIGH (ref 70–99)

## 2023-10-19 MED ORDER — CYCLOBENZAPRINE HCL 10 MG PO TABS
10.0000 mg | ORAL_TABLET | Freq: Three times a day (TID) | ORAL | 0 refills | Status: DC | PRN
  Filled 2023-10-19: qty 90, 30d supply, fill #0

## 2023-10-19 MED ORDER — OXYCODONE HCL 10 MG PO TABS
10.0000 mg | ORAL_TABLET | ORAL | 0 refills | Status: DC | PRN
  Filled 2023-10-19: qty 30, 5d supply, fill #0

## 2023-10-19 MED ORDER — DOCUSATE SODIUM 100 MG PO CAPS
100.0000 mg | ORAL_CAPSULE | Freq: Two times a day (BID) | ORAL | 0 refills | Status: DC
  Filled 2023-10-19: qty 60, 30d supply, fill #0

## 2023-10-19 MED FILL — Thrombin For Soln 5000 Unit: CUTANEOUS | Qty: 5000 | Status: AC

## 2023-10-19 NOTE — Plan of Care (Signed)
  Problem: Education: Goal: Knowledge of General Education information will improve Description: Including pain rating scale, medication(s)/side effects and non-pharmacologic comfort measures Outcome: Completed/Met   Problem: Health Behavior/Discharge Planning: Goal: Ability to manage health-related needs will improve Outcome: Completed/Met   Problem: Clinical Measurements: Goal: Ability to maintain clinical measurements within normal limits will improve Outcome: Completed/Met Goal: Will remain free from infection Outcome: Completed/Met Goal: Diagnostic test results will improve Outcome: Completed/Met Goal: Respiratory complications will improve Outcome: Completed/Met Goal: Cardiovascular complication will be avoided Outcome: Completed/Met   Problem: Activity: Goal: Risk for activity intolerance will decrease Outcome: Completed/Met   Problem: Nutrition: Goal: Adequate nutrition will be maintained Outcome: Completed/Met   Problem: Coping: Goal: Level of anxiety will decrease Outcome: Completed/Met   Problem: Elimination: Goal: Will not experience complications related to bowel motility Outcome: Completed/Met Goal: Will not experience complications related to urinary retention Outcome: Completed/Met   Problem: Pain Management: Goal: General experience of comfort will improve Outcome: Completed/Met   Problem: Safety: Goal: Ability to remain free from injury will improve Outcome: Completed/Met   Problem: Coping: Goal: Ability to adjust to condition or change in health will improve Outcome: Completed/Met   Problem: Fluid Volume: Goal: Ability to maintain a balanced intake and output will improve Outcome: Completed/Met

## 2023-10-19 NOTE — Evaluation (Signed)
Physical Therapy Brief Evaluation and Discharge Note Patient Details Name: Jason [name] MRN: [id number] DOB: [birth year] Today's Date: 10/19/2023   History of Present Illness  Jason [name] is a 72 y.o. male who was admitted following an uncomplicated L3-4, L4-5 laminotomy/foraminotomies .  See MD H&P for PMH  Clinical Impression  Pt status post above surgery.  Mobilizing well and feels ready for DC home today.  I have answered all patient's question regarding PT and mobility.    I have encouraged the patient to gradually increase activity daily to tolerance.   Utilized teach back to instruct on back safety s/p surgery.           PT Assessment Patient does not need any further PT services  Assistance Needed at Discharge  PRN    Equipment Recommendations Rolling walker (2 wheels)  Recommendations for Other Services       Precautions/Restrictions Precautions Precautions: Back Precaution Booklet Issued: Yes (comment) Restrictions Weight Bearing Restrictions Per Provider Order: No        Mobility  Bed Mobility       General bed mobility comments: Pt sitting up on EOB when PT arrived, getting dressed  Transfers Overall transfer level: Modified independent Equipment used: Rolling walker (2 wheels)               General transfer comment: increased time and effort.  Reliant on UEs to power up.    Ambulation/Gait Ambulation/Gait assistance: Supervision Gait Distance (Feet): 120 Feet Assistive device: Rolling walker (2 wheels) Gait Pattern/deviations: Step-through pattern, Trunk flexed   General Gait Details: pt reliant on UEs to support some of his boby weight. Encouraged upright posture  Home Activity Instructions Home Activity Instructions: Gradually increase activity daily to tolerance. Walking is the best exercise for him to do at this time.  Stairs Stairs:  (Pt declined practice today and was able to describe a safe technique he utilizes to go up/down  his stars at home)          Modified Rankin (Stroke Patients Only)        Balance                          Pertinent Vitals/Pain   Pain Assessment Pain Assessment: 0-10 Pain Score: 2  Pain Location: low back Pain Descriptors / Indicators: Sore Pain Intervention(s): Limited activity within patient's tolerance, Monitored during session     Home Living Family/patient expects to be discharged to:: Private residence Living Arrangements: Spouse/significant other Available Help at Discharge: Available 24 hours/day Home Environment: Stairs to enter;Rail - right  Stairs-Number of Steps: 4 Home Equipment: Other (comment) (Has old, used RW that was not his)        Prior Function Level of Independence: Independent with assistive device(s) Comments: Mobility limited last several months due to back pain    UE/LE Assessment   UE ROM/Strength/Tone/Coordination: WFL    LE ROM/Strength/Tone/Coordination: Generalized weakness      Communication   Communication Communication: No apparent difficulties     Cognition Overall Cognitive Status: Appears within functional limits for tasks assessed/performed       General Comments General comments (skin integrity, edema, etc.): NO family present. Pt very appreciative of PT intervention and teaching.    Exercises     Assessment/Plan    PT Problem List         PT Visit Diagnosis Muscle weakness (generalized) (M62.81)    No Skilled PT All education  completed;Patient is supervision for all activity/mobility   Co-evaluation                AMPAC 6 Clicks Help needed turning from your back to your side while in a flat bed without using bedrails?: None Help needed moving from lying on your back to sitting on the side of a flat bed without using bedrails?: None Help needed moving to and from a bed to a chair (including a wheelchair)?: A Little Help needed standing up from a chair using your arms (e.g.,  wheelchair or bedside chair)?: A Little Help needed to walk in hospital room?: A Little Help needed climbing 3-5 steps with a railing? : A Little 6 Click Score: 20      End of Session Equipment Utilized During Treatment: Gait belt Activity Tolerance: Patient tolerated treatment well Patient left: in bed;with call bell/phone within reach Nurse Communication: Mobility status PT Visit Diagnosis: Muscle weakness (generalized) (M62.81)     Time: 0454-0981 PT Time Calculation (min) (ACUTE ONLY): 33 min  Charges:   PT Evaluation $PT Eval Low Complexity: 1 Low PT Treatments $Gait Training: 8-22 mins    Lavona Mound, PT   Acute Rehabilitation Services  Office [phone] 10/19/2023   Donnella Sham  10/19/2023, 11:31 AM

## 2023-10-19 NOTE — Discharge Summary (Cosign Needed)
Patient ID: Jason [name] MRN: [id number] DOB/AGE: 73-Mar-1952 73 y.o.  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 male who was admitted following an uncomplicated L3-4, L4-5 laminotomy/foraminotomies . They were recovered in PACU and transferred to [facility] Hospital course was uncomplicated. Pt stable for discharge today. Pt to f/u in office for routine post op visit. Pt is in agreement w/ plan.    Discharge Exam: Blood pressure (!) 123/56, pulse 82, temperature 98.8 F (37.1 C), temperature source Oral, resp. rate 18, height 6\' 4"  (1.93 m), weight 113.4 kg, SpO2 96%. A&O x3 Speech fluent, appropriate Strength intact BUE, decreased BLE SILTx4.  Dressing c/d/I.   Disposition: Discharge disposition: 01-Home or Self Care        Allergies as of 10/19/2023       Reactions   Aspirin Anaphylaxis   Inderal [propranolol]    Other reaction(s): Other (See Comments) Fatigue   Metformin Hcl Diarrhea   Pt still takes medication   Mevacor [lovastatin] Other (See Comments)   muscle Pain   Penicillin G Hives   Pravachol [pravastatin Sodium] Other (See Comments)   Muscle Pain   Zocor [simvastatin] Other (See Comments)   Muscle Pain   Penicillins Rash   Has patient had a PCN reaction causing immediate rash, facial/tongue/throat swelling, SOB or lightheadedness with hypotension: Yes Has patient had a PCN reaction causing severe rash involving mucus membranes or skin necrosis: Yes Has patient had a PCN reaction that required hospitalization No Has patient had a PCN reaction occurring within the last 10 years: Yes If all of the above answers are "NO", then may proceed with Cephalosporin use.        Medication  List     TAKE these medications    acetaminophen 500 MG tablet Commonly known as: TYLENOL Take 500-1,000 mg by mouth every 6 (six) hours as needed (pain.).   amiodarone 200 MG tablet Commonly known as: PACERONE Take 100 mg by mouth in the morning.   cyclobenzaprine 10 MG tablet Commonly known as: FLEXERIL Take 1 tablet (10 mg total) by mouth 3 (three) times daily as needed for muscle spasms.   docusate sodium 100 MG capsule Commonly known as: Colace Take 1 capsule (100 mg total) by mouth 2 (two) times daily.   Eliquis 5 MG Tabs tablet Generic drug: apixaban Take 1 tablet (5 mg total) by mouth 2 (two) times daily. Start taking on: October 25, 2023 What changed: These instructions start on October 25, 2023. If you are unsure what to do until then, ask your doctor or other care provider.   Entresto 49-51 MG Generic drug: sacubitril-valsartan Take 1 tablet by mouth 2 (two) times daily.   Farxiga 10 MG Tabs tablet Generic drug: dapagliflozin propanediol Take 10 mg by mouth in the morning.   gabapentin 100 MG capsule Commonly known as: NEURONTIN Take 100 mg by mouth at bedtime as needed (nerve pain.).   glimepiride 4 MG tablet Commonly known as: AMARYL Take 4 mg by mouth 2 (two) times daily. Reported on 04/13/2016   levothyroxine 50 MCG tablet Commonly known as: SYNTHROID Take 50 mcg by mouth daily at 2 am.   metFORMIN 1000 MG tablet Commonly known as: GLUCOPHAGE Take 1,000 mg by mouth in the morning and at  bedtime.   metoprolol succinate 100 MG 24 hr tablet Commonly known as: TOPROL-XL Take 100 mg by mouth in the morning. Take with or immediately following a meal.   Oxycodone HCl 10 MG Tabs Take 1 tablet (10 mg total) by mouth every 4 (four) hours as needed for severe pain (pain score 7-10).   rosuvastatin 40 MG tablet Commonly known as: CRESTOR Take 20 mg by mouth at bedtime.   torsemide 20 MG tablet Commonly known as: DEMADEX Take 20 mg by mouth in the  morning.         Signed: Clovis Riley 10/19/2023, 9:23 AM

## 2023-10-19 NOTE — Progress Notes (Signed)
Patient alert and oriented, void and ambulate. Surgical site clean and dry no sign of infection. D/c instructions explain and given to the patient, all questions answered. Patient d/c home with RW per order.

## 2023-10-19 NOTE — TOC Transition Note (Signed)
Transition of Care [facility]) - Discharge Note   Patient Details  Name: Jason [name] MRN: [id number] Date of Birth: [birth year]  Transition of [facility] CM/SW Contact:  Gordy Clement, RN Phone Number: 10/19/2023, 9:42 AM   Clinical Narrative:     Patient to DC to home today  No TOC needs identified  Family to transport            Patient Goals and CMS Choice            Discharge Placement                       Discharge Plan and Services Additional resources added to the After Visit Summary for                                       Social Drivers of Health (SDOH) Interventions SDOH Screenings   Food Insecurity: No Food Insecurity (08/02/2018)  Transportation Needs: No Transportation Needs (08/02/2018)  Financial Resource Strain: Low Risk  (08/02/2018)  Physical Activity: Unknown (08/02/2018)  Social Connections: Unknown (08/02/2018)  Stress: No Stress Concern Present (08/02/2018)  Tobacco Use: High Risk (10/18/2023)     Readmission Risk Interventions     No data to display

## 2023-10-24 ENCOUNTER — Other Ambulatory Visit: Payer: Self-pay

## 2023-10-24 ENCOUNTER — Emergency Department: Payer: Medicare Other

## 2023-10-24 ENCOUNTER — Inpatient Hospital Stay: Payer: Medicare Other

## 2023-10-24 ENCOUNTER — Inpatient Hospital Stay
Admission: EM | Admit: 2023-10-24 | Discharge: 2023-11-04 | DRG: 871 | Disposition: A | Discharge status: Skilled Nursing Facility | Payer: Medicare Other | Attending: Student | Admitting: Student

## 2023-10-24 DIAGNOSIS — N189 Chronic kidney disease, unspecified: Secondary | ICD-10-CM

## 2023-10-24 DIAGNOSIS — E039 Hypothyroidism, unspecified: Secondary | ICD-10-CM | POA: Diagnosis present

## 2023-10-24 DIAGNOSIS — I2489 Other forms of acute ischemic heart disease: Secondary | ICD-10-CM | POA: Diagnosis present

## 2023-10-24 DIAGNOSIS — Z95810 Presence of automatic (implantable) cardiac defibrillator: Secondary | ICD-10-CM

## 2023-10-24 DIAGNOSIS — Z888 Allergy status to other drugs, medicaments and biological substances status: Secondary | ICD-10-CM

## 2023-10-24 DIAGNOSIS — T402X5A Adverse effect of other opioids, initial encounter: Secondary | ICD-10-CM | POA: Diagnosis present

## 2023-10-24 DIAGNOSIS — R112 Nausea with vomiting, unspecified: Secondary | ICD-10-CM | POA: Diagnosis not present

## 2023-10-24 DIAGNOSIS — I959 Hypotension, unspecified: Secondary | ICD-10-CM | POA: Diagnosis not present

## 2023-10-24 DIAGNOSIS — Z9841 Cataract extraction status, right eye: Secondary | ICD-10-CM

## 2023-10-24 DIAGNOSIS — Z6833 Body mass index (BMI) 33.0-33.9, adult: Secondary | ICD-10-CM

## 2023-10-24 DIAGNOSIS — Z9842 Cataract extraction status, left eye: Secondary | ICD-10-CM

## 2023-10-24 DIAGNOSIS — I6523 Occlusion and stenosis of bilateral carotid arteries: Secondary | ICD-10-CM | POA: Diagnosis not present

## 2023-10-24 DIAGNOSIS — N179 Acute kidney failure, unspecified: Secondary | ICD-10-CM | POA: Diagnosis not present

## 2023-10-24 DIAGNOSIS — K56609 Unspecified intestinal obstruction, unspecified as to partial versus complete obstruction: Secondary | ICD-10-CM | POA: Diagnosis not present

## 2023-10-24 DIAGNOSIS — E872 Acidosis, unspecified: Secondary | ICD-10-CM | POA: Diagnosis present

## 2023-10-24 DIAGNOSIS — E669 Obesity, unspecified: Secondary | ICD-10-CM | POA: Diagnosis present

## 2023-10-24 DIAGNOSIS — E871 Hypo-osmolality and hyponatremia: Secondary | ICD-10-CM | POA: Diagnosis present

## 2023-10-24 DIAGNOSIS — K30 Functional dyspepsia: Secondary | ICD-10-CM | POA: Diagnosis not present

## 2023-10-24 DIAGNOSIS — Z880 Allergy status to penicillin: Secondary | ICD-10-CM

## 2023-10-24 DIAGNOSIS — I25110 Atherosclerotic heart disease of native coronary artery with unstable angina pectoris: Secondary | ICD-10-CM | POA: Diagnosis present

## 2023-10-24 DIAGNOSIS — E1122 Type 2 diabetes mellitus with diabetic chronic kidney disease: Secondary | ICD-10-CM | POA: Diagnosis present

## 2023-10-24 DIAGNOSIS — Z23 Encounter for immunization: Secondary | ICD-10-CM

## 2023-10-24 DIAGNOSIS — K567 Ileus, unspecified: Principal | ICD-10-CM | POA: Diagnosis present

## 2023-10-24 DIAGNOSIS — I10 Essential (primary) hypertension: Secondary | ICD-10-CM | POA: Diagnosis present

## 2023-10-24 DIAGNOSIS — R1110 Vomiting, unspecified: Secondary | ICD-10-CM | POA: Diagnosis not present

## 2023-10-24 DIAGNOSIS — N170 Acute kidney failure with tubular necrosis: Secondary | ICD-10-CM | POA: Diagnosis present

## 2023-10-24 DIAGNOSIS — I5022 Chronic systolic (congestive) heart failure: Secondary | ICD-10-CM | POA: Diagnosis present

## 2023-10-24 DIAGNOSIS — D72829 Elevated white blood cell count, unspecified: Secondary | ICD-10-CM | POA: Diagnosis not present

## 2023-10-24 DIAGNOSIS — R531 Weakness: Secondary | ICD-10-CM | POA: Diagnosis not present

## 2023-10-24 DIAGNOSIS — E782 Mixed hyperlipidemia: Secondary | ICD-10-CM | POA: Diagnosis present

## 2023-10-24 DIAGNOSIS — R0902 Hypoxemia: Secondary | ICD-10-CM | POA: Diagnosis not present

## 2023-10-24 DIAGNOSIS — M48062 Spinal stenosis, lumbar region with neurogenic claudication: Secondary | ICD-10-CM | POA: Diagnosis present

## 2023-10-24 DIAGNOSIS — R1084 Generalized abdominal pain: Secondary | ICD-10-CM | POA: Diagnosis not present

## 2023-10-24 DIAGNOSIS — R748 Abnormal levels of other serum enzymes: Secondary | ICD-10-CM | POA: Diagnosis not present

## 2023-10-24 DIAGNOSIS — I480 Paroxysmal atrial fibrillation: Secondary | ICD-10-CM | POA: Diagnosis present

## 2023-10-24 DIAGNOSIS — Z4682 Encounter for fitting and adjustment of non-vascular catheter: Secondary | ICD-10-CM | POA: Diagnosis not present

## 2023-10-24 DIAGNOSIS — A419 Sepsis, unspecified organism: Principal | ICD-10-CM | POA: Diagnosis present

## 2023-10-24 DIAGNOSIS — I130 Hypertensive heart and chronic kidney disease with heart failure and stage 1 through stage 4 chronic kidney disease, or unspecified chronic kidney disease: Secondary | ICD-10-CM | POA: Diagnosis present

## 2023-10-24 DIAGNOSIS — K6389 Other specified diseases of intestine: Secondary | ICD-10-CM | POA: Diagnosis not present

## 2023-10-24 DIAGNOSIS — L89152 Pressure ulcer of sacral region, stage 2: Secondary | ICD-10-CM | POA: Diagnosis present

## 2023-10-24 DIAGNOSIS — Z951 Presence of aortocoronary bypass graft: Secondary | ICD-10-CM

## 2023-10-24 DIAGNOSIS — E11649 Type 2 diabetes mellitus with hypoglycemia without coma: Secondary | ICD-10-CM | POA: Diagnosis not present

## 2023-10-24 DIAGNOSIS — E876 Hypokalemia: Secondary | ICD-10-CM | POA: Insufficient documentation

## 2023-10-24 DIAGNOSIS — J9811 Atelectasis: Secondary | ICD-10-CM | POA: Diagnosis not present

## 2023-10-24 DIAGNOSIS — K7200 Acute and subacute hepatic failure without coma: Secondary | ICD-10-CM | POA: Diagnosis present

## 2023-10-24 DIAGNOSIS — R29818 Other symptoms and signs involving the nervous system: Secondary | ICD-10-CM | POA: Diagnosis not present

## 2023-10-24 DIAGNOSIS — N1831 Chronic kidney disease, stage 3a: Secondary | ICD-10-CM | POA: Diagnosis present

## 2023-10-24 DIAGNOSIS — I672 Cerebral atherosclerosis: Secondary | ICD-10-CM | POA: Diagnosis not present

## 2023-10-24 DIAGNOSIS — Z79899 Other long term (current) drug therapy: Secondary | ICD-10-CM

## 2023-10-24 DIAGNOSIS — R6521 Severe sepsis with septic shock: Secondary | ICD-10-CM | POA: Diagnosis present

## 2023-10-24 DIAGNOSIS — R600 Localized edema: Secondary | ICD-10-CM | POA: Diagnosis not present

## 2023-10-24 DIAGNOSIS — R6520 Severe sepsis without septic shock: Secondary | ICD-10-CM

## 2023-10-24 DIAGNOSIS — M79605 Pain in left leg: Secondary | ICD-10-CM | POA: Diagnosis not present

## 2023-10-24 DIAGNOSIS — N281 Cyst of kidney, acquired: Secondary | ICD-10-CM | POA: Diagnosis not present

## 2023-10-24 DIAGNOSIS — Z7185 Encounter for immunization safety counseling: Secondary | ICD-10-CM

## 2023-10-24 DIAGNOSIS — G8929 Other chronic pain: Secondary | ICD-10-CM | POA: Diagnosis present

## 2023-10-24 DIAGNOSIS — Z79890 Hormone replacement therapy (postmenopausal): Secondary | ICD-10-CM

## 2023-10-24 DIAGNOSIS — Z7901 Long term (current) use of anticoagulants: Secondary | ICD-10-CM

## 2023-10-24 DIAGNOSIS — Z7984 Long term (current) use of oral hypoglycemic drugs: Secondary | ICD-10-CM

## 2023-10-24 DIAGNOSIS — R578 Other shock: Secondary | ICD-10-CM | POA: Diagnosis not present

## 2023-10-24 DIAGNOSIS — R140 Abdominal distension (gaseous): Secondary | ICD-10-CM | POA: Diagnosis not present

## 2023-10-24 DIAGNOSIS — E162 Hypoglycemia, unspecified: Secondary | ICD-10-CM | POA: Diagnosis not present

## 2023-10-24 DIAGNOSIS — Z886 Allergy status to analgesic agent status: Secondary | ICD-10-CM

## 2023-10-24 DIAGNOSIS — N400 Enlarged prostate without lower urinary tract symptoms: Secondary | ICD-10-CM | POA: Diagnosis not present

## 2023-10-24 DIAGNOSIS — Z8249 Family history of ischemic heart disease and other diseases of the circulatory system: Secondary | ICD-10-CM

## 2023-10-24 DIAGNOSIS — K429 Umbilical hernia without obstruction or gangrene: Secondary | ICD-10-CM | POA: Diagnosis not present

## 2023-10-24 DIAGNOSIS — I82402 Acute embolism and thrombosis of unspecified deep veins of left lower extremity: Secondary | ICD-10-CM | POA: Diagnosis not present

## 2023-10-24 DIAGNOSIS — I252 Old myocardial infarction: Secondary | ICD-10-CM

## 2023-10-24 DIAGNOSIS — R000 Tachycardia, unspecified: Secondary | ICD-10-CM | POA: Diagnosis not present

## 2023-10-24 DIAGNOSIS — R1111 Vomiting without nausea: Secondary | ICD-10-CM | POA: Diagnosis not present

## 2023-10-24 DIAGNOSIS — I4891 Unspecified atrial fibrillation: Secondary | ICD-10-CM | POA: Insufficient documentation

## 2023-10-24 DIAGNOSIS — M79604 Pain in right leg: Secondary | ICD-10-CM | POA: Diagnosis not present

## 2023-10-24 DIAGNOSIS — F17220 Nicotine dependence, chewing tobacco, uncomplicated: Secondary | ICD-10-CM | POA: Diagnosis present

## 2023-10-24 DIAGNOSIS — E119 Type 2 diabetes mellitus without complications: Secondary | ICD-10-CM

## 2023-10-24 LAB — CBG MONITORING, ED
Glucose-Capillary: 169 mg/dL — ABNORMAL HIGH (ref 70–99)
Glucose-Capillary: <10 mg/dL — CL (ref 70–99)
Glucose-Capillary: 12 mg/dL — CL (ref 70–99)
Glucose-Capillary: 48 mg/dL — ABNORMAL LOW (ref 70–99)
Glucose-Capillary: 75 mg/dL (ref 70–99)
Glucose-Capillary: 43 mg/dL — CL (ref 70–99)
Glucose-Capillary: 96 mg/dL (ref 70–99)
Glucose-Capillary: 51 mg/dL — ABNORMAL LOW (ref 70–99)
Glucose-Capillary: 76 mg/dL (ref 70–99)

## 2023-10-24 LAB — COMPREHENSIVE METABOLIC PANEL
ALT: 118 U/L — ABNORMAL HIGH (ref 0–44)
AST: 84 U/L — ABNORMAL HIGH (ref 15–41)
Albumin: 3.6 g/dL (ref 3.5–5.0)
Alkaline Phosphatase: 57 U/L (ref 38–126)
Anion gap: 24 — ABNORMAL HIGH (ref 5–15)
BUN: 89 mg/dL — ABNORMAL HIGH (ref 8–23)
CO2: 13 mmol/L — ABNORMAL LOW (ref 22–32)
Calcium: 9.5 mg/dL (ref 8.9–10.3)
Chloride: 93 mmol/L — ABNORMAL LOW (ref 98–111)
Creatinine, Ser: 3.81 mg/dL — ABNORMAL HIGH (ref 0.61–1.24)
GFR, Estimated: 16 mL/min — ABNORMAL LOW (ref >60)
Glucose, Bld: 166 mg/dL — ABNORMAL HIGH (ref 70–99)
Potassium: 4.4 mmol/L (ref 3.5–5.1)
Sodium: 130 mmol/L — ABNORMAL LOW (ref 135–145)
Total Bilirubin: 1.9 mg/dL — ABNORMAL HIGH (ref <1.2)
Total Protein: 7.2 g/dL (ref 6.5–8.1)

## 2023-10-24 LAB — TROPONIN I (HIGH SENSITIVITY)
Troponin I (High Sensitivity): 24 ng/L — ABNORMAL HIGH (ref <18)
Troponin I (High Sensitivity): 30 ng/L — ABNORMAL HIGH (ref <18)

## 2023-10-24 LAB — LACTIC ACID, PLASMA
Lactic Acid, Venous: 8.4 mmol/L (ref 0.5–1.9)
Lactic Acid, Venous: 8.8 mmol/L (ref 0.5–1.9)
Lactic Acid, Venous: 6.1 mmol/L (ref 0.5–1.9)

## 2023-10-24 LAB — CBC
HCT: 40.5 % (ref 39.0–52.0)
Hemoglobin: 13.9 g/dL (ref 13.0–17.0)
MCH: 31 pg (ref 26.0–34.0)
MCHC: 34.3 g/dL (ref 30.0–36.0)
MCV: 90.4 fL (ref 80.0–100.0)
Platelets: 244 10*3/uL (ref 150–400)
RBC: 4.48 MIL/uL (ref 4.22–5.81)
RDW: 13.3 % (ref 11.5–15.5)
WBC: 11.1 10*3/uL — ABNORMAL HIGH (ref 4.0–10.5)
nRBC: 0 % (ref 0.0–0.2)

## 2023-10-24 LAB — PHOSPHORUS: Phosphorus: 4.2 mg/dL (ref 2.5–4.6)

## 2023-10-24 LAB — GLUCOSE, CAPILLARY
Glucose-Capillary: 121 mg/dL — ABNORMAL HIGH (ref 70–99)
Glucose-Capillary: 20 mg/dL — CL (ref 70–99)
Glucose-Capillary: 27 mg/dL — CL (ref 70–99)
Glucose-Capillary: 66 mg/dL — ABNORMAL LOW (ref 70–99)
Glucose-Capillary: <10 mg/dL — CL (ref 70–99)
Glucose-Capillary: 122 mg/dL — ABNORMAL HIGH (ref 70–99)
Glucose-Capillary: 126 mg/dL — ABNORMAL HIGH (ref 70–99)

## 2023-10-24 LAB — PROCALCITONIN: Procalcitonin: 11.21 ng/mL

## 2023-10-24 MED ORDER — SODIUM CHLORIDE 0.9 % IV SOLN
2.0000 g | INTRAVENOUS | Status: DC
  Administered 2023-10-24: 2 g via INTRAVENOUS
  Filled 2023-10-24: qty 20

## 2023-10-24 MED ORDER — LACTATED RINGERS IV SOLN
INTRAVENOUS | Status: DC
  Administered 2023-10-24: 100 mL via INTRAVENOUS

## 2023-10-24 MED ORDER — DEXTROSE-SODIUM CHLORIDE 5-0.9 % IV SOLN: INTRAVENOUS | Status: DC

## 2023-10-24 MED ORDER — DEXTROSE 50 % IV SOLN
INTRAVENOUS | Status: AC
  Filled 2023-10-24: qty 50

## 2023-10-24 MED ORDER — INSULIN ASPART 100 UNIT/ML IJ SOLN
0.0000 [IU] | Freq: Every day | INTRAMUSCULAR | Status: DC
  Administered 2023-10-25: 2 [IU] via SUBCUTANEOUS
  Filled 2023-10-24: qty 1

## 2023-10-24 MED ORDER — MORPHINE SULFATE (PF) 2 MG/ML IV SOLN: 2.0000 mg | INTRAVENOUS | Status: AC | PRN

## 2023-10-24 MED ORDER — MORPHINE SULFATE (PF) 4 MG/ML IV SOLN
4.0000 mg | Freq: Once | INTRAVENOUS | Status: AC
  Administered 2023-10-24: 4 mg via INTRAVENOUS
  Filled 2023-10-24: qty 1

## 2023-10-24 MED ORDER — ACETAMINOPHEN 325 MG PO TABS
650.0000 mg | ORAL_TABLET | Freq: Four times a day (QID) | ORAL | Status: AC | PRN
  Administered 2023-10-27 – 2023-10-29 (×2): 650 mg via ORAL
  Filled 2023-10-24 (×3): qty 2

## 2023-10-24 MED ORDER — ACETAMINOPHEN 650 MG RE SUPP: 650.0000 mg | Freq: Four times a day (QID) | RECTAL | Status: DC | PRN

## 2023-10-24 MED ORDER — CYCLOBENZAPRINE HCL 10 MG PO TABS
10.0000 mg | ORAL_TABLET | Freq: Three times a day (TID) | ORAL | Status: DC | PRN
  Filled 2023-10-24: qty 1

## 2023-10-24 MED ORDER — INSULIN ASPART 100 UNIT/ML IJ SOLN
0.0000 [IU] | Freq: Three times a day (TID) | INTRAMUSCULAR | Status: DC
  Administered 2023-10-25 – 2023-10-26 (×4): 2 [IU] via SUBCUTANEOUS
  Administered 2023-10-26: 3 [IU] via SUBCUTANEOUS
  Filled 2023-10-24 (×4): qty 1

## 2023-10-24 MED ORDER — MORPHINE SULFATE (PF) 4 MG/ML IV SOLN: 6.0000 mg | Freq: Once | INTRAVENOUS | Status: DC

## 2023-10-24 MED ORDER — FENTANYL CITRATE PF 50 MCG/ML IJ SOSY: 50.0000 ug | PREFILLED_SYRINGE | INTRAMUSCULAR | Status: AC | PRN

## 2023-10-24 MED ORDER — ONDANSETRON HCL 4 MG/2ML IJ SOLN: 4.0000 mg | Freq: Four times a day (QID) | INTRAMUSCULAR | Status: AC | PRN

## 2023-10-24 MED ORDER — ONDANSETRON HCL 4 MG PO TABS: 4.0000 mg | ORAL_TABLET | Freq: Four times a day (QID) | ORAL | Status: AC | PRN

## 2023-10-24 MED ORDER — METOPROLOL SUCCINATE ER 50 MG PO TB24: 100.0000 mg | ORAL_TABLET | Freq: Every morning | ORAL | Status: DC

## 2023-10-24 MED ORDER — SODIUM CHLORIDE 0.9 % IV BOLUS
1000.0000 mL | Freq: Once | INTRAVENOUS | Status: AC
  Administered 2023-10-24: 1000 mL via INTRAVENOUS

## 2023-10-24 MED ORDER — LEVOTHYROXINE SODIUM 50 MCG PO TABS
50.0000 ug | ORAL_TABLET | Freq: Every day | ORAL | Status: DC
  Filled 2023-10-24: qty 1

## 2023-10-24 MED ORDER — GLUCAGON HCL RDNA (DIAGNOSTIC) 1 MG IJ SOLR
1.0000 mg | Freq: Once | INTRAMUSCULAR | Status: AC
  Administered 2023-10-24: 1 mg via INTRAVENOUS
  Filled 2023-10-24: qty 1

## 2023-10-24 MED ORDER — METOPROLOL TARTRATE 5 MG/5ML IV SOLN: 5.0000 mg | INTRAVENOUS | Status: DC | PRN

## 2023-10-24 MED ORDER — HEPARIN SODIUM (PORCINE) 5000 UNIT/ML IJ SOLN
5000.0000 [IU] | Freq: Three times a day (TID) | INTRAMUSCULAR | Status: DC
  Administered 2023-10-24 – 2023-10-27 (×9): 5000 [IU] via SUBCUTANEOUS
  Filled 2023-10-24 (×9): qty 1

## 2023-10-24 MED ORDER — PANTOPRAZOLE SODIUM 40 MG IV SOLR
40.0000 mg | Freq: Two times a day (BID) | INTRAVENOUS | Status: AC
  Administered 2023-10-24 – 2023-10-25 (×3): 40 mg via INTRAVENOUS
  Filled 2023-10-24 (×3): qty 10

## 2023-10-24 MED ORDER — DEXTROSE 50 % IV SOLN
1.0000 | INTRAVENOUS | Status: AC | PRN
  Administered 2023-10-24: 50 mL via INTRAVENOUS
  Administered 2023-10-24: 25 mL via INTRAVENOUS
  Administered 2023-10-24 – 2023-10-25 (×3): 50 mL via INTRAVENOUS
  Filled 2023-10-24 (×3): qty 50

## 2023-10-24 MED ORDER — DEXTROSE 50 % IV SOLN
INTRAVENOUS | Status: AC
  Administered 2023-10-24: 50 mL via INTRAVENOUS
  Filled 2023-10-24: qty 50

## 2023-10-24 MED ORDER — AMIODARONE HCL 200 MG PO TABS
100.0000 mg | ORAL_TABLET | Freq: Every day | ORAL | Status: DC
  Filled 2023-10-24: qty 1

## 2023-10-24 MED ORDER — FAMOTIDINE IN NACL 20-0.9 MG/50ML-% IV SOLN
20.0000 mg | Freq: Once | INTRAVENOUS | Status: AC
  Administered 2023-10-24: 20 mg via INTRAVENOUS
  Filled 2023-10-24: qty 50

## 2023-10-24 MED ORDER — SODIUM CHLORIDE 0.9 % IV SOLN
500.0000 mg | INTRAVENOUS | Status: DC
  Administered 2023-10-24: 500 mg via INTRAVENOUS
  Filled 2023-10-24: qty 5

## 2023-10-24 MED ORDER — HYDRALAZINE HCL 20 MG/ML IJ SOLN
5.0000 mg | Freq: Four times a day (QID) | INTRAMUSCULAR | Status: DC | PRN
Start: 2023-10-24 — End: 2023-10-29

## 2023-10-24 MED ORDER — ONDANSETRON HCL 4 MG/2ML IJ SOLN
4.0000 mg | Freq: Once | INTRAMUSCULAR | Status: AC
Start: 2023-10-24 — End: 2023-10-24
  Administered 2023-10-24: 4 mg via INTRAVENOUS
  Filled 2023-10-24: qty 2

## 2023-10-24 NOTE — Assessment & Plan Note (Addendum)
Suspect secondary to postop in setting of spinal stenosis surgery on 10/18/23 Symptomatic support: Morphine 2 mg IV every 4 hours as needed for moderate pain, 20 hours ordered; fentanyl 50 mcg IV every 4 hours as needed for severe pain, 20 hours of coverage ordered Status post sodium chloride 2 L bolus per EDP Continue with LR infusion at 100 mL/h, 1 day initially ordered and the discontinued due to hypoglycemia Changed IVF to D5 NS 75 ml/hr, 1 day ordered Admit to telemetry medical, inpatient

## 2023-10-24 NOTE — Assessment & Plan Note (Signed)
Home Sutton, Farxiga, rosuvastatin were not resumed on admission due to n.p.o. state in setting of ileus and intractable nausea and vomiting

## 2023-10-24 NOTE — Assessment & Plan Note (Signed)
Suspect secondary to dehydration in setting of ileus Recheck liver enzymes in the a.m.

## 2023-10-24 NOTE — Assessment & Plan Note (Signed)
Continue outpatient follow-up with neurosurgery and physical medicine as appropriate

## 2023-10-24 NOTE — Assessment & Plan Note (Addendum)
Home metformin, Glimepiride, Marcelline Deist will not be resumed on admission Insulin SSI with at bedtime coverage ordered Goal inpatient blood glucose levels 140-180

## 2023-10-24 NOTE — Assessment & Plan Note (Signed)
With elevated procalcitonin Will initiate azithromycin 500 mg IV daily and ceftriaxone 2 g IV daily, to complete 5-day course Blood cultures x 2 are in process Check MRSA PCR, if positive will add vancomycin per pharmacy Recheck CBC in the a.m.

## 2023-10-24 NOTE — Assessment & Plan Note (Addendum)
Home Entresto 49-51 mg tablet twice daily, torsemide 20 mg daily were not resumed on admission due to patient being n.p.o. and acute kidney injury Home metoprolol succinate 100 mg every morning not resumed due to low blood pressure to low normotensive on admission Hydralazine 5 mg IV every 6 hours as needed for SBP greater than 175, 5 days ordered

## 2023-10-24 NOTE — ED Triage Notes (Signed)
Pt presents to ER with c/o weakness that has been ongoing for last few days.  Pt states he recently had surgery for spinal stenosis on 12/23 and was released the next day.  Pt reports feeling like he was let go from the hospital too soon.  Pt also states that tonight around midnight, that he started to have some nausea and vomiting.  Pt endorses having a decreased appetite since his surgery.  Pt is otherwise A&O x4 and in NAD at this time.

## 2023-10-24 NOTE — Assessment & Plan Note (Signed)
On baseline CKD stage II/IIIa Suspect prerenal in setting of decreased p.o. intake and GI loss in setting of intractable nausea and vomiting secondary to ileus Treat per above Recheck BMP in the a.m.

## 2023-10-24 NOTE — H&P (Addendum)
Addendum: Patient was started on D5NS at 31ml/hr and given D50 at 63 and 1939. CBG at 18:56 was 51.  Check urgent phosphorus level, if normal patient can be give IM glucagon D5NS 44ml/hr changed to 100 ml/hr. D50 amp prn for hypoglycemia also ordered Cross coverage provider made aware via secure chat    History and Physical   Jason [name] YNW:[id number] DOB: [birth year] DOA: 10/24/2023  PCP: Ailene Ravel, MD  Outpatient Specialists: Dr. Lovell Sheehan, neurosurgery Patient coming from: home  I have personally briefly reviewed patient's old medical records in [facility] EMR.  Chief Concern: Nausea, vomiting  HPI: Jason [name] is a 72 year old male with history of CAD status post four-vessel [name] in 2017, hypertension, hyperlipidemia, non-insulin-dependent diabetes mellitus, history of lumbar stenosis status post spinal stenosis surgery on 10/18/2023, who presents emergency department for chief concerns of generalized weakness since being discharged post spinal surgery, and intractable nausea and vomiting that started last evening.  Vitals in the ED showed temperature 98, respiration rate 18, heart rate of 103, blood pressure 118/80, SpO2 of 95% on room air.  Serum sodium is 130, potassium 4.4, chloride 93, bicarb 13, BUN of 83, serum creatinine of 3.81, eGFR 16, nonfasting blood glucose 166, WBC 11.1, hemoglobin 13.9, platelet of 244.  High since he troponin is 24.  Lactic acid is 8.8.  AST/ALT was 84/118 respectively.  Alk phos is 57.  T. bili is 1.9.  ED treatment: Morphine 4 mg IV x 2 doses, sodium chloride 2 L boluses total were given.  EDP also ordered NG tube placement and nursing management. ------------------------------ At bedside, patient able to tell me his name, age, location, current calendar year.  Reports he has been vomiting since last evening.  Since his back surgery, he has not been able to tolerate p.o. intake and has had no appetite.  He endorses that he still  making urine however his urine has been pretty dark lately. He denies trauma to his person.  At bedside he denies chest pain, shortness of breath.  He denies dysuria, hematuria, diarrhea.  He reports over the last 1.5 weeks, he has had very minimal bowel movements.  In the minimal amount of BM that he has been able to produce has been pretty firm.  Social history: Is at home with his wife.  He denies smoking tobacco, EtOH, recreational drug use.  He endorses chewing of tobacco products regularly.  He is semiretired.  ROS: Constitutional: no weight change, no fever ENT/Mouth: no sore throat, no rhinorrhea Eyes: no eye pain, no vision changes Cardiovascular: no chest pain, no dyspnea,  no edema, no palpitations Respiratory: no cough, no sputum, no wheezing Gastrointestinal: + nausea, + vomiting, no diarrhea, no constipation Genitourinary: no urinary incontinence, no dysuria, no hematuria Musculoskeletal: no arthralgias, no myalgias Skin: no skin lesions, no pruritus, Neuro: + weakness, no loss of consciousness, no syncope Psych: no anxiety, no depression, + decrease appetite Heme/Lymph: no bruising, no bleeding  ED Course: Discussed with emergency medicine provider, patient requiring hospitalization for chief concerns of ileus and intractable nausea and vomiting.  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 (HCC)   Type II diabetes mellitus (HCC)   Essential hypertension   Mixed hyperlipidemia   AKI (acute kidney injury) (HCC)   Leukocytosis   Hypoglycemia   Elevated liver enzymes   S/P [name]   Spinal stenosis of lumbar  region with neurogenic claudication   Assessment and Plan:  * Ileus (HCC) Suspect secondary to postop in setting of spinal stenosis surgery on 10/18/23 Symptomatic support: Morphine 2 mg IV every 4 hours as needed for moderate pain, 20 hours  ordered; fentanyl 50 mcg IV every 4 hours as needed for severe pain, 20 hours of coverage ordered Status post sodium chloride 2 L bolus per EDP Continue with LR infusion at 100 mL/h, 1 day initially ordered and the discontinued due to hypoglycemia Changed IVF to D5 NS 75 ml/hr, 1 day ordered Admit to telemetry medical, inpatient  Hypoglycemia In setting of poor p.o. intake with ileus on presentation Status post D50 1 amp per EDP one-time dose Will order D50 as needed for low blood sugar, 5 days D5 normal saline infusion at 75 mL/h, 1 day ordered on admission  Leukocytosis With elevated procalcitonin Will initiate azithromycin 500 mg IV daily and ceftriaxone 2 g IV daily, to complete 5-day course Blood cultures x 2 are in process Check MRSA PCR, if positive will add vancomycin per pharmacy Recheck CBC in the a.m.  AKI (acute kidney injury) (HCC) On baseline CKD stage II/IIIa Suspect prerenal in setting of decreased p.o. intake and GI loss in setting of intractable nausea and vomiting secondary to ileus Treat per above Recheck BMP in the a.m.  Mixed hyperlipidemia Rosuvastatin not resumed on admission due to n.p.o. state and elevated liver enzymes  Essential hypertension Home Entresto 49-51 mg tablet twice daily, torsemide 20 mg daily were not resumed on admission due to patient being n.p.o. and acute kidney injury Home metoprolol succinate 100 mg every morning not resumed due to low blood pressure to low normotensive on admission Hydralazine 5 mg IV every 6 hours as needed for SBP greater than 175, 5 days ordered  Type II diabetes mellitus (HCC) Home metformin, Glimepiride, Marcelline Deist will not be resumed on admission Insulin SSI with at bedtime coverage ordered Goal inpatient blood glucose levels 140-180  Elevated liver enzymes Suspect secondary to dehydration in setting of ileus Recheck liver enzymes in the a.m.  Spinal stenosis of lumbar region with neurogenic  claudication Continue outpatient follow-up with neurosurgery and physical medicine as appropriate  S/P [name] Home Entresto, Farxiga, rosuvastatin were not resumed on admission due to n.p.o. state in setting of ileus and intractable nausea and vomiting  Chart reviewed.   DVT prophylaxis: Heparin 5000 units subcutaneous every 8 hours Code Status: full code Diet: N.p.o. Family Communication: updated spouse at bedside and neighborhood friend at bedside Disposition Plan: Pending clinical course Consults called: None at this time; conservative management at this time as given there is no read of overt SBO.  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 Barry Dienes   Cough    CHRONIC   Diabetes mellitus without complication (HCC)    Edema    FEET/LEGS   Essential hypertension    Hypertension    Hypothyroidism    Left main coronary artery disease 04/13/2016   Mixed hyperlipidemia    Obesity    PAF (paroxysmal atrial fibrillation) (HCC)    S/P [name]  x 4 04/14/2016   LIMA to LAD, SVG to D1, SVG to OM, SVG to PDA, EVH via right thigh and leg   Type II diabetes mellitus (HCC)    Unstable angina (HCC) 04/13/2016   Past Surgical History:  Procedure Laterality Date   BACK SURGERY     Lumbar Surgeries. [facility] [place] [place]. Dr. Elesa Hacker x2   CARDIAC CATHETERIZATION Left 04/13/2016   Procedure: Left Heart Cath and Coronary Angiography;  Surgeon: [name], MD;  Location: [facility] INVASIVE CV LAB;  Service: Cardiovascular;  Laterality: Left;   CARDIOVERSION N/A 08/02/2018    Procedure: CARDIOVERSION;  Surgeon: [name], MD;  Location: [facility] ORS;  Service: Cardiovascular;  Laterality: N/A;   CATARACT EXTRACTION W/PHACO Right 01/23/2016   Procedure: CATARACT EXTRACTION PHACO AND INTRAOCULAR LENS PLACEMENT (IOC);  Surgeon: Galen Manila, MD;  Location: [facility] ORS;  Service: Ophthalmology;  Laterality: Right;  [place] 01:18 AP% 23.8 CDE 18.67 fluid pack lot # [id number]   CHOLECYSTECTOMY     CORONARY ARTERY BYPASS GRAFT N/A 04/14/2016   Procedure: CORONARY ARTERY BYPASS GRAFTING TIMES FOUR    USING LEFT INTERNAL MAMMARY AND ENDOSCOPIC HARVEST RIGHT SAPHENOUS VEIN;  Surgeon: Purcell Nails, MD;  Location: [facility] OR;  Service: Open Heart Surgery;  Laterality: N/A;   EYE SURGERY Bilateral    Cataract Extraction with IOL implant.   INTRAOPERATIVE TRANSESOPHAGEAL ECHOCARDIOGRAM N/A 04/14/2016   Procedure: INTRAOPERATIVE TRANSESOPHAGEAL ECHOCARDIOGRAM;  Surgeon: Purcell Nails, MD;  Location: [facility] OR;  Service: Open Heart Surgery;  Laterality: N/A;   KNEE ARTHROSCOPY Left 10/08/2015   Procedure: ARTHROSCOPY KNEE, PARTIAL MEDIAL AND LATERAL MENISECTOMY, REMOVAL OF FOREIGN BODY;  Surgeon: Kennedy Bucker, MD;  Location: [facility] ORS;  Service: Orthopedics;  Laterality: Left;   LUMBAR LAMINECTOMY/DECOMPRESSION MICRODISCECTOMY Bilateral 10/18/2023   Procedure: Lumbar Three-Four, Lumbar Four-Five  Laminotomy, Foraminotomy;  Surgeon: Tressie Stalker, MD;  Location: [facility] OR;  Service: Neurosurgery;  Laterality: Bilateral;  3C   Social History:  reports that he has never smoked. His smokeless tobacco use includes chew. He reports that he does not drink alcohol and does not use drugs.  Allergies  Allergen Reactions   Aspirin Anaphylaxis   Inderal [Propranolol]     Other reaction(s): Other (See Comments) Fatigue   Metformin Hcl Diarrhea    Pt still takes medication   Mevacor [Lovastatin] Other (See Comments)    muscle Pain   Penicillin G Hives   Pravachol [Pravastatin Sodium] Other  (See Comments)    Muscle Pain   Zocor [Simvastatin] Other (See Comments)    Muscle Pain   Penicillins Rash    Has patient had a PCN reaction causing immediate rash, facial/tongue/throat swelling, SOB or lightheadedness with hypotension: Yes Has patient had a PCN reaction causing severe rash involving mucus membranes or skin necrosis: Yes Has patient had a PCN reaction that required hospitalization No Has patient had a PCN reaction occurring within the last 10 years: Yes If all of the above answers are "NO", then may proceed with Cephalosporin use.    Family History  Problem Relation Age of Onset   Hypertension Father    Aortic stenosis Father    Heart disease Father    Family history: Family history reviewed and not pertinent.  Prior to Admission medications   Medication Sig Start Date End Date Taking? Authorizing Provider  acetaminophen (TYLENOL) 500 MG tablet Take 500-1,000 mg by mouth every 6 (six) hours as needed (pain.).    [provider]  amiodarone (PACERONE) 200 MG  tablet Take 100 mg by mouth in the morning.    [provider]  apixaban (ELIQUIS) 5 MG TABS tablet Take 1 tablet (5 mg total) by mouth 2 (two) times daily. 10/25/23   Patrici Ranks Caylin, PA-C  cyclobenzaprine (FLEXERIL) 10 MG tablet Take 1 tablet (10 mg total) by mouth 3 (three) times daily as needed for muscle spasms. 10/19/23   Patrici Ranks Caylin, PA-C  dapagliflozin propanediol (FARXIGA) 10 MG TABS tablet Take 10 mg by mouth in the morning.    [provider]  docusate sodium (COLACE) 100 MG capsule Take 1 capsule (100 mg total) by mouth 2 (two) times daily. 10/19/23 10/18/24  Patrici Ranks Caylin, PA-C  gabapentin (NEURONTIN) 100 MG capsule Take 100 mg by mouth at bedtime as needed (nerve pain.). 06/30/23   [provider]  glimepiride (AMARYL) 4 MG tablet Take 4 mg by mouth 2 (two) times daily. Reported on 04/13/2016    [provider]  levothyroxine  (SYNTHROID) 50 MCG tablet Take 50 mcg by mouth daily at 2 am. 08/08/23   [provider]  metFORMIN (GLUCOPHAGE) 1000 MG tablet Take 1,000 mg by mouth in the morning and at bedtime.    [provider]  metoprolol succinate (TOPROL-XL) 100 MG 24 hr tablet Take 100 mg by mouth in the morning. Take with or immediately following a meal.    [provider]  Oxycodone HCl 10 MG TABS Take 1 tablet (10 mg total) by mouth every 4 (four) hours as needed for severe pain (pain score 7-10). 10/19/23   Patrici Ranks Caylin, PA-C  rosuvastatin (CRESTOR) 40 MG tablet Take 20 mg by mouth at bedtime. 07/01/23   [provider]  sacubitril-valsartan (ENTRESTO) 49-51 MG Take 1 tablet by mouth 2 (two) times daily.    [provider]  torsemide (DEMADEX) 20 MG tablet Take 20 mg by mouth in the morning. 11/23/22   [provider]   Physical Exam: Vitals:   10/24/23 1159 10/24/23 1525 10/24/23 1530 10/24/23 1621  BP: 118/80 (!) 68/39 (!) 111/91   Pulse: (!) 103 (!) 107 (!) 107   Resp: 18 (!) 28 17   Temp: 98 F (36.7 C)   98.6 F (37 C)  TempSrc: Oral   Oral  SpO2: 95% 93% 93%   Weight:      Height:       Constitutional: appears frail, acutely ill Eyes: PERRL, lids and conjunctivae normal ENMT: Mucous membranes are moist. Posterior pharynx clear of any exudate or lesions. Poor dentition. Hearing appropriate Neck: normal, supple, no masses, no thyromegaly Respiratory: clear to auscultation bilaterally, no wheezing, no crackles. Normal respiratory effort. No accessory muscle use.  Cardiovascular: Regular rate and rhythm, no murmurs / rubs / gallops. No extremity edema. 2+ pedal pulses. No carotid bruits.  Abdomen: Obese abdomen, + tenderness, no masses palpated, no hepatosplenomegaly. Bowel sounds decreased.  Musculoskeletal: no clubbing / cyanosis. No joint deformity upper and lower extremities. Good ROM, no contractures, no atrophy. Normal muscle tone.   Skin: no rashes, lesions, ulcers. No induration Neurologic: Sensation intact. Strength 5/5 in all 4.  Psychiatric: Normal judgment and insight. Alert and oriented x 3. Normal mood.   EKG: independently reviewed, showing sinus tachycardia with rate of 109, QTc 498  Chest x-ray on Admission: I personally reviewed and I agree with radiologist reading as below.  DG Abd 1 View Result Date: 10/24/2023 CLINICAL DATA:  NGT placement EXAM: ABDOMEN - 1 VIEW COMPARISON:  None Available.  FINDINGS: Limited study that includes the upper abdomen and lower chest demonstrates an NG tube superimposed with the stomach which is mildly distended. There is gaseous distention of small and large bowel. IMPRESSION: NG tube in the stomach. Gaseous distention of bowel. Electronically Signed   By: Layla Maw M.D.   On: 10/24/2023 16:07   DG Chest Portable 1 View Result Date: 10/24/2023 CLINICAL DATA:  Weakness, vomiting EXAM: PORTABLE CHEST 1 VIEW COMPARISON:  09/07/2023 FINDINGS: Left-sided implanted cardiac device remains in place. 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 Minor left basilar atelectasis. Lungs are otherwise clear. No pleural effusion or pneumothorax. Gaseous distension of bowel within the included upper abdomen. IMPRESSION: 1. Minor left basilar atelectasis. Otherwise no acute cardiopulmonary findings. 2. Gaseous distension of bowel within the included upper abdomen. Electronically Signed   By: Duanne Guess D.O.   On: 10/24/2023 12:38   CT ABDOMEN PELVIS WO CONTRAST Result Date: 10/24/2023 CLINICAL DATA:  72 year old male with abdominal pain and weakness. Recent spine surgery. Nausea vomiting. EXAM: CT ABDOMEN AND PELVIS WITHOUT CONTRAST TECHNIQUE: Multidetector CT imaging of the abdomen and pelvis was performed following the standard protocol without IV contrast. RADIATION DOSE REDUCTION: This exam was performed according to the departmental dose-optimization program which includes  automated exposure control, adjustment of the mA and/or kV according to patient size and/or use of iterative reconstruction technique. COMPARISON:  Intraoperative lumbar radiographs 10/18/2023. Lumbar spine CT 05/12/2023. FINDINGS: Lower chest: Previous sternotomy. Cardiac pacer leads. No cardiomegaly or pericardial effusion. Elevated left hemidiaphragm, confluent left lung base atelectasis. Linear, patchy right lung base atelectasis. No pericardial effusion. Hepatobiliary: Cholecystectomy. Negative noncontrast liver with punctate calcified granulomas. Pancreas: Negative noncontrast pancreas. Spleen: Negative. Adrenals/Urinary Tract: Normal adrenal glands. Nonobstructed kidneys. Simple fluid renal cysts (no follow-up imaging recommended). Decompressed ureters. Decompressed bladder. Pelvic phleboliths. Stomach/Bowel: Low-density retained stool throughout the distal colon, dilated gas-filled right colon and transverse colon. Mild retained fluid in the right colon. Appendix remains normal on series 5, image 51. Redundant right colon with gas-filled cecum up to 9 cm diameter. Transverse colon is 5.5 cm diameter and the splenic flexure tapers with no transition point or obstructing etiology. Descending and sigmoid colon mostly decompressed. Terminal ileum is relatively decompressed on series 2, image 40. But just upstream of the TI these distal small bowel becomes mildly dilated and gas-filled up to 3.8 cm. Farther upstream gas and fluid distended small bowel loops are 4.5 cm. The ligament of Treitz is dilated up to 5 cm. The stomach and proximal duodenum are dilated. The distal esophagus is decompressed. The distal duodenum is decompressed. Fat containing umbilical hernia. No herniated bowel. No free air or free fluid. Vascular/Lymphatic: Extensive Aortoiliac calcified atherosclerosis. Normal caliber abdominal aorta. Vascular patency is not evaluated in the absence of IV contrast. No lymphadenopathy. Reproductive:  Negative. Other: No pelvis free fluid. Musculoskeletal: Recent postoperative changes to the posterior lumbar spine. Underlying chronic lumbar spine degeneration. No unexpected features. IMPRESSION: 1. Dilated stomach, small bowel, and proximal colon with tapered bowel at the splenic flexure, no transition point or obstructing etiology. Favor generalized Ileus. NG tube decompression may be valuable. 2. Normal appendix.  No free air or free fluid. 3. Recent postoperative changes to the lumbar spine. 4. Lung base atelectasis. 5.  Aortic Atherosclerosis (ICD10-I70.0). Electronically Signed   By: Odessa Fleming M.D.   On: 10/24/2023 12:25   Labs on Admission: I have personally reviewed following labs  CBC: Recent Labs  Lab 10/24/23 0700  WBC 11.1*  HGB 13.9  HCT 40.5  MCV 90.4  PLT 244   Basic Metabolic Panel: Recent Labs  Lab 10/24/23 0700  NA 130*  K 4.4  CL 93*  CO2 13*  GLUCOSE 166*  BUN 89*  CREATININE 3.81*  CALCIUM 9.5   GFR: Estimated Creatinine Clearance: 23.8 mL/min (A) (by C-G formula based on SCr of 3.81 mg/dL (H)).  Liver Function Tests: Recent Labs  Lab 10/24/23 0700  AST 84*  ALT 118*  ALKPHOS 57  BILITOT 1.9*  PROT 7.2  ALBUMIN 3.6   CBG: Recent Labs  Lab 10/19/23 0619 10/24/23 0656 10/24/23 1623 10/24/23 1632 10/24/23 1642  GLUCAP 118* 169* <10* 12* 75   Urine analysis:    Component Value Date/Time   COLORURINE YELLOW 04/13/2016 2139   APPEARANCEUR CLEAR 04/13/2016 2139   LABSPEC 1.015 04/13/2016 2139   PHURINE 5.5 04/13/2016 2139   GLUCOSEU >1000 (A) 04/13/2016 2139   HGBUR NEGATIVE 04/13/2016 2139   BILIRUBINUR NEGATIVE 04/13/2016 2139   KETONESUR NEGATIVE 04/13/2016 2139   PROTEINUR NEGATIVE 04/13/2016 2139   NITRITE NEGATIVE 04/13/2016 2139   LEUKOCYTESUR NEGATIVE 04/13/2016 2139   CRITICAL CARE Performed by: Dr. Sedalia Muta  Total critical care time: 32 minutes  Critical care time was exclusive of separately billable procedures and treating  other patients.  Critical care was necessary to treat or prevent imminent or life-threatening deterioration.  Critical care was time spent personally by me on the following activities: development of treatment plan with patient and spouse, as well as nursing, discussions with consultants, evaluation of patient's response to treatment, examination of patient, obtaining history from patient or surrogate, ordering and performing treatments and interventions, ordering and review of laboratory studies, ordering and review of radiographic studies, pulse oximetry and re-evaluation of patient's condition.  This document was prepared using Dragon Voice Recognition software and may include unintentional dictation errors.  Dr. Sedalia Muta [facility]  If 7PM-7AM, please contact overnight-coverage provider If 7AM-7PM, please contact day attending provider [email or web address]  10/24/2023, 5:16 PM

## 2023-10-24 NOTE — ED Notes (Signed)
16/f [place] Sump inserted in left nare without diff  Connected to low suction  Pt tolerated well  Also repositioned on right side   States he has a small to buttock area

## 2023-10-24 NOTE — Hospital Course (Addendum)
[name] is a 72 year old male with history of CAD status post four-vessel [name] in 2017, hypertension, hyperlipidemia, non-insulin-dependent diabetes mellitus, history of lumbar stenosis status post spinal stenosis surgery on 10/18/2023, who presents emergency department for chief concerns of generalized weakness since being discharged post spinal surgery, and intractable nausea and vomiting that started last evening.  Vitals in the ED showed temperature 98, respiration rate 18, heart rate of 103, blood pressure 118/80, SpO2 of 95% on room air.  Serum sodium is 130, potassium 4.4, chloride 93, bicarb 13, BUN of 83, serum creatinine of 3.81, eGFR 16, nonfasting blood glucose 166, WBC 11.1, hemoglobin 13.9, platelet of 244.  High since he troponin is 24.  Lactic acid is 8.8.  AST/ALT was 84/118 respectively.  Alk phos is 57.  T. bili is 1.9.  ED treatment: Morphine 4 mg IV x 2 doses, sodium chloride 2 L boluses total were given.  EDP also ordered NG tube placement and nursing management.

## 2023-10-24 NOTE — Assessment & Plan Note (Signed)
In setting of poor p.o. intake with ileus on presentation Status post D50 1 amp per EDP one-time dose Will order D50 as needed for low blood sugar, 5 days D5 normal saline infusion at 75 mL/h, 1 day ordered on admission

## 2023-10-24 NOTE — Assessment & Plan Note (Signed)
Rosuvastatin not resumed on admission due to n.p.o. state and elevated liver enzymes

## 2023-10-24 NOTE — ED Provider Notes (Signed)
[facility] Provider Note    Event Date/Time   First MD Initiated Contact with Patient 10/24/23 1140     (approximate)   History   Emesis and Weakness   HPI  Jason [name] is a 72 y.o. male  with h/o CAD, CHF, [name], DM, HLD, HTN, here with emesis and weakness. Pt was just seen and admitted 12/23-12/24 for b/l L3-5 laminotomy/foraminotomies. He says he was still having pain when sent home. He states that since then, he has been having worsening aching, throbbing back pain. He has had poor PO intake. He has not had a BM since the surgery as well.  Over the last 24 hours, has developed grossly worsening nausea, vomiting, and general fatigue.  He has had difficulty even walking due to the fatigue.  Denies any fevers.  He has had normal urine output.      Physical Exam   Triage Vital Signs: ED Triage Vitals  Encounter Vitals Group     BP 10/24/23 0651 114/84     Systolic BP Percentile --      Diastolic BP Percentile --      Pulse Rate 10/24/23 0651 (!) 105     Resp 10/24/23 0651 20     Temp 10/24/23 0651 97.8 F (36.6 C)     Temp Source 10/24/23 0651 Axillary     SpO2 10/24/23 0651 95 %     Weight --      Height --      Head Circumference --      Peak Flow --      Pain Score 10/24/23 0652 9     Pain Loc --      Pain Education --      Exclude from Growth Chart --     Most recent vital signs: Vitals:   10/25/23 2100 10/25/23 2200  BP:    Pulse: (!) 104 (!) 104  Resp: (!) 24 (!) 23  Temp:    SpO2: 97% 95%     General: Awake, no distress.  CV:  Good peripheral perfusion.  Mild tachycardia noted.  No murmurs. Resp:  Normal work of breathing.  Lungs clear to auscultation bilaterally. Abd:  Moderate tenderness, hypoactive bowel sounds.  Diffuse tenderness with slight guarding.  No rebound. Other:  Markedly dry mucous membranes.  Strength and sensation 5 out of 5 and full sensation light touch in bilateral lower extremities.  Operative site  clean, dry, intact.   ED Results / Procedures / Treatments   Labs (all labs ordered are listed, but only abnormal results are displayed) Labs Reviewed  CBC - Abnormal; Notable for the following components:      Result Value   WBC 11.1 (*)    All other components within normal limits  COMPREHENSIVE METABOLIC PANEL - Abnormal; Notable for the following components:   Sodium 130 (*)    Chloride 93 (*)    CO2 13 (*)    Glucose, Bld 166 (*)    BUN 89 (*)    Creatinine, Ser 3.81 (*)    AST 84 (*)    ALT 118 (*)    Total Bilirubin 1.9 (*)    GFR, Estimated 16 (*)    Anion gap 24 (*)    All other components within normal limits  URINALYSIS, ROUTINE W REFLEX MICROSCOPIC - Abnormal; Notable for the following components:   Color, Urine AMBER (*)    APPearance CLOUDY (*)    Glucose, UA 150 (*)  Hgb urine dipstick SMALL (*)    Protein, ur 30 (*)    All other components within normal limits  LACTIC ACID, PLASMA - Abnormal; Notable for the following components:   Lactic Acid, Venous 8.8 (*)    All other components within normal limits  LACTIC ACID, PLASMA - Abnormal; Notable for the following components:   Lactic Acid, Venous 8.4 (*)    All other components within normal limits  LACTIC ACID, PLASMA - Abnormal; Notable for the following components:   Lactic Acid, Venous 6.1 (*)    All other components within normal limits  HEPATIC FUNCTION PANEL - Abnormal; Notable for the following components:   Total Protein 5.3 (*)    Albumin 2.7 (*)    AST 227 (*)    ALT 147 (*)    All other components within normal limits  GLUCOSE, CAPILLARY - Abnormal; Notable for the following components:   Glucose-Capillary 20 (*)    All other components within normal limits  GLUCOSE, CAPILLARY - Abnormal; Notable for the following components:   Glucose-Capillary 27 (*)    All other components within normal limits  GLUCOSE, CAPILLARY - Abnormal; Notable for the following components:   Glucose-Capillary  <10 (*)    All other components within normal limits  GLUCOSE, CAPILLARY - Abnormal; Notable for the following components:   Glucose-Capillary 121 (*)    All other components within normal limits  GLUCOSE, CAPILLARY - Abnormal; Notable for the following components:   Glucose-Capillary 66 (*)    All other components within normal limits  GLUCOSE, CAPILLARY - Abnormal; Notable for the following components:   Glucose-Capillary 122 (*)    All other components within normal limits  GLUCOSE, CAPILLARY - Abnormal; Notable for the following components:   Glucose-Capillary 126 (*)    All other components within normal limits  GLUCOSE, CAPILLARY - Abnormal; Notable for the following components:   Glucose-Capillary 58 (*)    All other components within normal limits  GLUCOSE, CAPILLARY - Abnormal; Notable for the following components:   Glucose-Capillary 100 (*)    All other components within normal limits  LACTIC ACID, PLASMA - Abnormal; Notable for the following components:   Lactic Acid, Venous 4.2 (*)    All other components within normal limits  BASIC METABOLIC PANEL - Abnormal; Notable for the following components:   Sodium 130 (*)    CO2 18 (*)    BUN 136 (*)    Creatinine, Ser 4.31 (*)    Calcium 7.7 (*)    GFR, Estimated 14 (*)    All other components within normal limits  CBC - Abnormal; Notable for the following components:   RBC 3.80 (*)    Hemoglobin 11.8 (*)    HCT 33.1 (*)    Platelets 143 (*)    All other components within normal limits  BLOOD GAS, ARTERIAL - Abnormal; Notable for the following components:   pCO2 arterial 27 (*)    pO2, Arterial 75 (*)    Bicarbonate 14.9 (*)    Acid-base deficit 9.1 (*)    All other components within normal limits  GLUCOSE, CAPILLARY - Abnormal; Notable for the following components:   Glucose-Capillary 59 (*)    All other components within normal limits  BRAIN NATRIURETIC PEPTIDE - Abnormal; Notable for the following components:    B Natriuretic Peptide 463.1 (*)    All other components within normal limits  GLUCOSE, CAPILLARY - Abnormal; Notable for the following components:   Glucose-Capillary 101 (*)  All other components within normal limits  LACTIC ACID, PLASMA - Abnormal; Notable for the following components:   Lactic Acid, Venous 3.1 (*)    All other components within normal limits  BASIC METABOLIC PANEL - Abnormal; Notable for the following components:   Sodium 132 (*)    CO2 16 (*)    Glucose, Bld 189 (*)    BUN 98 (*)    Creatinine, Ser 3.83 (*)    Calcium 7.9 (*)    GFR, Estimated 16 (*)    All other components within normal limits  GLUCOSE, CAPILLARY - Abnormal; Notable for the following components:   Glucose-Capillary 278 (*)    All other components within normal limits  GLUCOSE, CAPILLARY - Abnormal; Notable for the following components:   Glucose-Capillary 166 (*)    All other components within normal limits  GLUCOSE, CAPILLARY - Abnormal; Notable for the following components:   Glucose-Capillary 183 (*)    All other components within normal limits  GLUCOSE, CAPILLARY - Abnormal; Notable for the following components:   Glucose-Capillary 181 (*)    All other components within normal limits  GLUCOSE, CAPILLARY - Abnormal; Notable for the following components:   Glucose-Capillary 209 (*)    All other components within normal limits  CBG MONITORING, ED - Abnormal; Notable for the following components:   Glucose-Capillary 169 (*)    All other components within normal limits  CBG MONITORING, ED - Abnormal; Notable for the following components:   Glucose-Capillary <10 (*)    All other components within normal limits  CBG MONITORING, ED - Abnormal; Notable for the following components:   Glucose-Capillary 12 (*)    All other components within normal limits  CBG MONITORING, ED - Abnormal; Notable for the following components:   Glucose-Capillary 48 (*)    All other components within normal  limits  CBG MONITORING, ED - Abnormal; Notable for the following components:   Glucose-Capillary 43 (*)    All other components within normal limits  CBG MONITORING, ED - Abnormal; Notable for the following components:   Glucose-Capillary 51 (*)    All other components within normal limits  TROPONIN I (HIGH SENSITIVITY) - Abnormal; Notable for the following components:   Troponin I (High Sensitivity) 24 (*)    All other components within normal limits  TROPONIN I (HIGH SENSITIVITY) - Abnormal; Notable for the following components:   Troponin I (High Sensitivity) 30 (*)    All other components within normal limits  TROPONIN I (HIGH SENSITIVITY) - Abnormal; Notable for the following components:   Troponin I (High Sensitivity) 60 (*)    All other components within normal limits  CULTURE, BLOOD (ROUTINE X 2)  CULTURE, BLOOD (ROUTINE X 2)  MRSA NEXT GEN BY PCR, NASAL  SARS CORONAVIRUS 2 BY RT PCR  RESPIRATORY PANEL BY PCR  PROCALCITONIN  PROCALCITONIN  PHOSPHORUS  GLUCOSE, CAPILLARY  TSH  T4, FREE  GLUCOSE, CAPILLARY  GLUCOSE, CAPILLARY  URINALYSIS, COMPLETE (UACMP) WITH MICROSCOPIC  LACTIC ACID, PLASMA  CBC  COMPREHENSIVE METABOLIC PANEL  MAGNESIUM  PHOSPHORUS  PROCALCITONIN  CBG MONITORING, ED  CBG MONITORING, ED  CBG MONITORING, ED     EKG Sinus tachycardia, ventricular at 109.  PR 140, QRS 138, QTc 498.  No acute ST elevations or depressions.  No acute evidence of acute ischemia or infarct.  Nonspecific T wave changes.   RADIOLOGY Chest x-ray: Minimal atelectasis on L, no PNA CT A/P: Dilated small bowl, proximal colon, dilated stomach, c/w generalized  ileus, no perforation  I also independently reviewed and agree with radiologist interpretations.   PROCEDURES:  Critical Care performed: Yes, see critical care procedure note(s)  .Critical Care  Performed by: Shaune Pollack, MD Authorized by: Shaune Pollack, MD   Critical care provider statement:     Critical care time (minutes):  30   Critical care time was exclusive of:  Separately billable procedures and treating other patients   Critical care was necessary to treat or prevent imminent or life-threatening deterioration of the following conditions:  Cardiac failure, circulatory failure and respiratory failure   Critical care was time spent personally by me on the following activities:  Development of treatment plan with patient or surrogate, discussions with consultants, evaluation of patient's response to treatment, examination of patient, ordering and review of laboratory studies, ordering and review of radiographic studies, ordering and performing treatments and interventions, pulse oximetry, re-evaluation of patient's condition and review of old charts     MEDICATIONS ORDERED IN ED: Medications  levothyroxine (SYNTHROID) tablet 50 mcg (50 mcg Oral Not Given 10/25/23 0617)  cyclobenzaprine (FLEXERIL) tablet 10 mg (has no administration in time range)  acetaminophen (TYLENOL) tablet 650 mg (has no administration in time range)    Or  acetaminophen (TYLENOL) suppository 650 mg (has no administration in time range)  ondansetron (ZOFRAN) tablet 4 mg (has no administration in time range)    Or  ondansetron (ZOFRAN) injection 4 mg (has no administration in time range)  heparin injection 5,000 Units (5,000 Units Subcutaneous Given 10/25/23 2110)  insulin aspart (novoLOG) injection 0-9 Units (2 Units Subcutaneous Given 10/25/23 1653)  insulin aspart (novoLOG) injection 0-5 Units (2 Units Subcutaneous Given 10/25/23 2213)  hydrALAZINE (APRESOLINE) injection 5 mg (has no administration in time range)  morphine (PF) 2 MG/ML injection 2 mg (has no administration in time range)  fentaNYL (SUBLIMAZE) injection 50 mcg (has no administration in time range)  dextrose 50 % solution 50 mL (50 mLs Intravenous Given 10/25/23 0100)  glucagon (human recombinant) (GLUCAGEN) injection 1 mg (1 mg Intravenous  Not Given 10/25/23 0200)  0.9 %  sodium chloride infusion ( Intravenous Infusion Verify 10/25/23 1900)  Chlorhexidine Gluconate Cloth 2 % PADS 6 each (6 each Topical Given by Other 10/25/23 0230)  vasopressin (PITRESSIN) 20 Units in 100 mL (0.2 unit/mL) infusion-*FOR SHOCK* (0.01 Units/min Intravenous New Bag/Given 10/25/23 2300)  norepinephrine (LEVOPHED) 16 mg in (0.064 mg/mL) premix infusion (7 mcg/min Intravenous Rate/Dose Change 10/25/23 1900)  sodium bicarbonate 150 mEq in dextrose 5 % 1,150 mL infusion ( Intravenous Infusion Verify 10/25/23 1900)  piperacillin-tazobactam (ZOSYN) IVPB 2.25 g (2.25 g Intravenous New Bag/Given 10/25/23 2015)  Oral care mouth rinse (has no administration in time range)  amiodarone (PACERONE) tablet 100 mg (has no administration in time range)  sodium chloride 0.9 % bolus 1,000 mL (0 mLs Intravenous Stopped 10/24/23 1419)  sodium chloride 0.9 % bolus 1,000 mL (0 mLs Intravenous Stopped 10/24/23 1452)  ondansetron (ZOFRAN) injection 4 mg (4 mg Intravenous Given 10/24/23 1310)  morphine (PF) 4 MG/ML injection 4 mg (4 mg Intravenous Given 10/24/23 1310)  famotidine (PEPCID) IVPB 20 mg premix (0 mg Intravenous Stopped 10/24/23 1452)  pantoprazole (PROTONIX) injection 40 mg (40 mg Intravenous Given 10/25/23 2110)  glucagon (human recombinant) (GLUCAGEN) injection 1 mg (1 mg Intravenous Given 10/24/23 2237)  methylPREDNISolone sodium succinate (SOLU-MEDROL) 125 mg/2 mL injection (  Duplicate 10/25/23 0156)  hydrocortisone sodium succinate (SOLU-CORTEF) 100 MG injection (  Duplicate 10/25/23 0156)  hydrocortisone sodium  succinate (SOLU-CORTEF) 100 MG injection 100 mg (100 mg Intravenous Given 10/25/23 0156)     IMPRESSION / MDM / ASSESSMENT AND PLAN / ED COURSE  I reviewed the triage vital signs and the nursing notes.                              Differential diagnosis includes, but is not limited to, bowel obstruction, ileus, UTI, PNA, viral GI illness,  medication effect  Patient's presentation is most consistent with acute presentation with potential threat to life or bodily function.  The patient is on the cardiac monitor to evaluate for evidence of arrhythmia and/or significant heart rate changes  72 yo M here with nausea, vomiting, abdominal pain after recent surgery. No BM since surgery. Suspect ileus with vomiting, dehydration. No fevers. CXR is w/o PNA. UA pending but no urinary sx.  CT imaging reviewed, shows acute obstruction with ileus.  Lab work shows dehydration on CMP with metabolic acidosis.  CBC with mild leukocytosis. Suspect dehydration with vomiting in the setting of recent surgery.  His operative site does not appear infected.  Chest x-ray shows no significant pneumonia.  CT abdomen/pelvis reviewed, shows dilated bowel consistent with ileus but otherwise unremarkable.  NG tube placed, IV fluids started, will admit to medicine.  Patient has had no fever, has no pneumonia or significant infectious etiology on CT of his abdomen or pelvis, operative site does not appear infected, no signs of infection at this time but will need to be monitored closely.   FINAL CLINICAL IMPRESSION(S) / ED DIAGNOSES   Final diagnoses:  SBO (small bowel obstruction) (HCC)  Ileus (HCC)     Rx / DC Orders   ED Discharge Orders     None        Note:  This document was prepared using Dragon voice recognition software and may include unintentional dictation errors.   Shaune Pollack, MD 10/25/23 [id number]

## 2023-10-24 NOTE — Progress Notes (Addendum)
2027 - Pt arrived from ED. RN helped transport transfer pt to bed from stretcher. RN assessed the pt and answered pt question. Pt has NG tube and was attached to low intermittent suction per ED nurse. Pt on 3 L [place]. RN performed 4 eye RN skin assessment. Call bell within reach.   2030 - Pt has no order for low intermittent suction, so RN messaged NP for the order. Charge Nurse said that we need an order from Provider for suction.  2147 - Nurse Aide said that pt BG was 20. Rechecked BG was 27. RN notified Hospitalist. RN Physiological scientist.  2150 - RN delegated getting pt BG to nurse aide, who said that she will get it. RN Public affairs consultant notified. Hypoglycemia protocol activated.  2155 - Dextrose 50% solution 50 ML administered.  2158 - BG 121  2221 - BG 66  2229 - Dextrose 50% solution 50 mL administered.  2236 - BG 122  2237 - Per Hospitalist order, Glucagon 1 mg administered IV.  2252 - BG 126

## 2023-10-25 ENCOUNTER — Inpatient Hospital Stay: Payer: Medicare Other

## 2023-10-25 LAB — URINALYSIS, ROUTINE W REFLEX MICROSCOPIC
Bacteria, UA: NONE SEEN
Bilirubin Urine: NEGATIVE
Glucose, UA: 150 mg/dL — AB
Ketones, ur: NEGATIVE mg/dL
Leukocytes,Ua: NEGATIVE
Nitrite: NEGATIVE
Protein, ur: 30 mg/dL — AB
Specific Gravity, Urine: 1.024 (ref 1.005–1.030)
pH: 5 (ref 5.0–8.0)

## 2023-10-25 LAB — BLOOD GAS, ARTERIAL
Acid-base deficit: 9.1 mmol/L — ABNORMAL HIGH (ref 0.0–2.0)
Bicarbonate: 14.9 mmol/L — ABNORMAL LOW (ref 20.0–28.0)
O2 Saturation: 97.1 %
Patient temperature: 37
pCO2 arterial: 27 mm[Hg] — ABNORMAL LOW (ref 32–48)
pH, Arterial: 7.35 (ref 7.35–7.45)
pO2, Arterial: 75 mm[Hg] — ABNORMAL LOW (ref 83–108)

## 2023-10-25 LAB — BASIC METABOLIC PANEL
Anion gap: 12 (ref 5–15)
Anion gap: 15 (ref 5–15)
BUN: 136 mg/dL — ABNORMAL HIGH (ref 8–23)
BUN: 98 mg/dL — ABNORMAL HIGH (ref 8–23)
CO2: 18 mmol/L — ABNORMAL LOW (ref 22–32)
CO2: 16 mmol/L — ABNORMAL LOW (ref 22–32)
Calcium: 7.7 mg/dL — ABNORMAL LOW (ref 8.9–10.3)
Calcium: 7.9 mg/dL — ABNORMAL LOW (ref 8.9–10.3)
Chloride: 100 mmol/L (ref 98–111)
Chloride: 101 mmol/L (ref 98–111)
Creatinine, Ser: 4.31 mg/dL — ABNORMAL HIGH (ref 0.61–1.24)
Creatinine, Ser: 3.83 mg/dL — ABNORMAL HIGH (ref 0.61–1.24)
GFR, Estimated: 14 mL/min — ABNORMAL LOW (ref >60)
GFR, Estimated: 16 mL/min — ABNORMAL LOW (ref >60)
Glucose, Bld: 81 mg/dL (ref 70–99)
Glucose, Bld: 189 mg/dL — ABNORMAL HIGH (ref 70–99)
Potassium: 4.3 mmol/L (ref 3.5–5.1)
Potassium: 4.7 mmol/L (ref 3.5–5.1)
Sodium: 130 mmol/L — ABNORMAL LOW (ref 135–145)
Sodium: 132 mmol/L — ABNORMAL LOW (ref 135–145)

## 2023-10-25 LAB — TSH: TSH: 0.921 u[IU]/mL (ref 0.350–4.500)

## 2023-10-25 LAB — HEPATIC FUNCTION PANEL
ALT: 147 U/L — ABNORMAL HIGH (ref 0–44)
AST: 227 U/L — ABNORMAL HIGH (ref 15–41)
Albumin: 2.7 g/dL — ABNORMAL LOW (ref 3.5–5.0)
Alkaline Phosphatase: 43 U/L (ref 38–126)
Bilirubin, Direct: 0.2 mg/dL (ref 0.0–0.2)
Indirect Bilirubin: 0.6 mg/dL (ref 0.3–0.9)
Total Bilirubin: 0.8 mg/dL (ref <1.2)
Total Protein: 5.3 g/dL — ABNORMAL LOW (ref 6.5–8.1)

## 2023-10-25 LAB — GLUCOSE, CAPILLARY
Glucose-Capillary: 100 mg/dL — ABNORMAL HIGH (ref 70–99)
Glucose-Capillary: 101 mg/dL — ABNORMAL HIGH (ref 70–99)
Glucose-Capillary: 166 mg/dL — ABNORMAL HIGH (ref 70–99)
Glucose-Capillary: 181 mg/dL — ABNORMAL HIGH (ref 70–99)
Glucose-Capillary: 183 mg/dL — ABNORMAL HIGH (ref 70–99)
Glucose-Capillary: 209 mg/dL — ABNORMAL HIGH (ref 70–99)
Glucose-Capillary: 278 mg/dL — ABNORMAL HIGH (ref 70–99)
Glucose-Capillary: 58 mg/dL — ABNORMAL LOW (ref 70–99)
Glucose-Capillary: 59 mg/dL — ABNORMAL LOW (ref 70–99)
Glucose-Capillary: 83 mg/dL (ref 70–99)
Glucose-Capillary: 85 mg/dL (ref 70–99)
Glucose-Capillary: 95 mg/dL (ref 70–99)

## 2023-10-25 LAB — RESPIRATORY PANEL BY PCR

## 2023-10-25 LAB — CBC
HCT: 33.1 % — ABNORMAL LOW (ref 39.0–52.0)
Hemoglobin: 11.8 g/dL — ABNORMAL LOW (ref 13.0–17.0)
MCH: 31.1 pg (ref 26.0–34.0)
MCHC: 35.6 g/dL (ref 30.0–36.0)
MCV: 87.1 fL (ref 80.0–100.0)
Platelets: 143 10*3/uL — ABNORMAL LOW (ref 150–400)
RBC: 3.8 MIL/uL — ABNORMAL LOW (ref 4.22–5.81)
RDW: 13.5 % (ref 11.5–15.5)
WBC: 6.4 10*3/uL (ref 4.0–10.5)
nRBC: 0 % (ref 0.0–0.2)

## 2023-10-25 LAB — BRAIN NATRIURETIC PEPTIDE: B Natriuretic Peptide: 463.1 pg/mL — ABNORMAL HIGH (ref 0.0–100.0)

## 2023-10-25 LAB — PROCALCITONIN: Procalcitonin: 35.61 ng/mL

## 2023-10-25 LAB — LACTIC ACID, PLASMA
Lactic Acid, Venous: 4.2 mmol/L (ref 0.5–1.9)
Lactic Acid, Venous: 3.1 mmol/L (ref 0.5–1.9)

## 2023-10-25 LAB — SARS CORONAVIRUS 2 BY RT PCR: SARS Coronavirus 2 by RT PCR: NEGATIVE

## 2023-10-25 LAB — T4, FREE: Free T4: 1 ng/dL (ref 0.61–1.12)

## 2023-10-25 LAB — MRSA NEXT GEN BY PCR, NASAL: MRSA by PCR Next Gen: NOT DETECTED

## 2023-10-25 LAB — TROPONIN I (HIGH SENSITIVITY): Troponin I (High Sensitivity): 60 ng/L — ABNORMAL HIGH (ref <18)

## 2023-10-25 MED ORDER — CHLORHEXIDINE GLUCONATE CLOTH 2 % EX PADS
6.0000 | MEDICATED_PAD | Freq: Every day | CUTANEOUS | Status: DC
  Administered 2023-10-25 – 2023-11-04 (×12): 6 via TOPICAL

## 2023-10-25 MED ORDER — METHYLPREDNISOLONE SODIUM SUCC 125 MG IJ SOLR: 125.0000 mg | INTRAMUSCULAR | Status: DC

## 2023-10-25 MED ORDER — NOREPINEPHRINE 4 MG/250ML-% IV SOLN: 0.0000 ug/min | INTRAVENOUS | Status: DC

## 2023-10-25 MED ORDER — HYDROCORTISONE SOD SUC (PF) 100 MG IJ SOLR
100.0000 mg | Freq: Once | INTRAMUSCULAR | Status: AC
  Administered 2023-10-25: 100 mg via INTRAVENOUS

## 2023-10-25 MED ORDER — GLUCAGON HCL RDNA (DIAGNOSTIC) 1 MG IJ SOLR: 1.0000 mg | Freq: Once | INTRAMUSCULAR | Status: DC

## 2023-10-25 MED ORDER — ORAL CARE MOUTH RINSE: 15.0000 mL | OROMUCOSAL | Status: DC | PRN

## 2023-10-25 MED ORDER — SODIUM BICARBONATE 8.4 % IV SOLN
INTRAVENOUS | Status: DC
Start: 2023-10-25 — End: 2023-10-26
  Filled 2023-10-25: qty 150
  Filled 2023-10-25 (×3): qty 1000

## 2023-10-25 MED ORDER — NOREPINEPHRINE 4 MG/250ML-% IV SOLN
2.0000 ug/min | INTRAVENOUS | Status: DC
  Administered 2023-10-25: 10 ug/min via INTRAVENOUS

## 2023-10-25 MED ORDER — SODIUM CHLORIDE 0.9 % IV SOLN
250.0000 mL | INTRAVENOUS | Status: AC
  Administered 2023-10-25: 250 mL via INTRAVENOUS

## 2023-10-25 MED ORDER — DEXTROSE 10 % IV SOLN
INTRAVENOUS | Status: DC
Start: 2023-10-25 — End: 2023-10-25

## 2023-10-25 MED ORDER — NOREPINEPHRINE 16 MG/250ML-% IV SOLN
0.0000 ug/min | INTRAVENOUS | Status: DC
  Administered 2023-10-25: 22 ug/min via INTRAVENOUS
  Administered 2023-10-26: 7 ug/min via INTRAVENOUS
  Administered 2023-10-27: 13 ug/min via INTRAVENOUS
  Administered 2023-10-28: 5 ug/min via INTRAVENOUS
  Filled 2023-10-25 (×4): qty 250

## 2023-10-25 MED ORDER — AMIODARONE HCL 200 MG PO TABS
100.0000 mg | ORAL_TABLET | Freq: Every day | ORAL | Status: DC
  Administered 2023-10-27: 100 mg
  Filled 2023-10-25: qty 1

## 2023-10-25 MED ORDER — HYDROCORTISONE SOD SUC (PF) 100 MG IJ SOLR
INTRAMUSCULAR | Status: AC
  Filled 2023-10-25: qty 2

## 2023-10-25 MED ORDER — HYDROCORTISONE SOD SUC (PF) 100 MG IJ SOLR: 100.0000 mg | Freq: Every day | INTRAMUSCULAR | Status: DC

## 2023-10-25 MED ORDER — VASOPRESSIN 20 UNITS/100 ML INFUSION FOR SHOCK
0.0000 [IU]/min | INTRAVENOUS | Status: DC
Start: 2023-10-25 — End: 2023-10-26
  Administered 2023-10-25: 0.01 [IU]/min via INTRAVENOUS
  Administered 2023-10-25: 0.03 [IU]/min via INTRAVENOUS
  Filled 2023-10-25 (×3): qty 100

## 2023-10-25 MED ORDER — PIPERACILLIN-TAZOBACTAM IN DEX 2-0.25 GM/50ML IV SOLN
2.2500 g | Freq: Three times a day (TID) | INTRAVENOUS | Status: DC
  Administered 2023-10-25 – 2023-10-26 (×4): 2.25 g via INTRAVENOUS
  Filled 2023-10-25 (×6): qty 50

## 2023-10-25 MED ORDER — METHYLPREDNISOLONE SODIUM SUCC 125 MG IJ SOLR
INTRAMUSCULAR | Status: AC
  Filled 2023-10-25: qty 2

## 2023-10-25 NOTE — Inpatient Diabetes Management (Signed)
Inpatient Diabetes Program Recommendations  AACE/ADA: New Consensus Statement on Inpatient Glycemic Control (2015)  Target Ranges:  Prepandial:   less than 140 mg/dL      Peak postprandial:   less than 180 mg/dL (1-2 hours)      Critically ill patients:  140 - 180 mg/dL   Lab Results  Component Value Date   GLUCAP 166 (H) 10/25/2023   HGBA1C 5.8 (H) 10/13/2023    Latest Reference Range & Units 10/25/23 00:05 10/25/23 00:58 10/25/23 01:03 10/25/23 01:19 10/25/23 02:34 10/25/23 04:03 10/25/23 05:57 10/25/23 08:21 10/25/23 08:32  Glucose-Capillary 70 - 99 mg/dL 85 58 (L) 578 (H) 95 59 (L) 83 101 (H) 278 (H) 166 (H) Novolog 2 units  (L): Data is abnormally low (H): Data is abnormally high  Diabetes history: DM2 Outpatient Diabetes medications: Farxiga 10 mg daily. Amaryl 4 mg daily, Metformin 1 gm bid Current orders for Inpatient glycemic control: Novolog 0-9 units tid, 0-5 units hs  Inpatient Diabetes Program Recommendations:   Noted patient was discharged on 10/19/23 post spinal surgery. A1c 5.8 which indicates average CBG 120 over the past 2-3 months Please consider: -Change Novolog correction to 0-6 units q 4 hrs. While NPO -Consider D/C of Amaryl on discharge  Thank you, Jason [name]. Mcclellan Demarais, RN, MSN, CDCES  Diabetes Coordinator Inpatient Glycemic Control Team Team Pager [phone] (8am-5pm) 10/25/2023 11:44 AM]

## 2023-10-25 NOTE — Progress Notes (Signed)
Initial Nutrition Assessment  DOCUMENTATION CODES:   Not applicable  INTERVENTION:   Recommend [name] if unable to initiate tube feeds or an oral diet by 12/31  Pt at high refeed risk; recommend monitor potassium, magnesium and phosphorus labs daily until stable  Daily weights   Recommend thiamine 100mg  IV daily x 7 days   NUTRITION DIAGNOSIS:   Inadequate oral intake related to acute illness as evidenced by per patient/family report.  GOAL:   Patient will meet greater than or equal to 90% of their needs  MONITOR:   Diet advancement, Labs, Weight trends, I & O's, Skin  REASON FOR ASSESSMENT:   Malnutrition Screening Tool    ASSESSMENT:   72 y/o male with h/o CAD s/p [name], DM, HTN, PAF, CHF, GERD and spinal stenosis s/p bilateral L3-4 and L4-5 laminotomy/foraminotomies 12/23 who is admitted with sepsis, AKI and suspected ileus.  Met with pt in room today. Pt reports poor appetite and oral intake since having his surgery. Pt reports nausea and vomiting that started the day of his admission. Pt has now been without adequate nutrition for > 7 days. CT scan today reporting suspected ileus. NGT to LIS with minimal output today. Would recommend [name] initiation tomorrow if pt is unable to initiate on tube feeds or an oral diet. Pt is at high refeed risk. Per chart, pt appears weight stable since surgery if his admission weight is correct.   Medications reviewed and include: heparin, insulin, synthroid, protonix, levophed, zosyn, Na bicarbonate, vasopressin   Labs reviewed: Na 132(L), K 4.7 wnl, BUN 98(H), creat 3.83(H) Cbgs- 181, 183, 166, 278, 101, 83, 59, 95 x 24 hrs  AIC 5.8(H)- 12/18  NUTRITION - FOCUSED PHYSICAL EXAM:  Flowsheet Row Most Recent Value  Orbital Region No depletion  Upper Arm Region No depletion  Thoracic and Lumbar Region No depletion  Buccal Region No depletion  Temple Region No depletion  Clavicle Bone Region No depletion  Clavicle and Acromion Bone  Region No depletion  Scapular Bone Region No depletion  Dorsal Hand No depletion  Patellar Region No depletion  Anterior Thigh Region No depletion  Posterior Calf Region No depletion  Edema (RD Assessment) Mild  Hair Reviewed  Eyes Reviewed  Mouth Reviewed  Skin Reviewed  Nails Reviewed   Diet Order:   Diet Order             Diet NPO time specified  Diet effective now                  EDUCATION NEEDS:   No education needs have been identified at this time  Skin:  Skin Assessment: Reviewed RN Assessment (incision back)  Last BM:  pta  Height:   Ht Readings from Last 1 Encounters:  10/24/23 6\' 4"  (1.93 m)    Weight:   Wt Readings from Last 1 Encounters:  10/24/23 113.3 kg    Ideal Body Weight:  91.8 kg  BMI:  Body mass index is 30.4 kg/m.  Estimated Nutritional Needs:   Kcal:  2500-2800kcal/day  Protein:  125-140g/day  Fluid:  2.3-2.6L/day  Betsey Holiday MS, RD, LDN If unable to be reached, please send secure chat to "RD inpatient" available from 8:00a-4:00p daily

## 2023-10-25 NOTE — Plan of Care (Signed)

## 2023-10-25 NOTE — Plan of Care (Signed)
Patient alert with confusion. Foley catheter placed per order. Levophed titrated during shift. Lactic acid 3.1. Continue to assess.

## 2023-10-25 NOTE — Progress Notes (Signed)
eLink Physician-Brief Progress Note Patient Name: Jason [name] DOB: 1951/03/11 MRN: [id number]   Date of Service  10/25/2023  HPI/Events of Note  New pt eval  eICU Interventions       Possible Ileus AKI Lactic acidosis  IVF Follow electrolytes   NG to intermittent suctoin Repeat imaging if no improvement Trend lactic/CBC     Erie Sica 10/25/2023, 1:33 AM

## 2023-10-25 NOTE — Procedures (Signed)
ARTERIAL CATHETER INSERTION PROCEDURE NOTE   VICENZO ORTIGOZA  [id number]  [birth year]  Date:10/25/23  Time:2:47 AM   Provider Performing: Loraine Leriche   Procedure: Insertion of Arterial Line (78469) with US guidance (62952)   Indication(s) Blood pressure monitoring and/or need for frequent ABGs  Consent Unable to obtain consent due to emergent nature of procedure.  Anesthesia None  Time Out Verified patient identification, verified procedure, site/side was marked, verified correct patient position, special equipment/implants available, medications/allergies/relevant history reviewed, required imaging and test results available.  Sterile Technique Maximal sterile technique including full sterile barrier drape, hand hygiene, sterile gown, sterile gloves, mask, hair covering, sterile ultrasound probe cover (if used).  Procedure Description Area of catheter insertion was cleaned with chlorhexidine and draped in sterile fashion. With real-time ultrasound guidance an arterial catheter was placed into the right femoral artery.  Appropriate arterial tracings confirmed on monitor.    Complications/Tolerance None; patient tolerated the procedure well.   EBL Minimal   Specimen(s) None   Webb Silversmith, DNP, CCRN, FNP-C, AGACNP-BC Acute Care & Family Nurse Practitioner  [facility]  See Amion for personal pager PCCM on call pager [phone] until 7 am

## 2023-10-25 NOTE — Progress Notes (Signed)
Rapid Response Event Note   Reason for Call :  - Patient newly somnolent - Low blood pressure - Hypoglycemia  Initial Focused Assessment:   Patient lying in bed with eyes closed. Per floor RN, patient has had a change in mental status and is no longer alert and oriented. Patient groans in response to painful stimulus only (sternal rub). Upon checking CBG, it was found to be 58. Blood pressure is low at 70/37 (49) via automatic cuff and confirmed with manual check.  Interventions:  - Modou Jawo, NP, in to see patient - Dextrose 50 % solution for hypoglycemia - Transfer to higher level of care (ICU) for vasopressors - Intensivist consult  Plan of Care:  - Vasopressors - Dextrose 10% infusion - Lab work  Event Summary:   MD Notified: Larkin Ina, NP Call Time: [phone] Arrival Time: 5427 End Time: 0120  Carmel Sacramento, RN

## 2023-10-25 NOTE — Consult Note (Addendum)
NAME:  Jason [name], MRN:  [id number], DOB:  Jan 16, 1951, LOS: 1 ADMISSION DATE:  10/24/2023, CONSULTATION DATE:  10/25/23 REFERRING MD: Larkin Ina NP CHIEF COMPLAINT: Generalized weakness   HPI  72 y.o with significant PMH of lumbar spinal stenosis L3-4, L4-5 laminotomy/foraminotomies, T2DM, PAF, systolic CHF EF 35% s/p ICD placement, CAD status post [name], HTN, HLD who presented to the ED with chief complaints of generalized weakness with associated nausea and vomiting since discharge from the hospital following admission for uncomplicated L3-4, L4-5 laminotomy and foraminotomies on 10/19/2023.   ED Course: Initial vital signs showed HR of 105 beats/minute, BP 114/84 mm Hg, the RR 20 breaths/minute, and the oxygen saturation 95% on RA and a temperature of 97.43F (36.6C).   Pertinent Labs/Diagnostics Findings: Na+/ K+: 130/4.4.  Glucose: 166 BUN/Cr.:  89/3.8, AST/ALT: 84/118, CO2 13, anion gap 24 WBC: 11.1  PCT: 11.21 Lactic acid: 8.8 CXR> CT Abd/pelvis> see results Medication administered in the WU:[id number] 4 mg IV x 2 doses, sodium chloride 2 L boluses total were given. EDP also ordered NG tube placement for suspected postop ileus. Disposition: Admitted to MedSurg unit under hospitalist service  Past Medical History  Lumbar spinal stenosis L3-4, L4-5 laminotomy/foraminotomies, T2DM, PAF, systolic CHF EF 35% s/p ICD placement, CAD status post [name], HTN, HLD  Significant Hospital Events   12/29: Admitted to MedSurg unit with suspected postop ileus NG tube placed for decompression.  Overnight became hypoglycemic requiring dextrose infusion 12/30: Rapid response called for unresponsiveness, hypoglycemia in the low 20s and hypertension (70/37).  Transferred to ICU PCCM consulted  Consults:  PCCM  Procedures:  12/30 right femoral art line 12/30 right femoral central line  Significant Diagnostic Tests:  12/29: Chest Xray> IMPRESSION: 1. Minor left basilar atelectasis.  Otherwise no acute cardiopulmonary findings. 2. Gaseous distension of bowel within the included upper abdomen.  12/30: Noncontrast CT head> IMPRESSION: No acute intracranial abnormality.  12/30: CTA abdomen and pelvis> IMPRESSION: 1. Findings favoring ileus with persistent air and fluid distension of the small bowel, ascending, and transverse. There is no discrete transition point. Enteric tube decompresses the stomach with diminished gastric distension from yesterday. 2. No free air or mesenteric gas. 3. Progressive atelectasis in the medial left greater than right lower lobes. 4. Moderate-sized fat containing umbilical hernia.    Interim History / Subjective:    -  Micro Data:  12/29: Blood culture x2> 12/30: MRSA PCR>>   Antimicrobials:  Azithromycin 12/29 Ceftriaxone 12/29  OBJECTIVE  Blood pressure (!) 70/37, pulse (!) 130, temperature 98.7 F (37.1 C), temperature source Oral, resp. rate (!) 34, height 6\' 4"  (1.93 m), weight 113.3 kg, SpO2 98%.      No intake or output data in the 24 hours ending 10/25/23 0250 Filed Weights   10/24/23 1155  Weight: 113.3 kg    Physical Examination  GENERAL: 72 year-old critically ill patient lying in the bed in no acute distress EYES: PEERLA. No scleral icterus. Extraocular muscles intact.  HEENT: Head atraumatic, normocephalic. Oropharynx and nasopharynx clear.  NECK:  No JVD, supple  LUNGS: Normal breath sounds bilaterally.  No use of accessory muscles of respiration.  CARDIOVASCULAR: S1, S2 normal. No murmurs, rubs, or gallops.  ABDOMEN: Soft, NTND EXTREMITIES: trace swelling.  Capillary refill < 3 seconds in all extremities. Pulses palpable distally. NEUROLOGIC: The patient is  oriented to self only. No focal neurological deficit appreciated. Cranial nerves are intact.  SKIN: No obvious rash, lesion, or ulcer.  Warm to touch Labs/imaging that I havepersonally reviewed  (right click and "Reselect all SmartList  Selections" daily)     Labs   CBC: Recent Labs  Lab 10/24/23 0700 10/25/23 0205  WBC 11.1* 6.4  HGB 13.9 11.8*  HCT 40.5 33.1*  MCV 90.4 87.1  PLT 244 143*    Basic Metabolic Panel: Recent Labs  Lab 10/24/23 0700  NA 130*  K 4.4  CL 93*  CO2 13*  GLUCOSE 166*  BUN 89*  CREATININE 3.81*  CALCIUM 9.5  PHOS 4.2   GFR: Estimated Creatinine Clearance: 23.8 mL/min (A) (by C-G formula based on SCr of 3.81 mg/dL (H)). Recent Labs  Lab 10/24/23 0700 10/24/23 1232 10/24/23 1314 10/24/23 1620 10/25/23 0205  PROCALCITON  --   --  11.21  --   --   WBC 11.1*  --   --   --  6.4  LATICACIDVEN  --  8.8* 8.4* 6.1* 4.2*    Liver Function Tests: Recent Labs  Lab 10/24/23 0700  AST 84*  ALT 118*  ALKPHOS 57  BILITOT 1.9*  PROT 7.2  ALBUMIN 3.6   No results for input(s): "LIPASE", "AMYLASE" in the last 168 hours. No results for input(s): "AMMONIA" in the last 168 hours.  ABG    Component Value Date/Time   PHART 7.328 (L) 04/14/2016 2006   PCO2ART 47.5 (H) 04/14/2016 2006   PO2ART 66.0 (L) 04/14/2016 2006   HCO3 25.0 (H) 04/14/2016 2006   TCO2 26 04/15/2016 1707   ACIDBASEDEF 1.0 04/14/2016 2006   O2SAT 91.0 04/14/2016 2006     Coagulation Profile: No results for input(s): "INR", "PROTIME" in the last 168 hours.  Cardiac Enzymes: No results for input(s): "CKTOTAL", "CKMB", "CKMBINDEX", "TROPONINI" in the last 168 hours.  HbA1C: Hgb A1c MFr Bld  Date/Time Value Ref Range Status  10/13/2023 12:00 PM 5.8 (H) 4.8 - 5.6 % Final    Comment:    (NOTE) Pre diabetes:          5.7%-6.4%  Diabetes:              >6.4%  Glycemic control for   <7.0% adults with diabetes   04/13/2016 06:37 PM 7.6 (H) 4.8 - 5.6 % Final    Comment:    (NOTE)         Pre-diabetes: 5.7 - 6.4         Diabetes: >6.4         Glycemic control for adults with diabetes: <7.0     CBG: Recent Labs  Lab 10/24/23 2252 10/25/23 0005 10/25/23 0058 10/25/23 0103 10/25/23 0234   GLUCAP 126* 85 58* 100* 59*    Review of Systems:   Unable to be obtained secondary to the patient's intubated altered mental status.   Past Medical History  He,  has a past medical history of AICD (automatic cardioverter/defibrillator) present, Arthritis, CHF (congestive heart failure) (HCC), Coronary artery disease involving native coronary artery with unstable angina pectoris (HCC) (04/13/2016), Coronary artery disease with history of myocardial infarction without history of [name], Cough, Diabetes mellitus without complication (HCC), Edema, Essential hypertension, Hypertension, Hypothyroidism, Left main coronary artery disease (04/13/2016), Mixed hyperlipidemia, Obesity, PAF (paroxysmal atrial fibrillation) (HCC), S/P [name] (04/14/2016), Type II diabetes mellitus (HCC), and Unstable angina (HCC) (04/13/2016).   Surgical History    Past Surgical History:  Procedure Laterality Date   BACK SURGERY     Lumbar Surgeries. [facility] [place]. Dr. Elesa Hacker x2   CARDIAC CATHETERIZATION Left  04/13/2016   Procedure: Left Heart Cath and Coronary Angiography;  Surgeon: [name], MD;  Location: [facility] INVASIVE CV LAB;  Service: Cardiovascular;  Laterality: Left;   CARDIOVERSION N/A 08/02/2018   Procedure: CARDIOVERSION;  Surgeon: [name], MD;  Location: [facility] ORS;  Service: Cardiovascular;  Laterality: N/A;   CATARACT EXTRACTION W/PHACO Right 01/23/2016   Procedure: CATARACT EXTRACTION PHACO AND INTRAOCULAR LENS PLACEMENT (IOC);  Surgeon: Galen Manila, MD;  Location: [facility] ORS;  Service: Ophthalmology;  Laterality: Right;  [place] 01:18 AP% 23.8 CDE 18.67 fluid pack lot # [id number]   CHOLECYSTECTOMY     CORONARY ARTERY BYPASS GRAFT N/A 04/14/2016   Procedure: CORONARY ARTERY BYPASS GRAFTING TIMES FOUR    USING LEFT INTERNAL MAMMARY AND ENDOSCOPIC HARVEST RIGHT SAPHENOUS VEIN;  Surgeon: Purcell Nails, MD;  Location: [facility] OR;  Service: Open Heart Surgery;  Laterality: N/A;   EYE  SURGERY Bilateral    Cataract Extraction with IOL implant.   INTRAOPERATIVE TRANSESOPHAGEAL ECHOCARDIOGRAM N/A 04/14/2016   Procedure: INTRAOPERATIVE TRANSESOPHAGEAL ECHOCARDIOGRAM;  Surgeon: Purcell Nails, MD;  Location[facility];  Service: Open Heart Surgery;  Laterality: N/A;   KNEE ARTHROSCOPY Left 10/08/2015   Procedure: ARTHROSCOPY KNEE, PARTIAL MEDIAL AND LATERAL MENISECTOMY, REMOVAL OF FOREIGN BODY;  Surgeon: Kennedy Bucker, MD;  Location: [facility] ORS;  Service: Orthopedics;  Laterality: Left;   LUMBAR LAMINECTOMY/DECOMPRESSION MICRODISCECTOMY Bilateral 10/18/2023   Procedure: Lumbar Three-Four, Lumbar Four-Five  Laminotomy, Foraminotomy;  Surgeon: Tressie Stalker, MD;  Location: [facility] OR;  Service: Neurosurgery;  Laterality: Bilateral;  3C     Social History   reports that he has never smoked. His smokeless tobacco use includes chew. He reports that he does not drink alcohol and does not use drugs.   Family History   His family history includes Aortic stenosis in his father; Heart disease in his father; Hypertension in his father.   Allergies Allergies  Allergen Reactions   Aspirin Anaphylaxis   Inderal [Propranolol]     Other reaction(s): Other (See Comments) Fatigue   Metformin Hcl Diarrhea    Pt still takes medication   Mevacor [Lovastatin] Other (See Comments)    muscle Pain   Penicillin G Hives   Pravachol [Pravastatin Sodium] Other (See Comments)    Muscle Pain   Zocor [Simvastatin] Other (See Comments)    Muscle Pain   Penicillins Rash    Has patient had a PCN reaction causing immediate rash, facial/tongue/throat swelling, SOB or lightheadedness with hypotension: Yes Has patient had a PCN reaction causing severe rash involving mucus membranes or skin necrosis: Yes Has patient had a PCN reaction that required hospitalization No Has patient had a PCN reaction occurring within the last 10 years: Yes If all of the above answers are "NO", then may proceed with Cephalosporin  use.      Home Medications  Prior to Admission medications   Medication Sig Start Date End Date Taking? Authorizing Provider  acetaminophen (TYLENOL) 500 MG tablet Take 500-1,000 mg by mouth every 6 (six) hours as needed (pain.).    [provider]  amiodarone (PACERONE) 200 MG tablet Take 100 mg by mouth in the morning.    [provider]  apixaban (ELIQUIS) 5 MG TABS tablet Take 1 tablet (5 mg total) by mouth 2 (two) times daily. 10/25/23   Patrici Ranks Caylin, PA-C  cyclobenzaprine (FLEXERIL) 10 MG tablet Take 1 tablet (10 mg total) by mouth 3 (three) times daily as needed for muscle spasms. 10/19/23   Patrici Ranks  Caylin, PA-C  dapagliflozin propanediol (FARXIGA) 10 MG TABS tablet Take 10 mg by mouth in the morning.    [provider]  docusate sodium (COLACE) 100 MG capsule Take 1 capsule (100 mg total) by mouth 2 (two) times daily. 10/19/23 10/18/24  Patrici Ranks Caylin, PA-C  gabapentin (NEURONTIN) 100 MG capsule Take 100 mg by mouth at bedtime as needed (nerve pain.). 06/30/23   [provider]  glimepiride (AMARYL) 4 MG tablet Take 4 mg by mouth 2 (two) times daily. Reported on 04/13/2016    [provider]  levothyroxine (SYNTHROID) 50 MCG tablet Take 50 mcg by mouth daily at 2 am. 08/08/23   [provider]  metFORMIN (GLUCOPHAGE) 1000 MG tablet Take 1,000 mg by mouth in the morning and at bedtime.    [provider]  metoprolol succinate (TOPROL-XL) 100 MG 24 hr tablet Take 100 mg by mouth in the morning. Take with or immediately following a meal.    [provider]  Oxycodone HCl 10 MG TABS Take 1 tablet (10 mg total) by mouth every 4 (four) hours as needed for severe pain (pain score 7-10). 10/19/23   Patrici Ranks Caylin, PA-C  rosuvastatin (CRESTOR) 40 MG tablet Take 20 mg by mouth at bedtime. 07/01/23   [provider]  sacubitril-valsartan (ENTRESTO) 49-51 MG Take 1 tablet by mouth 2 (two)  times daily.    [provider]  torsemide (DEMADEX) 20 MG tablet Take 20 mg by mouth in the morning. 11/23/22   [provider]  Scheduled Meds:  amiodarone  100 mg Oral Daily   Chlorhexidine Gluconate Cloth  6 each Topical Daily   glucagon (human recombinant)  1 mg Intravenous Once   heparin  5,000 Units Subcutaneous Q8H   hydrocortisone sod succinate (SOLU-CORTEF) inj  100 mg Intravenous Daily   insulin aspart  0-5 Units Subcutaneous QHS   insulin aspart  0-9 Units Subcutaneous TID WC   levothyroxine  50 mcg Oral Q0600   pantoprazole (PROTONIX) IV  40 mg Intravenous BID   Continuous Infusions:  sodium chloride     azithromycin Stopped (10/24/23 1853)   cefTRIAXone (ROCEPHIN)  IV Stopped (10/24/23 1853)   dextrose 50 mL/hr at 10/25/23 0157   norepinephrine (LEVOPHED) Adult infusion     vasopressin 0.03 Units/min (10/25/23 0157)   PRN Meds:.acetaminophen **OR** acetaminophen, cyclobenzaprine, dextrose, fentaNYL (SUBLIMAZE) injection, hydrALAZINE, metoprolol tartrate, morphine injection, ondansetron **OR** ondansetron (ZOFRAN) IV   Active Hospital Problem list   See systems below  Assessment & Plan:  #Ileus Suspect post op related due to resent spinal surgery Repeat CT abd/pelvis ileus with persistent air and fluid distension of the small bowel, ascending, and transverse. -Keep Npo -Gentle IVFs -NGT for decompression -Surgery consult -Hold narcotics  #Sepsis due to Suspected Intra-abdominal source -F/u cultures, trend lactic/ PCT -Monitor WBC/ fever curve -IV antibiotics: ceftriaxone and Azithromycin -IVF hydration as needed -Pressors for MAP goal >65 -Strict I/O's  #AKI Likely ATN~worsening  #Hyponatremia #NAGMA with Lactic Acidosis -Trend Lactate -Avoid nephrotoxic agents  -Follow BMP -Replace electrolytes as indicated -Nephrology consult   #Elevated LFTs -shock liver in the setting of severe sepsis with hypotension -Trend cmp  #Mildly  Elevated Troponin, suspect demand ischemia #HFrEF  EF 35% s/p ICD placement  #Hypertension PMHx: CAD s/p [name] X4, PAF, HLD BNP mildly elevated without overt signs of volume overload or pulmonary edema -Trend HS Troponin until peaked -Hold torsemide, Entresto, metoprolol, Farxiga and amiodarone  -Continue rosuvastatin once able to tolerate  p.o. -Hold Eliquis for now -Obtain 2D echo  #T2 Diabetes mellitus  -Check hemoglobin A1c -CBGs -Sliding scale insulin -Follow ICU hyper/hypoglycemia protocol -Hold metformin and glimepiride   #Chronic Pain #Hx of Chronic Bilateral Low Back Pain  #hx of severe lumbar stenosis from L3-L5 with right lower extremity radiculopathy s/p  L3-4, L4-5 laminotomy and foraminotomies on 10/19/2023  -Follows with Neurosugeon  -on Gabapentin and Flexeril, and nerve block PRN  #Hypothyroidism -Check TSH and free T4 -Continue home Synthroid  Best practice:  Diet:  NPO Pain/Anxiety/Delirium protocol (if indicated): No VAP protocol (if indicated): Not indicated DVT prophylaxis: Subcutaneous Heparin GI prophylaxis: PPI Glucose control:  SSI No Central venous access:  Yes, and it is still needed Arterial line:  Yes, and it is still needed Foley:  N/A Mobility:  bed rest  PT consulted: N/A Last date of multidisciplinary goals of care discussion [updated wife over the phone] Code Status:  full code Disposition: ICU   = Goals of Care = Code Status Order: FULL  Primary Emergency Contact: Angulo,Katie V, Home Phone: [phone] Wishes to pursue full aggressive treatment and intervention options, including CPR and intubation, but goals of care will be addressed on going with family if that should become necessary.  Critical care time: 45 minutes        Webb Silversmith DNP, CCRN, FNP-C, AGACNP-BC Acute Care & Family Nurse Practitioner [facility] PCCM on call pager [phone]

## 2023-10-25 NOTE — Procedures (Addendum)
CENTRAL VENOUS CATHETER PLACEMENT   Patient Name: Jason [name]   MRN: [id number]   Date of Birth/ Sex: 1951/06/21 , male      Admission Date: 10/24/2023  Attending Provider: Lovenia Kim, DO  Primary Diagnosis: Ileus (HCC)    PROCEDURE: U/S Guided Femoral Central Line Placement   INDICATION (S): Medication administration and Difficult access  PROCEDURE OPERATOR: Webb Silversmith NP  CONSENT: Unable to obtain consent due to emergent nature of procedure.  PROCEDURE SUMMARY: A time-out was performed. The patient's RIGHT Groin region was prepped and draped in sterile fashion using chlorhexidine scrub. Anesthesia was achieved with 1% lidocaine. The RIGHT internal femoral vein was accessed under ultrasound guidance using a finder needle and sheath. U/S images were permanently documented. Venous blood was withdrawn and the sheath was advanced into the vein and the needle was withdrawn. A guidewire was advanced through the sheath. A small incision was made with a 10-blade scalpel and the sheath was exchanged for a dilator over the guidewire until appropriate dilation was obtained. The dilator was removed and an 8.5 [place] central venous TRIPLE-lumen catheter was advanced over the guidewire and secured into place with 4 sutures at 20 cm. At time of procedure completion, all ports aspirated and flushed properly.   COMPLICATIONS: None; patient tolerated the procedure well. Chest X-ray is ordered to verify placement for internal jugular or subclavian cannulation.   Chest x-ray is not ordered for femoral cannulation.  ESTIMATED BLOOD LOSS: None   Webb Silversmith, DNP, CCRN, FNP-C, AGACNP-BC Acute Care & Family Nurse Practitioner  [facility]  See Amion for personal pager PCCM on call pager [phone] until 7 am

## 2023-10-25 NOTE — Progress Notes (Signed)
       CROSS COVER NOTE  NAME: Jason [name] MRN: [id number] DOB : 1951/05/24    Date of Service   10/25/2023   HPI/Events of Note   Nurse paged because patient was noted to be unresponsive with the following vitals:BP 70/37, RR 34, T 98.7 F, HR 130.  Nurse called the rapid response.  Upon arrival at bedside patient was noted to be obtunded in the bed patient not responding to verbal stimuli with minimal response to pain stimulus.  Care nurse was asked repeat patient's blood glucose at that time and it was 58.  Patient was given an amp of D50 and another milligram of glucagon IV ordered.  Given patient suddenly became hypotensive and care nurse reporting increased output from the NG, there was concern for possible worsening of his ileus with potential acute abdomen given the blood pressure and lactic acid reading that was 9.  Interventions   -CBC, BMP, lactic acid, send stat. -Ordered peripheral norepinephrine to be commenced. -ICU nurse practitioner consulted, and patient immediately transferred to ICU.     Nicle Connole Lamin Geradine Girt, MSN, APRN, AGACNP-BC [facility] Pager: [phone]. Check Amion for Availability

## 2023-10-26 ENCOUNTER — Inpatient Hospital Stay: Payer: Medicare Other

## 2023-10-26 ENCOUNTER — Inpatient Hospital Stay (HOSPITAL_COMMUNITY)
Admit: 2023-10-26 | Discharge: 2023-10-26 | Disposition: A | Discharge status: Home / Self Care | Payer: Medicare Other | Attending: Critical Care Medicine

## 2023-10-26 ENCOUNTER — Other Ambulatory Visit: Payer: Self-pay

## 2023-10-26 DIAGNOSIS — R578 Other shock: Secondary | ICD-10-CM | POA: Diagnosis not present

## 2023-10-26 LAB — CBC
HCT: 32.8 % — ABNORMAL LOW (ref 39.0–52.0)
Hemoglobin: 11.4 g/dL — ABNORMAL LOW (ref 13.0–17.0)
MCH: 30.2 pg (ref 26.0–34.0)
MCHC: 34.8 g/dL (ref 30.0–36.0)
MCV: 87 fL (ref 80.0–100.0)
Platelets: 131 10*3/uL — ABNORMAL LOW (ref 150–400)
RBC: 3.77 MIL/uL — ABNORMAL LOW (ref 4.22–5.81)
RDW: 13.3 % (ref 11.5–15.5)
WBC: 7.2 10*3/uL (ref 4.0–10.5)
nRBC: 0 % (ref 0.0–0.2)

## 2023-10-26 LAB — GLUCOSE, CAPILLARY
Glucose-Capillary: 205 mg/dL — ABNORMAL HIGH (ref 70–99)
Glucose-Capillary: 191 mg/dL — ABNORMAL HIGH (ref 70–99)
Glucose-Capillary: 161 mg/dL — ABNORMAL HIGH (ref 70–99)
Glucose-Capillary: 148 mg/dL — ABNORMAL HIGH (ref 70–99)
Glucose-Capillary: 151 mg/dL — ABNORMAL HIGH (ref 70–99)
Glucose-Capillary: 152 mg/dL — ABNORMAL HIGH (ref 70–99)

## 2023-10-26 LAB — COMPREHENSIVE METABOLIC PANEL
ALT: 125 U/L — ABNORMAL HIGH (ref 0–44)
AST: 131 U/L — ABNORMAL HIGH (ref 15–41)
Albumin: 2.4 g/dL — ABNORMAL LOW (ref 3.5–5.0)
Alkaline Phosphatase: 50 U/L (ref 38–126)
Anion gap: 12 (ref 5–15)
BUN: 96 mg/dL — ABNORMAL HIGH (ref 8–23)
CO2: 21 mmol/L — ABNORMAL LOW (ref 22–32)
Calcium: 8.1 mg/dL — ABNORMAL LOW (ref 8.9–10.3)
Chloride: 100 mmol/L (ref 98–111)
Creatinine, Ser: 3.6 mg/dL — ABNORMAL HIGH (ref 0.61–1.24)
GFR, Estimated: 17 mL/min — ABNORMAL LOW (ref >60)
Glucose, Bld: 210 mg/dL — ABNORMAL HIGH (ref 70–99)
Potassium: 4.2 mmol/L (ref 3.5–5.1)
Sodium: 133 mmol/L — ABNORMAL LOW (ref 135–145)
Total Bilirubin: 1 mg/dL (ref 0.0–1.2)
Total Protein: 5.1 g/dL — ABNORMAL LOW (ref 6.5–8.1)

## 2023-10-26 LAB — PHOSPHORUS: Phosphorus: 3.5 mg/dL (ref 2.5–4.6)

## 2023-10-26 LAB — MAGNESIUM: Magnesium: 2 mg/dL (ref 1.7–2.4)

## 2023-10-26 LAB — ECHOCARDIOGRAM COMPLETE
AR max vel: 2.27 cm2
AV Area VTI: 2.56 cm2
AV Area mean vel: 2.06 cm2
AV Mean grad: 4.3 mm[Hg]
AV Peak grad: 7.8 mm[Hg]
Ao pk vel: 1.4 m/s
Area-P 1/2: 7.59 cm2
Height: 76 in
MV VTI: 2.58 cm2
S' Lateral: 2.7 cm
Weight: 3996.5 [oz_av]

## 2023-10-26 LAB — STREP PNEUMONIAE URINARY ANTIGEN: Strep Pneumo Urinary Antigen: NEGATIVE

## 2023-10-26 LAB — C-REACTIVE PROTEIN: CRP: 16.6 mg/dL — ABNORMAL HIGH (ref <1.0)

## 2023-10-26 LAB — D-DIMER, QUANTITATIVE: D-Dimer, Quant: 6.88 ug{FEU}/mL — ABNORMAL HIGH (ref 0.00–0.50)

## 2023-10-26 LAB — PROCALCITONIN: Procalcitonin: 51.3 ng/mL

## 2023-10-26 LAB — LIPASE, BLOOD: Lipase: 19 U/L (ref 11–51)

## 2023-10-26 LAB — LACTIC ACID, PLASMA: Lactic Acid, Venous: 1.4 mmol/L (ref 0.5–1.9)

## 2023-10-26 MED ORDER — FAT EMUL FISH OIL/PLANT BASED 20% (SMOFLIPID)IV EMUL
250.0000 mL | INTRAVENOUS | Status: AC
  Administered 2023-10-26 (×2): 250 mL via INTRAVENOUS
  Filled 2023-10-26: qty 250

## 2023-10-26 MED ORDER — THIAMINE HCL 100 MG/ML IJ SOLN
100.0000 mg | Freq: Once | INTRAMUSCULAR | Status: AC
Start: 2023-10-26 — End: 2023-10-26
  Administered 2023-10-26: 100 mg via INTRAVENOUS
  Filled 2023-10-26: qty 2

## 2023-10-26 MED ORDER — FAT EMUL FISH OIL/PLANT BASED 20% (SMOFLIPID)IV EMUL
250.0000 mL | INTRAVENOUS | Status: DC
  Filled 2023-10-26: qty 250

## 2023-10-26 MED ORDER — THIAMINE HCL 100 MG/ML IJ SOLN
100.0000 mg | INTRAMUSCULAR | Status: AC
  Administered 2023-10-27 – 2023-10-31 (×5): 100 mg via INTRAVENOUS
  Filled 2023-10-26 (×5): qty 2

## 2023-10-26 MED ORDER — LIDOCAINE 5 % EX PTCH
1.0000 | MEDICATED_PATCH | CUTANEOUS | Status: DC
  Administered 2023-10-26 – 2023-11-03 (×9): 1 via TRANSDERMAL
  Filled 2023-10-26 (×10): qty 1

## 2023-10-26 MED ORDER — INSULIN ASPART 100 UNIT/ML IJ SOLN
0.0000 [IU] | INTRAMUSCULAR | Status: DC
  Administered 2023-10-26 – 2023-10-27 (×5): 3 [IU] via SUBCUTANEOUS
  Administered 2023-10-27: 5 [IU] via SUBCUTANEOUS
  Administered 2023-10-27: 3 [IU] via SUBCUTANEOUS
  Administered 2023-10-27 – 2023-10-28 (×3): 5 [IU] via SUBCUTANEOUS
  Filled 2023-10-26 (×11): qty 1

## 2023-10-26 MED ORDER — PIPERACILLIN-TAZOBACTAM 3.375 G IVPB
3.3750 g | Freq: Three times a day (TID) | INTRAVENOUS | Status: DC
  Administered 2023-10-26 – 2023-10-30 (×11): 3.375 g via INTRAVENOUS
  Filled 2023-10-26 (×11): qty 50

## 2023-10-26 MED ORDER — SODIUM CHLORIDE 0.9% FLUSH
10.0000 mL | Freq: Two times a day (BID) | INTRAVENOUS | Status: DC
  Administered 2023-10-26 – 2023-10-29 (×7): 10 mL
  Administered 2023-10-30: 40 mL
  Administered 2023-10-30 – 2023-11-02 (×6): 10 mL
  Administered 2023-11-02: 40 mL
  Administered 2023-11-03 – 2023-11-04 (×3): 10 mL

## 2023-10-26 MED ORDER — SODIUM CHLORIDE 0.9% FLUSH: 10.0000 mL | INTRAVENOUS | Status: DC | PRN

## 2023-10-26 MED ORDER — TRACE MINERALS CU-MN-SE-ZN 300-55-60-3000 MCG/ML IV SOLN
INTRAVENOUS | Status: AC
  Filled 2023-10-26: qty 1000

## 2023-10-26 MED ORDER — TRACE MINERALS CU-MN-SE-ZN 300-55-60-3000 MCG/ML IV SOLN
INTRAVENOUS | Status: DC
  Filled 2023-10-26: qty 1000

## 2023-10-26 NOTE — Progress Notes (Signed)
 Nutrition Follow Up Note   DOCUMENTATION CODES:   Not applicable  INTERVENTION:   [name] per pharmacy   Pt at high refeed risk; recommend monitor potassium, magnesium  and phosphorus labs daily until stable  Daily weights   Recommend thiamine  100mg  IV daily x 7 days   NUTRITION DIAGNOSIS:   Inadequate oral intake related to acute illness as evidenced by per patient/family report. -ongoing   GOAL:   Patient will meet greater than or equal to 90% of their needs -not met   MONITOR:   Diet advancement, Labs, Weight trends, I & O's, Skin, [name]   ASSESSMENT:   72 y/o male with h/o CAD s/p [name], DM, HTN, PAF, CHF, GERD and spinal stenosis s/p bilateral L3-4 and L4-5 laminotomy/foraminotomies 12/23 who is admitted with sepsis, AKI and suspected ileus.  Pt with no bowel function noted. Pressure requirements decreasing. Plan will be to initiate [name] today. Pt is at refeed risk. Pt remains weight stable.    Medications reviewed and include: heparin , insulin , synthroid , thiamine , levophed , zosyn   Labs reviewed: Na 133(L), K 4.2 wnl, BUN 96(H), creat 3.60(H), P 3.5 wnl, Mg 2.0 wnl Cbgs-  191, 205 x 24 hrs   Diet Order:   Diet Order             Diet NPO time specified  Diet effective now                  EDUCATION NEEDS:   No education needs have been identified at this time  Skin:  Skin Assessment: Reviewed RN Assessment (incision back)  Last BM:  pta  Height:   Ht Readings from Last 1 Encounters:  10/24/23 6' 4 (1.93 m)    Weight:   Wt Readings from Last 1 Encounters:  10/26/23 113.3 kg    Ideal Body Weight:  91.8 kg  BMI:  Body mass index is 30.4 kg/m.  Estimated Nutritional Needs:   Kcal:  2500-2800kcal/day  Protein:  125-140g/day  Fluid:  2.3-2.6L/day  Augustin Shams MS, RD, LDN If unable to be reached, please send secure chat to RD inpatient available from 8:00a-4:00p daily

## 2023-10-26 NOTE — Progress Notes (Signed)
 Per Dr A unable to place PICC. Orders entered for PICC team. A-line removed. Continue to assess.

## 2023-10-26 NOTE — Progress Notes (Signed)
*  PRELIMINARY RESULTS* Echocardiogram 2D Echocardiogram has been performed.  Jason [name] 10/26/2023, 9:59 AM

## 2023-10-26 NOTE — Progress Notes (Signed)
Dr. Mervyn Skeeters notified of patient NG tube placement per [facility]. Orders to pull it out and place back in. KUB ordered. Per MD he will place a PICC line for [name]. Continue to assess.

## 2023-10-26 NOTE — Progress Notes (Signed)
 Peripherally Inserted Central Catheter Placement  The IV Nurse has discussed with the patient and/or persons authorized to consent for the patient, the purpose of this procedure and the potential benefits and risks involved with this procedure.  The benefits include less needle sticks, lab draws from the catheter, and the patient may be discharged home with the catheter. Risks include, but not limited to, infection, bleeding, blood clot (thrombus formation), and puncture of an artery; nerve damage and irregular heartbeat and possibility to perform a PICC exchange if needed/ordered by physician.  Alternatives to this procedure were also discussed.  Bard Power PICC patient education guide, fact sheet on infection prevention and patient information card has been provided to patient /or left at bedside.    PICC Placement Documentation  PICC Triple Lumen 10/26/23 Right Basilic 42 cm 1 cm (Active)  Indication for Insertion or Continuance of Line Vasoactive infusions 10/26/23 1910  Exposed Catheter (cm) 0 cm 10/26/23 1910  Site Assessment Clean, Dry, Intact 10/26/23 1910  Lumen #1 Status Flushed;Saline locked;Blood return noted 10/26/23 1910  Lumen #2 Status Flushed;Saline locked;Blood return noted 10/26/23 1910  Lumen #3 Status Flushed;Saline locked;Blood return noted 10/26/23 1910  Dressing Type Transparent;Securing device 10/26/23 1910  Dressing Status Antimicrobial disc in place;Clean, Dry, Intact 10/26/23 1910  Line Care Connections checked and tightened 10/26/23 1910  Line Adjustment (NICU/IV Team Only) No 10/26/23 1910  Dressing Intervention New dressing;Adhesive placed at insertion site (IV team only);Other (Comment) 10/26/23 1910  Dressing Change Due 11/02/23 10/26/23 1910    Patient's spouse, Jason [name], signed PICC consent. Consent on chart.   [name] 10/26/2023, 7:11 PM

## 2023-10-26 NOTE — Consult Note (Signed)
 NAME:  Jason [name], MRN:  [id number], DOB:  [birth year], LOS: 2 ADMISSION DATE:  10/24/2023, CONSULTATION DATE:  10/25/23 REFERRING MD: Jawo, Modou NP CHIEF COMPLAINT: Generalized weakness   HPI  72 y.o with significant PMH of lumbar spinal stenosis L3-4, L4-5 laminotomy/foraminotomies, T2DM, PAF, systolic CHF EF 35% s/p ICD placement, CAD status post [name], HTN, HLD who presented to the ED with chief complaints of generalized weakness with associated nausea and vomiting since discharge from the hospital following admission for uncomplicated L3-4, L4-5 laminotomy and foraminotomies on 10/19/2023.   Past Medical History  Lumbar spinal stenosis L3-4, L4-5 laminotomy/foraminotomies, T2DM, PAF, systolic CHF EF 35% s/p ICD placement, CAD status post [name], HTN, HLD  Significant Hospital Events   12/29: Admitted to MedSurg unit with suspected postop ileus NG tube placed for decompression.  Overnight became hypoglycemic requiring dextrose  infusion 12/30: Rapid response called for unresponsiveness, hypoglycemia in the low 20s and hypertension (70/37).  Transferred to ICU PCCM consulted 10/26/23 -patient critically ill, on levophed , has not had nourishment in >1 wk.  Plan for PICC. Blood cultures pending, renal function improved but still severe AKI, repeat KUB today showed OGT got displaced to airway , this was replaced and repeated with repeat imaging showing good placement.   Consults:  PCCM  Procedures:  12/30 right femoral art line 12/30 right femoral central line  Significant Diagnostic Tests:  12/29: Chest Xray> IMPRESSION: 1. Minor left basilar atelectasis. Otherwise no acute cardiopulmonary findings. 2. Gaseous distension of bowel within the included upper abdomen.  12/30: Noncontrast CT head> IMPRESSION: No acute intracranial abnormality.  12/30: CTA abdomen and pelvis> IMPRESSION: 1. Findings favoring ileus with persistent air and fluid distension of the small bowel,  ascending, and transverse. There is no discrete transition point. Enteric tube decompresses the stomach with diminished gastric distension from yesterday. 2. No free air or mesenteric gas. 3. Progressive atelectasis in the medial left greater than right lower lobes. 4. Moderate-sized fat containing umbilical hernia.    Micro Data:  12/29: Blood culture x2> 12/30: MRSA PCR>>   Antimicrobials:  Azithromycin  12/29 Ceftriaxone  12/29  OBJECTIVE  Blood pressure (!) 134/57, pulse (!) 109, temperature 98.1 F (36.7 C), resp. rate (!) 21, height 6' 4 (1.93 m), weight 113.3 kg, SpO2 98%.       Intake/Output Summary (Last 24 hours) at 10/26/2023 1107 Last data filed at 10/26/2023 1044 Gross per 24 hour  Intake 2456.2 ml  Output 1250 ml  Net 1206.2 ml   Filed Weights   10/24/23 1155 10/26/23 0500  Weight: 113.3 kg 113.3 kg    Physical Examination  GENERAL: 72 year-old critically ill patient lying in the bed in no acute distress EYES: PEERLA. No scleral icterus. Extraocular muscles intact.  HEENT: Head atraumatic, normocephalic. Oropharynx and nasopharynx clear.  NECK:  No JVD, supple  LUNGS: Normal breath sounds bilaterally.  No use of accessory muscles of respiration.  CARDIOVASCULAR: S1, S2 normal. No murmurs, rubs, or gallops.  ABDOMEN: Soft, NTND EXTREMITIES: trace swelling.  Capillary refill < 3 seconds in all extremities. Pulses palpable distally. NEUROLOGIC: The patient is  oriented to self only. No focal neurological deficit appreciated. Cranial nerves are intact.  SKIN: No obvious rash, lesion, or ulcer. Warm to touch Labs/imaging that I havepersonally reviewed  (right click and Reselect all SmartList Selections daily)     Labs   CBC: Recent Labs  Lab 10/24/23 0700 10/25/23 0205 10/26/23 0419  WBC 11.1* 6.4 7.2  HGB 13.9 11.8* 11.4*  HCT 40.5 33.1* 32.8*  MCV 90.4 87.1 87.0  PLT 244 143* 131*    Basic Metabolic Panel: Recent Labs  Lab 10/24/23 0700  10/25/23 0205 10/25/23 1319 10/26/23 0419  NA 130* 130* 132* 133*  K 4.4 4.3 4.7 4.2  CL 93* 100 101 100  CO2 13* 18* 16* 21*  GLUCOSE 166* 81 189* 210*  BUN 89* 136* 98* 96*  CREATININE 3.81* 4.31* 3.83* 3.60*  CALCIUM  9.5 7.7* 7.9* 8.1*  MG  --   --   --  2.0  PHOS 4.2  --   --  3.5   GFR: Estimated Creatinine Clearance: 25.2 mL/min (A) (by C-G formula based on SCr of 3.6 mg/dL (H)). Recent Labs  Lab 10/24/23 0700 10/24/23 1232 10/24/23 1314 10/24/23 1620 10/25/23 0205 10/25/23 0821 10/26/23 0419  PROCALCITON  --   --  11.21  --  35.61  --  51.30  WBC 11.1*  --   --   --  6.4  --  7.2  LATICACIDVEN  --    < > 8.4* 6.1* 4.2* 3.1*  --    < > = values in this interval not displayed.    Liver Function Tests: Recent Labs  Lab 10/24/23 0700 10/25/23 0205 10/26/23 0419  AST 84* 227* 131*  ALT 118* 147* 125*  ALKPHOS 57 43 50  BILITOT 1.9* 0.8 1.0  PROT 7.2 5.3* 5.1*  ALBUMIN  3.6 2.7* 2.4*   No results for input(s): LIPASE, AMYLASE in the last 168 hours. No results for input(s): AMMONIA in the last 168 hours.  ABG    Component Value Date/Time   PHART 7.35 10/25/2023 0317   PCO2ART 27 (L) 10/25/2023 0317   PO2ART 75 (L) 10/25/2023 0317   HCO3 14.9 (L) 10/25/2023 0317   TCO2 26 04/15/2016 1707   ACIDBASEDEF 9.1 (H) 10/25/2023 0317   O2SAT 97.1 10/25/2023 0317     Coagulation Profile: No results for input(s): INR, PROTIME in the last 168 hours.  Cardiac Enzymes: No results for input(s): CKTOTAL, CKMB, CKMBINDEX, TROPONINI in the last 168 hours.  HbA1C: Hgb A1c MFr Bld  Date/Time Value Ref Range Status  10/13/2023 12:00 PM 5.8 (H) 4.8 - 5.6 % Final    Comment:    (NOTE) Pre diabetes:          5.7%-6.4%  Diabetes:              >6.4%  Glycemic control for   <7.0% adults with diabetes   04/13/2016 06:37 PM 7.6 (H) 4.8 - 5.6 % Final    Comment:    (NOTE)         Pre-diabetes: 5.7 - 6.4         Diabetes: >6.4         Glycemic  control for adults with diabetes: <7.0     CBG: Recent Labs  Lab 10/25/23 0832 10/25/23 1121 10/25/23 1608 10/25/23 2151 10/26/23 0801  GLUCAP 166* 183* 181* 209* 205*    Review of Systems:   Unable to be obtained secondary to the patient's intubated altered mental status.   Past Medical History  He,  has a past medical history of AICD (automatic cardioverter/defibrillator) present, Arthritis, CHF (congestive heart failure) (HCC), Coronary artery disease involving native coronary artery with unstable angina pectoris (HCC) (04/13/2016), Coronary artery disease with history of myocardial infarction without history of [name], Cough, Diabetes mellitus without complication (HCC), Edema, Essential hypertension, Hypertension, Hypothyroidism, Left main coronary artery disease (  04/13/2016), Mixed hyperlipidemia, Obesity, PAF (paroxysmal atrial fibrillation) (HCC), S/P [name] (04/14/2016), Type II diabetes mellitus (HCC), and Unstable angina (HCC) (04/13/2016).   Surgical History    Past Surgical History:  Procedure Laterality Date   BACK SURGERY     Lumbar Surgeries. [place]. Dr. Drury x2   CARDIAC CATHETERIZATION Left 04/13/2016   Procedure: Left Heart Cath and Coronary Angiography;  Surgeon: Wolm JINNY Rhyme, MD;  Location: [facility] INVASIVE [facility];  Service: Cardiovascular;  Laterality: Left;   CARDIOVERSION N/A 08/02/2018   Procedure: CARDIOVERSION;  Surgeon: Rhyme Wolm JINNY, MD;  Location: [facility] ORS;  Service: Cardiovascular;  Laterality: N/A;   CATARACT EXTRACTION W/PHACO Right 01/23/2016   Procedure: CATARACT EXTRACTION PHACO AND INTRAOCULAR LENS PLACEMENT (IOC);  Surgeon: Elsie Carmine, MD;  Location: [facility] ORS;  Service: Ophthalmology;  Laterality: Right;  US  01:18 AP% 23.8 CDE 18.67 fluid pack lot # [id number]   CHOLECYSTECTOMY     CORONARY ARTERY BYPASS GRAFT N/A 04/14/2016   Procedure: CORONARY ARTERY BYPASS GRAFTING TIMES FOUR    USING LEFT INTERNAL MAMMARY AND  ENDOSCOPIC HARVEST RIGHT SAPHENOUS VEIN;  Surgeon: Sudie VEAR Laine, MD;  Location: [facility] OR;  Service: Open Heart Surgery;  Laterality: N/A;   EYE SURGERY Bilateral    Cataract Extraction with IOL implant.   INTRAOPERATIVE TRANSESOPHAGEAL ECHOCARDIOGRAM N/A 04/14/2016   Procedure: INTRAOPERATIVE TRANSESOPHAGEAL ECHOCARDIOGRAM;  Surgeon: Sudie VEAR Laine, MD;  Location: [facility] OR;  Service: Open Heart Surgery;  Laterality: N/A;   KNEE ARTHROSCOPY Left 10/08/2015   Procedure: ARTHROSCOPY KNEE, PARTIAL MEDIAL AND LATERAL MENISECTOMY, REMOVAL OF FOREIGN BODY;  Surgeon: Ozell Flake, MD;  Location: [facility] ORS;  Service: Orthopedics;  Laterality: Left;   LUMBAR LAMINECTOMY/DECOMPRESSION MICRODISCECTOMY Bilateral 10/18/2023   Procedure: Lumbar Three-Four, Lumbar Four-Five  Laminotomy, Foraminotomy;  Surgeon: Mavis Purchase, MD;  Location: [facility] OR;  Service: Neurosurgery;  Laterality: Bilateral;  3C     Social History   reports that he has never smoked. His smokeless tobacco use includes chew. He reports that he does not drink alcohol and does not use drugs.   Family History   His family history includes Aortic stenosis in his father; Heart disease in his father; Hypertension in his father.   Allergies Allergies  Allergen Reactions   Aspirin Anaphylaxis   Inderal [Propranolol]     Other reaction(s): Other (See Comments) Fatigue   Metformin  Hcl Diarrhea    Pt still takes medication   Mevacor [Lovastatin] Other (See Comments)    muscle Pain   Penicillin G Hives   Pravachol [Pravastatin Sodium] Other (See Comments)    Muscle Pain   Zocor [Simvastatin] Other (See Comments)    Muscle Pain   Penicillins Rash    Has patient had a PCN reaction causing immediate rash, facial/tongue/throat swelling, SOB or lightheadedness with hypotension: Yes Has patient had a PCN reaction causing severe rash involving mucus membranes or skin necrosis: Yes Has patient had a PCN reaction that required hospitalization No Has  patient had a PCN reaction occurring within the last 10 years: Yes If all of the above answers are NO, then may proceed with Cephalosporin use.      Home Medications  Prior to Admission medications   Medication Sig Start Date End Date Taking? Authorizing Provider  acetaminophen  (TYLENOL ) 500 MG tablet Take 500-1,000 mg by mouth every 6 (six) hours as needed (pain.).    [provider]  amiodarone  (PACERONE ) 200 MG tablet Take 100 mg by mouth in the morning.  [provider]  apixaban  (ELIQUIS ) 5 MG TABS tablet Take 1 tablet (5 mg total) by mouth 2 (two) times daily. 10/25/23   Johnanna Credit Caylin, PA-C  cyclobenzaprine  (FLEXERIL ) 10 MG tablet Take 1 tablet (10 mg total) by mouth 3 (three) times daily as needed for muscle spasms. 10/19/23   Tomlinson, Sara Caylin, PA-C  dapagliflozin  propanediol (FARXIGA ) 10 MG TABS tablet Take 10 mg by mouth in the morning.    [provider]  docusate sodium  (COLACE) 100 MG capsule Take 1 capsule (100 mg total) by mouth 2 (two) times daily. 10/19/23 10/18/24  Johnanna Credit Caylin, PA-C  gabapentin  (NEURONTIN ) 100 MG capsule Take 100 mg by mouth at bedtime as needed (nerve pain.). 06/30/23   [provider]  glimepiride  (AMARYL ) 4 MG tablet Take 4 mg by mouth 2 (two) times daily. Reported on 04/13/2016    [provider]  levothyroxine  (SYNTHROID ) 50 MCG tablet Take 50 mcg by mouth daily at 2 am. 08/08/23   [provider]  metFORMIN  (GLUCOPHAGE ) 1000 MG tablet Take 1,000 mg by mouth in the morning and at bedtime.    [provider]  metoprolol  succinate (TOPROL -XL) 100 MG 24 hr tablet Take 100 mg by mouth in the morning. Take with or immediately following a meal.    [provider]  Oxycodone  HCl 10 MG TABS Take 1 tablet (10 mg total) by mouth every 4 (four) hours as needed for severe pain (pain score 7-10). 10/19/23   Johnanna Credit Caylin, PA-C  rosuvastatin  (CRESTOR ) 40 MG tablet  Take 20 mg by mouth at bedtime. 07/01/23   [provider]  sacubitril -valsartan  (ENTRESTO ) 49-51 MG Take 1 tablet by mouth 2 (two) times daily.    [provider]  torsemide  (DEMADEX ) 20 MG tablet Take 20 mg by mouth in the morning. 11/23/22   [provider]  Scheduled Meds:  amiodarone   100 mg Per Tube Daily   Chlorhexidine  Gluconate Cloth  6 each Topical Daily   glucagon  (human recombinant)  1 mg Intravenous Once   heparin   5,000 Units Subcutaneous Q8H   insulin  aspart  0-5 Units Subcutaneous QHS   insulin  aspart  0-9 Units Subcutaneous TID WC   levothyroxine   50 mcg Oral Q0600   Continuous Infusions:  norepinephrine  (LEVOPHED ) Adult infusion 6 mcg/min (10/26/23 1044)   piperacillin -tazobactam (ZOSYN )  IV Stopped (10/26/23 1022)   sodium bicarbonate  150 mEq in dextrose  5 % 1,150 mL infusion 75 mL/hr at 10/26/23 1044   vasopressin  Stopped (10/26/23 0400)   PRN Meds:.acetaminophen  **OR** acetaminophen , cyclobenzaprine , dextrose , hydrALAZINE , ondansetron  **OR** ondansetron  (ZOFRAN ) IV, mouth rinse   Active Hospital Problem list   See systems below  Assessment & Plan:  #Ileus Suspect post op related due to resent spinal surgery Repeat CT abd/pelvis ileus with persistent air and fluid distension of the small bowel, ascending, and transverse. -Keep Npo -Gentle IVFs -NGT for decompression -Surgery consult -Hold narcotics  #Sepsis due to Suspected Intra-abdominal source -F/u cultures, trend lactic/ PCT -Monitor WBC/ fever curve -IV antibiotics: ceftriaxone  and Azithromycin  -IVF hydration as needed -Pressors for MAP goal >65 -Strict I/O's  #AKI Likely ATN~improved  #Hyponatremia #NAGMA with Lactic Acidosis -Trend Lactate -Avoid nephrotoxic agents  -Follow BMP -Replace electrolytes as indicated -Nephrology consult   #Elevated LFTs -shock liver in the setting of severe sepsis with hypotension -Trend cmp  #Mildly Elevated Troponin, suspect demand  ischemia #HFrEF  EF 35% s/p ICD placement  #Hypertension PMHx: CAD s/p [name] X4, PAF, HLD BNP  mildly elevated without overt signs of volume overload or pulmonary edema -Trend HS Troponin until peaked -Hold torsemide , Entresto , metoprolol , Farxiga  and amiodarone   -Continue rosuvastatin  once able to tolerate p.o. -Hold Eliquis  for now -Obtain 2D echo  #T2 Diabetes mellitus  -Check hemoglobin A1c -CBGs -Sliding scale insulin  -Follow ICU hyper/hypoglycemia protocol -Hold metformin  and glimepiride    #Chronic Pain #Hx of Chronic Bilateral Low Back Pain  #hx of severe lumbar stenosis from L3-L5 with right lower extremity radiculopathy s/p  L3-4, L4-5 laminotomy and foraminotomies on 10/19/2023  -Follows with Neurosugeon  -on Gabapentin  and Flexeril , and nerve block PRN  #Hypothyroidism -Check TSH and free T4 -Continue home Synthroid   Best practice:  Diet:  NPO Pain/Anxiety/Delirium protocol (if indicated): No VAP protocol (if indicated): Not indicated DVT prophylaxis: Subcutaneous Heparin  GI prophylaxis: PPI Glucose control:  SSI No Central venous access:  Yes, and it is still needed Arterial line:  Yes, and it is still needed Foley:  N/A Mobility:  bed rest  PT consulted: N/A Last date of multidisciplinary goals of care discussion [updated wife over the phone] Code Status:  full code Disposition: ICU   = Goals of Care = Code Status Order: FULL  Primary Emergency Contact: Alcoser,Katie V, Home Phone: [phone] Wishes to pursue full aggressive treatment and intervention options, including CPR and intubation, but goals of care will be addressed on going with family if that should become necessary.  Critical care provider statement:   Total critical care time: 33 minutes   Performed by: Parris MD   Critical care time was exclusive of separately billable procedures and treating other patients.   Critical care was necessary to treat or prevent imminent or  life-threatening deterioration.   Critical care was time spent personally by me on the following activities: development of treatment plan with patient and/or surrogate as well as nursing, discussions with consultants, evaluation of patient's response to treatment, examination of patient, obtaining history from patient or surrogate, ordering and performing treatments and interventions, ordering and review of laboratory studies, ordering and review of radiographic studies, pulse oximetry and re-evaluation of patient's condition.    Amera Banos, M.D.  Pulmonary & Critical Care Medicine

## 2023-10-26 NOTE — Consult Note (Signed)
 PHARMACY - TOTAL PARENTERAL NUTRITION CONSULT NOTE   Indication: Prolonged ileus  Patient Measurements: Height: 6' 4 (193 cm) Weight: 113.3 kg (249 lb 12.5 oz) IBW/kg (Calculated) : 86.8 [name] AdjBW (KG): 93.4 Body mass index is 30.4 kg/m.  Assessment:   From HPI: 72 y/o M with history of CAD status post four-vessel [name] in 2017, hypertension, hyperlipidemia, non-insulin -dependent diabetes mellitus, history of lumbar stenosis status post spinal stenosis surgery on 10/18/2023.  Patient admitted with ileus and sepsis. Patient reportedly has not been eating since his surgery. Pharmacy consulted to initiate [name] for ileus.  Glucose / Insulin :  --Last Hgb A1c was 5.8% on 10/13/23 --BG 166 - 278 last 24h, 9u SSI required Electrolytes: Overall normal Renal: Patient presenting with AKI. Scr is down-trending Hepatic: Mild transaminitis. Albumin  low. Tbili normal Intake / Output; MIVF: + 3L for the admission. UOP 1.25L yesterday GI Imaging: 12/30 Abdominal x-ray: Dilated loops of gas-filled small bowel  GI Surgeries / Procedures: N/A  Central access: Pending PICC 12/31 [name] start date: 12/31 upon successful PICC placement  Nutritional Goals: Given shortage in products used for [name] compounding, parenteral nutrition will be provided to meet nutritional requirements as best as able based on current supply  RD Assessment: Estimated Needs Total Energy Estimated Needs: 2500-2800kcal/day Total Protein Estimated Needs: 125-140g/day Total Fluid Estimated Needs: 2.3-2.6L/day  Current Nutrition:  NPO  Plan:  --Give Clinimix  E 8/10 1L over 24h as continuous infusion and SMOFlipid  20% 250 mL over 12 hours --Electrolytes provided in standard concentrations as per Clinimix  E 8/10 formula --Add standard MVI and trace elements to [name] --Initiate Moderate q4h SSI and adjust as needed  --Monitor [name] labs on Mon/Thurs, daily until stable  Jason [name] 10/26/2023,11:59 AM

## 2023-10-26 NOTE — Progress Notes (Signed)
 Dr. Mervyn Skeeters notified of patients D-dimer 6.88. No additional orders. Continue to assess.

## 2023-10-26 NOTE — Plan of Care (Signed)
 Patient alert with intermittent confusion. Per radiology and Dr. DELENA replaced patients NG tube. Per MD, NG in place and orders for LIS. Foley intact with adequate urine output. Levophed  titrated throughout the day. Lidocaine  patch ordered for pain. Bicarb discontinued. Dr. DELENA will place patients PICC line for [name] and fat emulsion. Wife and family visited patient and MD gave them an update. Continue to assess.

## 2023-10-27 ENCOUNTER — Inpatient Hospital Stay: Payer: Medicare Other

## 2023-10-27 DIAGNOSIS — K567 Ileus, unspecified: Secondary | ICD-10-CM | POA: Diagnosis not present

## 2023-10-27 LAB — LACTIC ACID, PLASMA: Lactic Acid, Venous: 0.9 mmol/L (ref 0.5–1.9)

## 2023-10-27 LAB — COMPREHENSIVE METABOLIC PANEL
ALT: 95 U/L — ABNORMAL HIGH (ref 0–44)
AST: 78 U/L — ABNORMAL HIGH (ref 15–41)
Albumin: 2.2 g/dL — ABNORMAL LOW (ref 3.5–5.0)
Alkaline Phosphatase: 45 U/L (ref 38–126)
Anion gap: 12 (ref 5–15)
BUN: 103 mg/dL — ABNORMAL HIGH (ref 8–23)
CO2: 23 mmol/L (ref 22–32)
Calcium: 8.2 mg/dL — ABNORMAL LOW (ref 8.9–10.3)
Chloride: 103 mmol/L (ref 98–111)
Creatinine, Ser: 4.26 mg/dL — ABNORMAL HIGH (ref 0.61–1.24)
GFR, Estimated: 14 mL/min — ABNORMAL LOW (ref >60)
Glucose, Bld: 151 mg/dL — ABNORMAL HIGH (ref 70–99)
Potassium: 3.8 mmol/L (ref 3.5–5.1)
Sodium: 138 mmol/L (ref 135–145)
Total Bilirubin: 0.8 mg/dL (ref 0.0–1.2)
Total Protein: 4.8 g/dL — ABNORMAL LOW (ref 6.5–8.1)

## 2023-10-27 LAB — PROTIME-INR
INR: 1.2 (ref 0.8–1.2)
Prothrombin Time: 15 s (ref 11.4–15.2)

## 2023-10-27 LAB — HEPATITIS B CORE ANTIBODY, IGM: Hep B C IgM: NONREACTIVE

## 2023-10-27 LAB — MAGNESIUM: Magnesium: 2.3 mg/dL (ref 1.7–2.4)

## 2023-10-27 LAB — GLUCOSE, CAPILLARY
Glucose-Capillary: 172 mg/dL — ABNORMAL HIGH (ref 70–99)
Glucose-Capillary: 152 mg/dL — ABNORMAL HIGH (ref 70–99)
Glucose-Capillary: 179 mg/dL — ABNORMAL HIGH (ref 70–99)
Glucose-Capillary: 223 mg/dL — ABNORMAL HIGH (ref 70–99)
Glucose-Capillary: 225 mg/dL — ABNORMAL HIGH (ref 70–99)
Glucose-Capillary: 231 mg/dL — ABNORMAL HIGH (ref 70–99)

## 2023-10-27 LAB — CBC
HCT: 34.1 % — ABNORMAL LOW (ref 39.0–52.0)
Hemoglobin: 11.7 g/dL — ABNORMAL LOW (ref 13.0–17.0)
MCH: 31 pg (ref 26.0–34.0)
MCHC: 34.3 g/dL (ref 30.0–36.0)
MCV: 90.5 fL (ref 80.0–100.0)
Platelets: 129 10*3/uL — ABNORMAL LOW (ref 150–400)
RBC: 3.77 MIL/uL — ABNORMAL LOW (ref 4.22–5.81)
RDW: 13.3 % (ref 11.5–15.5)
WBC: 7.1 10*3/uL (ref 4.0–10.5)
nRBC: 0 % (ref 0.0–0.2)

## 2023-10-27 LAB — HEPATITIS C ANTIBODY: HCV Ab: NONREACTIVE

## 2023-10-27 LAB — PHOSPHORUS: Phosphorus: 3.2 mg/dL (ref 2.5–4.6)

## 2023-10-27 LAB — HEPATITIS B SURFACE ANTIGEN: Hepatitis B Surface Ag: NONREACTIVE

## 2023-10-27 LAB — LEGIONELLA PNEUMOPHILA SEROGP 1 UR AG: L. pneumophila Serogp 1 Ur Ag: NEGATIVE

## 2023-10-27 LAB — HEPATITIS B CORE ANTIBODY, TOTAL: Hep B Core Total Ab: NONREACTIVE

## 2023-10-27 LAB — APTT: aPTT: 29 s (ref 24–36)

## 2023-10-27 MED ORDER — FENTANYL CITRATE PF 50 MCG/ML IJ SOSY
25.0000 ug | PREFILLED_SYRINGE | INTRAMUSCULAR | Status: DC | PRN
  Administered 2023-10-27 – 2023-10-29 (×3): 25 ug via INTRAVENOUS
  Filled 2023-10-27 (×3): qty 1

## 2023-10-27 MED ORDER — STERILE WATER FOR INJECTION IJ SOLN
INTRAMUSCULAR | Status: AC
  Filled 2023-10-27: qty 10

## 2023-10-27 MED ORDER — ALTEPLASE 2 MG IJ SOLR
2.0000 mg | Freq: Once | INTRAMUSCULAR | Status: DC
  Filled 2023-10-27: qty 2

## 2023-10-27 MED ORDER — SENNOSIDES-DOCUSATE SODIUM 8.6-50 MG PO TABS: 1.0000 | ORAL_TABLET | Freq: Every evening | ORAL | Status: DC | PRN

## 2023-10-27 MED ORDER — FENTANYL CITRATE PF 50 MCG/ML IJ SOSY
PREFILLED_SYRINGE | INTRAMUSCULAR | Status: AC
  Administered 2023-10-27: 25 ug via INTRAVENOUS
  Filled 2023-10-27: qty 1

## 2023-10-27 MED ORDER — VASOPRESSIN 20 UNITS/100 ML INFUSION FOR SHOCK
0.0000 [IU]/min | INTRAVENOUS | Status: DC
  Administered 2023-10-27 – 2023-10-28 (×2): 0.03 [IU]/min via INTRAVENOUS
  Filled 2023-10-27 (×2): qty 100

## 2023-10-27 MED ORDER — AMIODARONE HCL IN DEXTROSE 360-4.14 MG/200ML-% IV SOLN
15.0000 mg/h | INTRAVENOUS | Status: DC
  Administered 2023-10-28 – 2023-10-29 (×3): 30 mg/h via INTRAVENOUS
  Administered 2023-10-29: 15 mg/h via INTRAVENOUS
  Filled 2023-10-27 (×4): qty 200

## 2023-10-27 MED ORDER — LACTATED RINGERS IV BOLUS
500.0000 mL | Freq: Once | INTRAVENOUS | Status: AC
  Administered 2023-10-27: 500 mL via INTRAVENOUS

## 2023-10-27 MED ORDER — LACTATED RINGERS IV SOLN: INTRAVENOUS | Status: DC

## 2023-10-27 MED ORDER — TRACE MINERALS CU-MN-SE-ZN 300-55-60-3000 MCG/ML IV SOLN
INTRAVENOUS | Status: AC
  Filled 2023-10-27: qty 1000

## 2023-10-27 MED ORDER — FAT EMUL FISH OIL/PLANT BASED 20% (SMOFLIPID)IV EMUL
250.0000 mL | INTRAVENOUS | Status: AC
  Administered 2023-10-27: 250 mL via INTRAVENOUS
  Filled 2023-10-27: qty 250

## 2023-10-27 MED ORDER — POLYETHYLENE GLYCOL 3350 17 G PO PACK: 17.0000 g | PACK | Freq: Every day | ORAL | Status: DC | PRN

## 2023-10-27 MED ORDER — LEVOTHYROXINE SODIUM 100 MCG/5ML IV SOLN: 37.5000 ug | Freq: Every day | INTRAVENOUS | Status: DC

## 2023-10-27 MED ORDER — HEPARIN BOLUS VIA INFUSION
4000.0000 [IU] | Freq: Once | INTRAVENOUS | Status: AC
  Administered 2023-10-27: 4000 [IU] via INTRAVENOUS
  Filled 2023-10-27: qty 4000

## 2023-10-27 MED ORDER — DEXTROSE-SODIUM CHLORIDE 5-0.45 % IV SOLN
INTRAVENOUS | Status: DC
Start: 2023-10-27 — End: 2023-10-27

## 2023-10-27 MED ORDER — LACTATED RINGERS IV BOLUS
500.0000 mL | Freq: Once | INTRAVENOUS | Status: AC
Start: 2023-10-27 — End: 2023-10-27
  Administered 2023-10-27: 500 mL via INTRAVENOUS

## 2023-10-27 MED ORDER — HEPARIN (PORCINE) 25000 UT/250ML-% IV SOLN
2550.0000 [IU]/h | INTRAVENOUS | Status: AC
  Administered 2023-10-27: 1550 [IU]/h via INTRAVENOUS
  Administered 2023-10-28: 1950 [IU]/h via INTRAVENOUS
  Administered 2023-10-28: 2150 [IU]/h via INTRAVENOUS
  Administered 2023-10-29: 2350 [IU]/h via INTRAVENOUS
  Administered 2023-10-29 – 2023-10-31 (×5): 2550 [IU]/h via INTRAVENOUS
  Filled 2023-10-27 (×9): qty 250

## 2023-10-27 MED ORDER — FENTANYL CITRATE PF 50 MCG/ML IJ SOSY: 25.0000 ug | PREFILLED_SYRINGE | Freq: Once | INTRAMUSCULAR | Status: AC

## 2023-10-27 MED ORDER — HEPARIN BOLUS VIA INFUSION: 5500.0000 [IU] | Freq: Once | INTRAVENOUS | Status: DC

## 2023-10-27 NOTE — Plan of Care (Signed)

## 2023-10-27 NOTE — Progress Notes (Signed)
 ANTICOAGULATION CONSULT NOTE  Pharmacy Consult for heparin  infusion Indication: atrial fibrillation  Allergies  Allergen Reactions   Aspirin Anaphylaxis   Inderal [Propranolol]     Other reaction(s): Other (See Comments) Fatigue   Metformin  Hcl Diarrhea    Pt still takes medication   Mevacor [Lovastatin] Other (See Comments)    muscle Pain   Penicillin G Hives   Pravachol [Pravastatin Sodium] Other (See Comments)    Muscle Pain   Zocor [Simvastatin] Other (See Comments)    Muscle Pain   Penicillins Rash    Has patient had a PCN reaction causing immediate rash, facial/tongue/throat swelling, SOB or lightheadedness with hypotension: Yes Has patient had a PCN reaction causing severe rash involving mucus membranes or skin necrosis: Yes Has patient had a PCN reaction that required hospitalization No Has patient had a PCN reaction occurring within the last 10 years: Yes If all of the above answers are NO, then may proceed with Cephalosporin use.     Patient Measurements: Height: 6' 4 (193 cm) Weight: 113.3 kg (249 lb 12.5 oz) IBW/kg (Calculated) : 86.8 Heparin  Dosing Weight: 109.9 kg  Vital Signs: BP: 85/45 (01/01 0930) Pulse Rate: 117 (01/01 1900)  Labs: Recent Labs    10/25/23 0204 10/25/23 0205 10/25/23 0205 10/25/23 1319 10/26/23 0419 10/27/23 0347  HGB  --  11.8*   < >  --  11.4* 11.7*  HCT  --  33.1*  --   --  32.8* 34.1*  PLT  --  143*  --   --  131* 129*  CREATININE  --  4.31*   < > 3.83* 3.60* 4.26*  TROPONINIHS 60*  --   --   --   --   --    < > = values in this interval not displayed.    Estimated Creatinine Clearance: 21.3 mL/min (A) (by C-G formula based on SCr of 4.26 mg/dL (H)).   Medical History: Past Medical History:  Diagnosis Date   AICD (automatic cardioverter/defibrillator) present    [employer]   Arthritis    CHF (congestive heart failure) (HCC)    Coronary artery disease involving native coronary artery with unstable angina pectoris  (HCC) 04/13/2016   Coronary artery disease with history of myocardial infarction without history of [name]    Dr. Quin   Cough    CHRONIC   Diabetes mellitus without complication (HCC)    Edema    FEET/LEGS   Essential hypertension    Hypertension    Hypothyroidism    Left main coronary artery disease 04/13/2016   Mixed hyperlipidemia    Obesity    PAF (paroxysmal atrial fibrillation) (HCC)    S/P [name] 04/14/2016   LIMA to LAD, SVG to D1, SVG to OM, SVG to PDA, EVH via right thigh and leg   Type II diabetes mellitus (HCC)    Unstable angina (HCC) 04/13/2016    Assessment: Pt is a 73 yo male admitted on 10/25/23 with ileus, with h/o A fib on Eliquis  5 mg BID.  Eliquis  not resumes since admission, pt ordered heparin  5000 units sub-Q q8hr starting 12/29, last dose 10/26/22 @ 1327.  Goal of Therapy:  Heparin  level 0.3-0.7 units/ml Monitor platelets by anticoagulation protocol: Yes   Plan:  Bolus 4000 units x 1 Start heparin  infusion at 1550 units/hr Will check HL in 8 hr after start of infusion CBC daily while on heparin   Rankin CANDIE Dills, PharmD, [facility] 10/27/2023 8:51 PM

## 2023-10-27 NOTE — Consult Note (Signed)
 [facility]  CONSULT NOTE    Date: 10/27/2023                  Patient Name:  Jason [name]  MRN: [id number]  DOB: 02-10-1951  Age / Sex: 73 y.o., male         PCP: Stephanie Charlene CROME, MD                 Service Requesting Consult: Dr. Parris                 Reason for Consult: Acute Kidney Injury            History of Present Illness: Jason [name] underwent spinal surgery on 10/18/23 by Dr. Mavis. Patient presents to [facility] with generalized weakness with nausea and vomiting. Patient found to have ileus and placed on bowel rest with [name] started.   Nephrology consulted for acute kidney injury. Baseline creatinine of 1.28 on 12/18. Creatinine on admission was 4.31 but then improved with supportive care to 3.6 before rising again today to 4.26.   Patient is confused and unable to give any history. No family at bedside.    Medications: Outpatient medications: Medications Prior to Admission  Medication Sig Dispense Refill Last Dose/Taking   acetaminophen  (TYLENOL ) 500 MG tablet Take 500-1,000 mg by mouth every 6 (six) hours as needed (pain.).      amiodarone  (PACERONE ) 200 MG tablet Take 100 mg by mouth in the morning.      apixaban  (ELIQUIS ) 5 MG TABS tablet Take 1 tablet (5 mg total) by mouth 2 (two) times daily.      cyclobenzaprine  (FLEXERIL ) 10 MG tablet Take 1 tablet (10 mg total) by mouth 3 (three) times daily as needed for muscle spasms. 90 tablet 0    dapagliflozin  propanediol (FARXIGA ) 10 MG TABS tablet Take 10 mg by mouth in the morning.      docusate sodium  (COLACE) 100 MG capsule Take 1 capsule (100 mg total) by mouth 2 (two) times daily. 60 capsule 0    gabapentin  (NEURONTIN ) 100 MG capsule Take 100 mg by mouth at bedtime as needed (nerve pain.).      glimepiride  (AMARYL ) 4 MG tablet Take 4 mg by mouth 2 (two) times daily. Reported on 04/13/2016      levothyroxine  (SYNTHROID ) 50 MCG tablet Take 50 mcg by mouth daily at 2 am.      metFORMIN   (GLUCOPHAGE ) 1000 MG tablet Take 1,000 mg by mouth in the morning and at bedtime.      metoprolol  succinate (TOPROL -XL) 100 MG 24 hr tablet Take 100 mg by mouth in the morning. Take with or immediately following a meal.      Oxycodone  HCl 10 MG TABS Take 1 tablet (10 mg total) by mouth every 4 (four) hours as needed for severe pain (pain score 7-10). 30 tablet 0    rosuvastatin  (CRESTOR ) 40 MG tablet Take 20 mg by mouth at bedtime.      sacubitril -valsartan  (ENTRESTO ) 49-51 MG Take 1 tablet by mouth 2 (two) times daily.      torsemide  (DEMADEX ) 20 MG tablet Take 20 mg by mouth in the morning.       Current medications: Current Facility-Administered Medications  Medication Dose Route Frequency Provider Last Rate Last Admin   acetaminophen  (TYLENOL ) tablet 650 mg  650 mg Oral Q6H PRN Cox, Amy N, DO       Or   acetaminophen  (TYLENOL ) suppository 650 mg  650 mg Rectal Q6H PRN Cox, Amy N, DO       amiodarone  (PACERONE ) tablet 100 mg  100 mg Per Tube Daily Clair Marolyn NOVAK, RPH       Chlorhexidine  Gluconate Cloth 2 % PADS 6 each  6 each Topical Daily Jawo, Modou L, NP   6 each at 10/26/23 0911   cyclobenzaprine  (FLEXERIL ) tablet 10 mg  10 mg Oral TID PRN Cox, Amy N, DO       dextrose  50 % solution 50 mL  1 ampule Intravenous PRN Cox, Amy N, DO   50 mL at 10/25/23 0100   heparin  injection 5,000 Units  5,000 Units Subcutaneous Q8H Cox, Amy N, DO   5,000 Units at 10/27/23 0502   hydrALAZINE  (APRESOLINE ) injection 5 mg  5 mg Intravenous Q6H PRN Cox, Amy N, DO       insulin  aspart (novoLOG ) injection 0-15 Units  0-15 Units Subcutaneous Q4H Aleskerov, Fuad, MD   3 Units at 10/27/23 0325   levothyroxine  (SYNTHROID ) tablet 50 mcg  50 mcg Oral Q0600 Cox, Amy N, DO       lidocaine  (LIDODERM ) 5 % 1 patch  1 patch Transdermal Q24H Aleskerov, Fuad, MD   1 patch at 10/26/23 1412   norepinephrine  (LEVOPHED ) 16 mg in 250mL (0.064 mg/mL) premix infusion  0-40 mcg/min Intravenous Titrated Ouma, Elizabeth Achieng, NP  3.75 mL/hr at 10/27/23 0700 4 mcg/min at 10/27/23 0700   ondansetron  (ZOFRAN ) tablet 4 mg  4 mg Oral Q6H PRN Cox, Amy N, DO       Or   ondansetron  (ZOFRAN ) injection 4 mg  4 mg Intravenous Q6H PRN Cox, Amy N, DO       Oral care mouth rinse  15 mL Mouth Rinse PRN Aleskerov, Fuad, MD       piperacillin -tazobactam (ZOSYN ) IVPB 3.375 g  3.375 g Intravenous Q8H Clair Marolyn NOVAK, RPH   Stopped at 10/27/23 0602   sodium chloride  flush (NS) 0.9 % injection 10-40 mL  10-40 mL Intracatheter Q12H Parris Manna, MD   10 mL at 10/26/23 2206   sodium chloride  flush (NS) 0.9 % injection 10-40 mL  10-40 mL Intracatheter PRN Aleskerov, Fuad, MD       thiamine  (VITAMIN B1) injection 100 mg  100 mg Intravenous Q24H Aleskerov, Fuad, MD       [name] (CLINIMIX -E) Adult   Intravenous Continuous [name] Clair Marolyn NOVAK, RPH 42 mL/hr at 10/27/23 0700 Infusion Verify at 10/27/23 0700      Allergies: Allergies  Allergen Reactions   Aspirin Anaphylaxis   Inderal [Propranolol]     Other reaction(s): Other (See Comments) Fatigue   Metformin  Hcl Diarrhea    Pt still takes medication   Mevacor [Lovastatin] Other (See Comments)    muscle Pain   Penicillin G Hives   Pravachol [Pravastatin Sodium] Other (See Comments)    Muscle Pain   Zocor [Simvastatin] Other (See Comments)    Muscle Pain   Penicillins Rash    Has patient had a PCN reaction causing immediate rash, facial/tongue/throat swelling, SOB or lightheadedness with hypotension: Yes Has patient had a PCN reaction causing severe rash involving mucus membranes or skin necrosis: Yes Has patient had a PCN reaction that required hospitalization No Has patient had a PCN reaction occurring within the last 10 years: Yes If all of the above answers are NO, then may proceed with Cephalosporin use.       Past Medical History: Past Medical History:  Diagnosis Date  AICD (automatic cardioverter/defibrillator) present    [employer]   Arthritis    CHF (congestive  heart failure) (HCC)    Coronary artery disease involving native coronary artery with unstable angina pectoris (HCC) 04/13/2016   Coronary artery disease with history of myocardial infarction without history of [name]    Dr. 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 [place]. Dr. Drury x2   CARDIAC CATHETERIZATION Left 04/13/2016   Procedure: Left Heart Cath and Coronary Angiography;  Surgeon: Wolm JINNY Rhyme, MD;  Location: [facility] INVASIVE CV LAB;  Service: Cardiovascular;  Laterality: Left;   CARDIOVERSION N/A 08/02/2018   Procedure: CARDIOVERSION;  Surgeon: Rhyme Wolm JINNY, MD;  Location: [facility] ORS;  Service: Cardiovascular;  Laterality: N/A;   CATARACT EXTRACTION W/PHACO Right 01/23/2016   Procedure: CATARACT EXTRACTION PHACO AND INTRAOCULAR LENS PLACEMENT (IOC);  Surgeon: Elsie Carmine, MD;  Location: [facility] ORS;  Service: Ophthalmology;  Laterality: Right;  US  01:18 AP% 23.8 CDE 18.67 fluid pack lot # [id number]   CHOLECYSTECTOMY     CORONARY ARTERY BYPASS GRAFT N/A 04/14/2016   Procedure: CORONARY ARTERY BYPASS GRAFTING TIMES FOUR    USING LEFT INTERNAL MAMMARY AND ENDOSCOPIC HARVEST RIGHT SAPHENOUS VEIN;  Surgeon: Sudie VEAR Laine, MD;  Location: [facility] OR;  Service: Open Heart Surgery;  Laterality: N/A;   EYE SURGERY Bilateral    Cataract Extraction with IOL implant.   INTRAOPERATIVE TRANSESOPHAGEAL ECHOCARDIOGRAM N/A 04/14/2016   Procedure:  INTRAOPERATIVE TRANSESOPHAGEAL ECHOCARDIOGRAM;  Surgeon: Sudie VEAR Laine, MD;  [facility];  Service: Open Heart Surgery;  Laterality: N/A;   KNEE ARTHROSCOPY Left 10/08/2015   Procedure: ARTHROSCOPY KNEE, PARTIAL MEDIAL AND LATERAL MENISECTOMY, REMOVAL OF FOREIGN BODY;  Surgeon: Ozell Flake, MD;  Location: [facility] ORS;  Service: Orthopedics;  Laterality: Left;   LUMBAR LAMINECTOMY/DECOMPRESSION MICRODISCECTOMY Bilateral 10/18/2023   Procedure: Lumbar Three-Four, Lumbar Four-Five  Laminotomy, Foraminotomy;  Surgeon: Mavis Purchase, MD;  Location[facility] OR;  Service: Neurosurgery;  Laterality: Bilateral;  3C     Family History: Family History  Problem Relation Age of Onset   Hypertension Father    Aortic stenosis Father    Heart disease Father      Social History: Social History   Socioeconomic History   Marital status: Married    Spouse name: Katie   Number of children: 0   Years of education: Not on file   Highest education level: Not on file  Occupational History   Not on file  Tobacco Use   Smoking status: Never   Smokeless tobacco: Current    Types: Chew   Tobacco comments:    Daily chew  Vaping Use   Vaping status: Never Used  Substance and Sexual Activity   Alcohol use: No   Drug use: No   Sexual activity: Not Currently  Other Topics  Concern   Not on file  Social History Narrative   Not on file   Social Drivers of Health   Financial Resource Strain: Low Risk  (08/02/2018)   Overall Financial Resource Strain (CARDIA)    Difficulty of Paying Living Expenses: Not very hard  Food Insecurity: No Food Insecurity (10/24/2023)   Hunger Vital Sign    Worried About Running Out of Food in the Last Year: Never true    Ran Out of Food in the Last Year: Never true  Transportation Needs: No Transportation Needs (10/24/2023)   PRAPARE - Administrator, Civil Service (Medical): No    Lack of Transportation (Non-Medical): No  Physical Activity: Unknown  (08/02/2018)   Exercise Vital Sign    Days of Exercise per Week: 0 days    Minutes of Exercise per Session: Not on file  Stress: No Stress Concern Present (08/02/2018)   [employer] of Occupational Health - Occupational Stress Questionnaire    Feeling of Stress : Only a little  Social Connections: Unknown (08/02/2018)   Social Connection and Isolation Panel [NHANES]    Frequency of Communication with Friends and Family: Three times a week    Frequency of Social Gatherings with Friends and Family: Twice a week    Attends Religious Services: More than 4 times per year    Active Member of [employer] or Organizations: Not on file    Attends Banker Meetings: Not on file    Marital Status: Not on file  Intimate Partner Violence: Not At Risk (10/24/2023)   Humiliation, Afraid, Rape, and Kick questionnaire    Fear of Current or Ex-Partner: No    Emotionally Abused: No    Physically Abused: No    Sexually Abused: No       Vital Signs: Blood pressure (!) 86/56, pulse (!) 105, temperature 98.2 F (36.8 C), temperature source Oral, resp. rate (!) 24, height 6' 4 (1.93 m), weight 113.3 kg, SpO2 93%.  Weight trends: Filed Weights   10/24/23 1155 10/26/23 0500 10/27/23 0500  Weight: 113.3 kg 113.3 kg 113.3 kg    Physical Exam: General: NAD, laying in bed  Head: Normocephalic, atraumatic. Moist oral mucosal membranes  Eyes: Anicteric, PERRL  Neck: Supple, trachea midline  Lungs:  Clear to auscultation  Heart: Regular rate and rhythm  Abdomen:  Soft, nontender, +umbilical hernia  Extremities:  no peripheral edema.  Neurologic: confused  Skin: No lesions  Access: none     Lab results: Basic Metabolic Panel: Recent Labs  Lab 10/24/23 0700 10/25/23 0205 10/25/23 1319 10/26/23 0419 10/27/23 0347  NA 130*   < > 132* 133* 138  K 4.4   < > 4.7 4.2 3.8  CL 93*   < > 101 100 103  CO2 13*   < > 16* 21* 23  GLUCOSE 166*   < > 189* 210* 151*  BUN 89*   < > 98* 96* 103*   CREATININE 3.81*   < > 3.83* 3.60* 4.26*  CALCIUM  9.5   < > 7.9* 8.1* 8.2*  MG  --   --   --  2.0 2.3  PHOS 4.2  --   --  3.5 3.2   < > = values in this interval not displayed.    Liver Function Tests: Recent Labs  Lab 10/25/23 0205 10/26/23 0419 10/27/23 0347  AST 227* 131* 78*  ALT 147* 125* 95*  ALKPHOS 43 50 45  BILITOT 0.8 1.0 0.8  PROT  5.3* 5.1* 4.8*  ALBUMIN  2.7* 2.4* 2.2*   Recent Labs  Lab 10/26/23 1237  LIPASE 19   No results for input(s): AMMONIA in the last 168 hours.  CBC: Recent Labs  Lab 10/24/23 0700 10/25/23 0205 10/26/23 0419 10/27/23 0347  WBC 11.1* 6.4 7.2 7.1  HGB 13.9 11.8* 11.4* 11.7*  HCT 40.5 33.1* 32.8* 34.1*  MCV 90.4 87.1 87.0 90.5  PLT 244 143* 131* 129*    Cardiac Enzymes: No results for input(s): CKTOTAL, CKMB, CKMBINDEX, TROPONINI in the last 168 hours.  BNP: Invalid input(s): POCBNP  CBG: Recent Labs  Lab 10/26/23 2126 10/26/23 2200 10/26/23 2327 10/27/23 0317 10/27/23 0725  GLUCAP 148* 151* 152* 172* 152*    Microbiology: Results for orders placed or performed during the hospital encounter of 10/24/23  Blood culture (routine x 2)     Status: None (Preliminary result)   Collection Time: 10/24/23  1:14 PM   Specimen: BLOOD  Result Value Ref Range Status   Specimen Description BLOOD BLOOD LEFT HAND  Final   Special Requests   Final    BOTTLES DRAWN AEROBIC AND ANAEROBIC Blood Culture adequate volume   Culture   Final    NO GROWTH 3 DAYS Performed at [facility], [address] [place], [place] [address]    Report Status PENDING  Incomplete  Blood culture (routine x 2)     Status: None (Preliminary result)   Collection Time: 10/24/23  8:20 PM   Specimen: BLOOD LEFT HAND  Result Value Ref Range Status   Specimen Description BLOOD LEFT HAND  Final   Special Requests   Final    BOTTLES DRAWN AEROBIC AND ANAEROBIC Blood Culture results may not be optimal due to an inadequate volume of  blood received in culture bottles   Culture   Final    NO GROWTH 3 DAYS Performed at [facility], [address] [place], [place] [address]    Report Status PENDING  Incomplete  MRSA Next Gen by PCR, Nasal     Status: None   Collection Time: 10/25/23  2:05 AM   Specimen: Nasal Mucosa; Nasal Swab  Result Value Ref Range Status   MRSA by PCR Next Gen NOT DETECTED NOT DETECTED Final    Comment: (NOTE) The GeneXpert MRSA Assay (FDA approved for NASAL specimens only), is one component of a comprehensive MRSA colonization surveillance program. It is not intended to diagnose MRSA infection nor to guide or monitor treatment for MRSA infections. Test performance is not FDA approved in patients less than 49 years old. Performed at [facility], [address] [place], [place] [address]   SARS Coronavirus 2 by RT PCR (hospital order, performed in [facility] hospital lab) *cepheid single result test* Anterior Nasal Swab     Status: None   Collection Time: 10/25/23  8:37 AM   Specimen: Anterior Nasal Swab  Result Value Ref Range Status   SARS Coronavirus 2 by RT PCR NEGATIVE NEGATIVE Final    Comment: (NOTE) SARS-CoV-2 target nucleic acids are NOT DETECTED.  The SARS-CoV-2 RNA is generally detectable in upper and lower respiratory specimens during the acute phase of infection. The lowest concentration of SARS-CoV-2 viral copies this assay can detect is 250 copies / mL. A negative result does not preclude SARS-CoV-2 infection and should not be used as the sole basis for treatment or other patient management decisions.  A negative result may occur with improper specimen collection / handling, submission of specimen  other than nasopharyngeal swab, presence of viral mutation(s) within the areas targeted by this assay, and inadequate number of viral copies (<250 copies / mL). A negative result must be combined with clinical observations, patient history, and  epidemiological information.  Fact Sheet for Patients:   roadlaptop.co.za  Fact Sheet for Healthcare Providers: http://kim-miller.com/  This test is not yet approved or  cleared by the [place]  FDA and has been authorized for detection and/or diagnosis of SARS-CoV-2 by FDA under an Emergency Use Authorization (EUA).  This EUA will remain in effect (meaning this test can be used) for the duration of the COVID-19 declaration under Section 564(b)(1) of the Act, 21 U.S.C. section 360bbb-3(b)(1), unless the authorization is terminated or revoked sooner.  Performed at [facility], [address] [place], [place] [address]   Respiratory (~20 pathogens) panel by PCR     Status: None   Collection Time: 10/25/23  8:37 AM   Specimen: Nasopharyngeal Swab; Respiratory  Result Value Ref Range Status   Adenovirus NOT DETECTED NOT DETECTED Final   Coronavirus 229E NOT DETECTED NOT DETECTED Final    Comment: (NOTE) The Coronavirus on the Respiratory Panel, DOES NOT test for the novel  Coronavirus (2019 nCoV)    Coronavirus HKU1 NOT DETECTED NOT DETECTED Final   Coronavirus NL63 NOT DETECTED NOT DETECTED Final   Coronavirus OC43 NOT DETECTED NOT DETECTED Final   Metapneumovirus NOT DETECTED NOT DETECTED Final   Rhinovirus / Enterovirus NOT DETECTED NOT DETECTED Final   Influenza A NOT DETECTED NOT DETECTED Final   Influenza B NOT DETECTED NOT DETECTED Final   Parainfluenza Virus 1 NOT DETECTED NOT DETECTED Final   Parainfluenza Virus 2 NOT DETECTED NOT DETECTED Final   Parainfluenza Virus 3 NOT DETECTED NOT DETECTED Final   Parainfluenza Virus 4 NOT DETECTED NOT DETECTED Final   Respiratory Syncytial Virus NOT DETECTED NOT DETECTED Final   Bordetella pertussis NOT DETECTED NOT DETECTED Final   Bordetella Parapertussis NOT DETECTED NOT DETECTED Final   Chlamydophila pneumoniae NOT DETECTED NOT DETECTED Final   Mycoplasma pneumoniae  NOT DETECTED NOT DETECTED Final    Comment: Performed at [facility], [address] [place], [place] [address]    Coagulation Studies: No results for input(s): LABPROT, INR in the last 72 hours.  Urinalysis: Recent Labs    10/25/23 0317  COLORURINE AMBER*  LABSPEC 1.024  PHURINE 5.0  GLUCOSEU 150*  HGBUR SMALL*  BILIRUBINUR NEGATIVE  KETONESUR NEGATIVE  PROTEINUR 30*  NITRITE NEGATIVE  LEUKOCYTESUR NEGATIVE      Imaging: DG Abd 1 View Result Date: 10/26/2023 CLINICAL DATA:  Check gastric catheter placement EXAM: ABDOMEN - 1 VIEW COMPARISON:  None Available. FINDINGS: Gastric catheter is noted with the tip in the stomach. Stomach is distended with air. No free air is seen. No bony abnormality is noted. IMPRESSION: Gastric catheter within the stomach. Electronically Signed   By: Oneil Devonshire M.D.   On: 10/26/2023 18:42   US  EKG SITE RITE Result Date: 10/26/2023 If Site Rite image not attached, placement could not be confirmed due to current cardiac rhythm.  ECHOCARDIOGRAM COMPLETE Result Date: 10/26/2023    ECHOCARDIOGRAM REPORT   Patient Name:   Jason [name] Date of Exam: 10/26/2023 Medical Rec #:  [id number]      Height:       76.0 in Accession #:    [id number]     Weight:       249.8 lb Date of Birth:  1951-01-14       BSA:          2.435 m Patient Age:    72 years       BP:           134/57 mmHg Patient Gender: M              HR:           111 bpm. Exam Location:  [facility] Procedure: 2D Echo, Cardiac Doppler and Color Doppler Indications:     Shock  History:         Patient has no prior history of Echocardiogram examinations.                  CAD, Prior [name]; Risk Factors:Hypertension, Diabetes and                  Dyslipidemia. Shock.  Sonographer:     Naomie Reef Referring Phys:  [id number] LONELL KANDICE MOOSE Diagnosing Phys: Redell Cave MD  Sonographer Comments: Technically difficult study due to poor echo windows and no subcostal window. IMPRESSIONS  1. Left  ventricular ejection fraction, by estimation, is 50 to 55%. Left ventricular ejection fraction by PLAX is 54 %. The left ventricle has low normal function. The left ventricle has no regional wall motion abnormalities. There is mild left ventricular hypertrophy. Left ventricular diastolic parameters are indeterminate.  2. Right ventricular systolic function is normal. The right ventricular size is normal.  3. The mitral valve is degenerative. No evidence of mitral valve regurgitation.  4. The aortic valve is tricuspid. Aortic valve regurgitation is not visualized. Aortic valve sclerosis is present, with no evidence of aortic valve stenosis.  5. Aortic dilatation noted. There is borderline dilatation of the aortic root, measuring 38 mm. FINDINGS  Left Ventricle: Left ventricular ejection fraction, by estimation, is 50 to 55%. Left ventricular ejection fraction by PLAX is 54 %. The left ventricle has low normal function. The left ventricle has no regional wall motion abnormalities. The left ventricular internal cavity size was normal in size. There is mild left ventricular hypertrophy. Left ventricular diastolic parameters are indeterminate. Right Ventricle: The right ventricular size is normal. No increase in right ventricular wall thickness. Right ventricular systolic function is normal. Left Atrium: Left atrial size was normal in size. Right Atrium: Right atrial size was normal in size. Pericardium: There is no evidence of pericardial effusion. Mitral Valve: The mitral valve is degenerative in appearance. No evidence of mitral valve regurgitation. MV peak gradient, 8.3 mmHg. The mean mitral valve gradient is 4.0 mmHg. Tricuspid Valve: The tricuspid valve is normal in structure. Tricuspid valve regurgitation is not demonstrated. Aortic Valve: The aortic valve is tricuspid. Aortic valve regurgitation is not visualized. Aortic valve sclerosis is present, with no evidence of aortic valve stenosis. Aortic valve mean  gradient measures 4.3 mmHg. Aortic valve peak gradient measures 7.8  mmHg. Aortic valve area, by VTI measures 2.56 cm. Pulmonic Valve: The pulmonic valve was normal in structure. Pulmonic valve regurgitation is not visualized. Aorta: Aortic dilatation noted. There is borderline dilatation of the aortic root, measuring 38 mm. Venous: The inferior vena cava was not well visualized. IAS/Shunts: No atrial level shunt detected by color flow Doppler. Additional Comments: A device lead is visualized.  LEFT VENTRICLE PLAX 2D LV EF:         Left            Diastology  ventricular     LV e' lateral:   9.25 cm/s                ejection        LV E/e' lateral: 14.4                fraction by                PLAX is 54                %. LVIDd:         3.70 cm LVIDs:         2.70 cm LV PW:         1.10 cm LV IVS:        1.30 cm LVOT diam:     2.00 cm LV SV:         57 LV SV Index:   23 LVOT Area:     3.14 cm  RIGHT VENTRICLE RV Basal diam:  3.80 cm RV Mid diam:    3.40 cm RV S prime:     10.90 cm/s LEFT ATRIUM             Index        RIGHT ATRIUM           Index LA diam:        3.80 cm 1.56 cm/m   RA Area:     17.20 cm LA Vol (A2C):   50.4 ml 20.70 ml/m  RA Volume:   47.00 ml  19.30 ml/m LA Vol (A4C):   81.6 ml 33.52 ml/m LA Biplane Vol: 65.0 ml 26.70 ml/m  AORTIC VALVE                    PULMONIC VALVE AV Area (Vmax):    2.27 cm     PV Vmax:       1.09 m/s AV Area (Vmean):   2.06 cm     PV Peak grad:  4.8 mmHg AV Area (VTI):     2.56 cm AV Vmax:           140.00 cm/s AV Vmean:          98.233 cm/s AV VTI:            0.221 m AV Peak Grad:      7.8 mmHg AV Mean Grad:      4.3 mmHg LVOT Vmax:         101.00 cm/s LVOT Vmean:        64.400 cm/s LVOT VTI:          0.180 m LVOT/AV VTI ratio: 0.82  AORTA Ao Root diam: 3.80 cm MITRAL VALVE MV Area (PHT): 7.59 cm     SHUNTS MV Area VTI:   2.58 cm     Systemic VTI:  0.18 m MV Peak grad:  8.3 mmHg     Systemic Diam: 2.00 cm MV Mean grad:  4.0 mmHg MV Vmax:        1.44 m/s MV Vmean:      88.0 cm/s MV Decel Time: 100 msec MV E velocity: 133.00 cm/s Redell Cave MD Electronically signed by Redell Cave MD Signature Date/Time: 10/26/2023/3:06:10 PM    Final    DG Abd 1 View Result Date: 10/26/2023 CLINICAL DATA:  Ileus EXAM: ABDOMEN - 1 VIEW COMPARISON:  10/25/2023; 10/24/2023; CT abdomen pelvis-10/25/2023 FINDINGS: Malpositioned nasogastric tube with tip and side port projected over  the right lower lobe bronchus. Marked gaseous distention of the stomach as well as several loops of patulous appearing small bowel with index loop of small bowel within the right lower abdominal quadrant measuring 6.3 cm in diameter. The site of the associated with moderate gaseous distention of the colon. No pneumoperitoneum, pneumatosis or portal venous gas Limited visualization of the lower thorax demonstrates sequela of previous median sternotomy. Pacer leads terminate over the right atrium and ventricle. Postcholecystectomy. 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 IMPRESSION: 1. Malpositioned nasogastric tube with tip and side port projected over the right lower lobe bronchus. Repositioning is advised. 2. Marked gaseous distention of the stomach as well as several loops of patulous appearing small bowel. While distal colonic gaseous identified, partial small bowel obstruction is not excluded on the basis of this examination. Continued attention on follow-up is advised. These results will be called to the ordering clinician or representative by the Radiologist Assistant, and communication documented in the PACS or [employer]. Electronically Signed   By: Norleen Roulette M.D.   On: 10/26/2023 14:55   US  Venous Img Lower Bilateral (DVT) Result Date: 10/26/2023 CLINICAL DATA:  Bilateral lower extremity pain and edema. Evaluate for DVT. EXAM: BILATERAL LOWER EXTREMITY VENOUS DOPPLER ULTRASOUND  TECHNIQUE: Gray-scale sonography with graded compression, as well as color Doppler and duplex ultrasound were performed to evaluate the lower extremity deep venous systems from the level of the common femoral vein and including the common femoral, femoral, profunda femoral, popliteal and calf veins including the posterior tibial, peroneal and gastrocnemius veins when visible. The superficial great saphenous vein was also interrogated. Spectral Doppler was utilized to evaluate flow at rest and with distal augmentation maneuvers in the common femoral, femoral and popliteal veins. COMPARISON:  None Available. FINDINGS: RIGHT LOWER EXTREMITY Common Femoral Vein: No evidence of thrombus. Normal compressibility, respiratory phasicity and response to augmentation. Saphenofemoral Junction: No evidence of thrombus. Normal compressibility and flow on color Doppler imaging. Profunda Femoral Vein: No evidence of thrombus. Normal compressibility and flow on color Doppler imaging. Femoral Vein: No evidence of thrombus. Normal compressibility, respiratory phasicity and response to augmentation. Popliteal Vein: No evidence of thrombus. Normal compressibility, respiratory phasicity and response to augmentation. Calf Veins: No evidence of thrombus. Normal compressibility and flow on color Doppler imaging. Superficial Great Saphenous Vein: No evidence of thrombus. Normal compressibility. Other Findings:  None. LEFT LOWER EXTREMITY Common Femoral Vein: No evidence of thrombus. Normal compressibility, respiratory phasicity and response to augmentation. Saphenofemoral Junction: No evidence of thrombus. Normal compressibility and flow on color Doppler imaging. Profunda Femoral Vein: No evidence of thrombus. Normal compressibility and flow on color Doppler imaging. Femoral Vein: No evidence of thrombus. Normal compressibility, respiratory phasicity and response to augmentation. Popliteal Vein: No evidence of thrombus. Normal  compressibility, respiratory phasicity and response to augmentation. Calf Veins: No evidence of thrombus. Normal compressibility and flow on color Doppler imaging. Superficial Great Saphenous Vein: No evidence of thrombus. Normal compressibility. Other Findings:  None. IMPRESSION: No evidence of DVT within either lower extremity. Electronically Signed   By: Norleen Roulette M.D.   On: 10/26/2023 14:51   DG Abd 1 View Result Date: 10/25/2023 CLINICAL DATA:  Encounter for nasogastric tube placement EXAM: ABDOMEN - 1 VIEW COMPARISON:  None Available. FINDINGS: NG tube coiled within the gastric fundus. Side port is below the GE junction. Dilated loops of small bowel again noted. No significant improvement. LEFT basilar atelectasis. IMPRESSION:  1. NG tube in stomach. 2. Dilated loops of gas-filled small bowel. Electronically Signed   By: Jackquline Boxer M.D.   On: 10/25/2023 15:58     Assessment & Plan: [name] is a 73 y.o.  male with coronary artery disease status post [name], hypertension, hyperlipidemia, diabetes mellitus type II noninsulin dependent, who was admitted to [facility] on 10/24/2023 for Ileus [facility]) [K56.7] Status post lumbar stenosis surgery on 10/18/23.  Acute Kidney Injury: baseline creatinine of 1.28, GFR of 59. NO IV contrast exposure. Nonoliguric urine output. Suspect some underlying diabetic nephropathy with glycosuria and proteinuria. Differential includes prerenal azotemia and/or ATN.  - holding Entresto  - holding torsemide  - Holding metformin  - holding dapagliflozin  - no acute indication for dialysis - will start gentle IV fluids: D5 with 1/2NS at 50mL/hr - Continue [name] - monitor volume status, urine output, serum electrolytes and renal function.  - NO acute indication for dialysis at this time.   Hypotension with history of essential hypertension: secondary to volume depletion. Home regimen of metoprolol , Entresto , torsemide .  - holding all blood pressure agents.  -  requiring vasopressor: norepinephrine  gtt  Diabetes mellitus type II noninsulin dependent with renal manifestations. Hemoglobin A1c of 5.8%.  - holding metformin  and dapagliflozin  as above.  - sliding scale.      LOS: 3 Gwendolynn Merkey 1/1/20258:02 AM

## 2023-10-27 NOTE — Progress Notes (Signed)
 NAME:  Jason [name], MRN:  [id number], DOB:  [birth year], LOS: 3 ADMISSION DATE:  10/24/2023, CONSULTATION DATE:  10/25/23 REFERRING MD: Jawo, Modou NP CHIEF COMPLAINT: Generalized weakness   HPI  73 y.o with significant PMH of lumbar spinal stenosis L3-4, L4-5 laminotomy/foraminotomies, T2DM, PAF, systolic CHF EF 35% s/p ICD placement, CAD status post [name], HTN, HLD who presented to the ED with chief complaints of generalized weakness with associated nausea and vomiting since discharge from the hospital following admission for uncomplicated L3-4, L4-5 laminotomy and foraminotomies on 10/19/2023.   Past Medical History  Lumbar spinal stenosis L3-4, L4-5 laminotomy/foraminotomies, T2DM, PAF, systolic CHF EF 35% s/p ICD placement, CAD status post [name], HTN, HLD  Significant Hospital Events   12/29: Admitted to MedSurg unit with suspected postop ileus NG tube placed for decompression.  Overnight became hypoglycemic requiring dextrose  infusion 12/30: Rapid response called for unresponsiveness, hypoglycemia in the low 20s and hypertension (70/37).  Transferred to ICU PCCM consulted 10/26/23 -patient critically ill, on levophed , has not had nourishment in >1 wk.  Plan for PICC. Blood cultures pending, renal function improved but still severe AKI, repeat KUB today showed OGT got displaced to airway , this was replaced and repeated with repeat imaging showing good placement.  10/26/22- patient more oriented, continues to require levophed .  Sacral wound covered.  Levophed  and D5-1/2NS started.    Consults:  PCCM  Procedures:  12/30 right femoral art line 12/30 right femoral central line  Significant Diagnostic Tests:  12/29: Chest Xray> IMPRESSION: 1. Minor left basilar atelectasis. Otherwise no acute cardiopulmonary findings. 2. Gaseous distension of bowel within the included upper abdomen.  12/30: Noncontrast CT head> IMPRESSION: No acute intracranial abnormality.  12/30: CTA  abdomen and pelvis> IMPRESSION: 1. Findings favoring ileus with persistent air and fluid distension of the small bowel, ascending, and transverse. There is no discrete transition point. Enteric tube decompresses the stomach with diminished gastric distension from yesterday. 2. No free air or mesenteric gas. 3. Progressive atelectasis in the medial left greater than right lower lobes. 4. Moderate-sized fat containing umbilical hernia.    Micro Data:  12/29: Blood culture x2> 12/30: MRSA PCR>>   Antimicrobials:  Azithromycin  12/29 Ceftriaxone  12/29  OBJECTIVE  Blood pressure (!) 89/51, pulse 99, temperature 98.2 F (36.8 C), temperature source Oral, resp. rate (!) 23, height 6' 4 (1.93 m), weight 113.3 kg, SpO2 94%.       Intake/Output Summary (Last 24 hours) at 10/27/2023 0927 Last data filed at 10/27/2023 0900 Gross per 24 hour  Intake 1389.92 ml  Output 2915 ml  Net -1525.08 ml   Filed Weights   10/24/23 1155 10/26/23 0500 10/27/23 0500  Weight: 113.3 kg 113.3 kg 113.3 kg    Physical Examination  GENERAL: 73 year-old critically ill patient lying in the bed in no acute distress EYES: PEERLA. No scleral icterus. Extraocular muscles intact.  HEENT: Head atraumatic, normocephalic. Oropharynx and nasopharynx clear.  NECK:  No JVD, supple  LUNGS: Normal breath sounds bilaterally.  No use of accessory muscles of respiration.  CARDIOVASCULAR: S1, S2 normal. No murmurs, rubs, or gallops.  ABDOMEN: Soft, NTND EXTREMITIES: trace swelling.  Capillary refill < 3 seconds in all extremities. Pulses palpable distally. NEUROLOGIC: The patient is  oriented to self only. No focal neurological deficit appreciated. Cranial nerves are intact.  SKIN: No obvious rash, lesion, or ulcer. Warm to touch Labs/imaging that I havepersonally reviewed  (right click and Reselect all SmartList Selections  daily)     Labs   CBC: Recent Labs  Lab 10/24/23 0700 10/25/23 0205 10/26/23 0419  10/27/23 0347  WBC 11.1* 6.4 7.2 7.1  HGB 13.9 11.8* 11.4* 11.7*  HCT 40.5 33.1* 32.8* 34.1*  MCV 90.4 87.1 87.0 90.5  PLT 244 143* 131* 129*    Basic Metabolic Panel: Recent Labs  Lab 10/24/23 0700 10/25/23 0205 10/25/23 1319 10/26/23 0419 10/27/23 0347  NA 130* 130* 132* 133* 138  K 4.4 4.3 4.7 4.2 3.8  CL 93* 100 101 100 103  CO2 13* 18* 16* 21* 23  GLUCOSE 166* 81 189* 210* 151*  BUN 89* 136* 98* 96* 103*  CREATININE 3.81* 4.31* 3.83* 3.60* 4.26*  CALCIUM  9.5 7.7* 7.9* 8.1* 8.2*  MG  --   --   --  2.0 2.3  PHOS 4.2  --   --  3.5 3.2   GFR: Estimated Creatinine Clearance: 21.3 mL/min (A) (by C-G formula based on SCr of 4.26 mg/dL (H)). Recent Labs  Lab 10/24/23 0700 10/24/23 1232 10/24/23 1314 10/24/23 1620 10/25/23 0205 10/25/23 0821 10/26/23 0419 10/26/23 1154 10/27/23 0347  PROCALCITON  --   --  11.21  --  35.61  --  51.30  --   --   WBC 11.1*  --   --   --  6.4  --  7.2  --  7.1  LATICACIDVEN  --    < > 8.4* 6.1* 4.2* 3.1*  --  1.4  --    < > = values in this interval not displayed.    Liver Function Tests: Recent Labs  Lab 10/24/23 0700 10/25/23 0205 10/26/23 0419 10/27/23 0347  AST 84* 227* 131* 78*  ALT 118* 147* 125* 95*  ALKPHOS 57 43 50 45  BILITOT 1.9* 0.8 1.0 0.8  PROT 7.2 5.3* 5.1* 4.8*  ALBUMIN  3.6 2.7* 2.4* 2.2*   Recent Labs  Lab 10/26/23 1237  LIPASE 19   No results for input(s): AMMONIA in the last 168 hours.  ABG    Component Value Date/Time   PHART 7.35 10/25/2023 0317   PCO2ART 27 (L) 10/25/2023 0317   PO2ART 75 (L) 10/25/2023 0317   HCO3 14.9 (L) 10/25/2023 0317   TCO2 26 04/15/2016 1707   ACIDBASEDEF 9.1 (H) 10/25/2023 0317   O2SAT 97.1 10/25/2023 0317     Coagulation Profile: No results for input(s): INR, PROTIME in the last 168 hours.  Cardiac Enzymes: No results for input(s): CKTOTAL, CKMB, CKMBINDEX, TROPONINI in the last 168 hours.  HbA1C: Hgb A1c MFr Bld  Date/Time Value Ref Range  Status  10/13/2023 12:00 PM 5.8 (H) 4.8 - 5.6 % Final    Comment:    (NOTE) Pre diabetes:          5.7%-6.4%  Diabetes:              >6.4%  Glycemic control for   <7.0% adults with diabetes   04/13/2016 06:37 PM 7.6 (H) 4.8 - 5.6 % Final    Comment:    (NOTE)         Pre-diabetes: 5.7 - 6.4         Diabetes: >6.4         Glycemic control for adults with diabetes: <7.0     CBG: Recent Labs  Lab 10/26/23 2126 10/26/23 2200 10/26/23 2327 10/27/23 0317 10/27/23 0725  GLUCAP 148* 151* 152* 172* 152*    Review of Systems:   Unable to be obtained secondary to the  patient's intubated altered mental status.   Past Medical History  He,  has a past medical history of AICD (automatic cardioverter/defibrillator) present, Arthritis, CHF (congestive heart failure) (HCC), Coronary artery disease involving native coronary artery with unstable angina pectoris (HCC) (04/13/2016), Coronary artery disease with history of myocardial infarction without history of [name], Cough, Diabetes mellitus without complication (HCC), Edema, Essential hypertension, Hypertension, Hypothyroidism, Left main coronary artery disease (04/13/2016), Mixed hyperlipidemia, Obesity, PAF (paroxysmal atrial fibrillation) (HCC), S/P [name] (04/14/2016), Type II diabetes mellitus (HCC), and Unstable angina (HCC) (04/13/2016).   Surgical History    Past Surgical History:  Procedure Laterality Date   BACK SURGERY     Lumbar Surgeries. [place], [place]. Dr. Drury x2   CARDIAC CATHETERIZATION Left 04/13/2016   Procedure: Left Heart Cath and Coronary Angiography;  Surgeon: Wolm JINNY Rhyme, MD;  Location: [facility] INVASIVE CV LAB;  Service: Cardiovascular;  Laterality: Left;   CARDIOVERSION N/A 08/02/2018   Procedure: CARDIOVERSION;  Surgeon: Rhyme Wolm JINNY, MD;  Location: [facility] ORS;  Service: Cardiovascular;  Laterality: N/A;   CATARACT EXTRACTION W/PHACO Right 01/23/2016   Procedure: CATARACT EXTRACTION PHACO AND  INTRAOCULAR LENS PLACEMENT (IOC);  Surgeon: Elsie Carmine, MD;  Location: [facility] ORS;  Service: Ophthalmology;  Laterality: Right;  US  01:18 AP% 23.8 CDE 18.67 fluid pack lot # [id number]   CHOLECYSTECTOMY     CORONARY ARTERY BYPASS GRAFT N/A 04/14/2016   Procedure: CORONARY ARTERY BYPASS GRAFTING TIMES FOUR    USING LEFT INTERNAL MAMMARY AND ENDOSCOPIC HARVEST RIGHT SAPHENOUS VEIN;  Surgeon: Sudie VEAR Laine, MD;  Location: [facility] OR;  Service: Open Heart Surgery;  Laterality: N/A;   EYE SURGERY Bilateral    Cataract Extraction with IOL implant.   INTRAOPERATIVE TRANSESOPHAGEAL ECHOCARDIOGRAM N/A 04/14/2016   Procedure: INTRAOPERATIVE TRANSESOPHAGEAL ECHOCARDIOGRAM;  Surgeon: Sudie VEAR Laine, MD;  Location: [facility] OR;  Service: Open Heart Surgery;  Laterality: N/A;   KNEE ARTHROSCOPY Left 10/08/2015   Procedure: ARTHROSCOPY KNEE, PARTIAL MEDIAL AND LATERAL MENISECTOMY, REMOVAL OF FOREIGN BODY;  Surgeon: Ozell Flake, MD;  Location: [facility] ORS;  Service: Orthopedics;  Laterality: Left;   LUMBAR LAMINECTOMY/DECOMPRESSION MICRODISCECTOMY Bilateral 10/18/2023   Procedure: Lumbar Three-Four, Lumbar Four-Five  Laminotomy, Foraminotomy;  Surgeon: Mavis Purchase, MD;  Location: [facility] OR;  Service: Neurosurgery;  Laterality: Bilateral;  3C     Social History   reports that he has never smoked. His smokeless tobacco use includes chew. He reports that he does not drink alcohol and does not use drugs.   Family History   His family history includes Aortic stenosis in his father; Heart disease in his father; Hypertension in his father.   Allergies Allergies  Allergen Reactions   Aspirin Anaphylaxis   Inderal [Propranolol]     Other reaction(s): Other (See Comments) Fatigue   Metformin  Hcl Diarrhea    Pt still takes medication   Mevacor [Lovastatin] Other (See Comments)    muscle Pain   Penicillin G Hives   Pravachol [Pravastatin Sodium] Other (See Comments)    Muscle Pain   Zocor [Simvastatin] Other (See  Comments)    Muscle Pain   Penicillins Rash    Has patient had a PCN reaction causing immediate rash, facial/tongue/throat swelling, SOB or lightheadedness with hypotension: Yes Has patient had a PCN reaction causing severe rash involving mucus membranes or skin necrosis: Yes Has patient had a PCN reaction that required hospitalization No Has patient had a PCN reaction occurring within the last 10 years: Yes If all of the above answers  are NO, then may proceed with Cephalosporin use.      Home Medications  Prior to Admission medications   Medication Sig Start Date End Date Taking? Authorizing Provider  acetaminophen  (TYLENOL ) 500 MG tablet Take 500-1,000 mg by mouth every 6 (six) hours as needed (pain.).    [provider]  amiodarone  (PACERONE ) 200 MG tablet Take 100 mg by mouth in the morning.    [provider]  apixaban  (ELIQUIS ) 5 MG TABS tablet Take 1 tablet (5 mg total) by mouth 2 (two) times daily. 10/25/23   Tomlinson, Sara Caylin, PA-C  cyclobenzaprine  (FLEXERIL ) 10 MG tablet Take 1 tablet (10 mg total) by mouth 3 (three) times daily as needed for muscle spasms. 10/19/23   Tomlinson, Sara Caylin, PA-C  dapagliflozin  propanediol (FARXIGA ) 10 MG TABS tablet Take 10 mg by mouth in the morning.    [provider]  docusate sodium  (COLACE) 100 MG capsule Take 1 capsule (100 mg total) by mouth 2 (two) times daily. 10/19/23 10/18/24  Johnanna Credit Caylin, PA-C  gabapentin  (NEURONTIN ) 100 MG capsule Take 100 mg by mouth at bedtime as needed (nerve pain.). 06/30/23   [provider]  glimepiride  (AMARYL ) 4 MG tablet Take 4 mg by mouth 2 (two) times daily. Reported on 04/13/2016    [provider]  levothyroxine  (SYNTHROID ) 50 MCG tablet Take 50 mcg by mouth daily at 2 am. 08/08/23   [provider]  metFORMIN  (GLUCOPHAGE ) 1000 MG tablet Take 1,000 mg by mouth in the morning and at bedtime.    [provider]  metoprolol   succinate (TOPROL -XL) 100 MG 24 hr tablet Take 100 mg by mouth in the morning. Take with or immediately following a meal.    [provider]  Oxycodone  HCl 10 MG TABS Take 1 tablet (10 mg total) by mouth every 4 (four) hours as needed for severe pain (pain score 7-10). 10/19/23   Johnanna Credit Caylin, PA-C  rosuvastatin  (CRESTOR ) 40 MG tablet Take 20 mg by mouth at bedtime. 07/01/23   [provider]  sacubitril -valsartan  (ENTRESTO ) 49-51 MG Take 1 tablet by mouth 2 (two) times daily.    [provider]  torsemide  (DEMADEX ) 20 MG tablet Take 20 mg by mouth in the morning. 11/23/22   [provider]  Scheduled Meds:  amiodarone   100 mg Per Tube Daily   Chlorhexidine  Gluconate Cloth  6 each Topical Daily   heparin   5,000 Units Subcutaneous Q8H   insulin  aspart  0-15 Units Subcutaneous Q4H   levothyroxine   50 mcg Oral Q0600   lidocaine   1 patch Transdermal Q24H   sodium chloride  flush  10-40 mL Intracatheter Q12H   thiamine  (VITAMIN B1) injection  100 mg Intravenous Q24H   Continuous Infusions:  dextrose  5 % and 0.45 % NaCl 50 mL/hr at 10/27/23 0900   norepinephrine  (LEVOPHED ) Adult infusion 8 mcg/min (10/27/23 0900)   piperacillin -tazobactam (ZOSYN )  IV 3.375 g (10/27/23 0810)   [name] (CLINIMIX -E) Adult 42 mL/hr at 10/27/23 0900   PRN Meds:.acetaminophen  **OR** acetaminophen , cyclobenzaprine , dextrose , hydrALAZINE , ondansetron  **OR** ondansetron  (ZOFRAN ) IV, mouth rinse, sodium chloride  flush   Active Hospital Problem list   See systems below  Assessment & Plan:   #Ileus Suspect post op related due to resent spinal surgery Repeat CT abd/pelvis ileus with persistent air and fluid distension of the small bowel, ascending, and transverse. -Keep Npo -Gentle IVFs -NGT for decompression -Surgery consult -Hold narcotics  #Sepsis due to Suspected Intra-abdominal source -sacral wounds present on  admission  -F/u cultures, trend lactic/ PCT -Monitor WBC/  fever curve -IV antibiotics: ceftriaxone  and Azithromycin  -IVF hydration as needed -Pressors for MAP goal >65 -Strict I/O's  #AKI Likely ATN~improved  #Hyponatremia #NAGMA with Lactic Acidosis -Trend Lactate -Avoid nephrotoxic agents  -Follow BMP -Replace electrolytes as indicated -Nephrology consult   #Elevated LFTs -shock liver in the setting of severe sepsis with hypotension -Trend cmp  #Mildly Elevated Troponin, suspect demand ischemia #HFrEF  EF 35% s/p ICD placement  #Hypertension PMHx: CAD s/p [name] X4, PAF, HLD BNP mildly elevated without overt signs of volume overload or pulmonary edema -Trend HS Troponin until peaked -Hold torsemide , Entresto , metoprolol , Farxiga  and amiodarone   -Continue rosuvastatin  once able to tolerate p.o. -Hold Eliquis  for now -Obtain 2D echo  #T2 Diabetes mellitus  -Check hemoglobin A1c -CBGs -Sliding scale insulin  -Follow ICU hyper/hypoglycemia protocol -Hold metformin  and glimepiride    #Chronic Pain #Hx of Chronic Bilateral Low Back Pain  #hx of severe lumbar stenosis from L3-L5 with right lower extremity radiculopathy s/p  L3-4, L4-5 laminotomy and foraminotomies on 10/19/2023  -Follows with Neurosugeon  -on Gabapentin  and Flexeril , and nerve block PRN  #Hypothyroidism -Check TSH and free T4 -Continue home Synthroid   Best practice:  Diet:  NPO Pain/Anxiety/Delirium protocol (if indicated): No VAP protocol (if indicated): Not indicated DVT prophylaxis: Subcutaneous Heparin  GI prophylaxis: PPI Glucose control:  SSI No Central venous access:  Yes, and it is still needed Arterial line:  Yes, and it is still needed Foley:  N/A Mobility:  bed rest  PT consulted: N/A Last date of multidisciplinary goals of care discussion [updated wife over the phone] Code Status:  full code Disposition: ICU   = Goals of Care = Code Status Order: FULL  Primary Emergency Contact: Laverdiere,Katie V, Home Phone: [phone] Wishes to pursue  full aggressive treatment and intervention options, including CPR and intubation, but goals of care will be addressed on going with family if that should become necessary.  Critical care provider statement:   Total critical care time: 33 minutes   Performed by: Parris MD   Critical care time was exclusive of separately billable procedures and treating other patients.   Critical care was necessary to treat or prevent imminent or life-threatening deterioration.   Critical care was time spent personally by me on the following activities: development of treatment plan with patient and/or surrogate as well as nursing, discussions with consultants, evaluation of patient's response to treatment, examination of patient, obtaining history from patient or surrogate, ordering and performing treatments and interventions, ordering and review of laboratory studies, ordering and review of radiographic studies, pulse oximetry and re-evaluation of patient's condition.    Kassi Esteve, M.D.  Pulmonary & Critical Care Medicine

## 2023-10-27 NOTE — TOC CM/SW Note (Signed)
 Transition of Care [facility]) - Inpatient Brief Assessment   Patient Details  Name: Jason [name] MRN: [id number] Date of Birth: 09/19/1951  Transition of [facility]) CM/SW Contact:    Lauraine [name], LCSW Phone Number: 10/27/2023, 4:43 PM   Clinical Narrative: CSW reviewed chart. If patient's oxygen is acute, will follow for potential home oxygen needs. No other TOC needs identified at this time.  Transition of Care Asessment: Insurance and Status: Insurance coverage has been reviewed Patient has primary care physician: Yes Home environment has been reviewed: Single family home Prior level of function:: Not documented Prior/Current Home Services: No current home services Social Drivers of Health Review: SDOH reviewed no interventions necessary Readmission risk has been reviewed: Yes Transition of care needs: transition of care needs identified, TOC will continue to follow

## 2023-10-27 NOTE — Consult Note (Signed)
 PHARMACY - TOTAL PARENTERAL NUTRITION CONSULT NOTE   Indication: Prolonged ileus  Patient Measurements: Height: 6' 4 (193 cm) Weight: 113.3 kg (249 lb 12.5 oz) IBW/kg (Calculated) : 86.8 [name] AdjBW (KG): 93.4 Body mass index is 30.4 kg/m.  Assessment:   From HPI: 73 y/o M with history of CAD status post four-vessel [name] in 2017, hypertension, hyperlipidemia, non-insulin -dependent diabetes mellitus, history of lumbar stenosis status post spinal stenosis surgery on 10/18/2023.  Patient admitted with ileus and sepsis. Patient reportedly has not been eating since his surgery. Pharmacy consulted to initiate [name] for ileus.  Glucose / Insulin :  --Last Hgb A1c was 5.8% on 10/13/23 --BG 151 - 210 last 24h, 17u SSI required Electrolytes: Overall normal Renal: Patient presenting with AKI. Scr is down-trending Hepatic: Mild transaminitis. Albumin  low. Tbili normal Intake / Output; MIVF: + 1.2L for the admission. UOP 1.14L yesterday GI Imaging: 12/30 Abdominal x-ray: Dilated loops of gas-filled small bowel  GI Surgeries / Procedures: N/A  Central access:  PICC 12/31 [name] start date: 12/31  Nutritional Goals: Given shortage in products used for [name] compounding, parenteral nutrition will be provided to meet nutritional requirements as best as able based on current supply  RD Assessment: Estimated Needs Total Energy Estimated Needs: 2500-2800kcal/day Total Protein Estimated Needs: 125-140g/day Total Fluid Estimated Needs: 2.3-2.6L/day  Current Nutrition:  NPO  Plan:  --Continue Clinimix  E 8/10 1L over 24h as continuous infusion and SMOFlipid  20% 250 mL over 12 hours --Electrolytes provided in standard concentrations as per Clinimix  E 8/10 formula --Add standard MVI and trace elements to [name] --Continue Moderate q4h SSI and adjust as needed(may need to add to bag if requirements continue to increase) --Monitor [name] labs on Mon/Thurs, daily until stable  Kennedi Lizardo A Yenesis Even 10/27/2023,1:53  PM

## 2023-10-27 NOTE — Procedures (Signed)
 Arterial Catheter Insertion Procedure Note  Jason [name]  [id number]  04-05-1951  Date:10/27/23  Time:10:03 AM    Provider Performing: Inge JONETTA Lecher    Procedure: Insertion of Arterial Line (63379) with US  guidance (23062)   Indication(s) Blood pressure monitoring and/or need for frequent ABGs  Consent Unable to obtain consent due to emergent nature of procedure.  Anesthesia None   Time Out Verified patient identification, verified procedure, site/side was marked, verified correct patient position, special equipment/implants available, medications/allergies/relevant history reviewed, required imaging and test results available.   Sterile Technique Maximal sterile technique including full sterile barrier drape, hand hygiene, sterile gown, sterile gloves, mask, hair covering, sterile ultrasound probe cover (if used).   Procedure Description Area of catheter insertion was cleaned with chlorhexidine  and draped in sterile fashion. With real-time ultrasound guidance an arterial catheter was placed into the left radial artery.  Appropriate arterial tracings confirmed on monitor.     Complications/Tolerance None; patient tolerated the procedure well.   EBL Minimal   Specimen(s) None    Inge Lecher, AGACNP-BC [facility] Prefer epic messenger for cross cover needs If after hours, please call E-link

## 2023-10-28 ENCOUNTER — Inpatient Hospital Stay: Payer: Medicare Other

## 2023-10-28 LAB — COMPREHENSIVE METABOLIC PANEL
ALT: 70 U/L — ABNORMAL HIGH (ref 0–44)
AST: 40 U/L (ref 15–41)
Albumin: 2.1 g/dL — ABNORMAL LOW (ref 3.5–5.0)
Alkaline Phosphatase: 46 U/L (ref 38–126)
Anion gap: 10 (ref 5–15)
BUN: 107 mg/dL — ABNORMAL HIGH (ref 8–23)
CO2: 22 mmol/L (ref 22–32)
Calcium: 8.1 mg/dL — ABNORMAL LOW (ref 8.9–10.3)
Chloride: 106 mmol/L (ref 98–111)
Creatinine, Ser: 3.95 mg/dL — ABNORMAL HIGH (ref 0.61–1.24)
GFR, Estimated: 15 mL/min — ABNORMAL LOW (ref >60)
Glucose, Bld: 246 mg/dL — ABNORMAL HIGH (ref 70–99)
Potassium: 3.8 mmol/L (ref 3.5–5.1)
Sodium: 138 mmol/L (ref 135–145)
Total Bilirubin: 0.8 mg/dL (ref 0.0–1.2)
Total Protein: 5 g/dL — ABNORMAL LOW (ref 6.5–8.1)

## 2023-10-28 LAB — GLUCOSE, CAPILLARY
Glucose-Capillary: 175 mg/dL — ABNORMAL HIGH (ref 70–99)
Glucose-Capillary: 193 mg/dL — ABNORMAL HIGH (ref 70–99)
Glucose-Capillary: 203 mg/dL — ABNORMAL HIGH (ref 70–99)
Glucose-Capillary: 206 mg/dL — ABNORMAL HIGH (ref 70–99)
Glucose-Capillary: 231 mg/dL — ABNORMAL HIGH (ref 70–99)
Glucose-Capillary: 231 mg/dL — ABNORMAL HIGH (ref 70–99)

## 2023-10-28 LAB — CORTISOL: Cortisol, Plasma: 27.5 ug/dL

## 2023-10-28 LAB — PHOSPHORUS: Phosphorus: 3.1 mg/dL (ref 2.5–4.6)

## 2023-10-28 LAB — CBC
HCT: 34.6 % — ABNORMAL LOW (ref 39.0–52.0)
Hemoglobin: 11.8 g/dL — ABNORMAL LOW (ref 13.0–17.0)
MCH: 31.4 pg (ref 26.0–34.0)
MCHC: 34.1 g/dL (ref 30.0–36.0)
MCV: 92 fL (ref 80.0–100.0)
Platelets: 135 10*3/uL — ABNORMAL LOW (ref 150–400)
RBC: 3.76 MIL/uL — ABNORMAL LOW (ref 4.22–5.81)
RDW: 13.3 % (ref 11.5–15.5)
WBC: 8.3 10*3/uL (ref 4.0–10.5)
nRBC: 0 % (ref 0.0–0.2)

## 2023-10-28 LAB — HEPARIN LEVEL (UNFRACTIONATED)
Heparin Unfractionated: <0.1 [IU]/mL — ABNORMAL LOW (ref 0.30–0.70)
Heparin Unfractionated: 0.2 [IU]/mL — ABNORMAL LOW (ref 0.30–0.70)

## 2023-10-28 LAB — MAGNESIUM: Magnesium: 2.2 mg/dL (ref 1.7–2.4)

## 2023-10-28 MED ORDER — FAT EMUL FISH OIL/PLANT BASED 20% (SMOFLIPID)IV EMUL
250.0000 mL | INTRAVENOUS | Status: AC
  Administered 2023-10-28: 250 mL via INTRAVENOUS
  Filled 2023-10-28: qty 250

## 2023-10-28 MED ORDER — HEPARIN BOLUS VIA INFUSION
1500.0000 [IU] | Freq: Once | INTRAVENOUS | Status: AC
  Administered 2023-10-28: 1500 [IU] via INTRAVENOUS
  Filled 2023-10-28: qty 1500

## 2023-10-28 MED ORDER — MELATONIN 5 MG PO TABS
5.0000 mg | ORAL_TABLET | Freq: Every day | ORAL | Status: DC
  Administered 2023-10-29 – 2023-11-03 (×7): 5 mg via ORAL
  Filled 2023-10-28 (×7): qty 1

## 2023-10-28 MED ORDER — HEPARIN BOLUS VIA INFUSION
3300.0000 [IU] | Freq: Once | INTRAVENOUS | Status: AC
  Administered 2023-10-28: 3300 [IU] via INTRAVENOUS
  Filled 2023-10-28: qty 3300

## 2023-10-28 MED ORDER — LACTATED RINGERS IV BOLUS
1000.0000 mL | Freq: Once | INTRAVENOUS | Status: AC
Start: 2023-10-28 — End: 2023-10-28
  Administered 2023-10-28: 1000 mL via INTRAVENOUS

## 2023-10-28 MED ORDER — MIDODRINE HCL 5 MG PO TABS
10.0000 mg | ORAL_TABLET | Freq: Three times a day (TID) | ORAL | Status: DC
  Administered 2023-10-29 – 2023-11-01 (×11): 10 mg via ORAL
  Filled 2023-10-28 (×11): qty 2

## 2023-10-28 MED ORDER — MIDODRINE HCL 5 MG PO TABS
5.0000 mg | ORAL_TABLET | Freq: Three times a day (TID) | ORAL | Status: DC
  Administered 2023-10-28 (×2): 5 mg via ORAL
  Filled 2023-10-28 (×2): qty 1

## 2023-10-28 MED ORDER — TRACE MINERALS CU-MN-SE-ZN 300-55-60-3000 MCG/ML IV SOLN
INTRAVENOUS | Status: AC
  Filled 2023-10-28: qty 1000

## 2023-10-28 MED ORDER — INSULIN ASPART 100 UNIT/ML IJ SOLN
0.0000 [IU] | INTRAMUSCULAR | Status: DC
  Administered 2023-10-28 (×2): 7 [IU] via SUBCUTANEOUS
  Administered 2023-10-28 – 2023-10-29 (×7): 4 [IU] via SUBCUTANEOUS
  Administered 2023-10-29: 7 [IU] via SUBCUTANEOUS
  Administered 2023-10-30: 3 [IU] via SUBCUTANEOUS
  Administered 2023-10-30: 4 [IU] via SUBCUTANEOUS
  Filled 2023-10-28 (×12): qty 1

## 2023-10-28 MED ORDER — LACTATED RINGERS IV SOLN: INTRAVENOUS | Status: AC

## 2023-10-28 NOTE — Progress Notes (Signed)
 [facility]  ROUNDING NOTE   Subjective:   Patient continues to have significant acute kidney injury. Creatinine down slightly to 3.95. Patient did have good urine output however over the preceding 24 hours. Patient currently awake and alert.  Objective:  Vital signs in last 24 hours:  Temp:  [97.5 F (36.4 C)-98.9 F (37.2 C)] 98.4 F (36.9 C) (01/02 1215) Pulse Rate:  [59-117] 96 (01/02 1530) Resp:  [15-30] 27 (01/02 1530) BP: (104-118)/(54-75) 104/58 (01/02 0800) SpO2:  [89 %-96 %] 94 % (01/02 1530) Arterial Line BP: (103-166)/(31-61) 123/43 (01/02 1530) Weight:  [112.8 kg] 112.8 kg (01/02 0500)  Weight change: -0.5 kg Filed Weights   10/26/23 0500 10/27/23 0500 10/28/23 0500  Weight: 113.3 kg 113.3 kg 112.8 kg    Intake/Output: I/O last 3 completed shifts: In: 4791.3 [P.O.:240; I.V.:3778.6; NG/GT:90; IV Piggyback:682.8] Out: 4760 [Urine:3210; Emesis/NG output:1550]   Intake/Output this shift:  Total I/O In: 2240 [I.V.:1129.2; NG/GT:30; IV Piggyback:1080.7] Out: 985 [Urine:985]  Physical Exam: General: No acute distress  Head: Normocephalic, atraumatic. Moist oral mucosal membranes  Neck: Supple  Lungs:  Clear to auscultation, normal effort  Heart: S1S2 no rubs  Abdomen:  Soft, nontender, bowel sounds present  Extremities: No peripheral edema.  Neurologic: Awake, alert, following commands  Skin: No acute rash  Access: No hemodialysis access    Basic Metabolic Panel: Recent Labs  Lab 10/24/23 0700 10/25/23 0205 10/25/23 1319 10/26/23 0419 10/27/23 0347 10/28/23 0510  NA 130* 130* 132* 133* 138 138  K 4.4 4.3 4.7 4.2 3.8 3.8  CL 93* 100 101 100 103 106  CO2 13* 18* 16* 21* 23 22  GLUCOSE 166* 81 189* 210* 151* 246*  BUN 89* 136* 98* 96* 103* 107*  CREATININE 3.81* 4.31* 3.83* 3.60* 4.26* 3.95*  CALCIUM  9.5 7.7* 7.9* 8.1* 8.2* 8.1*  MG  --   --   --  2.0 2.3 2.2  PHOS 4.2  --   --  3.5 3.2 3.1    Liver Function Tests: Recent Labs   Lab 10/24/23 0700 10/25/23 0205 10/26/23 0419 10/27/23 0347 10/28/23 0510  AST 84* 227* 131* 78* 40  ALT 118* 147* 125* 95* 70*  ALKPHOS 57 43 50 45 46  BILITOT 1.9* 0.8 1.0 0.8 0.8  PROT 7.2 5.3* 5.1* 4.8* 5.0*  ALBUMIN  3.6 2.7* 2.4* 2.2* 2.1*   Recent Labs  Lab 10/26/23 1237  LIPASE 19   No results for input(s): AMMONIA in the last 168 hours.  CBC: Recent Labs  Lab 10/24/23 0700 10/25/23 0205 10/26/23 0419 10/27/23 0347 10/28/23 0510  WBC 11.1* 6.4 7.2 7.1 8.3  HGB 13.9 11.8* 11.4* 11.7* 11.8*  HCT 40.5 33.1* 32.8* 34.1* 34.6*  MCV 90.4 87.1 87.0 90.5 92.0  PLT 244 143* 131* 129* 135*    Cardiac Enzymes: No results for input(s): CKTOTAL, CKMB, CKMBINDEX, TROPONINI in the last 168 hours.  BNP: Invalid input(s): POCBNP  CBG: Recent Labs  Lab 10/27/23 2040 10/28/23 0000 10/28/23 0413 10/28/23 0724 10/28/23 1152  GLUCAP 231* 206* 231* 231* 203*    Microbiology: Results for orders placed or performed during the hospital encounter of 10/24/23  Blood culture (routine x 2)     Status: None (Preliminary result)   Collection Time: 10/24/23  1:14 PM   Specimen: BLOOD  Result Value Ref Range Status   Specimen Description BLOOD BLOOD LEFT HAND  Final   Special Requests   Final    BOTTLES DRAWN AEROBIC AND ANAEROBIC Blood Culture adequate  volume   Culture   Final    NO GROWTH 4 DAYS Performed at [facility], [address] [place], [place] [address]    Report Status PENDING  Incomplete  Blood culture (routine x 2)     Status: None (Preliminary result)   Collection Time: 10/24/23  8:20 PM   Specimen: BLOOD LEFT HAND  Result Value Ref Range Status   Specimen Description BLOOD LEFT HAND  Final   Special Requests   Final    BOTTLES DRAWN AEROBIC AND ANAEROBIC Blood Culture results may not be optimal due to an inadequate volume of blood received in culture bottles   Culture   Final    NO GROWTH 4 DAYS Performed at [facility], [address], [place], [place] [address]    Report Status PENDING  Incomplete  MRSA Next Gen by PCR, Nasal     Status: None   Collection Time: 10/25/23  2:05 AM   Specimen: Nasal Mucosa; Nasal Swab  Result Value Ref Range Status   MRSA by PCR Next Gen NOT DETECTED NOT DETECTED Final    Comment: (NOTE) The GeneXpert MRSA Assay (FDA approved for NASAL specimens only), is one component of a comprehensive MRSA colonization surveillance program. It is not intended to diagnose MRSA infection nor to guide or monitor treatment for MRSA infections. Test performance is not FDA approved in patients less than 62 years old. Performed at [facility], [address] [place], [place] [address]   SARS Coronavirus 2 by RT PCR (hospital order, performed in [facility] hospital lab) *cepheid single result test* Anterior Nasal Swab     Status: None   Collection Time: 10/25/23  8:37 AM   Specimen: Anterior Nasal Swab  Result Value Ref Range Status   SARS Coronavirus 2 by RT PCR NEGATIVE NEGATIVE Final    Comment: (NOTE) SARS-CoV-2 target nucleic acids are NOT DETECTED.  The SARS-CoV-2 RNA is generally detectable in upper and lower respiratory specimens during the acute phase of infection. The lowest concentration of SARS-CoV-2 viral copies this assay can detect is 250 copies / mL. A negative result does not preclude SARS-CoV-2 infection and should not be used as the sole basis for treatment or other patient management decisions.  A negative result may occur with improper specimen collection / handling, submission of specimen other than nasopharyngeal swab, presence of viral mutation(s) within the areas targeted by this assay, and inadequate number of viral copies (<250 copies / mL). A negative result must be combined with clinical observations, patient history, and epidemiological information.  Fact Sheet for Patients:   roadlaptop.co.za  Fact Sheet for  Healthcare Providers: http://kim-miller.com/  This test is not yet approved or  cleared by the [place]  FDA and has been authorized for detection and/or diagnosis of SARS-CoV-2 by FDA under an Emergency Use Authorization (EUA).  This EUA will remain in effect (meaning this test can be used) for the duration of the COVID-19 declaration under Section 564(b)(1) of the Act, 21 U.S.C. section 360bbb-3(b)(1), unless the authorization is terminated or revoked sooner.  Performed at [facility], [address] [place], [place] [address]   Respiratory (~20 pathogens) panel by PCR     Status: None   Collection Time: 10/25/23  8:37 AM   Specimen: Nasopharyngeal Swab; Respiratory  Result Value Ref Range Status   Adenovirus NOT DETECTED NOT DETECTED Final   Coronavirus 229E NOT DETECTED NOT DETECTED Final    Comment: (NOTE) The Coronavirus  on the Respiratory Panel, DOES NOT test for the novel  Coronavirus Oct 30, 2018 nCoV)    Coronavirus HKU1 NOT DETECTED NOT DETECTED Final   Coronavirus NL63 NOT DETECTED NOT DETECTED Final   Coronavirus OC43 NOT DETECTED NOT DETECTED Final   Metapneumovirus NOT DETECTED NOT DETECTED Final   Rhinovirus / Enterovirus NOT DETECTED NOT DETECTED Final   Influenza A NOT DETECTED NOT DETECTED Final   Influenza B NOT DETECTED NOT DETECTED Final   Parainfluenza Virus 1 NOT DETECTED NOT DETECTED Final   Parainfluenza Virus 2 NOT DETECTED NOT DETECTED Final   Parainfluenza Virus 3 NOT DETECTED NOT DETECTED Final   Parainfluenza Virus 4 NOT DETECTED NOT DETECTED Final   Respiratory Syncytial Virus NOT DETECTED NOT DETECTED Final   Bordetella pertussis NOT DETECTED NOT DETECTED Final   Bordetella Parapertussis NOT DETECTED NOT DETECTED Final   Chlamydophila pneumoniae NOT DETECTED NOT DETECTED Final   Mycoplasma pneumoniae NOT DETECTED NOT DETECTED Final    Comment: Performed at [facility], [address], [place], [place]  [address]    Coagulation Studies: Recent Labs    10/27/23 10-30-2133  LABPROT 15.0  INR 1.2    Urinalysis: No results for input(s): COLORURINE, LABSPEC, PHURINE, GLUCOSEU, HGBUR, BILIRUBINUR, KETONESUR, PROTEINUR, UROBILINOGEN, NITRITE, LEUKOCYTESUR in the last 72 hours.  Invalid input(s): APPERANCEUR    Imaging: DG Abd 1 View Result Date: 10/28/2023 CLINICAL DATA:  Ileus EXAM: ABDOMEN - 1 VIEW COMPARISON:  10/26/2023 FINDINGS: Similar appearance of distended small bowel throughout the central abdomen, loops measuring up to 6.1 cm. Scattered gas present in the colon. No free air on supine radiographs. Right femoral vascular catheter. No acute osseous findings. IMPRESSION: Similar appearance of distended small bowel throughout the central abdomen, loops measuring up to 6.1 cm. Scattered gas present in the colon. Findings are consistent with ileus or small-bowel obstruction. No free air on supine radiographs. Electronically Signed   By: Marolyn JONETTA Jaksch M.D.   On: 10/28/2023 08:19   US  RENAL Result Date: 10/27/2023 CLINICAL DATA:  Acute kidney injury. EXAM: RENAL / URINARY TRACT ULTRASOUND COMPLETE COMPARISON:  None Available. FINDINGS: Right Kidney: Renal measurements: 12.5 x 5.9 x 6.0 cm = volume: 229 ML. Echogenicity within normal limits. No hydronephrosis visualized. Upper pole kidney cyst measures 2.8 x 1.8 x 2.3 cm Left Kidney: Renal measurements: 13.8 x 6.1 x 7.1 cm = volume: 309.1 mL. Echogenicity within normal limits. No mass or hydronephrosis visualized. Bladder: Bladder decompressed around a Foley catheter. Other: None. IMPRESSION: 1. No acute findings. No hydronephrosis. 2. Right upper pole renal cyst. Electronically Signed   By: Waddell Calk M.D.   On: 10/27/2023 09:45   DG Abd 1 View Result Date: 10/26/2023 CLINICAL DATA:  Check gastric catheter placement EXAM: ABDOMEN - 1 VIEW COMPARISON:  None Available. FINDINGS: Gastric catheter is noted with the tip in the  stomach. Stomach is distended with air. No free air is seen. No bony abnormality is noted. IMPRESSION: Gastric catheter within the stomach. Electronically Signed   By: Oneil Devonshire M.D.   On: 10/26/2023 18:42   US  EKG SITE RITE Result Date: 10/26/2023 If Site Rite image not attached, placement could not be confirmed due to current cardiac rhythm.    Medications:    amiodarone  30 mg/hr (10/28/23 1500)   [name] (CLINIMIX -E) Adult     And   fat emul(SMOFlipid )     heparin  1,950 Units/hr (10/28/23 1500)   lactated ringers  50 mL/hr at 10/28/23 1500   norepinephrine  (LEVOPHED )  Adult infusion 5 mcg/min (10/28/23 1500)   piperacillin -tazobactam (ZOSYN )  IV Stopped (10/28/23 1309)   [name] (CLINIMIX -E) Adult 42 mL/hr at 10/28/23 1500   vasopressin  Stopped (10/28/23 1411)    Chlorhexidine  Gluconate Cloth  6 each Topical Daily   heparin   1,500 Units Intravenous Once   insulin  aspart  0-20 Units Subcutaneous Q4H   [START ON 10/31/2023] levothyroxine   37.5 mcg Intravenous Daily   lidocaine   1 patch Transdermal Q24H   midodrine   5 mg Oral TID WC   sodium chloride  flush  10-40 mL Intracatheter Q12H   thiamine  (VITAMIN B1) injection  100 mg Intravenous Q24H   acetaminophen  **OR** [DISCONTINUED] acetaminophen , dextrose , fentaNYL  (SUBLIMAZE ) injection, ondansetron  **OR** ondansetron  (ZOFRAN ) IV, mouth rinse, polyethylene glycol, senna-docusate, sodium chloride  flush  Assessment/ Plan:  73 y.o. male  with coronary artery disease status post [name], hypertension, hyperlipidemia, diabetes mellitus type II noninsulin dependent, who was admitted to [facility] on 10/24/2023 for Ileus [facility]) [K56.7] Status post lumbar stenosis surgery on 10/18/23.   Acute Kidney Injury/chronic kidney disease stage IIIa: baseline creatinine of 1.28, GFR of 59.  Suspect acute kidney injury due to ATN most likely.  Currently Entresto , torsemide , metformin , Farxiga  all on hold.  Patient switched to lactated Ringer 's.  Continue IV fluid  hydration for now.  No immediate need for dialysis at the moment.  Hypotension.  Patient now off of vasopressin  but still on low-dose norepinephrine .  Maintain MAP of 65 or greater.     LOS: 4 Rondall Radigan 1/2/20253:40 PM

## 2023-10-28 NOTE — Progress Notes (Signed)
 ANTICOAGULATION CONSULT NOTE  Pharmacy Consult for heparin  infusion Indication: atrial fibrillation  Allergies  Allergen Reactions   Aspirin Anaphylaxis   Inderal [Propranolol]     Other reaction(s): Other (See Comments) Fatigue   Metformin  Hcl Diarrhea    Pt still takes medication   Mevacor [Lovastatin] Other (See Comments)    muscle Pain   Penicillin G Hives   Pravachol [Pravastatin Sodium] Other (See Comments)    Muscle Pain   Zocor [Simvastatin] Other (See Comments)    Muscle Pain   Penicillins Rash    Has patient had a PCN reaction causing immediate rash, facial/tongue/throat swelling, SOB or lightheadedness with hypotension: Yes Has patient had a PCN reaction causing severe rash involving mucus membranes or skin necrosis: Yes Has patient had a PCN reaction that required hospitalization No Has patient had a PCN reaction occurring within the last 10 years: Yes If all of the above answers are NO, then may proceed with Cephalosporin use.     Patient Measurements: Height: 6' 4 (193 cm) Weight: 112.8 kg (248 lb 10.9 oz) IBW/kg (Calculated) : 86.8 Heparin  Dosing Weight: 109.9 kg  Vital Signs: Temp: 98.1 F (36.7 C) (01/02 0400) Temp Source: Oral (01/02 0400) BP: 109/56 (01/02 0400) Pulse Rate: 103 (01/02 0615)  Labs: Recent Labs    10/26/23 0419 10/27/23 0347 10/27/23 2134 10/28/23 0510  HGB 11.4* 11.7*  --  11.8*  HCT 32.8* 34.1*  --  34.6*  PLT 131* 129*  --  135*  APTT  --   --  29  --   LABPROT  --   --  15.0  --   INR  --   --  1.2  --   HEPARINUNFRC  --   --   --  <0.10*  CREATININE 3.60* 4.26*  --  3.95*    Estimated Creatinine Clearance: 22.9 mL/min (A) (by C-G formula based on SCr of 3.95 mg/dL (H)).   Medical History: Past Medical History:  Diagnosis Date   AICD (automatic cardioverter/defibrillator) present    Medtronic   Arthritis    CHF (congestive heart failure) (HCC)    Coronary artery disease involving native coronary artery with  unstable angina pectoris (HCC) 04/13/2016   Coronary artery disease with history of myocardial infarction without history of [name]    Dr. Quin   Cough    CHRONIC   Diabetes mellitus without complication (HCC)    Edema    FEET/LEGS   Essential hypertension    Hypertension    Hypothyroidism    Left main coronary artery disease 04/13/2016   Mixed hyperlipidemia    Obesity    PAF (paroxysmal atrial fibrillation) (HCC)    S/P [name] 04/14/2016   LIMA to LAD, SVG to D1, SVG to OM, SVG to PDA, EVH via right thigh and leg   Type II diabetes mellitus (HCC)    Unstable angina (HCC) 04/13/2016    Assessment: Pt is a 73 yo male admitted on 10/25/23 with ileus, with h/o A fib on Eliquis  5 mg BID.  Eliquis  not resumes since admission, pt ordered heparin  5000 units sub-Q q8hr starting 12/29, last dose 10/26/22 @ 1327.  Goal of Therapy:  Heparin  level 0.3-0.7 units/ml Monitor platelets by anticoagulation protocol: Yes   Plan:  1/2:  HL @ 0510 = < 0.10,  SUBtherapeutic  - Will order heparin  3300 units IV X 1 and increase drip rate to 1950 units/hr CBC daily while on heparin   Jaydin Boniface D 10/28/2023 6:30 AM

## 2023-10-28 NOTE — Progress Notes (Signed)
 ANTICOAGULATION CONSULT NOTE  Pharmacy Consult for Heparin  Infusion Indication: atrial fibrillation  Patient Measurements: Height: 6' 4 (193 cm) Weight: 112.8 kg (248 lb 10.9 oz) IBW/kg (Calculated) : 86.8 Heparin  Dosing Weight: 109.9 kg  Labs: Recent Labs    10/26/23 0419 10/27/23 0347 10/27/23 2134 10/28/23 0510 10/28/23 1451  HGB 11.4* 11.7*  --  11.8*  --   HCT 32.8* 34.1*  --  34.6*  --   PLT 131* 129*  --  135*  --   APTT  --   --  29  --   --   LABPROT  --   --  15.0  --   --   INR  --   --  1.2  --   --   HEPARINUNFRC  --   --   --  <0.10* 0.20*  CREATININE 3.60* 4.26*  --  3.95*  --     Estimated Creatinine Clearance: 22.9 mL/min (A) (by C-G formula based on SCr of 3.95 mg/dL (H)).   Medical History: Past Medical History:  Diagnosis Date   AICD (automatic cardioverter/defibrillator) present    Medtronic   Arthritis    CHF (congestive heart failure) (HCC)    Coronary artery disease involving native coronary artery with unstable angina pectoris (HCC) 04/13/2016   Coronary artery disease with history of myocardial infarction without history of [name]    Dr. Quin   Cough    CHRONIC   Diabetes mellitus without complication (HCC)    Edema    FEET/LEGS   Essential hypertension    Hypertension    Hypothyroidism    Left main coronary artery disease 04/13/2016   Mixed hyperlipidemia    Obesity    PAF (paroxysmal atrial fibrillation) (HCC)    S/P [name] 04/14/2016   LIMA to LAD, SVG to D1, SVG to OM, SVG to PDA, EVH via right thigh and leg   Type II diabetes mellitus (HCC)    Unstable angina (HCC) 04/13/2016    Assessment: Pt is a 73 yo male admitted on 10/25/23 with ileus, with h/o A fib on Eliquis  5 mg BID.  Eliquis  not resumes since admission, pt ordered heparin  5000 units sub-Q q8hr starting 12/29, last dose 10/26/22 @ 1327.  Goal of Therapy:  Heparin  level 0.3-0.7 units/ml Monitor platelets by anticoagulation protocol: Yes   Plan:  --1/2 at 1451  HL = 0.2, subtherapeutic --Heparin  1500 units IV bolus and increase heparin  infusion to 2150 units/hr --Re-check HL 8 hours from rate change --Daily CBC per protocol while on IV heparin   Marolyn KATHEE Mare 10/28/2023 3:34 PM

## 2023-10-28 NOTE — Plan of Care (Signed)

## 2023-10-28 NOTE — Plan of Care (Signed)

## 2023-10-28 NOTE — Evaluation (Signed)
 Occupational Therapy Evaluation Patient Details Name: Jason [name] MRN: [id number] DOB: Apr 23, 1951 Today's Date: 10/28/2023   History of Present Illness Pt is a 73 year old male admitted with illeus, AKI, Sepsis due to Suspected Intra-abdominal source    PMH significant for lumbar spinal stenosis L3-4, L4-5 laminotomy/foraminotomies 10/19/23, T2DM, PAF, systolic CHF EF 35% s/p ICD placement, CAD status post [name], HTN, HLD   Clinical Impression   Chart reviewed, nurse cleared pt for participation in OT evaluation. Co tx completed with PT on this date. Pt is alert and oriented, eager to return to PLOF. PTA pt required increased assist for ADL/IADL since surgery on 87/75/75 however prior to that was generally MOD I-I in ADL/IADL. Pt presents with deficits in strength, endurance, activity tolerance, balance all affecting safe and optimal ADL completion. MAX A +2 required for log roll, STS attempted with TOTAL A +2, but c/o R knee pain affecting ability to stand and pt unable to clear buttocks. Grooming tasks completed with SET UP seated in bed, LB dressing with MAX A. Vital signs appear stable throughout, pt does not report dizziness during mobility or seated on edge of bed for approx 8 minutes. All lines/leads, NG intact pre/post session. PT will benefit from acute OT to address deficits and to facilitate return to PLOF. OT will follow acutely.        If plan is discharge home, recommend the following: A lot of help with bathing/dressing/bathroom;A lot of help with walking and/or transfers;Assistance with cooking/housework;Assist for transportation;Help with stairs or ramp for entrance    Functional Status Assessment  Patient has had a recent decline in their functional status and demonstrates the ability to make significant improvements in function in a reasonable and predictable amount of time.  Equipment Recommendations  Other (comment) (defer to next venue of care)    Recommendations for  Other Services Rehab consult     Precautions / Restrictions Precautions Precautions: Back Precaution Comments: from surgery 12/24 Restrictions Weight Bearing Restrictions Per Provider Order: No      Mobility Bed Mobility Overal bed mobility: Needs Assistance Bed Mobility: Rolling, Sidelying to Sit, Sit to Sidelying Rolling: Max assist, +2 for physical assistance, +2 for safety/equipment Sidelying to sit: Max assist, +2 for physical assistance, +2 for safety/equipment     Sit to sidelying: Max assist, +2 for physical assistance, +2 for safety/equipment General bed mobility comments: frequent multi modal cues for technique    Transfers Overall transfer level: Needs assistance Equipment used: 2 person hand held assist Transfers: Sit to/from Stand Sit to Stand: Total assist (unable to clear buttocks off bed on this date due to reported  knee pain)                  Balance Overall balance assessment: Needs assistance Sitting-balance support: Feet supported Sitting balance-Leahy Scale: Fair Sitting balance - Comments: sitting on edge of bed for approx 8 minutes with CGA- supervision   Standing balance support: During functional activity Standing balance-Leahy Scale: Zero                             ADL either performed or assessed with clinical judgement   ADL Overall ADL's : Needs assistance/impaired     Grooming: Wash/dry face;Sitting;Set up           Upper Body Dressing : Maximal assistance;Sitting Upper Body Dressing Details (indicate cue type and reason): anticipate Lower Body Dressing: Maximal assistance;Sitting/lateral  leans Lower Body Dressing Details (indicate cue type and reason): donn/doff socks     Toileting- Clothing Manipulation and Hygiene: Maximal assistance;Bed level Toileting - Clothing Manipulation Details (indicate cue type and reason): anticipate, pt has foley             Vision Patient Visual Report: No change from  baseline       Perception         Praxis         Pertinent Vitals/Pain Pain Assessment Pain Assessment: Faces Faces Pain Scale: Hurts even more Pain Location: R knee Pain Descriptors / Indicators: Discomfort Pain Intervention(s): Monitored during session, Repositioned     Extremity/Trunk Assessment Upper Extremity Assessment Upper Extremity Assessment: Generalized weakness   Lower Extremity Assessment Lower Extremity Assessment: Generalized weakness;Defer to PT evaluation (sensation appears intact)       Communication Communication Communication: No apparent difficulties Cueing Techniques: Verbal cues;Visual cues   Cognition Arousal: Alert Behavior During Therapy: WFL for tasks assessed/performed Overall Cognitive Status: Within Functional Limits for tasks assessed                                       General Comments  a line, lines/leads, NG intact pre/post session    Exercises Other Exercises Other Exercises: edu re: role of OT, role of rehab, discharge recommendations   Shoulder Instructions      Home Living Family/patient expects to be discharged to:: Private residence Living Arrangements: Spouse/significant other Available Help at Discharge: Family Type of Home: House Home Access: Stairs to enter Secretary/administrator of Steps: 3   Home Layout: One level     Bathroom Shower/Tub: Tub/shower unit         Home Equipment: Rollator (4 wheels);Rolling Walker (2 wheels)          Prior Functioning/Environment Prior Level of Function : Independent/Modified Independent             Mobility Comments: decreased mobility after surgery, however prior to surgery amb home/community distances with MOD I ADLs Comments: MOD I-I in ADL/IADL prior to surgery 87/75; increased assist for all ADL/IADL PTA        OT Problem List: Decreased strength;Decreased activity tolerance;Impaired balance (sitting and/or standing);Decreased knowledge  of precautions;Decreased knowledge of use of DME or AE      OT Treatment/Interventions: Self-care/ADL training;Therapeutic exercise;Energy conservation;DME and/or AE instruction;Therapeutic activities;Balance training;Patient/family education    OT Goals(Current goals can be found in the care plan section) Acute Rehab OT Goals Patient Stated Goal: return to PLOF OT Goal Formulation: With patient Time For Goal Achievement: 11/11/23 Potential to Achieve Goals: Good ADL Goals Pt Will Perform Grooming: with modified independence;sitting Pt Will Perform Lower Body Dressing: with min assist;sitting/lateral leans Pt Will Transfer to Toilet: with modified independence;ambulating Pt Will Perform Toileting - Clothing Manipulation and hygiene: with modified independence;sit to/from stand  OT Frequency: Min 1X/week    Co-evaluation PT/OT/SLP Co-Evaluation/Treatment: Yes Reason for Co-Treatment: Complexity of the patient's impairments (multi-system involvement);For patient/therapist safety;To address functional/ADL transfers   OT goals addressed during session: ADL's and self-care      AM-PAC OT 6 Clicks Daily Activity     Outcome Measure Help from another person eating meals?: None Help from another person taking care of personal grooming?: None Help from another person toileting, which includes using toliet, bedpan, or urinal?: A Lot Help from another person bathing (including washing, rinsing, drying)?: A  Lot Help from another person to put on and taking off regular upper body clothing?: A Lot Help from another person to put on and taking off regular lower body clothing?: A Lot 6 Click Score: 16   End of Session Nurse Communication: Mobility status  Activity Tolerance: Patient tolerated treatment well Patient left: in bed;with call bell/phone within reach  OT Visit Diagnosis: Other abnormalities of gait and mobility (R26.89);Muscle weakness (generalized) (M62.81);Unsteadiness on feet  (R26.81)                Time: 9080-9054 OT Time Calculation (min): 26 min Charges:  OT General Charges $OT Visit: 1 Visit OT Evaluation $OT Eval High Complexity: 1 High  Therisa Sheffield, OTD OTR/L  10/28/23, 10:03 AM

## 2023-10-28 NOTE — Consult Note (Signed)
 PHARMACY - TOTAL PARENTERAL NUTRITION CONSULT NOTE   Indication: Prolonged ileus  Patient Measurements: Height: 6' 4 (193 cm) Weight: 112.8 kg (248 lb 10.9 oz) IBW/kg (Calculated) : 86.8 [name] AdjBW (KG): 93.4 Body mass index is 30.27 kg/m.  Assessment:   From HPI: 73 y/o M with history of CAD status post four-vessel [name] in 2017, hypertension, hyperlipidemia, non-insulin -dependent diabetes mellitus, history of lumbar stenosis status post spinal stenosis surgery on 10/18/2023.  Patient admitted with ileus and sepsis. Patient reportedly has not been eating since his surgery. Pharmacy consulted to initiate [name] for ileus.  Glucose / Insulin :  --Last Hgb A1c was 5.8% on 10/13/23 --BG 152 - 231 last 24h, 33u SSI required Electrolytes: Overall normal Renal: Patient presenting with AKI. Scr is down-trending Hepatic: Mild transaminitis which is improving. Albumin  low. Tbili normal Intake / Output; MIVF: + 1.7L for the admission. UOP 2.56L yesterday GI Imaging: 12/30 Abdominal x-ray: Dilated loops of gas-filled small bowel  GI Surgeries / Procedures: N/A  Central access:  PICC 12/31 [name] start date: 12/31  Nutritional Goals: Given shortage in products used for [name] compounding, parenteral nutrition will be provided to meet nutritional requirements as best as able based on current supply  RD Assessment: Estimated Needs Total Energy Estimated Needs: 2500-2800kcal/day Total Protein Estimated Needs: 125-140g/day Total Fluid Estimated Needs: 2.3-2.6L/day  Current Nutrition:  Clear liquids  Plan:  --Continue Clinimix  E 8/10 1L over 24h as continuous infusion and SMOFlipid  20% 250 mL over 12 hours Trial of clear liquids today. Patient had bowel movement. Hoping to stop [name] 10/29/23 --Electrolytes provided in standard concentrations as per Clinimix  E 8/10 formula --Add standard MVI and trace elements to [name] --Increase to Resistant q4h SSI and adjust as needed. Add 15u insulin  to [name]  today --Monitor [name] labs on Mon/Thurs, daily until stable  Marolyn KATHEE Mare 10/28/2023,8:01 AM

## 2023-10-28 NOTE — Evaluation (Signed)
 Physical Therapy Evaluation Patient Details Name: Jason [name] MRN: [id number] DOB: 05/22/1951 Today's Date: 10/28/2023  History of Present Illness  [name] is a 73 year old male with history of CAD status post four-vessel [name] in 2017, hypertension, hyperlipidemia, non-insulin -dependent diabetes mellitus, history of lumbar stenosis status post spinal stenosis surgery on 10/18/2023, who presents emergency department for chief concerns of generalized weakness since being discharged post spinal surgery, and intractable nausea and vomiting. Patient found to have ileus.   Clinical Impression  Patient received in bed, he is alert, oriented, and agrees to PT assessment. Patient is quite weak and requires max A +2 for supine to sit. He is also limited by pain in back and B knees from not moving for a few days. He is able to sit at edge of bed with supervision, attempted to stand with +2 hand held assist but was unable due to pain. He will continue to benefit from skilled PT to improve independence and return home. I anticipate he will make good progress in short amount of time based on his mobility prior to admission. Recommend > 3 hours of therapy a day.            If plan is discharge home, recommend the following: Two people to help with walking and/or transfers;A lot of help with bathing/dressing/bathroom   Can travel by private vehicle    no    Equipment Recommendations None recommended by PT  Recommendations for Other Services       Functional Status Assessment Patient has had a recent decline in their functional status and demonstrates the ability to make significant improvements in function in a reasonable and predictable amount of time.     Precautions / Restrictions Precautions Precautions: Back Precaution Comments: recent lumbar surgery, no brace needed Restrictions Weight Bearing Restrictions Per Provider Order: No      Mobility  Bed Mobility Overal bed mobility:  Needs Assistance Bed Mobility: Supine to Sit, Sit to Supine     Supine to sit: Max assist, +2 for physical assistance, +2 for safety/equipment, HOB elevated, Used rails Sit to supine: HOB elevated, Used rails, +2 for safety/equipment, +2 for physical assistance, Max assist   General bed mobility comments: raised HOB all the way up due to patient having A line in R wrist. Could not use that arm to assist with rolling    Transfers Overall transfer level: Needs assistance Equipment used: 2 person hand held assist Transfers: Sit to/from Stand Sit to Stand: Total assist, +2 physical assistance, +2 safety/equipment, From elevated surface           General transfer comment: patient unable to stand due to knee and back pain    Ambulation/Gait               General Gait Details: unable  Stairs            Wheelchair Mobility     Tilt Bed    Modified Rankin (Stroke Patients Only)       Balance Overall balance assessment: Needs assistance Sitting-balance support: Feet supported Sitting balance-Leahy Scale: Fair Sitting balance - Comments: sat eob with supervision                                     Pertinent Vitals/Pain Pain Assessment Pain Assessment: Faces Faces Pain Scale: Hurts even more Pain Location: B knees and back Pain Descriptors / Indicators: Discomfort,  Sore, Grimacing Pain Intervention(s): Monitored during session, Repositioned    Home Living Family/patient expects to be discharged to:: Private residence Living Arrangements: Spouse/significant other Available Help at Discharge: Family;Available 24 hours/day Type of Home: House Home Access: Stairs to enter Entrance Stairs-Rails: Doctor, General Practice of Steps: 3   Home Layout: One level Home Equipment: Rollator (4 wheels);Rolling Walker (2 wheels)      Prior Function Prior Level of Function : Needs assist             Mobility Comments: decreased mobility  after surgery,requiring a little assist from wife, however prior to surgery amb home/community distances with MOD I. Was driving pta. ADLs Comments: MOD I-I in ADL/IADL prior to surgery 87/75; increased assist for all ADL/IADL PTA     Extremity/Trunk Assessment   Upper Extremity Assessment Upper Extremity Assessment: Defer to OT evaluation    Lower Extremity Assessment Lower Extremity Assessment: Generalized weakness;LLE deficits/detail;RLE deficits/detail RLE Deficits / Details: patient reports knees are stiff and sore, unable to lift against gravity RLE Coordination: decreased gross motor LLE Deficits / Details: knees stiff and sore, unable to lift against gravity LLE Coordination: decreased gross motor    Cervical / Trunk Assessment Cervical / Trunk Assessment: Back Surgery  Communication   Communication Communication: No apparent difficulties Cueing Techniques: Verbal cues  Cognition Arousal: Alert Behavior During Therapy: WFL for tasks assessed/performed Overall Cognitive Status: Within Functional Limits for tasks assessed                                          General Comments General comments (skin integrity, edema, etc.): a line, lines/leads, NG intact pre/post session    Exercises Other Exercises Other Exercises: Instructed patient in heel slides in bed to improve knee rom and decrease pain   Assessment/Plan    PT Assessment Patient needs continued PT services  PT Problem List Decreased strength;Decreased range of motion;Decreased activity tolerance;Decreased balance;Decreased mobility;Pain;Decreased skin integrity       PT Treatment Interventions DME instruction;Gait training;Stair training;Functional mobility training;Therapeutic activities;Therapeutic exercise;Patient/family education    PT Goals (Current goals can be found in the Care Plan section)  Acute Rehab PT Goals Patient Stated Goal: to return home PT Goal Formulation: With  patient Time For Goal Achievement: 11/11/23 Potential to Achieve Goals: Good    Frequency Min 1X/week     Co-evaluation PT/OT/SLP Co-Evaluation/Treatment: Yes Reason for Co-Treatment: To address functional/ADL transfers;For patient/therapist safety PT goals addressed during session: Mobility/safety with mobility;Balance OT goals addressed during session: ADL's and self-care       AM-PAC PT 6 Clicks Mobility  Outcome Measure Help needed turning from your back to your side while in a flat bed without using bedrails?: A Lot Help needed moving from lying on your back to sitting on the side of a flat bed without using bedrails?: A Lot Help needed moving to and from a bed to a chair (including a wheelchair)?: Total Help needed standing up from a chair using your arms (e.g., wheelchair or bedside chair)?: Total Help needed to walk in hospital room?: Total Help needed climbing 3-5 steps with a railing? : Total 6 Click Score: 8    End of Session   Activity Tolerance: Patient limited by pain;Other (comment) (weakness) Patient left: in bed;with call bell/phone within reach Nurse Communication: Mobility status PT Visit Diagnosis: Other abnormalities of gait and mobility (R26.89);Muscle weakness (generalized) (M62.81);Pain  Pain - Right/Left:  (B) Pain - part of body: Knee (Back)    Time: 9080-9054 PT Time Calculation (min) (ACUTE ONLY): 26 min   Charges:   PT Evaluation $PT Eval Moderate Complexity: 1 Mod   PT General Charges $$ ACUTE PT VISIT: 1 Visit         Shaconda Hajduk, PT, [facility] 10/28/23,10:29 AM

## 2023-10-28 NOTE — Progress Notes (Signed)
 NAME:  Jason [name], MRN:  [id number], DOB:  [birth year], LOS: 4 ADMISSION DATE:  10/24/2023, CONSULTATION DATE:  10/25/23 REFERRING MD: Jawo, Modou NP CHIEF COMPLAINT: Generalized weakness   HPI  73 y.o with significant PMH of lumbar spinal stenosis L3-4, L4-5 laminotomy/foraminotomies, T2DM, PAF, systolic CHF EF 35% s/p ICD placement, CAD status post [name], HTN, HLD who presented to the ED with chief complaints of generalized weakness with associated nausea and vomiting since discharge from the hospital following admission for uncomplicated L3-4, L4-5 laminotomy and foraminotomies on 10/19/2023.   Past Medical History  Lumbar spinal stenosis L3-4, L4-5 laminotomy/foraminotomies, T2DM, PAF, systolic CHF EF 35% s/p ICD placement, CAD status post [name], HTN, HLD  Significant Hospital Events   12/29: Admitted to MedSurg unit with suspected postop ileus NG tube placed for decompression.  Overnight became hypoglycemic requiring dextrose  infusion 12/30: Rapid response called for unresponsiveness, hypoglycemia in the low 20s and hypertension (70/37).  Transferred to ICU PCCM consulted 10/26/23 -patient critically ill, on levophed , has not had nourishment in >1 wk.  Plan for PICC. Blood cultures pending, renal function improved but still severe AKI, repeat KUB today showed OGT got displaced to airway , this was replaced and repeated with repeat imaging showing good placement.  10/26/22- patient more oriented, continues to require levophed .  Sacral wound covered.  Levophed  and D5-1/2NS started.   10/27/22- patient had BM overnight, ilius with 6.1cm dilated loops of bowel on KUB this am with NG to suction still, abdomen distended.  [name] ongoing.  PICC line with [name] ongoing. TTE reassuring, remains on 2 vasopressors.  Discussed with dietary and will try trickle clears today due to clinically less abd tenderness.   Consults:  PCCM  Procedures:  12/30 right femoral art line 12/30 right femoral central  line  Significant Diagnostic Tests:  12/29: Chest Xray> IMPRESSION: 1. Minor left basilar atelectasis. Otherwise no acute cardiopulmonary findings. 2. Gaseous distension of bowel within the included upper abdomen.  12/30: Noncontrast CT head> IMPRESSION: No acute intracranial abnormality.  12/30: CTA abdomen and pelvis> IMPRESSION: 1. Findings favoring ileus with persistent air and fluid distension of the small bowel, ascending, and transverse. There is no discrete transition point. Enteric tube decompresses the stomach with diminished gastric distension from yesterday. 2. No free air or mesenteric gas. 3. Progressive atelectasis in the medial left greater than right lower lobes. 4. Moderate-sized fat containing umbilical hernia.    Micro Data:  12/29: Blood culture x2> 12/30: MRSA PCR>>   Antimicrobials:  Azithromycin  12/29 Ceftriaxone  12/29  OBJECTIVE  Blood pressure (!) 109/56, pulse (!) 106, temperature 97.9 F (36.6 C), temperature source Oral, resp. rate (!) 21, height 6' 4 (1.93 m), weight 112.8 kg, SpO2 91%.       Intake/Output Summary (Last 24 hours) at 10/28/2023 0901 Last data filed at 10/28/2023 0751 Gross per 24 hour  Intake 3815.42 ml  Output 3370 ml  Net 445.42 ml   Filed Weights   10/26/23 0500 10/27/23 0500 10/28/23 0500  Weight: 113.3 kg 113.3 kg 112.8 kg    Physical Examination  GENERAL: 73 year-old critically ill patient lying in the bed in no acute distress EYES: PEERLA. No scleral icterus. Extraocular muscles intact.  HEENT: Head atraumatic, normocephalic. Oropharynx and nasopharynx clear.  NECK:  No JVD, supple  LUNGS: Normal breath sounds bilaterally.  No use of accessory muscles of respiration.  CARDIOVASCULAR: S1, S2 normal. No murmurs, rubs, or gallops.  ABDOMEN: Soft, NTND EXTREMITIES:  trace swelling.  Capillary refill < 3 seconds in all extremities. Pulses palpable distally. NEUROLOGIC: The patient is  oriented to self only. No  focal neurological deficit appreciated. Cranial nerves are intact.  SKIN: No obvious rash, lesion, or ulcer. Warm to touch Labs/imaging that I havepersonally reviewed  (right click and Reselect all SmartList Selections daily)     Labs   CBC: Recent Labs  Lab 10/24/23 0700 10/25/23 0205 10/26/23 0419 10/27/23 0347 10/28/23 0510  WBC 11.1* 6.4 7.2 7.1 8.3  HGB 13.9 11.8* 11.4* 11.7* 11.8*  HCT 40.5 33.1* 32.8* 34.1* 34.6*  MCV 90.4 87.1 87.0 90.5 92.0  PLT 244 143* 131* 129* 135*    Basic Metabolic Panel: Recent Labs  Lab 10/24/23 0700 10/25/23 0205 10/25/23 1319 10/26/23 0419 10/27/23 0347 10/28/23 0510  NA 130* 130* 132* 133* 138 138  K 4.4 4.3 4.7 4.2 3.8 3.8  CL 93* 100 101 100 103 106  CO2 13* 18* 16* 21* 23 22  GLUCOSE 166* 81 189* 210* 151* 246*  BUN 89* 136* 98* 96* 103* 107*  CREATININE 3.81* 4.31* 3.83* 3.60* 4.26* 3.95*  CALCIUM  9.5 7.7* 7.9* 8.1* 8.2* 8.1*  MG  --   --   --  2.0 2.3 2.2  PHOS 4.2  --   --  3.5 3.2 3.1   GFR: Estimated Creatinine Clearance: 22.9 mL/min (A) (by C-G formula based on SCr of 3.95 mg/dL (H)). Recent Labs  Lab 10/24/23 1314 10/24/23 1620 10/25/23 0205 10/25/23 0821 10/26/23 0419 10/26/23 1154 10/27/23 0347 10/27/23 1340 10/28/23 0510  PROCALCITON 11.21  --  35.61  --  51.30  --   --   --   --   WBC  --   --  6.4  --  7.2  --  7.1  --  8.3  LATICACIDVEN 8.4*   < > 4.2* 3.1*  --  1.4  --  0.9  --    < > = values in this interval not displayed.    Liver Function Tests: Recent Labs  Lab 10/24/23 0700 10/25/23 0205 10/26/23 0419 10/27/23 0347 10/28/23 0510  AST 84* 227* 131* 78* 40  ALT 118* 147* 125* 95* 70*  ALKPHOS 57 43 50 45 46  BILITOT 1.9* 0.8 1.0 0.8 0.8  PROT 7.2 5.3* 5.1* 4.8* 5.0*  ALBUMIN  3.6 2.7* 2.4* 2.2* 2.1*   Recent Labs  Lab 10/26/23 1237  LIPASE 19   No results for input(s): AMMONIA in the last 168 hours.  ABG    Component Value Date/Time   PHART 7.35 10/25/2023 0317   PCO2ART  27 (L) 10/25/2023 0317   PO2ART 75 (L) 10/25/2023 0317   HCO3 14.9 (L) 10/25/2023 0317   TCO2 26 04/15/2016 1707   ACIDBASEDEF 9.1 (H) 10/25/2023 0317   O2SAT 97.1 10/25/2023 0317     Coagulation Profile: Recent Labs  Lab 10/27/23 2134  INR 1.2    Cardiac Enzymes: No results for input(s): CKTOTAL, CKMB, CKMBINDEX, TROPONINI in the last 168 hours.  HbA1C: Hgb A1c MFr Bld  Date/Time Value Ref Range Status  10/13/2023 12:00 PM 5.8 (H) 4.8 - 5.6 % Final    Comment:    (NOTE) Pre diabetes:          5.7%-6.4%  Diabetes:              >6.4%  Glycemic control for   <7.0% adults with diabetes   04/13/2016 06:37 PM 7.6 (H) 4.8 - 5.6 % Final  Comment:    (NOTE)         Pre-diabetes: 5.7 - 6.4         Diabetes: >6.4         Glycemic control for adults with diabetes: <7.0     CBG: Recent Labs  Lab 10/27/23 1933 10/27/23 2040 10/28/23 0000 10/28/23 0413 10/28/23 0724  GLUCAP 225* 231* 206* 231* 231*    Review of Systems:   Unable to be obtained secondary to the patient's intubated altered mental status.   Past Medical History  He,  has a past medical history of AICD (automatic cardioverter/defibrillator) present, Arthritis, CHF (congestive heart failure) (HCC), Coronary artery disease involving native coronary artery with unstable angina pectoris (HCC) (04/13/2016), Coronary artery disease with history of myocardial infarction without history of [name], Cough, Diabetes mellitus without complication (HCC), Edema, Essential hypertension, Hypertension, Hypothyroidism, Left main coronary artery disease (04/13/2016), Mixed hyperlipidemia, Obesity, PAF (paroxysmal atrial fibrillation) (HCC), S/P [name] (04/14/2016), Type II diabetes mellitus (HCC), and Unstable angina (HCC) (04/13/2016).   Surgical History    Past Surgical History:  Procedure Laterality Date   BACK SURGERY     Lumbar Surgeries. [place], [place]. Dr. Drury x2   CARDIAC CATHETERIZATION Left  04/13/2016   Procedure: Left Heart Cath and Coronary Angiography;  Surgeon: Wolm JINNY Rhyme, MD;  Location: [facility] INVASIVE CV LAB;  Service: Cardiovascular;  Laterality: Left;   CARDIOVERSION N/A 08/02/2018   Procedure: CARDIOVERSION;  Surgeon: Rhyme Wolm JINNY, MD;  Location: [facility] ORS;  Service: Cardiovascular;  Laterality: N/A;   CATARACT EXTRACTION W/PHACO Right 01/23/2016   Procedure: CATARACT EXTRACTION PHACO AND INTRAOCULAR LENS PLACEMENT (IOC);  Surgeon: Elsie Carmine, MD;  Location: [facility] ORS;  Service: Ophthalmology;  Laterality: Right;  US  01:18 AP% 23.8 CDE 18.67 fluid pack lot # [id number]   CHOLECYSTECTOMY     CORONARY ARTERY BYPASS GRAFT N/A 04/14/2016   Procedure: CORONARY ARTERY BYPASS GRAFTING TIMES FOUR    USING LEFT INTERNAL MAMMARY AND ENDOSCOPIC HARVEST RIGHT SAPHENOUS VEIN;  Surgeon: Sudie VEAR Laine, MD;  Location: [facility] OR;  Service: Open Heart Surgery;  Laterality: N/A;   EYE SURGERY Bilateral    Cataract Extraction with IOL implant.   INTRAOPERATIVE TRANSESOPHAGEAL ECHOCARDIOGRAM N/A 04/14/2016   Procedure: INTRAOPERATIVE TRANSESOPHAGEAL ECHOCARDIOGRAM;  Surgeon: Sudie VEAR Laine, MD;  Location: [facility];  Service: Open Heart Surgery;  Laterality: N/A;   KNEE ARTHROSCOPY Left 10/08/2015   Procedure: ARTHROSCOPY KNEE, PARTIAL MEDIAL AND LATERAL MENISECTOMY, REMOVAL OF FOREIGN BODY;  Surgeon: Ozell Flake, MD;  Location: [facility] ORS;  Service: Orthopedics;  Laterality: Left;   LUMBAR LAMINECTOMY/DECOMPRESSION MICRODISCECTOMY Bilateral 10/18/2023   Procedure: Lumbar Three-Four, Lumbar Four-Five  Laminotomy, Foraminotomy;  Surgeon: Mavis Purchase, MD;  Location: [facility];  Service: Neurosurgery;  Laterality: Bilateral;  3C     Social History   reports that he has never smoked. His smokeless tobacco use includes chew. He reports that he does not drink alcohol and does not use drugs.   Family History   His family history includes Aortic stenosis in his father; Heart disease in his  father; Hypertension in his father.   Allergies Allergies  Allergen Reactions   Aspirin Anaphylaxis   Inderal [Propranolol]     Other reaction(s): Other (See Comments) Fatigue   Metformin  Hcl Diarrhea    Pt still takes medication   Mevacor [Lovastatin] Other (See Comments)    muscle Pain   Penicillin G Hives   Pravachol [Pravastatin Sodium] Other (See Comments)  Muscle Pain   Zocor [Simvastatin] Other (See Comments)    Muscle Pain   Penicillins Rash    Has patient had a PCN reaction causing immediate rash, facial/tongue/throat swelling, SOB or lightheadedness with hypotension: Yes Has patient had a PCN reaction causing severe rash involving mucus membranes or skin necrosis: Yes Has patient had a PCN reaction that required hospitalization No Has patient had a PCN reaction occurring within the last 10 years: Yes If all of the above answers are NO, then may proceed with Cephalosporin use.      Home Medications  Prior to Admission medications   Medication Sig Start Date End Date Taking? Authorizing Provider  acetaminophen  (TYLENOL ) 500 MG tablet Take 500-1,000 mg by mouth every 6 (six) hours as needed (pain.).    [provider]  amiodarone  (PACERONE ) 200 MG tablet Take 100 mg by mouth in the morning.    [provider]  apixaban  (ELIQUIS ) 5 MG TABS tablet Take 1 tablet (5 mg total) by mouth 2 (two) times daily. 10/25/23   Johnanna Credit Caylin, PA-C  cyclobenzaprine  (FLEXERIL ) 10 MG tablet Take 1 tablet (10 mg total) by mouth 3 (three) times daily as needed for muscle spasms. 10/19/23   Tomlinson, Sara Caylin, PA-C  dapagliflozin  propanediol (FARXIGA ) 10 MG TABS tablet Take 10 mg by mouth in the morning.    [provider]  docusate sodium  (COLACE) 100 MG capsule Take 1 capsule (100 mg total) by mouth 2 (two) times daily. 10/19/23 10/18/24  Johnanna Credit Caylin, PA-C  gabapentin  (NEURONTIN ) 100 MG capsule Take 100 mg by mouth at bedtime as needed  (nerve pain.). 06/30/23   [provider]  glimepiride  (AMARYL ) 4 MG tablet Take 4 mg by mouth 2 (two) times daily. Reported on 04/13/2016    [provider]  levothyroxine  (SYNTHROID ) 50 MCG tablet Take 50 mcg by mouth daily at 2 am. 08/08/23   [provider]  metFORMIN  (GLUCOPHAGE ) 1000 MG tablet Take 1,000 mg by mouth in the morning and at bedtime.    [provider]  metoprolol  succinate (TOPROL -XL) 100 MG 24 hr tablet Take 100 mg by mouth in the morning. Take with or immediately following a meal.    [provider]  Oxycodone  HCl 10 MG TABS Take 1 tablet (10 mg total) by mouth every 4 (four) hours as needed for severe pain (pain score 7-10). 10/19/23   Johnanna Credit Caylin, PA-C  rosuvastatin  (CRESTOR ) 40 MG tablet Take 20 mg by mouth at bedtime. 07/01/23   [provider]  sacubitril -valsartan  (ENTRESTO ) 49-51 MG Take 1 tablet by mouth 2 (two) times daily.    [provider]  torsemide  (DEMADEX ) 20 MG tablet Take 20 mg by mouth in the morning. 11/23/22   [provider]  Scheduled Meds:  Chlorhexidine  Gluconate Cloth  6 each Topical Daily   insulin  aspart  0-20 Units Subcutaneous Q4H   [START ON 10/31/2023] levothyroxine   37.5 mcg Intravenous Daily   lidocaine   1 patch Transdermal Q24H   sodium chloride  flush  10-40 mL Intracatheter Q12H   thiamine  (VITAMIN B1) injection  100 mg Intravenous Q24H   Continuous Infusions:  amiodarone  30 mg/hr (10/28/23 0700)   heparin  1,950 Units/hr (10/28/23 0700)   lactated ringers  50 mL/hr at 10/28/23 0700   norepinephrine  (LEVOPHED ) Adult infusion 9 mcg/min (10/28/23 0700)   piperacillin -tazobactam (ZOSYN )  IV Stopped (10/28/23 9394)   [name] (CLINIMIX -E) Adult 42 mL/hr at 10/28/23 0700   vasopressin  0.03 Units/min (10/28/23 0700)  PRN Meds:.acetaminophen  **OR** [DISCONTINUED] acetaminophen , dextrose , fentaNYL  (SUBLIMAZE ) injection, ondansetron  **OR** ondansetron  (ZOFRAN ) IV, mouth  rinse, polyethylene glycol, senna-docusate, sodium chloride  flush   Active Hospital Problem list   See systems below  Assessment & Plan:   #Ileus Suspect post op related due to resent spinal surgery Repeat CT abd/pelvis ileus with persistent air and fluid distension of the small bowel, ascending, and transverse. -Keep Npo -Gentle IVFs -NGT for decompression -Surgery consult- Discussed case with Dr Desiderio unofficially does not appear to need surgical intervention at this moment.   -Hold narcotics -s/p [name] will try clear PO diet low dose   #Sepic shock-  due to Suspected Intra-abdominal source-PRESENT ON ADMISSION -sacral wounds present on admission  -levophed  and vasopressin   -F/u cultures, trend lactic/ PCT -Monitor WBC/ fever curve -IV antibiotics: ceftriaxone  and Azithromycin >>ZOSYN  Iv -IVF hydration as needed -Pressors for MAP goal >65 -Strict I/O's  #AKI Likely ATN~improved  #Hyponatremia #NAGMA with Lactic Acidosis -Trend Lactate -Avoid nephrotoxic agents  -Follow BMP -Replace electrolytes as indicated -Nephrology consult   #Elevated LFTs -shock liver in the setting of severe sepsis with hypotension -Trend cmp  #Mildly Elevated Troponin, suspect demand ischemia #HFrEF  EF 35% s/p ICD placement  #Hypertension PMHx: CAD s/p [name] X4, PAF, HLD BNP mildly elevated without overt signs of volume overload or pulmonary edema -Trend HS Troponin until peaked -Hold torsemide , Entresto , metoprolol , Farxiga  and amiodarone   -Continue rosuvastatin  once able to tolerate p.o. -Hold Eliquis  for now -Obtain 2D echo  #T2 Diabetes mellitus  -Check hemoglobin A1c -CBGs -Sliding scale insulin  -Follow ICU hyper/hypoglycemia protocol -Hold metformin  and glimepiride    #Chronic Pain #Hx of Chronic Bilateral Low Back Pain  #hx of severe lumbar stenosis from L3-L5 with right lower extremity radiculopathy s/p  L3-4, L4-5 laminotomy and foraminotomies on 10/19/2023  -Follows with  Neurosugeon  -on Gabapentin  and Flexeril , and nerve block PRN  #Hypothyroidism -Check TSH and free T4 -Continue home Synthroid   Best practice:  Diet:  NPO Pain/Anxiety/Delirium protocol (if indicated): No VAP protocol (if indicated): Not indicated DVT prophylaxis: Subcutaneous Heparin  GI prophylaxis: PPI Glucose control:  SSI No Central venous access:  Yes, and it is still needed Arterial line:  Yes, and it is still needed Foley:  N/A Mobility:  bed rest  PT consulted: N/A Last date of multidisciplinary goals of care discussion [updated wife over the phone] Code Status:  full code Disposition: ICU   = Goals of Care = Code Status Order: FULL  Primary Emergency Contact: Mandala,Katie V, Home Phone: [phone] Wishes to pursue full aggressive treatment and intervention options, including CPR and intubation, but goals of care will be addressed on going with family if that should become necessary.  Critical care provider statement:   Total critical care time: 33 minutes   Performed by: Parris MD   Critical care time was exclusive of separately billable procedures and treating other patients.   Critical care was necessary to treat or prevent imminent or life-threatening deterioration.   Critical care was time spent personally by me on the following activities: development of treatment plan with patient and/or surrogate as well as nursing, discussions with consultants, evaluation of patient's response to treatment, examination of patient, obtaining history from patient or surrogate, ordering and performing treatments and interventions, ordering and review of laboratory studies, ordering and review of radiographic studies, pulse oximetry and re-evaluation of patient's condition.    Jashae Wiggs, M.D.  Pulmonary & Critical Care Medicine

## 2023-10-29 ENCOUNTER — Inpatient Hospital Stay: Payer: Medicare Other

## 2023-10-29 LAB — CBC
HCT: 32.6 % — ABNORMAL LOW (ref 39.0–52.0)
Hemoglobin: 11.3 g/dL — ABNORMAL LOW (ref 13.0–17.0)
MCH: 31 pg (ref 26.0–34.0)
MCHC: 34.7 g/dL (ref 30.0–36.0)
MCV: 89.3 fL (ref 80.0–100.0)
Platelets: 129 10*3/uL — ABNORMAL LOW (ref 150–400)
RBC: 3.65 MIL/uL — ABNORMAL LOW (ref 4.22–5.81)
RDW: 13.4 % (ref 11.5–15.5)
WBC: 9.5 10*3/uL (ref 4.0–10.5)
nRBC: 0 % (ref 0.0–0.2)

## 2023-10-29 LAB — CULTURE, BLOOD (ROUTINE X 2)
Culture: NO GROWTH
Culture: NO GROWTH
Special Requests: ADEQUATE

## 2023-10-29 LAB — BASIC METABOLIC PANEL
Anion gap: 10 (ref 5–15)
BUN: 93 mg/dL — ABNORMAL HIGH (ref 8–23)
CO2: 21 mmol/L — ABNORMAL LOW (ref 22–32)
Calcium: 8.3 mg/dL — ABNORMAL LOW (ref 8.9–10.3)
Chloride: 111 mmol/L (ref 98–111)
Creatinine, Ser: 3.44 mg/dL — ABNORMAL HIGH (ref 0.61–1.24)
GFR, Estimated: 18 mL/min — ABNORMAL LOW (ref >60)
Glucose, Bld: 177 mg/dL — ABNORMAL HIGH (ref 70–99)
Potassium: 3.6 mmol/L (ref 3.5–5.1)
Sodium: 142 mmol/L (ref 135–145)

## 2023-10-29 LAB — GLUCOSE, CAPILLARY
Glucose-Capillary: 151 mg/dL — ABNORMAL HIGH (ref 70–99)
Glucose-Capillary: 165 mg/dL — ABNORMAL HIGH (ref 70–99)
Glucose-Capillary: 179 mg/dL — ABNORMAL HIGH (ref 70–99)
Glucose-Capillary: 182 mg/dL — ABNORMAL HIGH (ref 70–99)
Glucose-Capillary: 188 mg/dL — ABNORMAL HIGH (ref 70–99)
Glucose-Capillary: 213 mg/dL — ABNORMAL HIGH (ref 70–99)
Glucose-Capillary: 91 mg/dL (ref 70–99)

## 2023-10-29 LAB — HEPARIN LEVEL (UNFRACTIONATED)
Heparin Unfractionated: 0.25 [IU]/mL — ABNORMAL LOW (ref 0.30–0.70)
Heparin Unfractionated: 0.28 [IU]/mL — ABNORMAL LOW (ref 0.30–0.70)
Heparin Unfractionated: 0.32 [IU]/mL (ref 0.30–0.70)

## 2023-10-29 LAB — MAGNESIUM: Magnesium: 2 mg/dL (ref 1.7–2.4)

## 2023-10-29 LAB — PHOSPHORUS: Phosphorus: 3 mg/dL (ref 2.5–4.6)

## 2023-10-29 MED ORDER — BOOST / RESOURCE BREEZE PO LIQD CUSTOM
1.0000 | Freq: Three times a day (TID) | ORAL | Status: DC
  Administered 2023-10-29 – 2023-11-01 (×5): 1 via ORAL

## 2023-10-29 MED ORDER — LACTATED RINGERS IV BOLUS
1000.0000 mL | Freq: Once | INTRAVENOUS | Status: AC
  Administered 2023-10-29: 1000 mL via INTRAVENOUS

## 2023-10-29 MED ORDER — HEPARIN BOLUS VIA INFUSION
1650.0000 [IU] | Freq: Once | INTRAVENOUS | Status: AC
Start: 2023-10-29 — End: 2023-10-29
  Administered 2023-10-29: 1650 [IU] via INTRAVENOUS
  Filled 2023-10-29: qty 1650

## 2023-10-29 MED ORDER — LACTATED RINGERS IV BOLUS: 500.0000 mL | Freq: Once | INTRAVENOUS | Status: DC

## 2023-10-29 MED ORDER — CAPSAICIN 0.025 % EX CREA
1.0000 | TOPICAL_CREAM | Freq: Two times a day (BID) | CUTANEOUS | Status: DC
  Administered 2023-10-29 – 2023-11-04 (×13): 1 via TOPICAL
  Filled 2023-10-29 (×2): qty 1

## 2023-10-29 MED ORDER — HEPARIN BOLUS VIA INFUSION
1650.0000 [IU] | Freq: Once | INTRAVENOUS | Status: AC
  Administered 2023-10-29: 1650 [IU] via INTRAVENOUS
  Filled 2023-10-29: qty 1650

## 2023-10-29 MED ORDER — GERHARDT'S BUTT CREAM
TOPICAL_CREAM | Freq: Two times a day (BID) | CUTANEOUS | Status: DC
  Administered 2023-11-03: 1 via TOPICAL
  Filled 2023-10-29 (×2): qty 60

## 2023-10-29 MED ORDER — LEVOTHYROXINE SODIUM 50 MCG PO TABS
50.0000 ug | ORAL_TABLET | Freq: Every day | ORAL | Status: DC
  Administered 2023-10-30 – 2023-11-04 (×6): 50 ug via ORAL
  Filled 2023-10-29 (×6): qty 1

## 2023-10-29 NOTE — Progress Notes (Signed)
 ANTICOAGULATION CONSULT NOTE  Pharmacy Consult for Heparin  Infusion Indication: atrial fibrillation  Patient Measurements: Height: 6' 4 (193 cm) Weight: 112.8 kg (248 lb 10.9 oz) IBW/kg (Calculated) : 86.8 Heparin  Dosing Weight: 109.9 kg  Labs: Recent Labs    10/26/23 0419 10/27/23 0347 10/27/23 2134 10/28/23 0510 10/28/23 1451 10/29/23 0015  HGB 11.4* 11.7*  --  11.8*  --   --   HCT 32.8* 34.1*  --  34.6*  --   --   PLT 131* 129*  --  135*  --   --   APTT  --   --  29  --   --   --   LABPROT  --   --  15.0  --   --   --   INR  --   --  1.2  --   --   --   HEPARINUNFRC  --   --   --  <0.10* 0.20* 0.25*  CREATININE 3.60* 4.26*  --  3.95*  --   --     Estimated Creatinine Clearance: 22.9 mL/min (A) (by C-G formula based on SCr of 3.95 mg/dL (H)).   Medical History: Past Medical History:  Diagnosis Date   AICD (automatic cardioverter/defibrillator) present    [employer]   Arthritis    CHF (congestive heart failure) (HCC)    Coronary artery disease involving native coronary artery with unstable angina pectoris (HCC) 04/13/2016   Coronary artery disease with history of myocardial infarction without history of [name]    Dr. Quin   Cough    CHRONIC   Diabetes mellitus without complication (HCC)    Edema    FEET/LEGS   Essential hypertension    Hypertension    Hypothyroidism    Left main coronary artery disease 04/13/2016   Mixed hyperlipidemia    Obesity    PAF (paroxysmal atrial fibrillation) (HCC)    S/P [name] 04/14/2016   LIMA to LAD, SVG to D1, SVG to OM, SVG to PDA, EVH via right thigh and leg   Type II diabetes mellitus (HCC)    Unstable angina (HCC) 04/13/2016    Assessment: Pt is a 73 yo male admitted on 10/25/23 with ileus, with h/o A fib on Eliquis  5 mg BID.  Eliquis  not resumes since admission, pt ordered heparin  5000 units sub-Q q8hr starting 12/29, last dose 10/26/22 @ 1327.  Goal of Therapy:  Heparin  level 0.3-0.7 units/ml Monitor platelets by  anticoagulation protocol: Yes   Plan:  1/3:  HL @ 0015 = 0.25, SUBtherapeutic - Will order heparin  1650 units IV X 1 bolus and increase drip rate to 2350 units/hr --Re-check HL 8 hours from rate change --Daily CBC per protocol while on IV heparin   Kiannah Grunow D 10/29/2023 1:04 AM

## 2023-10-29 NOTE — Progress Notes (Signed)
 Occupational Therapy Treatment Patient Details Name: Jason [name] MRN: [id number] DOB: [birth year] Today's Date: 10/29/2023   History of present illness Jason [name] is a 73 year old male with history of CAD status post four-vessel [name] in 2017, hypertension, hyperlipidemia, non-insulin -dependent diabetes mellitus, history of lumbar stenosis status post spinal stenosis surgery on 10/18/2023, who presents emergency department for chief concerns of generalized weakness since being discharged post spinal surgery, and intractable nausea and vomiting. Patient found to have ileus.   OT comments  Chart reviewed, pt greeted in bed with jello on his sheets and gown. Pt is oriented x4 however increased processing required throughout on this date. Pt has had multiple Bms today and reports fatigue, also R knee pain limiting mobility on this date. Pt performed rolling with MOD-MAX A+2 multiple attempts after incontinent BM. TOTAL A for peri care. MAX A for donning gown, Pt performed grooming with MIN A. Educated pt on progress, pressure relief, progressing mobility. Pt is left with pillow under R hip for pressure relief, all needs met. OT will continue to follow acutely.       If plan is discharge home, recommend the following:  A lot of help with bathing/dressing/bathroom;A lot of help with walking and/or transfers;Assistance with cooking/housework;Assist for transportation;Help with stairs or ramp for entrance   Equipment Recommendations  Other (comment) (defer)    Recommendations for Other Services      Precautions / Restrictions Precautions Precautions: Back Precaution Comments: recent lumbar surgery, no brace needed Restrictions Weight Bearing Restrictions Per Provider Order: No       Mobility Bed Mobility Overal bed mobility: Needs Assistance Bed Mobility: Rolling Rolling: Mod assist, Max assist, +2 for physical assistance, +2 for safety/equipment, Used rails         General bed  mobility comments: limited by R knee pain    Transfers                         Balance                                           ADL either performed or assessed with clinical judgement   ADL Overall ADL's : Needs assistance/impaired     Grooming: Wash/dry face;Sitting;Minimal assistance       Lower Body Bathing: Maximal assistance;Total assistance;Bed level   Upper Body Dressing : Maximal assistance;Sitting   Lower Body Dressing: Total assistance;Bed level       Toileting- Clothing Manipulation and Hygiene: Maximal assistance;+2 for physical assistance;+2 for safety/equipment Toileting - Clothing Manipulation Details (indicate cue type and reason): after incontient of BM       General ADL Comments: limited by fatigue on this date, pt with multiple BMs    Extremity/Trunk Assessment              Vision       Perception     Praxis      Cognition Arousal: Alert Behavior During Therapy: WFL for tasks assessed/performed Overall Cognitive Status: No family/caregiver present to determine baseline cognitive functioning Area of Impairment: Awareness, Problem solving                           Awareness: Emergent Problem Solving: Slow processing, Requires verbal cues, Requires tactile cues          Exercises Other  Exercises Other Exercises: edu re: role of OT, importance of continuing to progress mobility    Shoulder Instructions       General Comments all lines/leads intact pre/post session    Pertinent Vitals/ Pain       Pain Assessment Pain Assessment: Faces Faces Pain Scale: Hurts even more Pain Location: B knees and back Pain Descriptors / Indicators: Discomfort, Grimacing, Guarding, Moaning Pain Intervention(s): Monitored during session, Repositioned  Home Living                                          Prior Functioning/Environment              Frequency  Min 1X/week         Progress Toward Goals  OT Goals(current goals can now be found in the care plan section)  Progress towards OT goals: Progressing toward goals  Acute Rehab OT Goals Time For Goal Achievement: 11/11/23  Plan      Co-evaluation    PT/OT/SLP Co-Evaluation/Treatment: Yes Reason for Co-Treatment: For patient/therapist safety;To address functional/ADL transfers PT goals addressed during session: Mobility/safety with mobility OT goals addressed during session: ADL's and self-care      AM-PAC OT 6 Clicks Daily Activity     Outcome Measure   Help from another person eating meals?: A Little Help from another person taking care of personal grooming?: A Little Help from another person toileting, which includes using toliet, bedpan, or urinal?: A Lot Help from another person bathing (including washing, rinsing, drying)?: A Lot Help from another person to put on and taking off regular upper body clothing?: A Lot Help from another person to put on and taking off regular lower body clothing?: A Lot 6 Click Score: 14    End of Session    OT Visit Diagnosis: Other abnormalities of gait and mobility (R26.89);Muscle weakness (generalized) (M62.81);Unsteadiness on feet (R26.81)   Activity Tolerance Patient limited by fatigue   Patient Left in bed;with call bell/phone within reach;with bed alarm set   Nurse Communication Mobility status        Time: 8645-8577 OT Time Calculation (min): 28 min  Charges: OT General Charges $OT Visit: 1 Visit OT Treatments $Self Care/Home Management : 8-22 mins  Therisa Sheffield, OTD OTR/L  10/29/23, 4:25 PM

## 2023-10-29 NOTE — Plan of Care (Signed)

## 2023-10-29 NOTE — TOC CM/SW Note (Signed)
 Following for CIR eval.  Aarian Griffie, LCSW Transitions of Care Department [phone]

## 2023-10-29 NOTE — Progress Notes (Signed)
 [facility]  ROUNDING NOTE   Subjective:   01/02 0701 - 01/03 0700 In: 5805.1 [P.O.:980; I.V.:3584.3; NG/GT:60; IV Piggyback:1180.8] Out: 2481 [Urine:2480; Stool:1] Lab Results  Component Value Date   CREATININE 3.44 (H) 10/29/2023   CREATININE 3.95 (H) 10/28/2023   CREATININE 4.26 (H) 10/27/2023   Renal function improving slowly.  Creatinine down to 3.4.  Good urine output of 2.4 L over the preceding 24 hours.  Objective:  Vital signs in last 24 hours:  Temp:  [97.6 F (36.4 C)-98.2 F (36.8 C)] 98.2 F (36.8 C) (01/03 1200) Pulse Rate:  [86-103] 93 (01/03 1200) Resp:  [19-28] 22 (01/03 1600) BP: (105-127)/(53-61) 109/57 (01/03 1600) SpO2:  [89 %-95 %] 89 % (01/03 1200) Arterial Line BP: (108-151)/(36-56) 125/49 (01/03 1600) Weight:  [886 kg] 113 kg (01/03 0500)  Weight change: 0.2 kg Filed Weights   10/27/23 0500 10/28/23 0500 10/29/23 0500  Weight: 113.3 kg 112.8 kg 113 kg    Intake/Output: I/O last 3 completed shifts: In: 7928.4 [P.O.:980; I.V.:5521.4; NG/GT:150; IV Piggyback:1277.1] Out: 4331 [Urine:4080; Emesis/NG output:250; Stool:1]   Intake/Output this shift:  Total I/O In: 2101.6 [I.V.:1050.9; IV Piggyback:1050.7] Out: -   Physical Exam: General: No acute distress  Head: Normocephalic, atraumatic. Moist oral mucosal membranes  Neck: Supple  Lungs:  Clear to auscultation, normal effort  Heart: S1S2 no rubs  Abdomen:  Soft, nontender, bowel sounds present  Extremities: No peripheral edema.  Neurologic: Awake, alert, following commands  Skin: No acute rash  Access: No hemodialysis access    Basic Metabolic Panel: Recent Labs  Lab 10/24/23 0700 10/25/23 0205 10/25/23 1319 10/26/23 0419 10/27/23 0347 10/28/23 0510 10/29/23 0412  NA 130*   < > 132* 133* 138 138 142  K 4.4   < > 4.7 4.2 3.8 3.8 3.6  CL 93*   < > 101 100 103 106 111  CO2 13*   < > 16* 21* 23 22 21*  GLUCOSE 166*   < > 189* 210* 151* 246* 177*  BUN 89*   < > 98*  96* 103* 107* 93*  CREATININE 3.81*   < > 3.83* 3.60* 4.26* 3.95* 3.44*  CALCIUM  9.5   < > 7.9* 8.1* 8.2* 8.1* 8.3*  MG  --   --   --  2.0 2.3 2.2 2.0  PHOS 4.2  --   --  3.5 3.2 3.1 3.0   < > = values in this interval not displayed.    Liver Function Tests: Recent Labs  Lab 10/24/23 0700 10/25/23 0205 10/26/23 0419 10/27/23 0347 10/28/23 0510  AST 84* 227* 131* 78* 40  ALT 118* 147* 125* 95* 70*  ALKPHOS 57 43 50 45 46  BILITOT 1.9* 0.8 1.0 0.8 0.8  PROT 7.2 5.3* 5.1* 4.8* 5.0*  ALBUMIN  3.6 2.7* 2.4* 2.2* 2.1*   Recent Labs  Lab 10/26/23 1237  LIPASE 19   No results for input(s): AMMONIA in the last 168 hours.  CBC: Recent Labs  Lab 10/25/23 0205 10/26/23 0419 10/27/23 0347 10/28/23 0510 10/29/23 0412  WBC 6.4 7.2 7.1 8.3 9.5  HGB 11.8* 11.4* 11.7* 11.8* 11.3*  HCT 33.1* 32.8* 34.1* 34.6* 32.6*  MCV 87.1 87.0 90.5 92.0 89.3  PLT 143* 131* 129* 135* 129*    Cardiac Enzymes: No results for input(s): CKTOTAL, CKMB, CKMBINDEX, TROPONINI in the last 168 hours.  BNP: Invalid input(s): POCBNP  CBG: Recent Labs  Lab 10/29/23 0014 10/29/23 0410 10/29/23 0729 10/29/23 1133 10/29/23 1606  GLUCAP 188*  182* 179* 213* 151*    Microbiology: Results for orders placed or performed during the hospital encounter of 10/24/23  Blood culture (routine x 2)     Status: None   Collection Time: 10/24/23  1:14 PM   Specimen: BLOOD  Result Value Ref Range Status   Specimen Description BLOOD BLOOD LEFT HAND  Final   Special Requests   Final    BOTTLES DRAWN AEROBIC AND ANAEROBIC Blood Culture adequate volume   Culture   Final    NO GROWTH 5 DAYS Performed at [facility], [address] [place], [place] [address]    Report Status 10/29/2023 FINAL  Final  Blood culture (routine x 2)     Status: None   Collection Time: 10/24/23  8:20 PM   Specimen: BLOOD LEFT HAND  Result Value Ref Range Status   Specimen Description BLOOD LEFT HAND  Final    Special Requests   Final    BOTTLES DRAWN AEROBIC AND ANAEROBIC Blood Culture results may not be optimal due to an inadequate volume of blood received in culture bottles   Culture   Final    NO GROWTH 5 DAYS Performed at [facility], [address], [place], [place] [address]    Report Status 10/29/2023 FINAL  Final  MRSA Next Gen by PCR, Nasal     Status: None   Collection Time: 10/25/23  2:05 AM   Specimen: Nasal Mucosa; Nasal Swab  Result Value Ref Range Status   MRSA by PCR Next Gen NOT DETECTED NOT DETECTED Final    Comment: (NOTE) The GeneXpert MRSA Assay (FDA approved for NASAL specimens only), is one component of a comprehensive MRSA colonization surveillance program. It is not intended to diagnose MRSA infection nor to guide or monitor treatment for MRSA infections. Test performance is not FDA approved in patients less than 66 years old. Performed at [facility], [address] [place], [place] [address]   SARS Coronavirus 2 by RT PCR (hospital order, performed in [facility] hospital lab) *cepheid single result test* Anterior Nasal Swab     Status: None   Collection Time: 10/25/23  8:37 AM   Specimen: Anterior Nasal Swab  Result Value Ref Range Status   SARS Coronavirus 2 by RT PCR NEGATIVE NEGATIVE Final    Comment: (NOTE) SARS-CoV-2 target nucleic acids are NOT DETECTED.  The SARS-CoV-2 RNA is generally detectable in upper and lower respiratory specimens during the acute phase of infection. The lowest concentration of SARS-CoV-2 viral copies this assay can detect is 250 copies / mL. A negative result does not preclude SARS-CoV-2 infection and should not be used as the sole basis for treatment or other patient management decisions.  A negative result may occur with improper specimen collection / handling, submission of specimen other than nasopharyngeal swab, presence of viral mutation(s) within the areas targeted by this assay, and inadequate  number of viral copies (<250 copies / mL). A negative result must be combined with clinical observations, patient history, and epidemiological information.  Fact Sheet for Patients:   roadlaptop.co.za  Fact Sheet for Healthcare Providers: http://kim-miller.com/  This test is not yet approved or  cleared by the [place]  FDA and has been authorized for detection and/or diagnosis of SARS-CoV-2 by FDA under an Emergency Use Authorization (EUA).  This EUA will remain in effect (meaning this test can be used) for the duration of the COVID-19 declaration under Section 564(b)(1) of the Act, 21 U.S.C. section 360bbb-3(b)(1), unless  the authorization is terminated or revoked sooner.  Performed at [facility], [address] [place], [place] [address]   Respiratory (~20 pathogens) panel by PCR     Status: None   Collection Time: 10/25/23  8:37 AM   Specimen: Nasopharyngeal Swab; Respiratory  Result Value Ref Range Status   Adenovirus NOT DETECTED NOT DETECTED Final   Coronavirus 229E NOT DETECTED NOT DETECTED Final    Comment: (NOTE) The Coronavirus on the Respiratory Panel, DOES NOT test for the novel  Coronavirus 2018-10-14 nCoV)    Coronavirus HKU1 NOT DETECTED NOT DETECTED Final   Coronavirus NL63 NOT DETECTED NOT DETECTED Final   Coronavirus OC43 NOT DETECTED NOT DETECTED Final   Metapneumovirus NOT DETECTED NOT DETECTED Final   Rhinovirus / Enterovirus NOT DETECTED NOT DETECTED Final   Influenza A NOT DETECTED NOT DETECTED Final   Influenza B NOT DETECTED NOT DETECTED Final   Parainfluenza Virus 1 NOT DETECTED NOT DETECTED Final   Parainfluenza Virus 2 NOT DETECTED NOT DETECTED Final   Parainfluenza Virus 3 NOT DETECTED NOT DETECTED Final   Parainfluenza Virus 4 NOT DETECTED NOT DETECTED Final   Respiratory Syncytial Virus NOT DETECTED NOT DETECTED Final   Bordetella pertussis NOT DETECTED NOT DETECTED Final   Bordetella  Parapertussis NOT DETECTED NOT DETECTED Final   Chlamydophila pneumoniae NOT DETECTED NOT DETECTED Final   Mycoplasma pneumoniae NOT DETECTED NOT DETECTED Final    Comment: Performed at [facility], [address] [place], [place] [address]    Coagulation Studies: Recent Labs    10/27/23 10-14-33  LABPROT 15.0  INR 1.2    Urinalysis: No results for input(s): COLORURINE, LABSPEC, PHURINE, GLUCOSEU, HGBUR, BILIRUBINUR, KETONESUR, PROTEINUR, UROBILINOGEN, NITRITE, LEUKOCYTESUR in the last 72 hours.  Invalid input(s): APPERANCEUR    Imaging: DG Abd 1 View Result Date: 10/28/2023 CLINICAL DATA:  Ileus EXAM: ABDOMEN - 1 VIEW COMPARISON:  10/26/2023 FINDINGS: Similar appearance of distended small bowel throughout the central abdomen, loops measuring up to 6.1 cm. Scattered gas present in the colon. No free air on supine radiographs. Right femoral vascular catheter. No acute osseous findings. IMPRESSION: Similar appearance of distended small bowel throughout the central abdomen, loops measuring up to 6.1 cm. Scattered gas present in the colon. Findings are consistent with ileus or small-bowel obstruction. No free air on supine radiographs. Electronically Signed   By: Marolyn JONETTA Jaksch M.D.   On: 10/28/2023 08:19     Medications:    amiodarone  15 mg/hr (10/29/23 1526)   heparin  2,350 Units/hr (10/29/23 1500)   lactated ringers  50 mL/hr at 10/29/23 1500   norepinephrine  (LEVOPHED ) Adult infusion 2 mcg/min (10/29/23 1500)   piperacillin -tazobactam (ZOSYN )  IV Stopped (10/29/23 1243)   [name] (CLINIMIX -E) Adult 42 mL/hr at 10/29/23 1500    capsaicin   1 Application Topical BID   Chlorhexidine  Gluconate Cloth  6 each Topical Daily   feeding supplement  1 Container Oral TID BM   insulin  aspart  0-20 Units Subcutaneous Q4H   [START ON 10/30/2023] levothyroxine   50 mcg Oral Q0600   lidocaine   1 patch Transdermal Q24H   melatonin  5 mg Oral QHS   midodrine   10 mg Oral TID WC    sodium chloride  flush  10-40 mL Intracatheter Q12H   thiamine  (VITAMIN B1) injection  100 mg Intravenous Q24H   dextrose , fentaNYL  (SUBLIMAZE ) injection, mouth rinse, polyethylene glycol, senna-docusate, sodium chloride  flush  Assessment/ Plan:  73 y.o. male  with coronary artery disease status post [name],  hypertension, hyperlipidemia, diabetes mellitus type II noninsulin dependent, who was admitted to [facility] on 10/24/2023 for Ileus [facility] [K56.7] Status post lumbar stenosis surgery on 10/18/23.   Acute Kidney Injury/chronic kidney disease stage IIIa: baseline creatinine of 1.28, GFR of 59.  Suspect acute kidney injury due to ATN most likely.  Entresto , torsemide , metformin , Farxiga  all on hold.   Update: Renal function slowly improving.  Urine output reasonable.  No immediate need for dialysis.  Continue supportive care and avoid nephrotoxins as possible.  Hypotension.  Continue norepinephrine  and midodrine .     LOS: 5 Passion Lavin 1/3/20254:34 PM

## 2023-10-29 NOTE — Progress Notes (Signed)
 NAME:  Jason [name], MRN:  [id number], DOB:  15-Feb-1951, LOS: 5 ADMISSION DATE:  10/24/2023, CONSULTATION DATE:  10/25/23 REFERRING MD: Jawo, Modou NP CHIEF COMPLAINT: Generalized weakness   HPI  73 y.o with significant PMH of lumbar spinal stenosis L3-4, L4-5 laminotomy/foraminotomies, T2DM, PAF, systolic CHF EF 35% s/p ICD placement, CAD status post [name], HTN, HLD who presented to the ED with chief complaints of generalized weakness with associated nausea and vomiting since discharge from the hospital following admission for uncomplicated L3-4, L4-5 laminotomy and foraminotomies on 10/19/2023.   Past Medical History  Lumbar spinal stenosis L3-4, L4-5 laminotomy/foraminotomies, T2DM, PAF, systolic CHF EF 35% s/p ICD placement, CAD status post [name], HTN, HLD  Significant Hospital Events   12/29: Admitted to MedSurg unit with suspected postop ileus NG tube placed for decompression.  Overnight became hypoglycemic requiring dextrose  infusion 12/30: Rapid response called for unresponsiveness, hypoglycemia in the low 20s and hypertension (70/37).  Transferred to ICU PCCM consulted 10/26/23 -patient critically ill, on levophed , has not had nourishment in >1 wk.  Plan for PICC. Blood cultures pending, renal function improved but still severe AKI, repeat KUB today showed OGT got displaced to airway , this was replaced and repeated with repeat imaging showing good placement.  10/26/22- patient more oriented, continues to require levophed .  Sacral wound covered.  Levophed  and D5-1/2NS started.   10/27/22- patient had BM overnight, ilius with 6.1cm dilated loops of bowel on KUB this am with NG to suction still, abdomen distended.  [name] ongoing.  PICC line with [name] ongoing. TTE reassuring, remains on 2 vasopressors.  Discussed with dietary and will try trickle clears today due to clinically less abd tenderness.  10/29/23- patient had BM overnight with resolution of ilius, transitioning to PO liquid diet,  still is on levophed  5 + midodrine  10 TID, reducing IV amio and plan to transition to PO as able.  Consults:  PCCM  Procedures:  12/30 right femoral art line 12/30 right femoral central line  Significant Diagnostic Tests:  12/29: Chest Xray> IMPRESSION: 1. Minor left basilar atelectasis. Otherwise no acute cardiopulmonary findings. 2. Gaseous distension of bowel within the included upper abdomen.  12/30: Noncontrast CT head> IMPRESSION: No acute intracranial abnormality.  12/30: CTA abdomen and pelvis> IMPRESSION: 1. Findings favoring ileus with persistent air and fluid distension of the small bowel, ascending, and transverse. There is no discrete transition point. Enteric tube decompresses the stomach with diminished gastric distension from yesterday. 2. No free air or mesenteric gas. 3. Progressive atelectasis in the medial left greater than right lower lobes. 4. Moderate-sized fat containing umbilical hernia.    Micro Data:  12/29: Blood culture x2> 12/30: MRSA PCR>>   Antimicrobials:  Azithromycin  12/29 Ceftriaxone  12/29  OBJECTIVE  Blood pressure (!) 107/53, pulse 100, temperature 97.9 F (36.6 C), temperature source Oral, resp. rate (!) 23, height 6' 4 (1.93 m), weight 113 kg, SpO2 93%.       Intake/Output Summary (Last 24 hours) at 10/29/2023 0858 Last data filed at 10/29/2023 0800 Gross per 24 hour  Intake 5766.7 ml  Output 2156 ml  Net 3610.7 ml   Filed Weights   10/27/23 0500 10/28/23 0500 10/29/23 0500  Weight: 113.3 kg 112.8 kg 113 kg    Physical Examination  GENERAL: 73 year-old critically ill patient lying in the bed in no acute distress EYES: PEERLA. No scleral icterus. Extraocular muscles intact.  HEENT: Head atraumatic, normocephalic. Oropharynx and nasopharynx clear.  NECK:  No JVD, supple  LUNGS: Normal breath sounds bilaterally.  No use of accessory muscles of respiration.  CARDIOVASCULAR: S1, S2 normal. No murmurs, rubs, or gallops.   ABDOMEN: Soft, NTND EXTREMITIES: trace swelling.  Capillary refill < 3 seconds in all extremities. Pulses palpable distally. NEUROLOGIC: The patient is  oriented to self only. No focal neurological deficit appreciated. Cranial nerves are intact.  SKIN: No obvious rash, lesion, or ulcer. Warm to touch Labs/imaging that I havepersonally reviewed  (right click and Reselect all SmartList Selections daily)     Labs   CBC: Recent Labs  Lab 10/25/23 0205 10/26/23 0419 10/27/23 0347 10/28/23 0510 10/29/23 0412  WBC 6.4 7.2 7.1 8.3 9.5  HGB 11.8* 11.4* 11.7* 11.8* 11.3*  HCT 33.1* 32.8* 34.1* 34.6* 32.6*  MCV 87.1 87.0 90.5 92.0 89.3  PLT 143* 131* 129* 135* 129*    Basic Metabolic Panel: Recent Labs  Lab 10/24/23 0700 10/25/23 0205 10/25/23 1319 10/26/23 0419 10/27/23 0347 10/28/23 0510 10/29/23 0412  NA 130*   < > 132* 133* 138 138 142  K 4.4   < > 4.7 4.2 3.8 3.8 3.6  CL 93*   < > 101 100 103 106 111  CO2 13*   < > 16* 21* 23 22 21*  GLUCOSE 166*   < > 189* 210* 151* 246* 177*  BUN 89*   < > 98* 96* 103* 107* 93*  CREATININE 3.81*   < > 3.83* 3.60* 4.26* 3.95* 3.44*  CALCIUM  9.5   < > 7.9* 8.1* 8.2* 8.1* 8.3*  MG  --   --   --  2.0 2.3 2.2 2.0  PHOS 4.2  --   --  3.5 3.2 3.1 3.0   < > = values in this interval not displayed.   GFR: Estimated Creatinine Clearance: 26.3 mL/min (A) (by C-G formula based on SCr of 3.44 mg/dL (H)). Recent Labs  Lab 10/24/23 1314 10/24/23 1620 10/25/23 0205 10/25/23 0821 10/26/23 0419 10/26/23 1154 10/27/23 0347 10/27/23 1340 10/28/23 0510 10/29/23 0412  PROCALCITON 11.21  --  35.61  --  51.30  --   --   --   --   --   WBC  --   --  6.4  --  7.2  --  7.1  --  8.3 9.5  LATICACIDVEN 8.4*   < > 4.2* 3.1*  --  1.4  --  0.9  --   --    < > = values in this interval not displayed.    Liver Function Tests: Recent Labs  Lab 10/24/23 0700 10/25/23 0205 10/26/23 0419 10/27/23 0347 10/28/23 0510  AST 84* 227* 131* 78* 40  ALT  118* 147* 125* 95* 70*  ALKPHOS 57 43 50 45 46  BILITOT 1.9* 0.8 1.0 0.8 0.8  PROT 7.2 5.3* 5.1* 4.8* 5.0*  ALBUMIN  3.6 2.7* 2.4* 2.2* 2.1*   Recent Labs  Lab 10/26/23 1237  LIPASE 19   No results for input(s): AMMONIA in the last 168 hours.  ABG    Component Value Date/Time   PHART 7.35 10/25/2023 0317   PCO2ART 27 (L) 10/25/2023 0317   PO2ART 75 (L) 10/25/2023 0317   HCO3 14.9 (L) 10/25/2023 0317   TCO2 26 04/15/2016 1707   ACIDBASEDEF 9.1 (H) 10/25/2023 0317   O2SAT 97.1 10/25/2023 0317     Coagulation Profile: Recent Labs  Lab 10/27/23 2134  INR 1.2    Cardiac Enzymes: No results for input(s): CKTOTAL, CKMB, CKMBINDEX, TROPONINI in  the last 168 hours.  HbA1C: Hgb A1c MFr Bld  Date/Time Value Ref Range Status  10/13/2023 12:00 PM 5.8 (H) 4.8 - 5.6 % Final    Comment:    (NOTE) Pre diabetes:          5.7%-6.4%  Diabetes:              >6.4%  Glycemic control for   <7.0% adults with diabetes   04/13/2016 06:37 PM 7.6 (H) 4.8 - 5.6 % Final    Comment:    (NOTE)         Pre-diabetes: 5.7 - 6.4         Diabetes: >6.4         Glycemic control for adults with diabetes: <7.0     CBG: Recent Labs  Lab 10/28/23 1633 10/28/23 1941 10/29/23 0014 10/29/23 0410 10/29/23 0729  GLUCAP 175* 193* 188* 182* 179*    Review of Systems:   Unable to be obtained secondary to the patient's intubated altered mental status.   Past Medical History  He,  has a past medical history of AICD (automatic cardioverter/defibrillator) present, Arthritis, CHF (congestive heart failure) (HCC), Coronary artery disease involving native coronary artery with unstable angina pectoris (HCC) (04/13/2016), Coronary artery disease with history of myocardial infarction without history of [name], Cough, Diabetes mellitus without complication (HCC), Edema, Essential hypertension, Hypertension, Hypothyroidism, Left main coronary artery disease (04/13/2016), Mixed hyperlipidemia,  Obesity, PAF (paroxysmal atrial fibrillation) (HCC), S/P [name] (04/14/2016), Type II diabetes mellitus (HCC), and Unstable angina (HCC) (04/13/2016).   Surgical History    Past Surgical History:  Procedure Laterality Date   BACK SURGERY     Lumbar Surgeries. [place]. Dr. Drury x2   CARDIAC CATHETERIZATION Left 04/13/2016   Procedure: Left Heart Cath and Coronary Angiography;  Surgeon: Wolm JINNY Rhyme, MD;  Location: [facility] INVASIVE CV [facility];  Service: Cardiovascular;  Laterality: Left;   CARDIOVERSION N/A 08/02/2018   Procedure: CARDIOVERSION;  Surgeon: Rhyme Wolm JINNY, MD;  Location: [facility] ORS;  Service: Cardiovascular;  Laterality: N/A;   CATARACT EXTRACTION W/PHACO Right 01/23/2016   Procedure: CATARACT EXTRACTION PHACO AND INTRAOCULAR LENS PLACEMENT (IOC);  Surgeon: Elsie Carmine, MD;  Location: [facility] ORS;  Service: Ophthalmology;  Laterality: Right;  US  01:18 AP% 23.8 CDE 18.67 fluid pack lot # [id number]   CHOLECYSTECTOMY     CORONARY ARTERY BYPASS GRAFT N/A 04/14/2016   Procedure: CORONARY ARTERY BYPASS GRAFTING TIMES FOUR    USING LEFT INTERNAL MAMMARY AND ENDOSCOPIC HARVEST RIGHT SAPHENOUS VEIN;  Surgeon: Sudie VEAR Laine, MD;  Location: [facility] OR;  Service: Open Heart Surgery;  Laterality: N/A;   EYE SURGERY Bilateral    Cataract Extraction with IOL implant.   INTRAOPERATIVE TRANSESOPHAGEAL ECHOCARDIOGRAM N/A 04/14/2016   Procedure: INTRAOPERATIVE TRANSESOPHAGEAL ECHOCARDIOGRAM;  Surgeon: Sudie VEAR Laine, MD;  Location[facility];  Service: Open Heart Surgery;  Laterality: N/A;   KNEE ARTHROSCOPY Left 10/08/2015   Procedure: ARTHROSCOPY KNEE, PARTIAL MEDIAL AND LATERAL MENISECTOMY, REMOVAL OF FOREIGN BODY;  Surgeon: Ozell Flake, MD;  Location: [facility] ORS;  Service: Orthopedics;  Laterality: Left;   LUMBAR LAMINECTOMY/DECOMPRESSION MICRODISCECTOMY Bilateral 10/18/2023   Procedure: Lumbar Three-Four, Lumbar Four-Five  Laminotomy, Foraminotomy;  Surgeon: Mavis Purchase, MD;   Location: [facility] OR;  Service: Neurosurgery;  Laterality: Bilateral;  3C     Social History   reports that he has never smoked. His smokeless tobacco use includes chew. He reports that he does not drink alcohol and does not use drugs.  Family History   His family history includes Aortic stenosis in his father; Heart disease in his father; Hypertension in his father.   Allergies Allergies  Allergen Reactions   Aspirin Anaphylaxis   Inderal [Propranolol]     Other reaction(s): Other (See Comments) Fatigue   Metformin  Hcl Diarrhea    Pt still takes medication   Mevacor [Lovastatin] Other (See Comments)    muscle Pain   Penicillin G Hives   Pravachol [Pravastatin Sodium] Other (See Comments)    Muscle Pain   Zocor [Simvastatin] Other (See Comments)    Muscle Pain   Penicillins Rash    Has patient had a PCN reaction causing immediate rash, facial/tongue/throat swelling, SOB or lightheadedness with hypotension: Yes Has patient had a PCN reaction causing severe rash involving mucus membranes or skin necrosis: Yes Has patient had a PCN reaction that required hospitalization No Has patient had a PCN reaction occurring within the last 10 years: Yes If all of the above answers are NO, then may proceed with Cephalosporin use.      Home Medications  Prior to Admission medications   Medication Sig Start Date End Date Taking? Authorizing Provider  acetaminophen  (TYLENOL ) 500 MG tablet Take 500-1,000 mg by mouth every 6 (six) hours as needed (pain.).    [provider]  amiodarone  (PACERONE ) 200 MG tablet Take 100 mg by mouth in the morning.    [provider]  apixaban  (ELIQUIS ) 5 MG TABS tablet Take 1 tablet (5 mg total) by mouth 2 (two) times daily. 10/25/23   Johnanna Credit Caylin, PA-C  cyclobenzaprine  (FLEXERIL ) 10 MG tablet Take 1 tablet (10 mg total) by mouth 3 (three) times daily as needed for muscle spasms. 10/19/23   Tomlinson, Sara Caylin, PA-C  dapagliflozin   propanediol (FARXIGA ) 10 MG TABS tablet Take 10 mg by mouth in the morning.    [provider]  docusate sodium  (COLACE) 100 MG capsule Take 1 capsule (100 mg total) by mouth 2 (two) times daily. 10/19/23 10/18/24  Johnanna Credit Caylin, PA-C  gabapentin  (NEURONTIN ) 100 MG capsule Take 100 mg by mouth at bedtime as needed (nerve pain.). 06/30/23   [provider]  glimepiride  (AMARYL ) 4 MG tablet Take 4 mg by mouth 2 (two) times daily. Reported on 04/13/2016    [provider]  levothyroxine  (SYNTHROID ) 50 MCG tablet Take 50 mcg by mouth daily at 2 am. 08/08/23   [provider]  metFORMIN  (GLUCOPHAGE ) 1000 MG tablet Take 1,000 mg by mouth in the morning and at bedtime.    [provider]  metoprolol  succinate (TOPROL -XL) 100 MG 24 hr tablet Take 100 mg by mouth in the morning. Take with or immediately following a meal.    [provider]  Oxycodone  HCl 10 MG TABS Take 1 tablet (10 mg total) by mouth every 4 (four) hours as needed for severe pain (pain score 7-10). 10/19/23   Tomlinson, Sara Caylin, PA-C  rosuvastatin  (CRESTOR ) 40 MG tablet Take 20 mg by mouth at bedtime. 07/01/23   [provider]  sacubitril -valsartan  (ENTRESTO ) 49-51 MG Take 1 tablet by mouth 2 (two) times daily.    [provider]  torsemide  (DEMADEX ) 20 MG tablet Take 20 mg by mouth in the morning. 11/23/22   [provider]  Scheduled Meds:  capsaicin   1 Application Topical BID   Chlorhexidine  Gluconate Cloth  6 each Topical Daily   insulin  aspart  0-20 Units Subcutaneous Q4H   [START ON 10/31/2023] levothyroxine   37.5  mcg Intravenous Daily   lidocaine   1 patch Transdermal Q24H   melatonin  5 mg Oral QHS   midodrine   10 mg Oral TID WC   sodium chloride  flush  10-40 mL Intracatheter Q12H   thiamine  (VITAMIN B1) injection  100 mg Intravenous Q24H   Continuous Infusions:  amiodarone  30 mg/hr (10/29/23 0800)   heparin  2,350 Units/hr (10/29/23 0800)    lactated ringers  50 mL/hr at 10/29/23 0800   norepinephrine  (LEVOPHED ) Adult infusion 6 mcg/min (10/29/23 0800)   piperacillin -tazobactam (ZOSYN )  IV 3.375 g (10/29/23 0843)   [name] (CLINIMIX -E) Adult 42 mL/hr at 10/29/23 0800   vasopressin  Stopped (10/28/23 1411)   PRN Meds:.acetaminophen  **OR** [DISCONTINUED] acetaminophen , dextrose , fentaNYL  (SUBLIMAZE ) injection, ondansetron  **OR** ondansetron  (ZOFRAN ) IV, mouth rinse, polyethylene glycol, senna-docusate, sodium chloride  flush   Active Hospital Problem list   See systems below  Assessment & Plan:   #Ileus Suspect post op related due to resent spinal surgery Repeat CT abd/pelvis ileus with persistent air and fluid distension of the small bowel, ascending, and transverse. -Keep Npo -Gentle IVFs -NGT for decompression -Surgery consult- Discussed case with Dr Desiderio unofficially does not appear to need surgical intervention at this moment.   -Hold narcotics -s/p [name] will try clear PO diet low dose   #Sepic shock-  due to Suspected Intra-abdominal source-PRESENT ON ADMISSION -sacral wounds present on admission  -levophed  and vasopressin   -F/u cultures, trend lactic/ PCT -Monitor WBC/ fever curve -IV antibiotics: ceftriaxone  and Azithromycin >>ZOSYN  Iv -IVF hydration as needed -Pressors for MAP goal >65 -Strict I/O's -having CT lumbar spine today due to back pain post operatively  #AKI Likely ATN~improved  #Hyponatremia #NAGMA with Lactic Acidosis -Trend Lactate -Avoid nephrotoxic agents  -Follow BMP -Replace electrolytes as indicated -Nephrology consult   #Elevated LFTs -shock liver in the setting of severe sepsis with hypotension -Trend cmp  #Mildly Elevated Troponin, suspect demand ischemia #HFrEF  EF 35% s/p ICD placement  #Hypertension PMHx: CAD s/p [name] X4, PAF, HLD BNP mildly elevated without overt signs of volume overload or pulmonary edema -Trend HS Troponin until peaked -Hold torsemide , Entresto ,  metoprolol , Farxiga  and amiodarone   -Continue rosuvastatin  once able to tolerate p.o. -Hold Eliquis  for now -Obtain 2D echo  #T2 Diabetes mellitus  -Check hemoglobin A1c -CBGs -Sliding scale insulin  -Follow ICU hyper/hypoglycemia protocol -Hold metformin  and glimepiride    #Chronic Pain #Hx of Chronic Bilateral Low Back Pain  #hx of severe lumbar stenosis from L3-L5 with right lower extremity radiculopathy s/p  L3-4, L4-5 laminotomy and foraminotomies on 10/19/2023  -Follows with Neurosugeon  -on Gabapentin  and Flexeril , and nerve block PRN  #Hypothyroidism -Check TSH and free T4 -Continue home Synthroid   Best practice:  Diet:  NPO Pain/Anxiety/Delirium protocol (if indicated): No VAP protocol (if indicated): Not indicated DVT prophylaxis: Subcutaneous Heparin  GI prophylaxis: PPI Glucose control:  SSI No Central venous access:  Yes, and it is still needed Arterial line:  Yes, and it is still needed Foley:  N/A Mobility:  bed rest  PT consulted: N/A Last date of multidisciplinary goals of care discussion [updated wife over the phone] Code Status:  full code Disposition: ICU   = Goals of Care = Code Status Order: FULL  Primary Emergency Contact: Morimoto,Katie V, Home Phone: [phone] Wishes to pursue full aggressive treatment and intervention options, including CPR and intubation, but goals of care will be addressed on going with family if that should become necessary.  Critical care provider statement:   Total critical care time: 33 minutes  Performed by: Parris MD   Critical care time was exclusive of separately billable procedures and treating other patients.   Critical care was necessary to treat or prevent imminent or life-threatening deterioration.   Critical care was time spent personally by me on the following activities: development of treatment plan with patient and/or surrogate as well as nursing, discussions with consultants, evaluation of  patient's response to treatment, examination of patient, obtaining history from patient or surrogate, ordering and performing treatments and interventions, ordering and review of laboratory studies, ordering and review of radiographic studies, pulse oximetry and re-evaluation of patient's condition.    Debara Kamphuis, M.D.  Pulmonary & Critical Care Medicine

## 2023-10-29 NOTE — Progress Notes (Signed)
 ANTICOAGULATION CONSULT NOTE  Pharmacy Consult for Heparin  Infusion Indication: atrial fibrillation  Patient Measurements: Height: 6' 4 (193 cm) Weight: 113 kg (249 lb 1.9 oz) IBW/kg (Calculated) : 86.8 Heparin  Dosing Weight: 109.9 kg  Labs: Recent Labs    10/27/23 0347 10/27/23 2134 10/28/23 0510 10/28/23 1451 10/29/23 0015 10/29/23 0412 10/29/23 0853 10/29/23 1727  HGB 11.7*  --  11.8*  --   --  11.3*  --   --   HCT 34.1*  --  34.6*  --   --  32.6*  --   --   PLT 129*  --  135*  --   --  129*  --   --   APTT  --  29  --   --   --   --   --   --   LABPROT  --  15.0  --   --   --   --   --   --   INR  --  1.2  --   --   --   --   --   --   HEPARINUNFRC  --   --  <0.10*   < > 0.25*  --  0.32 0.28*  CREATININE 4.26*  --  3.95*  --   --  3.44*  --   --    < > = values in this interval not displayed.    Estimated Creatinine Clearance: 26.3 mL/min (A) (by C-G formula based on SCr of 3.44 mg/dL (H)).   Medical History: Past Medical History:  Diagnosis Date   AICD (automatic cardioverter/defibrillator) present    [employer]   Arthritis    CHF (congestive heart failure) (HCC)    Coronary artery disease involving native coronary artery with unstable angina pectoris (HCC) 04/13/2016   Coronary artery disease with history of myocardial infarction without history of [name]    Dr. Quin   Cough    CHRONIC   Diabetes mellitus without complication (HCC)    Edema    FEET/LEGS   Essential hypertension    Hypertension    Hypothyroidism    Left main coronary artery disease 04/13/2016   Mixed hyperlipidemia    Obesity    PAF (paroxysmal atrial fibrillation) (HCC)    S/P [name] 04/14/2016   LIMA to LAD, SVG to D1, SVG to OM, SVG to PDA, EVH via right thigh and leg   Type II diabetes mellitus (HCC)    Unstable angina (HCC) 04/13/2016   Assessment: Pt is a 73 yo male admitted on 10/25/23 with ileus, with h/o A fib on Eliquis  5 mg BID.  Eliquis  not resumed since admission, pt  ordered heparin  5000 units sub-Q q8hr starting 12/29, last dose 10/26/22 @ 1327.  Goal of Therapy:  Heparin  level 0.3-0.7 units/ml Monitor platelets by anticoagulation protocol: Yes  1/3@0015  HL = 0.25 Subtherapeutic  1/3@0853  HL = 0.32 Therapeutic x 1 1/3@1727  HL = 0.28 Subtherapeutic    Plan:  Heparin  bolus 1,650 x 1  Increase heparin  infusion to 2550 units/hr from 2350 units/hr Re-check HL in 8 hours after rate increase Daily CBC per protocol while on IV heparin   Malkie Wille K Aprille Sawhney, PharmD Pharmacy Resident  10/29/2023 5:51 PM

## 2023-10-29 NOTE — Progress Notes (Signed)
 Inpatient Rehab Admissions Coordinator:    CIR following. Not medically stable at this time.   Megan Salon, MS, CCC-SLP Rehab Admissions Coordinator  [phone] (celll) [phone] (office)

## 2023-10-29 NOTE — Progress Notes (Signed)
 ANTICOAGULATION CONSULT NOTE  Pharmacy Consult for Heparin  Infusion Indication: atrial fibrillation  Patient Measurements: Height: 6' 4 (193 cm) Weight: 113 kg (249 lb 1.9 oz) IBW/kg (Calculated) : 86.8 Heparin  Dosing Weight: 109.9 kg  Labs: Recent Labs    10/27/23 0347 10/27/23 0347 10/27/23 2134 10/28/23 0510 10/28/23 1451 10/29/23 0015 10/29/23 0412 10/29/23 0853  HGB 11.7*  --   --  11.8*  --   --  11.3*  --   HCT 34.1*  --   --  34.6*  --   --  32.6*  --   PLT 129*  --   --  135*  --   --  129*  --   APTT  --   --  29  --   --   --   --   --   LABPROT  --   --  15.0  --   --   --   --   --   INR  --   --  1.2  --   --   --   --   --   HEPARINUNFRC  --    < >  --  <0.10* 0.20* 0.25*  --  0.32  CREATININE 4.26*  --   --  3.95*  --   --  3.44*  --    < > = values in this interval not displayed.    Estimated Creatinine Clearance: 26.3 mL/min (A) (by C-G formula based on SCr of 3.44 mg/dL (H)).   Medical History: Past Medical History:  Diagnosis Date   AICD (automatic cardioverter/defibrillator) present    [employer]   Arthritis    CHF (congestive heart failure) (HCC)    Coronary artery disease involving native coronary artery with unstable angina pectoris (HCC) 04/13/2016   Coronary artery disease with history of myocardial infarction without history of [name]    Dr. Quin   Cough    CHRONIC   Diabetes mellitus without complication (HCC)    Edema    FEET/LEGS   Essential hypertension    Hypertension    Hypothyroidism    Left main coronary artery disease 04/13/2016   Mixed hyperlipidemia    Obesity    PAF (paroxysmal atrial fibrillation) (HCC)    S/P [name] 04/14/2016   LIMA to LAD, SVG to D1, SVG to OM, SVG to PDA, EVH via right thigh and leg   Type II diabetes mellitus (HCC)    Unstable angina (HCC) 04/13/2016   Assessment: Pt is a 73 yo male admitted on 10/25/23 with ileus, with h/o A fib on Eliquis  5 mg BID.  Eliquis  not resumes since admission, pt  ordered heparin  5000 units sub-Q q8hr starting 12/29, last dose 10/26/22 @ 1327.  Goal of Therapy:  Heparin  level 0.3-0.7 units/ml Monitor platelets by anticoagulation protocol: Yes   Plan:  --1/3 at 0853 HL = 0.32, therapeutic x 1. Continue heparin  infusion at 2350 units/hr --Re-check confirmatory HL in 8 hours --Daily CBC per protocol while on IV heparin   Marolyn KATHEE Mare 10/29/2023 12:56 PM

## 2023-10-29 NOTE — Progress Notes (Signed)
 Physical Therapy Treatment Patient Details Name: Jason [name] MRN: [id number] DOB: [birth year] Today's Date: 10/29/2023   History of Present Illness Jason [name] is a 73 year old male with history of CAD status post four-vessel [name] in 2017, hypertension, hyperlipidemia, non-insulin -dependent diabetes mellitus, history of lumbar stenosis status post spinal stenosis surgery on 10/18/2023, who presents emergency department for chief concerns of generalized weakness since being discharged post spinal surgery, and intractable nausea and vomiting. Patient found to have ileus.    PT Comments  Patient received in bed, he is asking if it is hot in the room. Face is flushed. Reports feeling more abdominal distention. He has a lot of pain in right knee with minimal movement ( PROM and rolling in bed). Yelling out. Patient was found to be soiled in bed, therefore worked on rolling to get cleaned up. Patient unable to tolerate further mobility at this time. He will continue to benefit from skilled PT to improve functional independence, strength, rom and endurance. Patient is not able to tolerate >3 hours of therapy at this time, recommending < 3 hours based on current status. Will continue to monitor.       If plan is discharge home, recommend the following: Two people to help with walking and/or transfers;A lot of help with bathing/dressing/bathroom   Can travel by private vehicle     No  Equipment Recommendations  None recommended by PT;Other (comment) (TBD)    Recommendations for Other Services       Precautions / Restrictions Precautions Precautions: Back Precaution Comments: recent lumbar surgery, no brace needed Restrictions Weight Bearing Restrictions Per Provider Order: No     Mobility  Bed Mobility Overal bed mobility: Needs Assistance Bed Mobility: Rolling Rolling: Mod assist, Max assist, +2 for physical assistance, +2 for safety/equipment, Used rails         General bed  mobility comments: Patient is very limited by Right knee pain even rolling in bed. Requires +2 for safety and mobility, cues for safety, line management.    Transfers                   General transfer comment: not attempted    Ambulation/Gait               General Gait Details: unable   Stairs             Wheelchair Mobility     Tilt Bed    Modified Rankin (Stroke Patients Only)       Balance Overall balance assessment: Needs assistance                                          Cognition Arousal: Alert Behavior During Therapy: WFL for tasks assessed/performed Overall Cognitive Status: Within Functional Limits for tasks assessed                                          Exercises Other Exercises Other Exercises: Instructed patient in heel slides in bed to improve knee rom and decrease pain. Patient with limited ability to perform independently. PROM to B knees due to reported pain. R more painful than left.    General Comments        Pertinent Vitals/Pain Pain Assessment Pain Assessment: Faces Faces Pain Scale: Hurts  even more Pain Location: B knees and back Pain Descriptors / Indicators: Discomfort, Grimacing, Guarding, Moaning Pain Intervention(s): Monitored during session, Repositioned    Home Living                          Prior Function            PT Goals (current goals can now be found in the care plan section) Acute Rehab PT Goals Patient Stated Goal: to return home PT Goal Formulation: With patient Time For Goal Achievement: 11/11/23 Potential to Achieve Goals: Fair Progress towards PT goals: Not progressing toward goals - comment (patient having a rough day per RN. Continues to be on pressors, feeling a lot more abdominal distension today. Flushed. Had repeat CT today- results not in yet at time of session.)    Frequency    Min 1X/week      PT Plan       Co-evaluation PT/OT/SLP Co-Evaluation/Treatment: Yes Reason for Co-Treatment: For patient/therapist safety;To address functional/ADL transfers PT goals addressed during session: Mobility/safety with mobility        AM-PAC PT 6 Clicks Mobility   Outcome Measure  Help needed turning from your back to your side while in a flat bed without using bedrails?: A Lot Help needed moving from lying on your back to sitting on the side of a flat bed without using bedrails?: A Lot Help needed moving to and from a bed to a chair (including a wheelchair)?: Total Help needed standing up from a chair using your arms (e.g., wheelchair or bedside chair)?: Total Help needed to walk in hospital room?: Total Help needed climbing 3-5 steps with a railing? : Total 6 Click Score: 8    End of Session Equipment Utilized During Treatment: Oxygen Activity Tolerance: Patient limited by pain;Patient limited by fatigue Patient left: in bed;with call bell/phone within reach Nurse Communication: Mobility status PT Visit Diagnosis: Other abnormalities of gait and mobility (R26.89);Muscle weakness (generalized) (M62.81);Pain Pain - Right/Left: Right Pain - part of body: Knee     Time: 8642-8578 PT Time Calculation (min) (ACUTE ONLY): 24 min  Charges:    $Therapeutic Activity: 8-22 mins PT General Charges $$ ACUTE PT VISIT: 1 Visit                     Joni Colegrove, PT, GCS 10/29/23,3:20 PM

## 2023-10-30 LAB — HEPATIC FUNCTION PANEL
ALT: 36 U/L (ref 0–44)
AST: 23 U/L (ref 15–41)
Albumin: 2.2 g/dL — ABNORMAL LOW (ref 3.5–5.0)
Alkaline Phosphatase: 38 U/L (ref 38–126)
Bilirubin, Direct: <0.1 mg/dL (ref 0.0–0.2)
Total Bilirubin: 0.5 mg/dL (ref 0.0–1.2)
Total Protein: 5 g/dL — ABNORMAL LOW (ref 6.5–8.1)

## 2023-10-30 LAB — GLUCOSE, CAPILLARY
Glucose-Capillary: 143 mg/dL — ABNORMAL HIGH (ref 70–99)
Glucose-Capillary: 117 mg/dL — ABNORMAL HIGH (ref 70–99)
Glucose-Capillary: 156 mg/dL — ABNORMAL HIGH (ref 70–99)
Glucose-Capillary: 194 mg/dL — ABNORMAL HIGH (ref 70–99)
Glucose-Capillary: 221 mg/dL — ABNORMAL HIGH (ref 70–99)

## 2023-10-30 LAB — CBC
HCT: 33.6 % — ABNORMAL LOW (ref 39.0–52.0)
Hemoglobin: 11.2 g/dL — ABNORMAL LOW (ref 13.0–17.0)
MCH: 30.4 pg (ref 26.0–34.0)
MCHC: 33.3 g/dL (ref 30.0–36.0)
MCV: 91.1 fL (ref 80.0–100.0)
Platelets: 105 10*3/uL — ABNORMAL LOW (ref 150–400)
RBC: 3.69 MIL/uL — ABNORMAL LOW (ref 4.22–5.81)
RDW: 13.3 % (ref 11.5–15.5)
WBC: 9.6 10*3/uL (ref 4.0–10.5)
nRBC: 0 % (ref 0.0–0.2)

## 2023-10-30 LAB — BASIC METABOLIC PANEL
Anion gap: 10 (ref 5–15)
BUN: 80 mg/dL — ABNORMAL HIGH (ref 8–23)
CO2: 21 mmol/L — ABNORMAL LOW (ref 22–32)
Calcium: 8.3 mg/dL — ABNORMAL LOW (ref 8.9–10.3)
Chloride: 109 mmol/L (ref 98–111)
Creatinine, Ser: 2.79 mg/dL — ABNORMAL HIGH (ref 0.61–1.24)
GFR, Estimated: 23 mL/min — ABNORMAL LOW (ref >60)
Glucose, Bld: 140 mg/dL — ABNORMAL HIGH (ref 70–99)
Potassium: 3.3 mmol/L — ABNORMAL LOW (ref 3.5–5.1)
Sodium: 140 mmol/L (ref 135–145)

## 2023-10-30 LAB — HEPARIN LEVEL (UNFRACTIONATED)
Heparin Unfractionated: 0.48 [IU]/mL (ref 0.30–0.70)
Heparin Unfractionated: 0.42 [IU]/mL (ref 0.30–0.70)

## 2023-10-30 MED ORDER — AMIODARONE HCL 200 MG PO TABS
100.0000 mg | ORAL_TABLET | Freq: Every day | ORAL | Status: DC
  Administered 2023-10-30 – 2023-11-04 (×6): 100 mg via ORAL
  Filled 2023-10-30 (×6): qty 1

## 2023-10-30 MED ORDER — INSULIN ASPART 100 UNIT/ML IJ SOLN
0.0000 [IU] | Freq: Every day | INTRAMUSCULAR | Status: DC
  Administered 2023-10-30: 2 [IU] via SUBCUTANEOUS
  Filled 2023-10-30: qty 1

## 2023-10-30 MED ORDER — ACETAMINOPHEN 325 MG PO TABS
650.0000 mg | ORAL_TABLET | Freq: Four times a day (QID) | ORAL | Status: DC | PRN
  Administered 2023-10-30 – 2023-11-04 (×10): 650 mg via ORAL
  Filled 2023-10-30 (×10): qty 2

## 2023-10-30 MED ORDER — INSULIN ASPART 100 UNIT/ML IJ SOLN
0.0000 [IU] | Freq: Three times a day (TID) | INTRAMUSCULAR | Status: DC
  Administered 2023-10-30 – 2023-10-31 (×2): 4 [IU] via SUBCUTANEOUS
  Administered 2023-10-31: 3 [IU] via SUBCUTANEOUS
  Administered 2023-10-31 – 2023-11-01 (×3): 4 [IU] via SUBCUTANEOUS
  Administered 2023-11-01: 7 [IU] via SUBCUTANEOUS
  Administered 2023-11-02: 3 [IU] via SUBCUTANEOUS
  Administered 2023-11-02: 4 [IU] via SUBCUTANEOUS
  Administered 2023-11-02: 7 [IU] via SUBCUTANEOUS
  Administered 2023-11-03 – 2023-11-04 (×4): 4 [IU] via SUBCUTANEOUS
  Administered 2023-11-04 (×2): 7 [IU] via SUBCUTANEOUS
  Filled 2023-10-30 (×16): qty 1

## 2023-10-30 MED ORDER — POTASSIUM CHLORIDE CRYS ER 20 MEQ PO TBCR
40.0000 meq | EXTENDED_RELEASE_TABLET | Freq: Once | ORAL | Status: AC
Start: 2023-10-30 — End: 2023-10-30
  Administered 2023-10-30: 40 meq via ORAL
  Filled 2023-10-30: qty 2

## 2023-10-30 NOTE — Progress Notes (Signed)
 [facility]  PROGRESS NOTE   Subjective:   Patient seen at bedside.  Vital signs stable.  Urine output over 4 L.  Objective:  Vital signs: Blood pressure (!) 113/54, pulse 95, temperature 97.9 F (36.6 C), temperature source Oral, resp. rate 16, height 6' 4 (1.93 m), weight 113.2 kg, SpO2 93%.  Intake/Output Summary (Last 24 hours) at 10/30/2023 1206 Last data filed at 10/30/2023 1155 Gross per 24 hour  Intake 3046.3 ml  Output 4265 ml  Net -1218.7 ml   Filed Weights   10/28/23 0500 10/29/23 0500 10/30/23 0400  Weight: 112.8 kg 113 kg 113.2 kg     Physical Exam: General:  No acute distress  Head:  Normocephalic, atraumatic. Moist oral mucosal membranes  Eyes:  Anicteric  Neck:  Supple  Lungs:   Clear to auscultation, normal effort  Heart:  S1S2 no rubs  Abdomen:   Soft, nontender, bowel sounds present  Extremities:  peripheral edema.  Neurologic:  Awake, alert, following commands  Skin:  No lesions  Access:     Basic Metabolic Panel: Recent Labs  Lab 10/24/23 0700 10/25/23 0205 10/26/23 0419 10/27/23 0347 10/28/23 0510 10/29/23 0412 10/30/23 0407  NA 130*   < > 133* 138 138 142 140  K 4.4   < > 4.2 3.8 3.8 3.6 3.3*  CL 93*   < > 100 103 106 111 109  CO2 13*   < > 21* 23 22 21* 21*  GLUCOSE 166*   < > 210* 151* 246* 177* 140*  BUN 89*   < > 96* 103* 107* 93* 80*  CREATININE 3.81*   < > 3.60* 4.26* 3.95* 3.44* 2.79*  CALCIUM  9.5   < > 8.1* 8.2* 8.1* 8.3* 8.3*  MG  --   --  2.0 2.3 2.2 2.0  --   PHOS 4.2  --  3.5 3.2 3.1 3.0  --    < > = values in this interval not displayed.   GFR: Estimated Creatinine Clearance: 32.5 mL/min (A) (by C-G formula based on SCr of 2.79 mg/dL (H)).  Liver Function Tests: Recent Labs  Lab 10/24/23 0700 10/25/23 0205 10/26/23 0419 10/27/23 0347 10/28/23 0510  AST 84* 227* 131* 78* 40  ALT 118* 147* 125* 95* 70*  ALKPHOS 57 43 50 45 46  BILITOT 1.9* 0.8 1.0 0.8 0.8  PROT 7.2 5.3* 5.1* 4.8* 5.0*  ALBUMIN   3.6 2.7* 2.4* 2.2* 2.1*   Recent Labs  Lab 10/26/23 1237  LIPASE 19   No results for input(s): AMMONIA in the last 168 hours.  CBC: Recent Labs  Lab 10/26/23 0419 10/27/23 0347 10/28/23 0510 10/29/23 0412 10/30/23 0407  WBC 7.2 7.1 8.3 9.5 9.6  HGB 11.4* 11.7* 11.8* 11.3* 11.2*  HCT 32.8* 34.1* 34.6* 32.6* 33.6*  MCV 87.0 90.5 92.0 89.3 91.1  PLT 131* 129* 135* 129* 105*     HbA1C: Hgb A1c MFr Bld  Date/Time Value Ref Range Status  10/13/2023 12:00 PM 5.8 (H) 4.8 - 5.6 % Final    Comment:    (NOTE) Pre diabetes:          5.7%-6.4%  Diabetes:              >6.4%  Glycemic control for   <7.0% adults with diabetes   04/13/2016 06:37 PM 7.6 (H) 4.8 - 5.6 % Final    Comment:    (NOTE)         Pre-diabetes: 5.7 - 6.4  Diabetes: >6.4         Glycemic control for adults with diabetes: <7.0     Urinalysis: No results for input(s): COLORURINE, LABSPEC, PHURINE, GLUCOSEU, HGBUR, BILIRUBINUR, KETONESUR, PROTEINUR, UROBILINOGEN, NITRITE, LEUKOCYTESUR in the last 72 hours.  Invalid input(s): APPERANCEUR    Imaging: CT LUMBAR SPINE WO CONTRAST Result Date: 10/29/2023 CLINICAL DATA:  Generalized weakness with nausea and vomiting since recent back surgery 10/19/2023. EXAM: CT LUMBAR SPINE WITHOUT CONTRAST TECHNIQUE: Multidetector CT imaging of the lumbar spine was performed without intravenous contrast administration. Multiplanar CT image reconstructions were also generated. RADIATION DOSE REDUCTION: This exam was performed according to the departmental dose-optimization program which includes automated exposure control, adjustment of the mA and/or kV according to patient size and/or use of iterative reconstruction technique. COMPARISON:  Abdominal radiographs 10/28/2023, abdominopelvic CT 10/25/2023, CT lumbar spine 05/12/2023 and lumbar MRI 09/07/2023. FINDINGS: Segmentation: Transitional lumbosacral anatomy. In keeping with previous imaging,  there is a transitional, partially lumbarized S1 segment. Alignment: Minimal convex left scoliosis. No focal angulation or listhesis. Vertebrae: Interval posterior decompression on the left at L3-4 and L4-5. No evidence of acute fracture, pars defect or aggressive osseous lesion. No evidence of discitis/osteomyelitis. Paraspinal and other soft tissues: No unexpected paraspinal findings. Multiple loops of dilated fluid-filled small bowel are incompletely visualized, as seen on recent imaging and favored to reflect postoperative ileus. Disc levels: No acute disc space findings are identified. There is improved patency of the spinal canal at the L3-4 and L4-5 levels following left laminectomy. No evidence for epidural fluid collection. Grossly stable foraminal narrowing at both levels, worse on the left at L4-5. Chronic ankylosis across the L5-S1 disc and facet joints. IMPRESSION: 1. Interval posterior decompression on the left at L3-4 and L4-5 with improved patency of the spinal canal. No evidence of epidural fluid collection or other unexpected postoperative findings. 2. No acute osseous findings. 3. Multiple loops of dilated fluid-filled small bowel are incompletely visualized. These were favored to reflect postoperative ileus on recent imaging. Radiographic follow up recommended. Electronically Signed   By: Elsie Perone M.D.   On: 10/29/2023 16:43     Medications:    heparin  2,550 Units/hr (10/30/23 0900)   norepinephrine  (LEVOPHED ) Adult infusion Stopped (10/30/23 0630)    amiodarone   100 mg Oral Daily   capsaicin   1 Application Topical BID   Chlorhexidine  Gluconate Cloth  6 each Topical Daily   feeding supplement  1 Container Oral TID BM   Gerhardt's butt cream   Topical BID   insulin  aspart  0-20 Units Subcutaneous Q4H   levothyroxine   50 mcg Oral Q0600   lidocaine   1 patch Transdermal Q24H   melatonin  5 mg Oral QHS   midodrine   10 mg Oral TID WC   sodium chloride  flush  10-40 mL  Intracatheter Q12H   thiamine  (VITAMIN B1) injection  100 mg Intravenous Q24H    Assessment/ Plan:     73 y.o with significant PMH of lumbar spinal stenosis L3-4, L4-5 laminotomy/foraminotomies, T2DM, PAF, systolic CHF EF 35% s/p ICD placement, CAD status post [name], HTN, HLD who presented to the ED with chief complaints of generalized weakness with associated nausea and vomiting found to have ileus.  #1: Acute kidney injury: Urine output has been improving.  Renal indices are slowly but steadily improving.  #2: Septic shock/hypotension: Blood pressure is much better.  Being weaned off of norepinephrine .  Completed IV antibiotics.  #3: Ileus: Now resolving.  Labs and medications reviewed. Will  continue to follow along with you.   LOS: 6 Dickey Caamano, MD [facility] 1/4/202512:06 PM

## 2023-10-30 NOTE — Progress Notes (Signed)
 ANTICOAGULATION CONSULT NOTE  Pharmacy Consult for Heparin  Infusion Indication: atrial fibrillation  Patient Measurements: Height: 6' 4 (193 cm) Weight: 113.2 kg (249 lb 9 oz) IBW/kg (Calculated) : 86.8 Heparin  Dosing Weight: 109.9 kg  Labs: Recent Labs     0000 10/27/23 2134 10/28/23 0510 10/28/23 1451 10/29/23 0412 10/29/23 0853 10/29/23 1727 10/30/23 0407 10/30/23 1216  HGB   < >  --  11.8*  --  11.3*  --   --  11.2*  --   HCT  --   --  34.6*  --  32.6*  --   --  33.6*  --   PLT  --   --  135*  --  129*  --   --  105*  --   APTT  --  29  --   --   --   --   --   --   --   LABPROT  --  15.0  --   --   --   --   --   --   --   INR  --  1.2  --   --   --   --   --   --   --   HEPARINUNFRC  --   --  <0.10*   < >  --    < > 0.28* 0.48 0.42  CREATININE  --   --  3.95*  --  3.44*  --   --  2.79*  --    < > = values in this interval not displayed.    Estimated Creatinine Clearance: 32.5 mL/min (A) (by C-G formula based on SCr of 2.79 mg/dL (H)).   Medical History: Past Medical History:  Diagnosis Date   AICD (automatic cardioverter/defibrillator) present    [employer]   Arthritis    CHF (congestive heart failure) (HCC)    Coronary artery disease involving native coronary artery with unstable angina pectoris (HCC) 04/13/2016   Coronary artery disease with history of myocardial infarction without history of [name]    Dr. Quin   Cough    CHRONIC   Diabetes mellitus without complication (HCC)    Edema    FEET/LEGS   Essential hypertension    Hypertension    Hypothyroidism    Left main coronary artery disease 04/13/2016   Mixed hyperlipidemia    Obesity    PAF (paroxysmal atrial fibrillation) (HCC)    S/P [name] 04/14/2016   LIMA to LAD, SVG to D1, SVG to OM, SVG to PDA, EVH via right thigh and leg   Type II diabetes mellitus (HCC)    Unstable angina (HCC) 04/13/2016   Assessment: Pt is a 73 yo male admitted on 10/25/23 with ileus, with h/o A fib on Eliquis  5  mg BID.  Eliquis  not resumed since admission, pt ordered heparin  5000 units sub-Q q8hr starting 12/29, last dose 10/26/22 @ 1327.  Goal of Therapy:  Heparin  level 0.3-0.7 units/ml Monitor platelets by anticoagulation protocol: Yes  1/3@0015  HL = 0.25 Subtherapeutic  1/3@0853  HL = 0.32 Therapeutic x 1 1/3@1727  HL = 0.28 Subtherapeutic  1/4@0407        HL = 0.48        Therapeutic X 1  1/4@1216  HL = 0.42 Therapeutic X 2   Plan:  - Therapeutic X 2 - Will continue pt on current rate and recheck HL in 8 hrs on 1/4 @ 1200.  - Daily CBC per protocol while on IV heparin   Zygmunt Mcglinn A  Brelan Hannen, PharmD 10/30/2023 12:39 PM

## 2023-10-30 NOTE — Progress Notes (Signed)
 NAME:  DAELIN HASTE, MRN:  [id number], DOB:  09/07/1951, LOS: 6 ADMISSION DATE:  10/24/2023, CONSULTATION DATE:  10/25/23 REFERRING MD: Jawo, Modou NP CHIEF COMPLAINT: Generalized weakness   HPI  73 y.o with significant PMH of lumbar spinal stenosis L3-4, L4-5 laminotomy/foraminotomies, T2DM, PAF, systolic CHF EF 35% s/p ICD placement, CAD status post [name], HTN, HLD who presented to the ED with chief complaints of generalized weakness with associated nausea and vomiting since discharge from the hospital following admission for uncomplicated L3-4, L4-5 laminotomy and foraminotomies on 10/19/2023.   Past Medical History  Lumbar spinal stenosis L3-4, L4-5 laminotomy/foraminotomies, T2DM, PAF, systolic CHF EF 35% s/p ICD placement, CAD status post [name], HTN, HLD  Significant Hospital Events   12/29: Admitted to MedSurg unit with suspected postop ileus NG tube placed for decompression.  Overnight became hypoglycemic requiring dextrose  infusion 12/30: Rapid response called for unresponsiveness, hypoglycemia in the low 20s and hypertension (70/37).  Transferred to ICU PCCM consulted 10/26/23 -patient critically ill, on levophed , has not had nourishment in >1 wk.  Plan for PICC. Blood cultures pending, renal function improved but still severe AKI, repeat KUB today showed OGT got displaced to airway , this was replaced and repeated with repeat imaging showing good placement.  10/26/22- patient more oriented, continues to require levophed .  Sacral wound covered.  Levophed  and D5-1/2NS started.   10/27/22- patient had BM overnight, ilius with 6.1cm dilated loops of bowel on KUB this am with NG to suction still, abdomen distended.  [name] ongoing.  PICC line with [name] ongoing. TTE reassuring, remains on 2 vasopressors.  Discussed with dietary and will try trickle clears today due to clinically less abd tenderness.  10/29/23- patient had BM overnight with resolution of ilius, transitioning to PO liquid diet,  still is on levophed  5 + midodrine  10 TID, reducing IV amio and plan to transition to PO as able. 10/30/23-   Consults:  PCCM  Procedures:  12/30 right femoral art line 12/30 right femoral central line  Significant Diagnostic Tests:  12/29: Chest Xray> IMPRESSION: 1. Minor left basilar atelectasis. Otherwise no acute cardiopulmonary findings. 2. Gaseous distension of bowel within the included upper abdomen.  12/30: Noncontrast CT head> IMPRESSION: No acute intracranial abnormality.  12/30: CTA abdomen and pelvis> IMPRESSION: 1. Findings favoring ileus with persistent air and fluid distension of the small bowel, ascending, and transverse. There is no discrete transition point. Enteric tube decompresses the stomach with diminished gastric distension from yesterday. 2. No free air or mesenteric gas. 3. Progressive atelectasis in the medial left greater than right lower lobes. 4. Moderate-sized fat containing umbilical hernia.    Micro Data:  12/29: Blood culture x2> 12/30: MRSA PCR>>   Antimicrobials:  Azithromycin  12/29 Ceftriaxone  12/29  OBJECTIVE  Blood pressure (!) 86/49, pulse 94, temperature 97.9 F (36.6 C), temperature source Oral, resp. rate (!) 21, height 6' 4 (1.93 m), weight 113.2 kg, SpO2 93%.       Intake/Output Summary (Last 24 hours) at 10/30/2023 0915 Last data filed at 10/30/2023 0900 Gross per 24 hour  Intake 3724.1 ml  Output 3990 ml  Net -265.9 ml   Filed Weights   10/28/23 0500 10/29/23 0500 10/30/23 0400  Weight: 112.8 kg 113 kg 113.2 kg    Physical Examination  GENERAL: 72 year-old critically ill patient lying in the bed in no acute distress EYES: PEERLA. No scleral icterus. Extraocular muscles intact.  HEENT: Head atraumatic, normocephalic. Oropharynx and nasopharynx clear.  NECK:  No JVD, supple  LUNGS: Normal breath sounds bilaterally.  No use of accessory muscles of respiration.  CARDIOVASCULAR: S1, S2 normal. No murmurs, rubs, or  gallops.  ABDOMEN: Soft, NTND EXTREMITIES: trace swelling.  Capillary refill < 3 seconds in all extremities. Pulses palpable distally. NEUROLOGIC: The patient is  oriented to self only. No focal neurological deficit appreciated. Cranial nerves are intact.  SKIN: No obvious rash, lesion, or ulcer. Warm to touch Labs/imaging that I havepersonally reviewed  (right click and Reselect all SmartList Selections daily)     Labs   CBC: Recent Labs  Lab 10/26/23 0419 10/27/23 0347 10/28/23 0510 10/29/23 0412 10/30/23 0407  WBC 7.2 7.1 8.3 9.5 9.6  HGB 11.4* 11.7* 11.8* 11.3* 11.2*  HCT 32.8* 34.1* 34.6* 32.6* 33.6*  MCV 87.0 90.5 92.0 89.3 91.1  PLT 131* 129* 135* 129* 105*    Basic Metabolic Panel: Recent Labs  Lab 10/24/23 0700 10/25/23 0205 10/26/23 0419 10/27/23 0347 10/28/23 0510 10/29/23 0412 10/30/23 0407  NA 130*   < > 133* 138 138 142 140  K 4.4   < > 4.2 3.8 3.8 3.6 3.3*  CL 93*   < > 100 103 106 111 109  CO2 13*   < > 21* 23 22 21* 21*  GLUCOSE 166*   < > 210* 151* 246* 177* 140*  BUN 89*   < > 96* 103* 107* 93* 80*  CREATININE 3.81*   < > 3.60* 4.26* 3.95* 3.44* 2.79*  CALCIUM  9.5   < > 8.1* 8.2* 8.1* 8.3* 8.3*  MG  --   --  2.0 2.3 2.2 2.0  --   PHOS 4.2  --  3.5 3.2 3.1 3.0  --    < > = values in this interval not displayed.   GFR: Estimated Creatinine Clearance: 32.5 mL/min (A) (by C-G formula based on SCr of 2.79 mg/dL (H)). Recent Labs  Lab 10/24/23 1314 10/24/23 1620 10/25/23 0205 10/25/23 0821 10/26/23 0419 10/26/23 1154 10/27/23 0347 10/27/23 1340 10/28/23 0510 10/29/23 0412 10/30/23 0407  PROCALCITON 11.21  --  35.61  --  51.30  --   --   --   --   --   --   WBC  --   --  6.4  --  7.2  --  7.1  --  8.3 9.5 9.6  LATICACIDVEN 8.4*   < > 4.2* 3.1*  --  1.4  --  0.9  --   --   --    < > = values in this interval not displayed.    Liver Function Tests: Recent Labs  Lab 10/24/23 0700 10/25/23 0205 10/26/23 0419 10/27/23 0347  10/28/23 0510  AST 84* 227* 131* 78* 40  ALT 118* 147* 125* 95* 70*  ALKPHOS 57 43 50 45 46  BILITOT 1.9* 0.8 1.0 0.8 0.8  PROT 7.2 5.3* 5.1* 4.8* 5.0*  ALBUMIN  3.6 2.7* 2.4* 2.2* 2.1*   Recent Labs  Lab 10/26/23 1237  LIPASE 19   No results for input(s): AMMONIA in the last 168 hours.  ABG    Component Value Date/Time   PHART 7.35 10/25/2023 0317   PCO2ART 27 (L) 10/25/2023 0317   PO2ART 75 (L) 10/25/2023 0317   HCO3 14.9 (L) 10/25/2023 0317   TCO2 26 04/15/2016 1707   ACIDBASEDEF 9.1 (H) 10/25/2023 0317   O2SAT 97.1 10/25/2023 0317     Coagulation Profile: Recent Labs  Lab 10/27/23 2134  INR 1.2  Cardiac Enzymes: No results for input(s): CKTOTAL, CKMB, CKMBINDEX, TROPONINI in the last 168 hours.  HbA1C: Hgb A1c MFr Bld  Date/Time Value Ref Range Status  10/13/2023 12:00 PM 5.8 (H) 4.8 - 5.6 % Final    Comment:    (NOTE) Pre diabetes:          5.7%-6.4%  Diabetes:              >6.4%  Glycemic control for   <7.0% adults with diabetes   04/13/2016 06:37 PM 7.6 (H) 4.8 - 5.6 % Final    Comment:    (NOTE)         Pre-diabetes: 5.7 - 6.4         Diabetes: >6.4         Glycemic control for adults with diabetes: <7.0     CBG: Recent Labs  Lab 10/29/23 1606 10/29/23 1942 10/29/23 2343 10/30/23 0406 10/30/23 0748  GLUCAP 151* 165* 91 143* 117*    Review of Systems:   10 point ROS done and is negative except as per subjective findings  Past Medical History  He,  has a past medical history of AICD (automatic cardioverter/defibrillator) present, Arthritis, CHF (congestive heart failure) (HCC), Coronary artery disease involving native coronary artery with unstable angina pectoris (HCC) (04/13/2016), Coronary artery disease with history of myocardial infarction without history of [name], Cough, Diabetes mellitus without complication (HCC), Edema, Essential hypertension, Hypertension, Hypothyroidism, Left main coronary artery disease  (04/13/2016), Mixed hyperlipidemia, Obesity, PAF (paroxysmal atrial fibrillation) (HCC), S/P [name] (04/14/2016), Type II diabetes mellitus (HCC), and Unstable angina (HCC) (04/13/2016).   Surgical History    Past Surgical History:  Procedure Laterality Date   BACK SURGERY     Lumbar Surgeries. [place]. Dr. Drury x2   CARDIAC CATHETERIZATION Left 04/13/2016   Procedure: Left Heart Cath and Coronary Angiography;  Surgeon: Wolm JINNY Rhyme, MD;  Location: [facility] INVASIVE CV LAB;  Service: Cardiovascular;  Laterality: Left;   CARDIOVERSION N/A 08/02/2018   Procedure: CARDIOVERSION;  Surgeon: Rhyme Wolm JINNY, MD;  Location: [facility] ORS;  Service: Cardiovascular;  Laterality: N/A;   CATARACT EXTRACTION W/PHACO Right 01/23/2016   Procedure: CATARACT EXTRACTION PHACO AND INTRAOCULAR LENS PLACEMENT (IOC);  Surgeon: Elsie Carmine, MD;  Location: [facility] ORS;  Service: Ophthalmology;  Laterality: Right;  US  01:18 AP% 23.8 CDE 18.67 fluid pack lot # [id number]   CHOLECYSTECTOMY     CORONARY ARTERY BYPASS GRAFT N/A 04/14/2016   Procedure: CORONARY ARTERY BYPASS GRAFTING TIMES FOUR    USING LEFT INTERNAL MAMMARY AND ENDOSCOPIC HARVEST RIGHT SAPHENOUS VEIN;  Surgeon: Sudie VEAR Laine, MD;  Location: [facility] OR;  Service: Open Heart Surgery;  Laterality: N/A;   EYE SURGERY Bilateral    Cataract Extraction with IOL implant.   INTRAOPERATIVE TRANSESOPHAGEAL ECHOCARDIOGRAM N/A 04/14/2016   Procedure: INTRAOPERATIVE TRANSESOPHAGEAL ECHOCARDIOGRAM;  Surgeon: Sudie VEAR Laine, MD;  Location: [facility] OR;  Service: Open Heart Surgery;  Laterality: N/A;   KNEE ARTHROSCOPY Left 10/08/2015   Procedure: ARTHROSCOPY KNEE, PARTIAL MEDIAL AND LATERAL MENISECTOMY, REMOVAL OF FOREIGN BODY;  Surgeon: Ozell Flake, MD;  Location: [facility] ORS;  Service: Orthopedics;  Laterality: Left;   LUMBAR LAMINECTOMY/DECOMPRESSION MICRODISCECTOMY Bilateral 10/18/2023   Procedure: Lumbar Three-Four, Lumbar Four-Five  Laminotomy,  Foraminotomy;  Surgeon: Mavis Purchase, MD;  Location[facility];  Service: Neurosurgery;  Laterality: Bilateral;  3C     Social History   reports that he has never smoked. His smokeless tobacco use includes chew. He reports that he  does not drink alcohol and does not use drugs.   Family History   His family history includes Aortic stenosis in his father; Heart disease in his father; Hypertension in his father.   Allergies Allergies  Allergen Reactions   Aspirin Anaphylaxis   Inderal [Propranolol]     Other reaction(s): Other (See Comments) Fatigue   Metformin  Hcl Diarrhea    Pt still takes medication   Mevacor [Lovastatin] Other (See Comments)    muscle Pain   Penicillin G Hives   Pravachol [Pravastatin Sodium] Other (See Comments)    Muscle Pain   Zocor [Simvastatin] Other (See Comments)    Muscle Pain   Penicillins Rash    Has patient had a PCN reaction causing immediate rash, facial/tongue/throat swelling, SOB or lightheadedness with hypotension: Yes Has patient had a PCN reaction causing severe rash involving mucus membranes or skin necrosis: Yes Has patient had a PCN reaction that required hospitalization No Has patient had a PCN reaction occurring within the last 10 years: Yes If all of the above answers are NO, then may proceed with Cephalosporin use.      Home Medications  Prior to Admission medications   Medication Sig Start Date End Date Taking? Authorizing Provider  acetaminophen  (TYLENOL ) 500 MG tablet Take 500-1,000 mg by mouth every 6 (six) hours as needed (pain.).    [provider]  amiodarone  (PACERONE ) 200 MG tablet Take 100 mg by mouth in the morning.    [provider]  apixaban  (ELIQUIS ) 5 MG TABS tablet Take 1 tablet (5 mg total) by mouth 2 (two) times daily. 10/25/23   Johnanna Credit Caylin, PA-C  cyclobenzaprine  (FLEXERIL ) 10 MG tablet Take 1 tablet (10 mg total) by mouth 3 (three) times daily as needed for muscle spasms. 10/19/23    Tomlinson, Sara Caylin, PA-C  dapagliflozin  propanediol (FARXIGA ) 10 MG TABS tablet Take 10 mg by mouth in the morning.    [provider]  docusate sodium  (COLACE) 100 MG capsule Take 1 capsule (100 mg total) by mouth 2 (two) times daily. 10/19/23 10/18/24  Johnanna Credit Caylin, PA-C  gabapentin  (NEURONTIN ) 100 MG capsule Take 100 mg by mouth at bedtime as needed (nerve pain.). 06/30/23   [provider]  glimepiride  (AMARYL ) 4 MG tablet Take 4 mg by mouth 2 (two) times daily. Reported on 04/13/2016    [provider]  levothyroxine  (SYNTHROID ) 50 MCG tablet Take 50 mcg by mouth daily at 2 am. 08/08/23   [provider]  metFORMIN  (GLUCOPHAGE ) 1000 MG tablet Take 1,000 mg by mouth in the morning and at bedtime.    [provider]  metoprolol  succinate (TOPROL -XL) 100 MG 24 hr tablet Take 100 mg by mouth in the morning. Take with or immediately following a meal.    [provider]  Oxycodone  HCl 10 MG TABS Take 1 tablet (10 mg total) by mouth every 4 (four) hours as needed for severe pain (pain score 7-10). 10/19/23   Johnanna Credit Caylin, PA-C  rosuvastatin  (CRESTOR ) 40 MG tablet Take 20 mg by mouth at bedtime. 07/01/23   [provider]  sacubitril -valsartan  (ENTRESTO ) 49-51 MG Take 1 tablet by mouth 2 (two) times daily.    [provider]  torsemide  (DEMADEX ) 20 MG tablet Take 20 mg by mouth in the morning. 11/23/22   [provider]  Scheduled Meds:  capsaicin   1 Application Topical BID   Chlorhexidine  Gluconate Cloth  6 each Topical Daily   feeding supplement  1  Container Oral TID BM   Gerhardt's butt cream   Topical BID   insulin  aspart  0-20 Units Subcutaneous Q4H   levothyroxine   50 mcg Oral Q0600   lidocaine   1 patch Transdermal Q24H   melatonin  5 mg Oral QHS   midodrine   10 mg Oral TID WC   sodium chloride  flush  10-40 mL Intracatheter Q12H   thiamine  (VITAMIN B1) injection  100 mg Intravenous Q24H    Continuous Infusions:  amiodarone  15 mg/hr (10/30/23 0800)   heparin  2,550 Units/hr (10/30/23 0800)   norepinephrine  (LEVOPHED ) Adult infusion Stopped (10/30/23 0630)   piperacillin -tazobactam (ZOSYN )  IV Stopped (10/30/23 0554)   PRN Meds:.fentaNYL  (SUBLIMAZE ) injection, mouth rinse, polyethylene glycol, senna-docusate, sodium chloride  flush   Active Hospital Problem list   See systems below  Assessment & Plan:   #Ileus- RESOLVED Suspect post op related due to resent spinal surgery Repeat CT abd/pelvis- Dilated loops of bowel.  Patient had multiple BMs. -Surgery consult- Discussed case with Dr Desiderio unofficially does not appear to need surgical intervention at this moment.   -Hold narcotics -s/p [name] will try clear PO diet low dose , [name] has been DCD  #Sepic shock-  due to Suspected Intra-abdominal source-PRESENT ON ADMISSION -sacral wounds present on admission  -levophed  and vasopressin   -F/u cultures, trend lactic/ PCT -Monitor WBC/ fever curve -IV antibiotics: ceftriaxone  and Azithromycin >>ZOSYN  Iv completed 7d abx -IVF hydration as needed -Pressors for MAP goal >65 -Strict I/O's - CT lumbar spine no conerning findings  #AKI Likely ATN~improved  #Hyponatremia #NAGMA with Lactic Acidosis -Trend Lactate -Avoid nephrotoxic agents  -Follow BMP -Replace electrolytes as indicated -Nephrology consult   #Elevated LFTs -shock liver in the setting of severe sepsis with hypotension -Trend cmp  #Mildly Elevated Troponin, suspect demand ischemia #HFrEF  EF 35% s/p ICD placement  #Hypertension PMHx: CAD s/p [name] X4, PAF, HLD BNP mildly elevated without overt signs of volume overload or pulmonary edema -Trend HS Troponin until peaked -Hold torsemide , Entresto , metoprolol , Farxiga  and amiodarone   -Continue rosuvastatin  once able to tolerate p.o. -Hold Eliquis  for now -Obtain 2D echo  #T2 Diabetes mellitus  -Check hemoglobin A1c -CBGs -Sliding scale insulin  -Follow  ICU hyper/hypoglycemia protocol -Hold metformin  and glimepiride    #Chronic Pain #Hx of Chronic Bilateral Low Back Pain  #hx of severe lumbar stenosis from L3-L5 with right lower extremity radiculopathy s/p  L3-4, L4-5 laminotomy and foraminotomies on 10/19/2023  -Follows with Neurosugeon  -on Gabapentin  and Flexeril , and nerve block PRN  #Hypothyroidism -Check TSH and free T4 -Continue home Synthroid   Best practice:  Diet:  NPO Pain/Anxiety/Delirium protocol (if indicated): No VAP protocol (if indicated): Not indicated DVT prophylaxis: Subcutaneous Heparin  GI prophylaxis: PPI Glucose control:  SSI No Central venous access:  Yes, and it is still needed Arterial line:  Yes, and it is still needed Foley:  N/A Mobility:  bed rest  PT consulted: N/A Last date of multidisciplinary goals of care discussion [updated wife over the phone] Code Status:  full code Disposition: ICU   = Goals of Care = Code Status Order: FULL  Primary Emergency Contact: Lindell,Katie V, Home Phone: [phone] Wishes to pursue full aggressive treatment and intervention options, including CPR and intubation, but goals of care will be addressed on going with family if that should become necessary.  Critical care provider statement:   Total critical care time: 33 minutes   Performed by: Parris MD   Critical care time was exclusive of separately billable procedures and treating other  patients.   Critical care was necessary to treat or prevent imminent or life-threatening deterioration.   Critical care was time spent personally by me on the following activities: development of treatment plan with patient and/or surrogate as well as nursing, discussions with consultants, evaluation of patient's response to treatment, examination of patient, obtaining history from patient or surrogate, ordering and performing treatments and interventions, ordering and review of laboratory studies, ordering and review of  radiographic studies, pulse oximetry and re-evaluation of patient's condition.    Ruey Storer, M.D.  Pulmonary & Critical Care Medicine

## 2023-10-30 NOTE — Plan of Care (Signed)

## 2023-10-30 NOTE — Progress Notes (Signed)
 Inpatient Rehab Admissions Coordinator:    PT/OT are now both recommending SNF for this Pt. CIR will sign off. I will notify TOC.   Megan Salon, MS, CCC-SLP Rehab Admissions Coordinator  [phone] (celll) [phone] (office)

## 2023-10-30 NOTE — Progress Notes (Signed)
 ANTICOAGULATION CONSULT NOTE  Pharmacy Consult for Heparin  Infusion Indication: atrial fibrillation  Patient Measurements: Height: 6' 4 (193 cm) Weight: 113.2 kg (249 lb 9 oz) IBW/kg (Calculated) : 86.8 Heparin  Dosing Weight: 109.9 kg  Labs: Recent Labs     0000 10/27/23 2134 10/28/23 0510 10/28/23 1451 10/29/23 0412 10/29/23 0853 10/29/23 1727 10/30/23 0407  HGB   < >  --  11.8*  --  11.3*  --   --  11.2*  HCT  --   --  34.6*  --  32.6*  --   --  33.6*  PLT  --   --  135*  --  129*  --   --  105*  APTT  --  29  --   --   --   --   --   --   LABPROT  --  15.0  --   --   --   --   --   --   INR  --  1.2  --   --   --   --   --   --   HEPARINUNFRC  --   --  <0.10*   < >  --  0.32 0.28* 0.48  CREATININE  --   --  3.95*  --  3.44*  --   --   --    < > = values in this interval not displayed.    Estimated Creatinine Clearance: 26.3 mL/min (A) (by C-G formula based on SCr of 3.44 mg/dL (H)).   Medical History: Past Medical History:  Diagnosis Date   AICD (automatic cardioverter/defibrillator) present    [employer]   Arthritis    CHF (congestive heart failure) (HCC)    Coronary artery disease involving native coronary artery with unstable angina pectoris (HCC) 04/13/2016   Coronary artery disease with history of myocardial infarction without history of [name]    Dr. Quin   Cough    CHRONIC   Diabetes mellitus without complication (HCC)    Edema    FEET/LEGS   Essential hypertension    Hypertension    Hypothyroidism    Left main coronary artery disease 04/13/2016   Mixed hyperlipidemia    Obesity    PAF (paroxysmal atrial fibrillation) (HCC)    S/P [name] 04/14/2016   LIMA to LAD, SVG to D1, SVG to OM, SVG to PDA, EVH via right thigh and leg   Type II diabetes mellitus (HCC)    Unstable angina (HCC) 04/13/2016   Assessment: Pt is a 73 yo male admitted on 10/25/23 with ileus, with h/o A fib on Eliquis  5 mg BID.  Eliquis  not resumed since admission, pt ordered  heparin  5000 units sub-Q q8hr starting 12/29, last dose 10/26/22 @ 1327.  Goal of Therapy:  Heparin  level 0.3-0.7 units/ml Monitor platelets by anticoagulation protocol: Yes  1/3@0015  HL = 0.25 Subtherapeutic  1/3@0853  HL = 0.32 Therapeutic x 1 1/3@1727  HL = 0.28 Subtherapeutic  1/4@0407        HL = 0.48        Therapeutic X 1    Plan:  1/4: HL @ 0407 = 0.48, therapeutic X 1 - Will continue pt on current rate and recheck HL in 8 hrs on 1/4 @ 1200.  Daily CBC per protocol while on IV heparin   Ashli Selders D, PharmD 10/30/2023 4:44 AM

## 2023-10-30 NOTE — Progress Notes (Signed)
 NAME:  Jason [name], MRN:  [id number], DOB:  04-Dec-1950, LOS: 6 ADMISSION DATE:  10/24/2023, CONSULTATION DATE:  10/25/23 REFERRING MD: Jawo, Modou NP CHIEF COMPLAINT: Generalized weakness   HPI  73 y.o with significant PMH of lumbar spinal stenosis L3-4, L4-5 laminotomy/foraminotomies, T2DM, PAF, systolic CHF EF 35% s/p ICD placement, CAD status post [name], HTN, HLD who presented to the ED with chief complaints of generalized weakness with associated nausea and vomiting since discharge from the hospital following admission for uncomplicated L3-4, L4-5 laminotomy and foraminotomies on 10/19/2023.   Past Medical History  Lumbar spinal stenosis L3-4, L4-5 laminotomy/foraminotomies, T2DM, PAF, systolic CHF EF 35% s/p ICD placement, CAD status post [name], HTN, HLD  Significant Hospital Events   12/29: Admitted to MedSurg unit with suspected postop ileus NG tube placed for decompression.  Overnight became hypoglycemic requiring dextrose  infusion 12/30: Rapid response called for unresponsiveness, hypoglycemia in the low 20s and hypertension (70/37).  Transferred to ICU PCCM consulted 10/26/23 -patient critically ill, on levophed , has not had nourishment in >1 wk.  Plan for PICC. Blood cultures pending, renal function improved but still severe AKI, repeat KUB today showed OGT got displaced to airway , this was replaced and repeated with repeat imaging showing good placement.  10/26/22- patient more oriented, continues to require levophed .  Sacral wound covered.  Levophed  and D5-1/2NS started.   10/27/22- patient had BM overnight, ilius with 6.1cm dilated loops of bowel on KUB this am with NG to suction still, abdomen distended.  [name] ongoing.  PICC line with [name] ongoing. TTE reassuring, remains on 2 vasopressors.  Discussed with dietary and will try trickle clears today due to clinically less abd tenderness.  10/29/23- patient had BM overnight with resolution of ilius, transitioning to PO liquid diet,  still is on levophed  5 + midodrine  10 TID, reducing IV amio and plan to transition to PO as able. 10/30/23- patient is optimized with TRH transfer today.   He is eating and is more lucid. His shock physiology has been improved quite a bit.   Consults:  PCCM  Procedures:  12/30 right femoral art line 12/30 right femoral central line  Significant Diagnostic Tests:  12/29: Chest Xray> IMPRESSION: 1. Minor left basilar atelectasis. Otherwise no acute cardiopulmonary findings. 2. Gaseous distension of bowel within the included upper abdomen.  12/30: Noncontrast CT head> IMPRESSION: No acute intracranial abnormality.  12/30: CTA abdomen and pelvis> IMPRESSION: 1. Findings favoring ileus with persistent air and fluid distension of the small bowel, ascending, and transverse. There is no discrete transition point. Enteric tube decompresses the stomach with diminished gastric distension from yesterday. 2. No free air or mesenteric gas. 3. Progressive atelectasis in the medial left greater than right lower lobes. 4. Moderate-sized fat containing umbilical hernia.    Micro Data:  12/29: Blood culture x2> 12/30: MRSA PCR>>   Antimicrobials:  Azithromycin  12/29 Ceftriaxone  12/29  OBJECTIVE  Blood pressure 126/62, pulse 89, temperature 97.9 F (36.6 C), temperature source Oral, resp. rate (!) 24, height 6' 4 (1.93 m), weight 113.2 kg, SpO2 94%.       Intake/Output Summary (Last 24 hours) at 10/30/2023 1601 Last data filed at 10/30/2023 1500 Gross per 24 hour  Intake 2564.17 ml  Output 4865 ml  Net -2300.83 ml   Filed Weights   10/28/23 0500 10/29/23 0500 10/30/23 0400  Weight: 112.8 kg 113 kg 113.2 kg    Physical Examination  GENERAL: 73 year-old  in no acute  distress EYES: PEERLA. No scleral icterus. Extraocular muscles intact.  HEENT: Head atraumatic, normocephalic. Oropharynx and nasopharynx clear.  NECK:  No JVD, supple  LUNGS: Normal breath sounds bilaterally.  No use  of accessory muscles of respiration.  CARDIOVASCULAR: S1, S2 normal. No murmurs, rubs, or gallops.  ABDOMEN: Soft, NTND EXTREMITIES: trace swelling.  Capillary refill < 3 seconds in all extremities. Pulses palpable distally. NEUROLOGIC: The patient is  oriented to self only. No focal neurological deficit appreciated. Cranial nerves are intact.  SKIN: No obvious rash, lesion, or ulcer. Warm to touch Labs/imaging that I havepersonally reviewed  (right click and Reselect all SmartList Selections daily)     Labs   CBC: Recent Labs  Lab 10/26/23 0419 10/27/23 0347 10/28/23 0510 10/29/23 0412 10/30/23 0407  WBC 7.2 7.1 8.3 9.5 9.6  HGB 11.4* 11.7* 11.8* 11.3* 11.2*  HCT 32.8* 34.1* 34.6* 32.6* 33.6*  MCV 87.0 90.5 92.0 89.3 91.1  PLT 131* 129* 135* 129* 105*    Basic Metabolic Panel: Recent Labs  Lab 10/24/23 0700 10/25/23 0205 10/26/23 0419 10/27/23 0347 10/28/23 0510 10/29/23 0412 10/30/23 0407  NA 130*   < > 133* 138 138 142 140  K 4.4   < > 4.2 3.8 3.8 3.6 3.3*  CL 93*   < > 100 103 106 111 109  CO2 13*   < > 21* 23 22 21* 21*  GLUCOSE 166*   < > 210* 151* 246* 177* 140*  BUN 89*   < > 96* 103* 107* 93* 80*  CREATININE 3.81*   < > 3.60* 4.26* 3.95* 3.44* 2.79*  CALCIUM  9.5   < > 8.1* 8.2* 8.1* 8.3* 8.3*  MG  --   --  2.0 2.3 2.2 2.0  --   PHOS 4.2  --  3.5 3.2 3.1 3.0  --    < > = values in this interval not displayed.   GFR: Estimated Creatinine Clearance: 32.5 mL/min (A) (by C-G formula based on SCr of 2.79 mg/dL (H)). Recent Labs  Lab 10/24/23 1314 10/24/23 1620 10/25/23 0205 10/25/23 0821 10/26/23 0419 10/26/23 1154 10/27/23 0347 10/27/23 1340 10/28/23 0510 10/29/23 0412 10/30/23 0407  PROCALCITON 11.21  --  35.61  --  51.30  --   --   --   --   --   --   WBC  --   --  6.4  --  7.2  --  7.1  --  8.3 9.5 9.6  LATICACIDVEN 8.4*   < > 4.2* 3.1*  --  1.4  --  0.9  --   --   --    < > = values in this interval not displayed.    Liver Function  Tests: Recent Labs  Lab 10/25/23 0205 10/26/23 0419 10/27/23 0347 10/28/23 0510 10/30/23 1216  AST 227* 131* 78* 40 23  ALT 147* 125* 95* 70* 36  ALKPHOS 43 50 45 46 38  BILITOT 0.8 1.0 0.8 0.8 0.5  PROT 5.3* 5.1* 4.8* 5.0* 5.0*  ALBUMIN  2.7* 2.4* 2.2* 2.1* 2.2*   Recent Labs  Lab 10/26/23 1237  LIPASE 19   No results for input(s): AMMONIA in the last 168 hours.  ABG    Component Value Date/Time   PHART 7.35 10/25/2023 0317   PCO2ART 27 (L) 10/25/2023 0317   PO2ART 75 (L) 10/25/2023 0317   HCO3 14.9 (L) 10/25/2023 0317   TCO2 26 04/15/2016 1707   ACIDBASEDEF 9.1 (H) 10/25/2023 0317   O2SAT 97.1 10/25/2023  9682     Coagulation Profile: Recent Labs  Lab 10/27/23 2134  INR 1.2    Cardiac Enzymes: No results for input(s): CKTOTAL, CKMB, CKMBINDEX, TROPONINI in the last 168 hours.  HbA1C: Hgb A1c MFr Bld  Date/Time Value Ref Range Status  10/13/2023 12:00 PM 5.8 (H) 4.8 - 5.6 % Final    Comment:    (NOTE) Pre diabetes:          5.7%-6.4%  Diabetes:              >6.4%  Glycemic control for   <7.0% adults with diabetes   04/13/2016 06:37 PM 7.6 (H) 4.8 - 5.6 % Final    Comment:    (NOTE)         Pre-diabetes: 5.7 - 6.4         Diabetes: >6.4         Glycemic control for adults with diabetes: <7.0     CBG: Recent Labs  Lab 10/29/23 2343 10/30/23 0406 10/30/23 0748 10/30/23 1141 10/30/23 1517  GLUCAP 91 143* 117* 156* 194*    Review of Systems:   10 point ROS done and is negative except as per subjective findings  Past Medical History  He,  has a past medical history of AICD (automatic cardioverter/defibrillator) present, Arthritis, CHF (congestive heart failure) (HCC), Coronary artery disease involving native coronary artery with unstable angina pectoris (HCC) (04/13/2016), Coronary artery disease with history of myocardial infarction without history of [name], Cough, Diabetes mellitus without complication (HCC), Edema, Essential  hypertension, Hypertension, Hypothyroidism, Left main coronary artery disease (04/13/2016), Mixed hyperlipidemia, Obesity, PAF (paroxysmal atrial fibrillation) (HCC), S/P [name] (04/14/2016), Type II diabetes mellitus (HCC), and Unstable angina (HCC) (04/13/2016).   Surgical History    Past Surgical History:  Procedure Laterality Date   BACK SURGERY     Lumbar Surgeries. [place], [place]. Dr. Drury x2   CARDIAC CATHETERIZATION Left 04/13/2016   Procedure: Left Heart Cath and Coronary Angiography;  Surgeon: Wolm JINNY Rhyme, MD;  Location: [facility] INVASIVE CV LAB;  Service: Cardiovascular;  Laterality: Left;   CARDIOVERSION N/A 08/02/2018   Procedure: CARDIOVERSION;  Surgeon: Rhyme Wolm JINNY, MD;  Location: [facility] ORS;  Service: Cardiovascular;  Laterality: N/A;   CATARACT EXTRACTION W/PHACO Right 01/23/2016   Procedure: CATARACT EXTRACTION PHACO AND INTRAOCULAR LENS PLACEMENT (IOC);  Surgeon: Elsie Carmine, MD;  Location: [facility] ORS;  Service: Ophthalmology;  Laterality: Right;  US  01:18 AP% 23.8 CDE 18.67 fluid pack lot # [id number]   CHOLECYSTECTOMY     CORONARY ARTERY BYPASS GRAFT N/A 04/14/2016   Procedure: CORONARY ARTERY BYPASS GRAFTING TIMES FOUR    USING LEFT INTERNAL MAMMARY AND ENDOSCOPIC HARVEST RIGHT SAPHENOUS VEIN;  Surgeon: Sudie VEAR Laine, MD;  Location: [facility] OR;  Service: Open Heart Surgery;  Laterality: N/A;   EYE SURGERY Bilateral    Cataract Extraction with IOL implant.   INTRAOPERATIVE TRANSESOPHAGEAL ECHOCARDIOGRAM N/A 04/14/2016   Procedure: INTRAOPERATIVE TRANSESOPHAGEAL ECHOCARDIOGRAM;  Surgeon: Sudie VEAR Laine, MD;  Location: [facility] OR;  Service: Open Heart Surgery;  Laterality: N/A;   KNEE ARTHROSCOPY Left 10/08/2015   Procedure: ARTHROSCOPY KNEE, PARTIAL MEDIAL AND LATERAL MENISECTOMY, REMOVAL OF FOREIGN BODY;  Surgeon: Ozell Flake, MD;  Location: [facility] ORS;  Service: Orthopedics;  Laterality: Left;   LUMBAR LAMINECTOMY/DECOMPRESSION MICRODISCECTOMY Bilateral  10/18/2023   Procedure: Lumbar Three-Four, Lumbar Four-Five  Laminotomy, Foraminotomy;  Surgeon: Mavis Purchase, MD;  Location: [facility] OR;  Service: Neurosurgery;  Laterality: Bilateral;  3C     Social  History   reports that he has never smoked. His smokeless tobacco use includes chew. He reports that he does not drink alcohol and does not use drugs.   Family History   His family history includes Aortic stenosis in his father; Heart disease in his father; Hypertension in his father.   Allergies Allergies  Allergen Reactions   Aspirin Anaphylaxis   Inderal [Propranolol]     Other reaction(s): Other (See Comments) Fatigue   Metformin  Hcl Diarrhea    Pt still takes medication   Mevacor [Lovastatin] Other (See Comments)    muscle Pain   Penicillin G Hives   Pravachol [Pravastatin Sodium] Other (See Comments)    Muscle Pain   Zocor [Simvastatin] Other (See Comments)    Muscle Pain   Penicillins Rash    Has patient had a PCN reaction causing immediate rash, facial/tongue/throat swelling, SOB or lightheadedness with hypotension: Yes Has patient had a PCN reaction causing severe rash involving mucus membranes or skin necrosis: Yes Has patient had a PCN reaction that required hospitalization No Has patient had a PCN reaction occurring within the last 10 years: Yes If all of the above answers are NO, then may proceed with Cephalosporin use.      Home Medications  Prior to Admission medications   Medication Sig Start Date End Date Taking? Authorizing Provider  acetaminophen  (TYLENOL ) 500 MG tablet Take 500-1,000 mg by mouth every 6 (six) hours as needed (pain.).    [provider]  amiodarone  (PACERONE ) 200 MG tablet Take 100 mg by mouth in the morning.    [provider]  apixaban  (ELIQUIS ) 5 MG TABS tablet Take 1 tablet (5 mg total) by mouth 2 (two) times daily. 10/25/23   Johnanna Credit Caylin, PA-C  cyclobenzaprine  (FLEXERIL ) 10 MG tablet Take 1 tablet (10 mg  total) by mouth 3 (three) times daily as needed for muscle spasms. 10/19/23   Tomlinson, Sara Caylin, PA-C  dapagliflozin  propanediol (FARXIGA ) 10 MG TABS tablet Take 10 mg by mouth in the morning.    [provider]  docusate sodium  (COLACE) 100 MG capsule Take 1 capsule (100 mg total) by mouth 2 (two) times daily. 10/19/23 10/18/24  Johnanna Credit Caylin, PA-C  gabapentin  (NEURONTIN ) 100 MG capsule Take 100 mg by mouth at bedtime as needed (nerve pain.). 06/30/23   [provider]  glimepiride  (AMARYL ) 4 MG tablet Take 4 mg by mouth 2 (two) times daily. Reported on 04/13/2016    [provider]  levothyroxine  (SYNTHROID ) 50 MCG tablet Take 50 mcg by mouth daily at 2 am. 08/08/23   [provider]  metFORMIN  (GLUCOPHAGE ) 1000 MG tablet Take 1,000 mg by mouth in the morning and at bedtime.    [provider]  metoprolol  succinate (TOPROL -XL) 100 MG 24 hr tablet Take 100 mg by mouth in the morning. Take with or immediately following a meal.    [provider]  Oxycodone  HCl 10 MG TABS Take 1 tablet (10 mg total) by mouth every 4 (four) hours as needed for severe pain (pain score 7-10). 10/19/23   Johnanna Credit Caylin, PA-C  rosuvastatin  (CRESTOR ) 40 MG tablet Take 20 mg by mouth at bedtime. 07/01/23   [provider]  sacubitril -valsartan  (ENTRESTO ) 49-51 MG Take 1 tablet by mouth 2 (two) times daily.    [provider]  torsemide  (DEMADEX ) 20 MG tablet Take 20 mg by mouth in the morning. 11/23/22   [provider]  Scheduled Meds:  amiodarone   100  mg Oral Daily   capsaicin   1 Application Topical BID   Chlorhexidine  Gluconate Cloth  6 each Topical Daily   feeding supplement  1 Container Oral TID BM   Gerhardt's butt cream   Topical BID   insulin  aspart  0-20 Units Subcutaneous TID AC   levothyroxine   50 mcg Oral Q0600   lidocaine   1 patch Transdermal Q24H   melatonin  5 mg Oral QHS   midodrine   10 mg Oral TID WC    sodium chloride  flush  10-40 mL Intracatheter Q12H   thiamine  (VITAMIN B1) injection  100 mg Intravenous Q24H   Continuous Infusions:  heparin  2,550 Units/hr (10/30/23 1530)   norepinephrine  (LEVOPHED ) Adult infusion Stopped (10/30/23 0630)   PRN Meds:.acetaminophen , fentaNYL  (SUBLIMAZE ) injection, mouth rinse, polyethylene glycol, senna-docusate, sodium chloride  flush   Active Hospital Problem list   See systems below  Assessment & Plan:   #Ileus- RESOLVED Suspect post op related due to resent spinal surgery Repeat CT abd/pelvis- Dilated loops of bowel.  Patient had multiple BMs. -Surgery consult- Discussed case with Dr Desiderio unofficially does not appear to need surgical intervention at this moment.   -Hold narcotics -s/p [name] will try clear PO diet low dose , [name] has been DCD  #Sepic shock-  due to Suspected Intra-abdominal source-PRESENT ON ADMISSION -RESOLVED  -sacral wounds present on admission  -levophed  and vasopressin   -F/u cultures, trend lactic/ PCT -Monitor WBC/ fever curve -IV antibiotics: ceftriaxone  and Azithromycin >>ZOSYN  Iv completed 7d abx -IVF hydration as needed -Pressors for MAP goal >65 -Strict I/O's - CT lumbar spine no conerning findings  #AKI Likely ATN~improved  #Hyponatremia #NAGMA with Lactic Acidosis -Trend Lactate -Avoid nephrotoxic agents  -Follow BMP -Replace electrolytes as indicated -Nephrology consult   #Elevated LFTs -shock liver in the setting of severe sepsis with hypotension -Trend cmp  #Mildly Elevated Troponin, suspect demand ischemia #HFrEF  EF 35% s/p ICD placement  #Hypertension PMHx: CAD s/p [name] X4, PAF, HLD BNP mildly elevated without overt signs of volume overload or pulmonary edema -Trend HS Troponin until peaked -Hold torsemide , Entresto , metoprolol , Farxiga  and amiodarone   -Continue rosuvastatin  once able to tolerate p.o. -Hold Eliquis  for now -Obtain 2D echo  #T2 Diabetes mellitus  -Check hemoglobin  A1c -CBGs -Sliding scale insulin  -Follow ICU hyper/hypoglycemia protocol -Hold metformin  and glimepiride    #Chronic Pain #Hx of Chronic Bilateral Low Back Pain  #hx of severe lumbar stenosis from L3-L5 with right lower extremity radiculopathy s/p  L3-4, L4-5 laminotomy and foraminotomies on 10/19/2023  -Follows with Neurosugeon  -on Gabapentin  and Flexeril , and nerve block PRN  #Hypothyroidism -Check TSH and free T4 -Continue home Synthroid   Best practice:  Diet:  NPO Pain/Anxiety/Delirium protocol (if indicated): No VAP protocol (if indicated): Not indicated DVT prophylaxis: Subcutaneous Heparin  GI prophylaxis: PPI Glucose control:  SSI No Central venous access:  Yes, and it is still needed Arterial line:  Yes, and it is still needed Foley:  N/A Mobility:  bed rest  PT consulted: N/A Last date of multidisciplinary goals of care discussion [updated wife over the phone] Code Status:  full code Disposition: ICU   = Goals of Care = Code Status Order: FULL  Primary Emergency Contact: Nace,Katie V, Home Phone: [phone] Wishes to pursue full aggressive treatment and intervention options, including CPR and intubation, but goals of care will be addressed on going with family if that should become necessary.      Chun Sellen, M.D.  Pulmonary & Critical Care Medicine

## 2023-10-31 DIAGNOSIS — E782 Mixed hyperlipidemia: Secondary | ICD-10-CM

## 2023-10-31 DIAGNOSIS — N179 Acute kidney failure, unspecified: Secondary | ICD-10-CM

## 2023-10-31 DIAGNOSIS — D72829 Elevated white blood cell count, unspecified: Secondary | ICD-10-CM

## 2023-10-31 DIAGNOSIS — R6521 Severe sepsis with septic shock: Secondary | ICD-10-CM

## 2023-10-31 DIAGNOSIS — I4891 Unspecified atrial fibrillation: Secondary | ICD-10-CM | POA: Insufficient documentation

## 2023-10-31 DIAGNOSIS — A419 Sepsis, unspecified organism: Secondary | ICD-10-CM

## 2023-10-31 DIAGNOSIS — N1831 Chronic kidney disease, stage 3a: Secondary | ICD-10-CM

## 2023-10-31 DIAGNOSIS — L89152 Pressure ulcer of sacral region, stage 2: Secondary | ICD-10-CM | POA: Insufficient documentation

## 2023-10-31 DIAGNOSIS — K567 Ileus, unspecified: Secondary | ICD-10-CM | POA: Diagnosis not present

## 2023-10-31 DIAGNOSIS — I480 Paroxysmal atrial fibrillation: Secondary | ICD-10-CM | POA: Insufficient documentation

## 2023-10-31 DIAGNOSIS — R748 Abnormal levels of other serum enzymes: Secondary | ICD-10-CM | POA: Diagnosis not present

## 2023-10-31 DIAGNOSIS — N189 Chronic kidney disease, unspecified: Secondary | ICD-10-CM

## 2023-10-31 DIAGNOSIS — E162 Hypoglycemia, unspecified: Secondary | ICD-10-CM

## 2023-10-31 DIAGNOSIS — E669 Obesity, unspecified: Secondary | ICD-10-CM

## 2023-10-31 DIAGNOSIS — R6520 Severe sepsis without septic shock: Secondary | ICD-10-CM

## 2023-10-31 DIAGNOSIS — E1122 Type 2 diabetes mellitus with diabetic chronic kidney disease: Secondary | ICD-10-CM

## 2023-10-31 DIAGNOSIS — E039 Hypothyroidism, unspecified: Secondary | ICD-10-CM | POA: Insufficient documentation

## 2023-10-31 LAB — CBC WITH DIFFERENTIAL/PLATELET
Abs Immature Granulocytes: 0.39 10*3/uL — ABNORMAL HIGH (ref 0.00–0.07)
Basophils Absolute: 0 10*3/uL (ref 0.0–0.1)
Basophils Relative: 1 %
Eosinophils Absolute: 0.3 10*3/uL (ref 0.0–0.5)
Eosinophils Relative: 4 %
HCT: 32.2 % — ABNORMAL LOW (ref 39.0–52.0)
Hemoglobin: 11.1 g/dL — ABNORMAL LOW (ref 13.0–17.0)
Immature Granulocytes: 5 %
Lymphocytes Relative: 16 %
Lymphs Abs: 1.2 10*3/uL (ref 0.7–4.0)
MCH: 30.9 pg (ref 26.0–34.0)
MCHC: 34.5 g/dL (ref 30.0–36.0)
MCV: 89.7 fL (ref 80.0–100.0)
Monocytes Absolute: 0.7 10*3/uL (ref 0.1–1.0)
Monocytes Relative: 10 %
Neutro Abs: 4.8 10*3/uL (ref 1.7–7.7)
Neutrophils Relative %: 64 %
Platelets: 119 10*3/uL — ABNORMAL LOW (ref 150–400)
RBC: 3.59 MIL/uL — ABNORMAL LOW (ref 4.22–5.81)
RDW: 13.4 % (ref 11.5–15.5)
WBC: 7.4 10*3/uL (ref 4.0–10.5)
nRBC: 0 % (ref 0.0–0.2)

## 2023-10-31 LAB — BASIC METABOLIC PANEL
Anion gap: 10 (ref 5–15)
BUN: 62 mg/dL — ABNORMAL HIGH (ref 8–23)
CO2: 21 mmol/L — ABNORMAL LOW (ref 22–32)
Calcium: 8.5 mg/dL — ABNORMAL LOW (ref 8.9–10.3)
Chloride: 107 mmol/L (ref 98–111)
Creatinine, Ser: 2.46 mg/dL — ABNORMAL HIGH (ref 0.61–1.24)
GFR, Estimated: 27 mL/min — ABNORMAL LOW (ref >60)
Glucose, Bld: 161 mg/dL — ABNORMAL HIGH (ref 70–99)
Potassium: 3.6 mmol/L (ref 3.5–5.1)
Sodium: 138 mmol/L (ref 135–145)

## 2023-10-31 LAB — GLUCOSE, CAPILLARY
Glucose-Capillary: 135 mg/dL — ABNORMAL HIGH (ref 70–99)
Glucose-Capillary: 187 mg/dL — ABNORMAL HIGH (ref 70–99)
Glucose-Capillary: 172 mg/dL — ABNORMAL HIGH (ref 70–99)
Glucose-Capillary: 175 mg/dL — ABNORMAL HIGH (ref 70–99)

## 2023-10-31 LAB — PHOSPHORUS: Phosphorus: 3.5 mg/dL (ref 2.5–4.6)

## 2023-10-31 LAB — HEPARIN LEVEL (UNFRACTIONATED): Heparin Unfractionated: 0.54 [IU]/mL (ref 0.30–0.70)

## 2023-10-31 LAB — MAGNESIUM: Magnesium: 1.6 mg/dL — ABNORMAL LOW (ref 1.7–2.4)

## 2023-10-31 MED ORDER — APIXABAN 5 MG PO TABS
5.0000 mg | ORAL_TABLET | Freq: Two times a day (BID) | ORAL | Status: DC
  Administered 2023-10-31 – 2023-11-04 (×8): 5 mg via ORAL
  Filled 2023-10-31 (×8): qty 1

## 2023-10-31 MED ORDER — MAGNESIUM SULFATE 2 GM/50ML IV SOLN
2.0000 g | Freq: Once | INTRAVENOUS | Status: AC
  Administered 2023-10-31: 2 g via INTRAVENOUS
  Filled 2023-10-31: qty 50

## 2023-10-31 NOTE — Progress Notes (Signed)
 [facility]  PROGRESS NOTE   Subjective:   Feels much better today.  Foley removed.  Urine output more than 2.5 L.  Objective:  Vital signs: Blood pressure (!) 100/55, pulse 100, temperature (!) 97.5 F (36.4 C), temperature source Oral, resp. rate 19, height 6' 4 (1.93 m), weight 114.1 kg, SpO2 94%.  Intake/Output Summary (Last 24 hours) at 10/31/2023 1344 Last data filed at 10/31/2023 1300 Gross per 24 hour  Intake 1814.08 ml  Output 2550 ml  Net -735.92 ml   Filed Weights   10/29/23 0500 10/30/23 0400 10/31/23 0415  Weight: 113 kg 113.2 kg 114.1 kg     Physical Exam: General:  No acute distress  Head:  Normocephalic, atraumatic. Moist oral mucosal membranes  Eyes:  Anicteric  Neck:  Supple  Lungs:   Clear to auscultation, normal effort  Heart:  S1S2 no rubs  Abdomen:   Soft, nontender, bowel sounds present  Extremities:  peripheral edema.  Neurologic:  Awake, alert, following commands  Skin:  No lesions  Access:     Basic Metabolic Panel: Recent Labs  Lab 10/26/23 0419 10/27/23 0347 10/28/23 0510 10/29/23 0412 10/30/23 0407 10/31/23 0514  NA 133* 138 138 142 140 138  K 4.2 3.8 3.8 3.6 3.3* 3.6  CL 100 103 106 111 109 107  CO2 21* 23 22 21* 21* 21*  GLUCOSE 210* 151* 246* 177* 140* 161*  BUN 96* 103* 107* 93* 80* 62*  CREATININE 3.60* 4.26* 3.95* 3.44* 2.79* 2.46*  CALCIUM  8.1* 8.2* 8.1* 8.3* 8.3* 8.5*  MG 2.0 2.3 2.2 2.0  --  1.6*  PHOS 3.5 3.2 3.1 3.0  --  3.5   GFR: Estimated Creatinine Clearance: 37 mL/min (A) (by C-G formula based on SCr of 2.46 mg/dL (H)).  Liver Function Tests: Recent Labs  Lab 10/25/23 0205 10/26/23 0419 10/27/23 0347 10/28/23 0510 10/30/23 1216  AST 227* 131* 78* 40 23  ALT 147* 125* 95* 70* 36  ALKPHOS 43 50 45 46 38  BILITOT 0.8 1.0 0.8 0.8 0.5  PROT 5.3* 5.1* 4.8* 5.0* 5.0*  ALBUMIN  2.7* 2.4* 2.2* 2.1* 2.2*   Recent Labs  Lab 10/26/23 1237  LIPASE 19   No results for input(s): AMMONIA in the  last 168 hours.  CBC: Recent Labs  Lab 10/27/23 0347 10/28/23 0510 10/29/23 0412 10/30/23 0407 10/31/23 0514  WBC 7.1 8.3 9.5 9.6 7.4  NEUTROABS  --   --   --   --  4.8  HGB 11.7* 11.8* 11.3* 11.2* 11.1*  HCT 34.1* 34.6* 32.6* 33.6* 32.2*  MCV 90.5 92.0 89.3 91.1 89.7  PLT 129* 135* 129* 105* 119*     HbA1C: Hgb A1c MFr Bld  Date/Time Value Ref Range Status  10/13/2023 12:00 PM 5.8 (H) 4.8 - 5.6 % Final    Comment:    (NOTE) Pre diabetes:          5.7%-6.4%  Diabetes:              >6.4%  Glycemic control for   <7.0% adults with diabetes   04/13/2016 06:37 PM 7.6 (H) 4.8 - 5.6 % Final    Comment:    (NOTE)         Pre-diabetes: 5.7 - 6.4         Diabetes: >6.4         Glycemic control for adults with diabetes: <7.0     Urinalysis: No results for input(s): COLORURINE, LABSPEC, PHURINE, GLUCOSEU, HGBUR, BILIRUBINUR,  KETONESUR, PROTEINUR, UROBILINOGEN, NITRITE, LEUKOCYTESUR in the last 72 hours.  Invalid input(s): APPERANCEUR    Imaging: No results found.   Medications:    heparin  2,550 Units/hr (10/31/23 1132)    amiodarone   100 mg Oral Daily   capsaicin   1 Application Topical BID   Chlorhexidine  Gluconate Cloth  6 each Topical Daily   feeding supplement  1 Container Oral TID BM   Gerhardt's butt cream   Topical BID   insulin  aspart  0-20 Units Subcutaneous TID AC   insulin  aspart  0-5 Units Subcutaneous QHS   levothyroxine   50 mcg Oral Q0600   lidocaine   1 patch Transdermal Q24H   melatonin  5 mg Oral QHS   midodrine   10 mg Oral TID WC   sodium chloride  flush  10-40 mL Intracatheter Q12H    Assessment/ Plan:     73 y.o with significant PMH of lumbar spinal stenosis L3-4, L4-5 laminotomy/foraminotomies, T2DM, PAF, systolic CHF EF 35% s/p ICD placement, CAD status post [name], HTN, HLD who presented to the ED with chief complaints of generalized weakness with associated nausea and vomiting found to have ileus.   #1: Acute  kidney injury: Urine output has been improving.  Renal indices are slowly but steadily improving.   #2: Septic shock/hypotension: Blood pressure is much better.  Being weaned off of norepinephrine .  Completed IV antibiotics.   #3: Ileus: Now resolving.  Off of [name] at this time.  #4: Atrial fibrillation: Patient has been on IV heparin .  He is being started on Eliquis .  Labs and medications reviewed. Will continue to follow along with you.   LOS: 7 Pinkey Edman, MD [facility] 1/5/20251:44 PM

## 2023-10-31 NOTE — Assessment & Plan Note (Addendum)
 Patient on amiodarone and Eliquis.

## 2023-10-31 NOTE — Plan of Care (Signed)
   Problem: Fluid Volume: Goal: Ability to maintain a balanced intake and output will improve Outcome: Progressing

## 2023-10-31 NOTE — Assessment & Plan Note (Signed)
 Present on admission as per ICU specialist.  See full description below.

## 2023-10-31 NOTE — Assessment & Plan Note (Addendum)
 AKI on CKD stage IIIa.  Baseline creatinine around 1.28.  Last creatinine 1.85.  Creatinine peaked on 1/1 at 4.26.

## 2023-10-31 NOTE — Assessment & Plan Note (Addendum)
 BMI 33.49

## 2023-10-31 NOTE — Progress Notes (Signed)
 NAME:  Jason [name], MRN:  [id number], DOB:  [birth year], LOS: 7 ADMISSION DATE:  10/24/2023, CONSULTATION DATE:  10/25/23 REFERRING MD: Jawo, Modou NP CHIEF COMPLAINT: Generalized weakness   HPI  73 y.o with significant PMH of lumbar spinal stenosis L3-4, L4-5 laminotomy/foraminotomies, T2DM, PAF, systolic CHF EF 35% s/p ICD placement, CAD status post [name], HTN, HLD who presented to the ED with chief complaints of generalized weakness with associated nausea and vomiting since discharge from the hospital following admission for uncomplicated L3-4, L4-5 laminotomy and foraminotomies on 10/19/2023.   Past Medical History  Lumbar spinal stenosis L3-4, L4-5 laminotomy/foraminotomies, T2DM, PAF, systolic CHF EF 35% s/p ICD placement, CAD status post [name], HTN, HLD  Significant Hospital Events   12/29: Admitted to MedSurg unit with suspected postop ileus NG tube placed for decompression.  Overnight became hypoglycemic requiring dextrose  infusion 12/30: Rapid response called for unresponsiveness, hypoglycemia in the low 20s and hypertension (70/37).  Transferred to ICU PCCM consulted 10/26/23 -patient critically ill, on levophed , has not had nourishment in >1 wk.  Plan for PICC. Blood cultures pending, renal function improved but still severe AKI, repeat KUB today showed OGT got displaced to airway , this was replaced and repeated with repeat imaging showing good placement.  10/26/22- patient more oriented, continues to require levophed .  Sacral wound covered.  Levophed  and D5-1/2NS started.   10/27/22- patient had BM overnight, ilius with 6.1cm dilated loops of bowel on KUB this am with NG to suction still, abdomen distended.  [name] ongoing.  PICC line with [name] ongoing. TTE reassuring, remains on 2 vasopressors.  Discussed with dietary and will try trickle clears today due to clinically less abd tenderness.  10/29/23- patient had BM overnight with resolution of ilius, transitioning to PO liquid diet,  still is on levophed  5 + midodrine  10 TID, reducing IV amio and plan to transition to PO as able. 10/30/23- patient is optimized with TRH transfer today.   He is eating and is more lucid. His shock physiology has been improved quite a bit.  10/31/23-patient improved, now eating and speaking better.  Transitioned to TRH, pccm will sign off today.  Consults:  PCCM  Procedures:  12/30 right femoral art line 12/30 right femoral central line  Significant Diagnostic Tests:  12/29: Chest Xray> IMPRESSION: 1. Minor left basilar atelectasis. Otherwise no acute cardiopulmonary findings. 2. Gaseous distension of bowel within the included upper abdomen.  12/30: Noncontrast CT head> IMPRESSION: No acute intracranial abnormality.  12/30: CTA abdomen and pelvis> IMPRESSION: 1. Findings favoring ileus with persistent air and fluid distension of the small bowel, ascending, and transverse. There is no discrete transition point. Enteric tube decompresses the stomach with diminished gastric distension from yesterday. 2. No free air or mesenteric gas. 3. Progressive atelectasis in the medial left greater than right lower lobes. 4. Moderate-sized fat containing umbilical hernia.    Micro Data:  12/29: Blood culture x2> 12/30: MRSA PCR>>   Antimicrobials:  Azithromycin  12/29 Ceftriaxone  12/29  OBJECTIVE  Blood pressure 100/60, pulse 91, temperature (!) 97.5 F (36.4 C), temperature source Oral, resp. rate 17, height 6' 4 (1.93 m), weight 114.1 kg, SpO2 95%.       Intake/Output Summary (Last 24 hours) at 10/31/2023 1050 Last data filed at 10/31/2023 0926 Gross per 24 hour  Intake 1741.32 ml  Output 2525 ml  Net -783.68 ml   Filed Weights   10/29/23 0500 10/30/23 0400 10/31/23 0415  Weight: 113 kg 113.2  kg 114.1 kg    Physical Examination  GENERAL: 73 year-old  in no acute distress EYES: PEERLA. No scleral icterus. Extraocular muscles intact.  HEENT: Head atraumatic, normocephalic.  Oropharynx and nasopharynx clear.  NECK:  No JVD, supple  LUNGS: Normal breath sounds bilaterally.  No use of accessory muscles of respiration.  CARDIOVASCULAR: S1, S2 normal. No murmurs, rubs, or gallops.  ABDOMEN: Soft, NTND EXTREMITIES: trace swelling.  Capillary refill < 3 seconds in all extremities. Pulses palpable distally. NEUROLOGIC: The patient is  oriented x3 no FND SKIN: No obvious rash, lesion, or ulcer. Warm to touch Labs/imaging that I havepersonally reviewed  (right click and Reselect all SmartList Selections daily)     Labs   CBC: Recent Labs  Lab 10/27/23 0347 10/28/23 0510 10/29/23 0412 10/30/23 0407 10/31/23 0514  WBC 7.1 8.3 9.5 9.6 7.4  NEUTROABS  --   --   --   --  4.8  HGB 11.7* 11.8* 11.3* 11.2* 11.1*  HCT 34.1* 34.6* 32.6* 33.6* 32.2*  MCV 90.5 92.0 89.3 91.1 89.7  PLT 129* 135* 129* 105* 119*    Basic Metabolic Panel: Recent Labs  Lab 10/26/23 0419 10/27/23 0347 10/28/23 0510 10/29/23 0412 10/30/23 0407 10/31/23 0514  NA 133* 138 138 142 140 138  K 4.2 3.8 3.8 3.6 3.3* 3.6  CL 100 103 106 111 109 107  CO2 21* 23 22 21* 21* 21*  GLUCOSE 210* 151* 246* 177* 140* 161*  BUN 96* 103* 107* 93* 80* 62*  CREATININE 3.60* 4.26* 3.95* 3.44* 2.79* 2.46*  CALCIUM  8.1* 8.2* 8.1* 8.3* 8.3* 8.5*  MG 2.0 2.3 2.2 2.0  --  1.6*  PHOS 3.5 3.2 3.1 3.0  --  3.5   GFR: Estimated Creatinine Clearance: 37 mL/min (A) (by C-G formula based on SCr of 2.46 mg/dL (H)). Recent Labs  Lab 10/24/23 1314 10/24/23 1620 10/25/23 0205 10/25/23 0821 10/26/23 0419 10/26/23 1154 10/27/23 0347 10/27/23 1340 10/28/23 0510 10/29/23 0412 10/30/23 0407 10/31/23 0514  PROCALCITON 11.21  --  35.61  --  51.30  --   --   --   --   --   --   --   WBC  --    < > 6.4  --  7.2  --    < >  --  8.3 9.5 9.6 7.4  LATICACIDVEN 8.4*   < > 4.2* 3.1*  --  1.4  --  0.9  --   --   --   --    < > = values in this interval not displayed.    Liver Function Tests: Recent Labs  Lab  10/25/23 0205 10/26/23 0419 10/27/23 0347 10/28/23 0510 10/30/23 1216  AST 227* 131* 78* 40 23  ALT 147* 125* 95* 70* 36  ALKPHOS 43 50 45 46 38  BILITOT 0.8 1.0 0.8 0.8 0.5  PROT 5.3* 5.1* 4.8* 5.0* 5.0*  ALBUMIN  2.7* 2.4* 2.2* 2.1* 2.2*   Recent Labs  Lab 10/26/23 1237  LIPASE 19   No results for input(s): AMMONIA in the last 168 hours.  ABG    Component Value Date/Time   PHART 7.35 10/25/2023 0317   PCO2ART 27 (L) 10/25/2023 0317   PO2ART 75 (L) 10/25/2023 0317   HCO3 14.9 (L) 10/25/2023 0317   TCO2 26 04/15/2016 1707   ACIDBASEDEF 9.1 (H) 10/25/2023 0317   O2SAT 97.1 10/25/2023 0317     Coagulation Profile: Recent Labs  Lab 10/27/23 2134  INR 1.2  Cardiac Enzymes: No results for input(s): CKTOTAL, CKMB, CKMBINDEX, TROPONINI in the last 168 hours.  HbA1C: Hgb A1c MFr Bld  Date/Time Value Ref Range Status  10/13/2023 12:00 PM 5.8 (H) 4.8 - 5.6 % Final    Comment:    (NOTE) Pre diabetes:          5.7%-6.4%  Diabetes:              >6.4%  Glycemic control for   <7.0% adults with diabetes   04/13/2016 06:37 PM 7.6 (H) 4.8 - 5.6 % Final    Comment:    (NOTE)         Pre-diabetes: 5.7 - 6.4         Diabetes: >6.4         Glycemic control for adults with diabetes: <7.0     CBG: Recent Labs  Lab 10/30/23 0748 10/30/23 1141 10/30/23 1517 10/30/23 2129 10/31/23 0800  GLUCAP 117* 156* 194* 221* 135*    Review of Systems:   10 point ROS done and is negative except as per subjective findings  Past Medical History  He,  has a past medical history of AICD (automatic cardioverter/defibrillator) present, Arthritis, CHF (congestive heart failure) (HCC), Coronary artery disease involving native coronary artery with unstable angina pectoris (HCC) (04/13/2016), Coronary artery disease with history of myocardial infarction without history of [name], Cough, Diabetes mellitus without complication (HCC), Edema, Essential hypertension, Hypertension,  Hypothyroidism, Left main coronary artery disease (04/13/2016), Mixed hyperlipidemia, Obesity, PAF (paroxysmal atrial fibrillation) (HCC), S/P [name] (04/14/2016), Type II diabetes mellitus (HCC), and Unstable angina (HCC) (04/13/2016).   Surgical History    Past Surgical History:  Procedure Laterality Date   BACK SURGERY     Lumbar Surgeries. [place], [place]. Dr. Drury x2   CARDIAC CATHETERIZATION Left 04/13/2016   Procedure: Left Heart Cath and Coronary Angiography;  Surgeon: Wolm JINNY Rhyme, MD;  Location: [facility] INVASIVE CV [facility];  Service: Cardiovascular;  Laterality: Left;   CARDIOVERSION N/A 08/02/2018   Procedure: CARDIOVERSION;  Surgeon: Rhyme Wolm JINNY, MD;  Location: [facility] ORS;  Service: Cardiovascular;  Laterality: N/A;   CATARACT EXTRACTION W/PHACO Right 01/23/2016   Procedure: CATARACT EXTRACTION PHACO AND INTRAOCULAR LENS PLACEMENT (IOC);  Surgeon: Elsie Carmine, MD;  Location: [facility] ORS;  Service: Ophthalmology;  Laterality: Right;  US  01:18 AP% 23.8 CDE 18.67 fluid pack lot # [id number]   CHOLECYSTECTOMY     CORONARY ARTERY BYPASS GRAFT N/A 04/14/2016   Procedure: CORONARY ARTERY BYPASS GRAFTING TIMES FOUR    USING LEFT INTERNAL MAMMARY AND ENDOSCOPIC HARVEST RIGHT SAPHENOUS VEIN;  Surgeon: Sudie VEAR Laine, MD;  Location: [facility] OR;  Service: Open Heart Surgery;  Laterality: N/A;   EYE SURGERY Bilateral    Cataract Extraction with IOL implant.   INTRAOPERATIVE TRANSESOPHAGEAL ECHOCARDIOGRAM N/A 04/14/2016   Procedure: INTRAOPERATIVE TRANSESOPHAGEAL ECHOCARDIOGRAM;  Surgeon: Sudie VEAR Laine, MD;  Location: [facility] OR;  Service: Open Heart Surgery;  Laterality: N/A;   KNEE ARTHROSCOPY Left 10/08/2015   Procedure: ARTHROSCOPY KNEE, PARTIAL MEDIAL AND LATERAL MENISECTOMY, REMOVAL OF FOREIGN BODY;  Surgeon: Ozell Flake, MD;  Location: [facility] ORS;  Service: Orthopedics;  Laterality: Left;   LUMBAR LAMINECTOMY/DECOMPRESSION MICRODISCECTOMY Bilateral 10/18/2023   Procedure: Lumbar  Three-Four, Lumbar Four-Five  Laminotomy, Foraminotomy;  Surgeon: Mavis Purchase, MD;  Location: [facility] OR;  Service: Neurosurgery;  Laterality: Bilateral;  3C     Social History   reports that he has never smoked. His smokeless tobacco use includes chew. He reports that he  does not drink alcohol and does not use drugs.   Family History   His family history includes Aortic stenosis in his father; Heart disease in his father; Hypertension in his father.   Allergies Allergies  Allergen Reactions   Aspirin Anaphylaxis   Inderal [Propranolol]     Other reaction(s): Other (See Comments) Fatigue   Metformin  Hcl Diarrhea    Pt still takes medication   Mevacor [Lovastatin] Other (See Comments)    muscle Pain   Penicillin G Hives   Pravachol [Pravastatin Sodium] Other (See Comments)    Muscle Pain   Zocor [Simvastatin] Other (See Comments)    Muscle Pain   Penicillins Rash    Has patient had a PCN reaction causing immediate rash, facial/tongue/throat swelling, SOB or lightheadedness with hypotension: Yes Has patient had a PCN reaction causing severe rash involving mucus membranes or skin necrosis: Yes Has patient had a PCN reaction that required hospitalization No Has patient had a PCN reaction occurring within the last 10 years: Yes If all of the above answers are NO, then may proceed with Cephalosporin use.      Home Medications  Prior to Admission medications   Medication Sig Start Date End Date Taking? Authorizing Provider  acetaminophen  (TYLENOL ) 500 MG tablet Take 500-1,000 mg by mouth every 6 (six) hours as needed (pain.).    [provider]  amiodarone  (PACERONE ) 200 MG tablet Take 100 mg by mouth in the morning.    [provider]  apixaban  (ELIQUIS ) 5 MG TABS tablet Take 1 tablet (5 mg total) by mouth 2 (two) times daily. 10/25/23   Johnanna Credit Caylin, PA-C  cyclobenzaprine  (FLEXERIL ) 10 MG tablet Take 1 tablet (10 mg total) by mouth 3 (three) times  daily as needed for muscle spasms. 10/19/23   Tomlinson, Sara Caylin, PA-C  dapagliflozin  propanediol (FARXIGA ) 10 MG TABS tablet Take 10 mg by mouth in the morning.    [provider]  docusate sodium  (COLACE) 100 MG capsule Take 1 capsule (100 mg total) by mouth 2 (two) times daily. 10/19/23 10/18/24  Johnanna Credit Caylin, PA-C  gabapentin  (NEURONTIN ) 100 MG capsule Take 100 mg by mouth at bedtime as needed (nerve pain.). 06/30/23   [provider]  glimepiride  (AMARYL ) 4 MG tablet Take 4 mg by mouth 2 (two) times daily. Reported on 04/13/2016    [provider]  levothyroxine  (SYNTHROID ) 50 MCG tablet Take 50 mcg by mouth daily at 2 am. 08/08/23   [provider]  metFORMIN  (GLUCOPHAGE ) 1000 MG tablet Take 1,000 mg by mouth in the morning and at bedtime.    [provider]  metoprolol  succinate (TOPROL -XL) 100 MG 24 hr tablet Take 100 mg by mouth in the morning. Take with or immediately following a meal.    [provider]  Oxycodone  HCl 10 MG TABS Take 1 tablet (10 mg total) by mouth every 4 (four) hours as needed for severe pain (pain score 7-10). 10/19/23   Johnanna Credit Caylin, PA-C  rosuvastatin  (CRESTOR ) 40 MG tablet Take 20 mg by mouth at bedtime. 07/01/23   [provider]  sacubitril -valsartan  (ENTRESTO ) 49-51 MG Take 1 tablet by mouth 2 (two) times daily.    [provider]  torsemide  (DEMADEX ) 20 MG tablet Take 20 mg by mouth in the morning. 11/23/22   [provider]  Scheduled Meds:  amiodarone   100 mg Oral Daily   capsaicin   1 Application Topical BID   Chlorhexidine  Gluconate Cloth  6 each  Topical Daily   feeding supplement  1 Container Oral TID BM   Gerhardt's butt cream   Topical BID   insulin  aspart  0-20 Units Subcutaneous TID AC   insulin  aspart  0-5 Units Subcutaneous QHS   levothyroxine   50 mcg Oral Q0600   lidocaine   1 patch Transdermal Q24H   melatonin  5 mg Oral QHS   midodrine   10 mg Oral  TID WC   sodium chloride  flush  10-40 mL Intracatheter Q12H   Continuous Infusions:  heparin  2,550 Units/hr (10/31/23 0200)   PRN Meds:.acetaminophen , mouth rinse, senna-docusate, sodium chloride  flush   Active Hospital Problem list   See systems below  Assessment & Plan:   #Ileus- RESOLVED Suspect post op related due to resent spinal surgery Repeat CT abd/pelvis- Dilated loops of bowel.  Patient had multiple BMs. -Surgery consult- Discussed case with Dr Desiderio unofficially does not appear to need surgical intervention at this moment.   -Hold narcotics -s/p [name] will try clear PO diet low dose , [name] has been DCD  #Sepic shock-  due to Suspected Intra-abdominal source-PRESENT ON ADMISSION NOW -RESOLVED  -sacral wounds present on admission  -levophed  and vasopressin   -F/u cultures, trend lactic/ PCT -Monitor WBC/ fever curve -IV antibiotics: ceftriaxone  and Azithromycin >>ZOSYN  Iv completed 7d abx -IVF hydration as needed -Pressors for MAP goal >65 -Strict I/O's - CT lumbar spine no conerning findings  #AKI Likely ATN~improved  #Hyponatremia #NAGMA with Lactic Acidosis -Trend Lactate -Avoid nephrotoxic agents  -Follow BMP -Replace electrolytes as indicated -Nephrology consult   #Elevated LFTs -shock liver in the setting of severe sepsis with hypotension -Trend cmp  #Mildly Elevated Troponin, suspect demand ischemia #HFrEF  EF 35% s/p ICD placement  #Hypertension PMHx: CAD s/p [name] X4, PAF, HLD BNP mildly elevated without overt signs of volume overload or pulmonary edema -Trend HS Troponin until peaked -Hold torsemide , Entresto , metoprolol , Farxiga  and amiodarone   -Continue rosuvastatin  once able to tolerate p.o. -Hold Eliquis  for now -Obtain 2D echo  #T2 Diabetes mellitus  -Check hemoglobin A1c -CBGs -Sliding scale insulin  -Follow ICU hyper/hypoglycemia protocol -Hold metformin  and glimepiride    #Chronic Pain #Hx of Chronic Bilateral Low Back Pain  #hx  of severe lumbar stenosis from L3-L5 with right lower extremity radiculopathy s/p  L3-4, L4-5 laminotomy and foraminotomies on 10/19/2023  -Follows with Neurosugeon  -on Gabapentin  and Flexeril , and nerve block PRN  #Hypothyroidism -Check TSH and free T4 -Continue home Synthroid   Best practice:  Diet:  NPO Pain/Anxiety/Delirium protocol (if indicated): No VAP protocol (if indicated): Not indicated DVT prophylaxis: Subcutaneous Heparin  GI prophylaxis: PPI Glucose control:  SSI No Central venous access:  Yes, and it is still needed Arterial line:  Yes, and it is still needed Foley:  N/A Mobility:  bed rest  PT consulted: N/A Last date of multidisciplinary goals of care discussion [updated wife over the phone] Code Status:  full code Disposition: ICU   = Goals of Care = Code Status Order: FULL  Primary Emergency Contact: Salado,Katie V, Home Phone: [phone] Wishes to pursue full aggressive treatment and intervention options, including CPR and intubation, but goals of care will be addressed on going with family if that should become necessary.      Keon Benscoter, M.D.  Pulmonary & Critical Care Medicine

## 2023-10-31 NOTE — Progress Notes (Signed)
 ANTICOAGULATION CONSULT NOTE  Pharmacy Consult for Heparin  Infusion Indication: atrial fibrillation  Patient Measurements: Height: 6' 4 (193 cm) Weight: 114.1 kg (251 lb 8.7 oz) IBW/kg (Calculated) : 86.8 Heparin  Dosing Weight: 109.9 kg  Labs: Recent Labs    10/29/23 0412 10/29/23 0853 10/30/23 0407 10/30/23 1216 10/31/23 0514  HGB 11.3*  --  11.2*  --  11.1*  HCT 32.6*  --  33.6*  --  32.2*  PLT 129*  --  105*  --  119*  HEPARINUNFRC  --    < > 0.48 0.42 0.54  CREATININE 3.44*  --  2.79*  --  2.46*   < > = values in this interval not displayed.    Estimated Creatinine Clearance: 37 mL/min (A) (by C-G formula based on SCr of 2.46 mg/dL (H)).   Medical History: Past Medical History:  Diagnosis Date   AICD (automatic cardioverter/defibrillator) present    [employer]   Arthritis    CHF (congestive heart failure) (HCC)    Coronary artery disease involving native coronary artery with unstable angina pectoris (HCC) 04/13/2016   Coronary artery disease with history of myocardial infarction without history of [name]    Dr. Quin   Cough    CHRONIC   Diabetes mellitus without complication (HCC)    Edema    FEET/LEGS   Essential hypertension    Hypertension    Hypothyroidism    Left main coronary artery disease 04/13/2016   Mixed hyperlipidemia    Obesity    PAF (paroxysmal atrial fibrillation) (HCC)    S/P [name] 04/14/2016   LIMA to LAD, SVG to D1, SVG to OM, SVG to PDA, EVH via right thigh and leg   Type II diabetes mellitus (HCC)    Unstable angina (HCC) 04/13/2016   Assessment: Pt is a 73 yo male admitted on 10/25/23 with ileus, with h/o A fib on Eliquis  5 mg BID.  Eliquis  not resumed since admission, pt ordered heparin  5000 units sub-Q q8hr starting 12/29, last dose 10/26/22 @ 1327.  Goal of Therapy:  Heparin  level 0.3-0.7 units/ml Monitor platelets by anticoagulation protocol: Yes  1/3@0015  HL = 0.25 Subtherapeutic  1/3@0853  HL = 0.32 Therapeutic x  1 1/3@1727  HL = 0.28 Subtherapeutic  1/4@0407        HL = 0.48        Therapeutic X 1  1/4@1216  HL = 0.42 Therapeutic X 2 1/5@0514        HL = 0.54        Therapeutic X 3    Plan:  - Therapeutic X 3 - Will continue pt on current rate and recheck HL on 1/6 with AM labs.  - Daily CBC per protocol while on IV heparin   Kiyoshi Schaab D, PharmD 10/31/2023 5:45 AM

## 2023-10-31 NOTE — Assessment & Plan Note (Signed)
 Continue levothyroxine

## 2023-10-31 NOTE — Progress Notes (Signed)
 Progress Note   Patient: Jason [name] FMW:[id number] DOB: 1951-07-31 DOA: 10/24/2023     7 DOS: the patient was seen and examined on 10/31/2023   Brief hospital course: [name] is a 73 year old male with history of CAD status post four-vessel [name] in 2017, hypertension, hyperlipidemia, non-insulin -dependent diabetes mellitus, history of lumbar stenosis status post spinal stenosis surgery on 10/18/2023, who presents emergency department for chief concerns of generalized weakness since being discharged post spinal surgery, and intractable nausea and vomiting that started last evening.   12/29: Admitted to MedSurg unit with suspected postop ileus NG tube placed for decompression.  Overnight became hypoglycemic requiring dextrose  infusion 12/30: Rapid response called for unresponsiveness, hypoglycemia in the low 20s and hypertension (70/37).  Transferred to ICU PCCM consulted 10/26/23 -patient critically ill, on levophed , has not had nourishment in >1 wk.  Plan for PICC. Blood cultures pending, renal function improved but still severe AKI, repeat KUB today showed OGT got displaced to airway , this was replaced and repeated with repeat imaging showing good placement.  10/26/22- patient more oriented, continues to require levophed .  Sacral wound covered.  Levophed  and D5-1/2NS started.   10/27/22- patient had BM overnight, ilius with 6.1cm dilated loops of bowel on KUB this am with NG to suction still, abdomen distended.  [name] ongoing.  PICC line with [name] ongoing. TTE reassuring, remains on 2 vasopressors.  Discussed with dietary and will try trickle clears today due to clinically less abd tenderness.  10/29/23- patient had BM overnight with resolution of ilius, transitioning to PO liquid diet, still is on levophed  5 + midodrine  10 TID, reducing IV amio and plan to transition to PO as able.  1/5.  Transferred to medical service.  Advance to full liquid diet for breakfast and lunch.  Patient able to  advance to solid food later on.  Continue working with physical therapy.  Assessment and Plan: * Septic shock (HCC) Present on admission.  Lactic acid of 4.2 on presentation.  Required Levophed .  Now on midodrine .  Blood pressure stable.  Can transfer out of ICU.  Completed antibiotics.  Ileus (HCC) Likely secondary to pain medications after recent spinal surgery.  Patient has a Flexi-Seal in.  Advance to full liquid diet and hopefully solid food later today.  [name] discontinued previously.  Acute kidney injury superimposed on CKD (HCC) AKI on CKD stage IIIa.  Baseline creatinine around 1.28.  Today's creatinine 2.46.  Creatinine peaked on 1/1 at 4.26.  Elevated liver enzymes Normalized.  Likely secondary to sepsis.  Mixed hyperlipidemia Rosuvastatin  not resumed on admission due elevated liver enzymes  Hypoglycemia On presentation.  Leukocytosis Normalized  Type II diabetes mellitus (HCC) Hemoglobin A1c 5.8.  On sliding scale.  Hypothyroidism Continue levothyroxine   Paroxysmal atrial fibrillation (HCC) On heparin  drip.  Will convert over to Eliquis  this evening.  Patient on amiodarone   Sacral decubitus ulcer, stage II (HCC) Present on admission as per ICU specialist.  See full description below.  Spinal stenosis of lumbar region with neurogenic claudication Follow-up with neurosurgery as outpatient.  Continue PT and OT.  Obesity (BMI 30-39.9) BMI 30.62        Subjective: Patient states that his legs are very weak since the surgery.  Patient has a rectal tube.  No further nausea or vomiting.  Advancing to full liquid diet for breakfast and lunch.  Initially admitted with nausea vomiting and found to have an ileus.  Physical Exam: Vitals:   10/31/23 0500 10/31/23 0700 10/31/23 0800  10/31/23 0900  BP: (!) 111/53 99/82 (!) 113/48 100/60  Pulse: 92 88 83 91  Resp: (!) 21 (!) 21 20 17   Temp:      TempSrc:      SpO2: 92% 94% 92% 95%  Weight:      Height:        Physical Exam HENT:     Head: Normocephalic.  Eyes:     General: Lids are normal.     Conjunctiva/sclera: Conjunctivae normal.  Cardiovascular:     Rate and Rhythm: Normal rate and regular rhythm.     Heart sounds: Normal heart sounds, S1 normal and S2 normal.  Pulmonary:     Breath sounds: No decreased breath sounds, wheezing, rhonchi or rales.  Abdominal:     Palpations: Abdomen is soft.     Tenderness: There is no abdominal tenderness.  Musculoskeletal:     Right lower leg: Swelling present.     Left lower leg: Swelling present.  Skin:    General: Skin is warm.     Findings: No rash.  Neurological:     Mental Status: He is alert and oriented to person, place, and time.     Comments: Able to flex and extend bilateral ankles.  Able to flex at the knees bilaterally but unable to straight leg raise.     Data Reviewed: BUN 62 creatinine 2.46 with a GFR of 27, magnesium  1.6, white blood cell count 7.4, platelet count 119, hemoglobin 11.1.  Family Communication: Tried to reach wife on the phone.  Disposition: Status is: Inpatient Remains inpatient appropriate because: Transfer out of ICU to medical telemetry, advance to full liquid diet.  Planned Discharge Destination: Rehab    Time spent: 28 minutes  Author: Charlie Patterson, MD 10/31/2023 11:12 AM  For on call review [email or web address].

## 2023-10-31 NOTE — Assessment & Plan Note (Addendum)
 Present on admission.  Lactic acid of 4.2 on presentation.  Required Levophed during the hospital course.  Decreased dose of midodrine down to 5 mg 3 times daily.  Continue to taper midodrine if able.  Blood pressure stable.  Completed antibiotics.

## 2023-11-01 DIAGNOSIS — N179 Acute kidney failure, unspecified: Secondary | ICD-10-CM | POA: Diagnosis not present

## 2023-11-01 DIAGNOSIS — R748 Abnormal levels of other serum enzymes: Secondary | ICD-10-CM | POA: Diagnosis not present

## 2023-11-01 DIAGNOSIS — A419 Sepsis, unspecified organism: Secondary | ICD-10-CM | POA: Diagnosis not present

## 2023-11-01 DIAGNOSIS — K567 Ileus, unspecified: Secondary | ICD-10-CM | POA: Diagnosis not present

## 2023-11-01 LAB — GLUCOSE, CAPILLARY
Glucose-Capillary: 194 mg/dL — ABNORMAL HIGH (ref 70–99)
Glucose-Capillary: 192 mg/dL — ABNORMAL HIGH (ref 70–99)
Glucose-Capillary: 239 mg/dL — ABNORMAL HIGH (ref 70–99)
Glucose-Capillary: 194 mg/dL — ABNORMAL HIGH (ref 70–99)

## 2023-11-01 MED ORDER — ADULT MULTIVITAMIN W/MINERALS CH
1.0000 | ORAL_TABLET | Freq: Every day | ORAL | Status: DC
  Administered 2023-11-01 – 2023-11-04 (×4): 1 via ORAL
  Filled 2023-11-01 (×4): qty 1

## 2023-11-01 MED ORDER — MIDODRINE HCL 5 MG PO TABS
5.0000 mg | ORAL_TABLET | Freq: Three times a day (TID) | ORAL | Status: DC
  Administered 2023-11-01 – 2023-11-03 (×6): 5 mg via ORAL
  Filled 2023-11-01 (×6): qty 1

## 2023-11-01 MED ORDER — PNEUMOCOCCAL 20-VAL CONJ VACC 0.5 ML IM SUSY
0.5000 mL | PREFILLED_SYRINGE | INTRAMUSCULAR | Status: AC
  Administered 2023-11-02: 0.5 mL via INTRAMUSCULAR
  Filled 2023-11-01: qty 0.5

## 2023-11-01 NOTE — Plan of Care (Signed)

## 2023-11-01 NOTE — Progress Notes (Signed)
 Progress Note   Patient: Jason [name] FMW:[id number] DOB: July 05, 1951 DOA: 10/24/2023     8 DOS: the patient was seen and examined on 11/01/2023   Brief hospital course: [name] is a 73 year old male with history of CAD status post four-vessel [name] in 2017, hypertension, hyperlipidemia, non-insulin -dependent diabetes mellitus, history of lumbar stenosis status post spinal stenosis surgery on 10/18/2023, who presents emergency department for chief concerns of generalized weakness since being discharged post spinal surgery, and intractable nausea and vomiting that started last evening.   12/29: Admitted to MedSurg unit with suspected postop ileus NG tube placed for decompression.  Overnight became hypoglycemic requiring dextrose  infusion 12/30: Rapid response called for unresponsiveness, hypoglycemia in the low 20s and hypertension (70/37).  Transferred to ICU PCCM consulted 10/26/23 -patient critically ill, on levophed , has not had nourishment in >1 wk.  Plan for PICC. Blood cultures pending, renal function improved but still severe AKI, repeat KUB today showed OGT got displaced to airway , this was replaced and repeated with repeat imaging showing good placement.  10/26/22- patient more oriented, continues to require levophed .  Sacral wound covered.  Levophed  and D5-1/2NS started.   10/27/22- patient had BM overnight, ilius with 6.1cm dilated loops of bowel on KUB this am with NG to suction still, abdomen distended.  [name] ongoing.  PICC line with [name] ongoing. TTE reassuring, remains on 2 vasopressors.  Discussed with dietary and will try trickle clears today due to clinically less abd tenderness.  10/29/23- patient had BM overnight with resolution of ilius, transitioning to PO liquid diet, still is on levophed  5 + midodrine  10 TID, reducing IV amio and plan to transition to PO as able.  1/5.  Transferred to medical service.  Advance to full liquid diet for breakfast and lunch.  Patient able to  advance to solid food later on.  Continue working with physical therapy. 1/6.  Continue advance diet.  Patient still has a rectal tube.  This will need to come out prior to disposition to any rehab.  Assessment and Plan: * Septic shock (HCC) Present on admission.  Lactic acid of 4.2 on presentation.  Required Levophed  during the hospital course.  Decrease dose of midodrine  down to 5 mg 3 times daily.  Blood pressure stable.  Completed antibiotics.  Ileus (HCC) Likely secondary to pain medications after recent spinal surgery.  Patient has a rectal tube.  Advance to solid food.  [name] discontinued previously.  Acute kidney injury superimposed on CKD (HCC) AKI on CKD stage IIIa.  Baseline creatinine around 1.28.  Last creatinine 2.46.  Creatinine peaked on 1/1 at 4.26.  Elevated liver enzymes Normalized.  Likely secondary to sepsis.  Mixed hyperlipidemia Rosuvastatin  not resumed on admission due elevated liver enzymes  Hypoglycemia On presentation.  Leukocytosis Normalized  Type II diabetes mellitus (HCC) Hemoglobin A1c 5.8.  On sliding scale.  Hypomagnesemia Replaced yesterday.  Hypothyroidism Continue levothyroxine   Paroxysmal atrial fibrillation (HCC) Patient on amiodarone  and Eliquis .  Sacral decubitus ulcer, stage II (HCC) Present on admission as per ICU specialist.  See full description below.  Spinal stenosis of lumbar region with neurogenic claudication Follow-up with neurosurgery as outpatient.  Continue PT and OT.  Obesity (BMI 30-39.9) BMI 30.86        Subjective: Patient states his appetite is not that good but tolerated solid food.  Physical therapy recommending rehab.  Patient asking about immunizations.  He states he had the flu shot this year.  Asking about the RSV immunization which  we do not carry here he will have to get through his medical doctor.  Pharmacist ordered pneumonia vaccination.  Physical Exam: Vitals:   11/01/23 0000 11/01/23 0400  11/01/23 0800 11/01/23 0926  BP: (!) 138/54 (!) 129/56 (!) 119/59 131/64  Pulse: 84 87 96 91  Resp: (!) 21 (!) 23 (!) 24 20  Temp: 97.8 F (36.6 C) 97.7 F (36.5 C)  98.2 F (36.8 C)  TempSrc: Oral Oral  Oral  SpO2: 96% 94% 94% 98%  Weight:  115 kg    Height:       Physical Exam HENT:     Head: Normocephalic.  Eyes:     General: Lids are normal.     Conjunctiva/sclera: Conjunctivae normal.  Cardiovascular:     Rate and Rhythm: Normal rate and regular rhythm.     Heart sounds: Normal heart sounds, S1 normal and S2 normal.  Pulmonary:     Breath sounds: No decreased breath sounds, wheezing, rhonchi or rales.  Abdominal:     Palpations: Abdomen is soft.     Tenderness: There is no abdominal tenderness.  Musculoskeletal:     Right lower leg: Swelling present.     Left lower leg: Swelling present.  Skin:    General: Skin is warm.     Findings: No rash.  Neurological:     Mental Status: He is alert and oriented to person, place, and time.     Data Reviewed: Last 4 sugars 172, 175, 194 and 192.  Family Communication: Updated wife on the phone  Disposition: Status is: Inpatient Remains inpatient appropriate because: Rectal tube will have to come out prior to disposition to rehab  Planned Discharge Destination: Rehab    Time spent: 28 minutes  Author: Charlie Patterson, MD 11/01/2023 2:42 PM  For on call review [email or web address].

## 2023-11-01 NOTE — Progress Notes (Signed)
 Physical Therapy Treatment Patient Details Name: Jason [name] MRN: [id number] DOB: 08/28/1951 Today's Date: 11/01/2023   History of Present Illness [name] is a 73 year old male with history of CAD status post four-vessel [name] in 2017, hypertension, hyperlipidemia, non-insulin -dependent diabetes mellitus, history of lumbar stenosis status post spinal stenosis surgery on 10/18/2023, who presents emergency department for chief concerns of generalized weakness since being discharged post spinal surgery, and intractable nausea and vomiting. Patient found to have ileus.    PT Comments  Patient continues to require +2 person assistance for mobility. Two standing bouts performed with assistance from elevated bed height. Standing tolerance limited by generalized weakness and fatigue with minimal activity. Unable to progress walking this date. Recommend rehabilitation < 3 hours/day after this hospital stay.    If plan is discharge home, recommend the following: Two people to help with walking and/or transfers;A lot of help with bathing/dressing/bathroom   Can travel by private vehicle     No  Equipment Recommendations   (to be determined at next level of care)    Recommendations for Other Services       Precautions / Restrictions Precautions Precautions: Back Precaution Comments: recent lumbar surgery, no brace needed Restrictions Weight Bearing Restrictions Per Provider Order: No     Mobility  Bed Mobility Overal bed mobility: Needs Assistance Bed Mobility: Rolling, Sidelying to Sit, Sit to Sidelying Rolling: +2 for physical assistance, +2 for safety/equipment, Used rails, Mod assist, Max assist Sidelying to sit: Max assist, +2 for physical assistance, +2 for safety/equipment, HOB elevated, Used rails     Sit to sidelying: Max assist, +2 for physical assistance, +2 for safety/equipment, HOB elevated, Used rails General bed mobility comments: verbal cues for logroll technique  following recent back surgery    Transfers Overall transfer level: Needs assistance Equipment used: Rolling walker (2 wheels) Transfers: Sit to/from Stand Sit to Stand: Max assist, +2 physical assistance, Mod assist           General transfer comment: 2 bouts of standing performed. unable to achieve full standing with second attempt but able to clear buttocks from bed. bed height elevated. cues for hand placement for safety    Ambulation/Gait               General Gait Details: unable to at this time   Stairs             Wheelchair Mobility     Tilt Bed    Modified Rankin (Stroke Patients Only)       Balance Overall balance assessment: Needs assistance Sitting-balance support: Feet supported Sitting balance-Leahy Scale: Fair     Standing balance support: Bilateral upper extremity supported, During functional activity Standing balance-Leahy Scale: Poor Standing balance comment: external support required                            Cognition Arousal: Alert Behavior During Therapy: WFL for tasks assessed/performed Overall Cognitive Status: No family/caregiver present to determine baseline cognitive functioning Area of Impairment: Awareness, Problem solving, Following commands                       Following Commands: Follows one step commands with increased time   Awareness: Emergent Problem Solving: Slow processing, Requires verbal cues, Requires tactile cues          Exercises      General Comments General comments (skin integrity, edema, etc.): all lines/leads  intact pre/post session      Pertinent Vitals/Pain Pain Assessment Pain Assessment: Faces Faces Pain Scale: Hurts little more Pain Location: R knee Pain Descriptors / Indicators: Discomfort, Grimacing Pain Intervention(s): Limited activity within patient's tolerance, Monitored during session, Repositioned    Home Living                           Prior Function            PT Goals (current goals can now be found in the care plan section) Acute Rehab PT Goals Patient Stated Goal: to get stronger and walk PT Goal Formulation: With patient Time For Goal Achievement: 11/11/23 Potential to Achieve Goals: Fair Progress towards PT goals: Progressing toward goals    Frequency    Min 1X/week      PT Plan      Co-evaluation PT/OT/SLP Co-Evaluation/Treatment: Yes Reason for Co-Treatment: For patient/therapist safety;To address functional/ADL transfers PT goals addressed during session: Mobility/safety with mobility OT goals addressed during session: ADL's and self-care      AM-PAC PT 6 Clicks Mobility   Outcome Measure  Help needed turning from your back to your side while in a flat bed without using bedrails?: A Lot Help needed moving from lying on your back to sitting on the side of a flat bed without using bedrails?: A Lot Help needed moving to and from a bed to a chair (including a wheelchair)?: Total Help needed standing up from a chair using your arms (e.g., wheelchair or bedside chair)?: Total Help needed to walk in hospital room?: Total Help needed climbing 3-5 steps with a railing? : Total 6 Click Score: 8    End of Session   Activity Tolerance: Patient limited by fatigue;Patient tolerated treatment well Patient left: in bed;with call bell/phone within reach;with bed alarm set Nurse Communication: Mobility status PT Visit Diagnosis: Other abnormalities of gait and mobility (R26.89);Muscle weakness (generalized) (M62.81);Pain     Time: 8982-8950 PT Time Calculation (min) (ACUTE ONLY): 32 min  Charges:    $Therapeutic Activity: 8-22 mins PT General Charges $$ ACUTE PT VISIT: 1 Visit                     Jason [name], PT, MPT    Jason [name] 11/01/2023, 12:49 PM

## 2023-11-01 NOTE — Progress Notes (Signed)
 Occupational Therapy Treatment Patient Details Name: Jason [name] MRN: [id number] DOB: 19-Mar-1951 Today's Date: 11/01/2023   History of present illness Jason [name] is a 73 year old male with history of CAD status post four-vessel [name] in 2017, hypertension, hyperlipidemia, non-insulin -dependent diabetes mellitus, history of lumbar stenosis status post spinal stenosis surgery on 10/18/2023, who presents emergency department for chief concerns of generalized weakness since being discharged post spinal surgery, and intractable nausea and vomiting. Patient found to have ileus.   OT comments  Chart reviewed, pt greeted in bed, agreeable to OT tx session targeting improving functional activity tolerance. Co tx completed with PT on this date. Improvements noted in tolerance for mobility, pt requiring MAX A +2 for log roll to edge of bed, STS with MOD-MAX A +2 with RW. MIN A required for grooming tasks. Frequent vcs throughout for technique. Pt is making progress towards goals, discharge recommendation remains appropriate. OT will continue to follow.       If plan is discharge home, recommend the following:  A lot of help with bathing/dressing/bathroom;A lot of help with walking and/or transfers;Assistance with cooking/housework;Assist for transportation;Help with stairs or ramp for entrance   Equipment Recommendations  Other (comment) (defer to next venue of care)    Recommendations for Other Services      Precautions / Restrictions Precautions Precautions: Back Precaution Comments: recent lumbar surgery, no brace needed Restrictions Weight Bearing Restrictions Per Provider Order: No       Mobility Bed Mobility Overal bed mobility: Needs Assistance Bed Mobility: Rolling, Sidelying to Sit, Sit to Sidelying Rolling: +2 for physical assistance, +2 for safety/equipment, Used rails, Mod assist, Max assist Sidelying to sit: Max assist, +2 for physical assistance, +2 for safety/equipment, HOB  elevated, Used rails     Sit to sidelying: Max assist, +2 for physical assistance, +2 for safety/equipment, HOB elevated, Used rails General bed mobility comments: frequent vcs for technique    Transfers Overall transfer level: Needs assistance Equipment used: Rolling walker (2 wheels) Transfers: Sit to/from Stand Sit to Stand: Max assist, +2 physical assistance, Mod assist (two attempts)           General transfer comment: frequent vcs for technique, increased assist on second attempt     Balance Overall balance assessment: Needs assistance Sitting-balance support: Feet supported Sitting balance-Leahy Scale: Fair     Standing balance support: Bilateral upper extremity supported, During functional activity Standing balance-Leahy Scale: Poor                             ADL either performed or assessed with clinical judgement   ADL Overall ADL's : Needs assistance/impaired     Grooming: Wash/dry face;Sitting;Minimal assistance               Lower Body Dressing: Maximal assistance                      Extremity/Trunk Assessment              Vision       Perception     Praxis      Cognition Arousal: Alert Behavior During Therapy: WFL for tasks assessed/performed Overall Cognitive Status: No family/caregiver present to determine baseline cognitive functioning Area of Impairment: Awareness, Problem solving, Following commands                       Following Commands: Follows one step commands with  increased time   Awareness: Emergent Problem Solving: Slow processing, Requires verbal cues, Requires tactile cues          Exercises Other Exercises Other Exercises: edu re: role of OT, importance of continuing to progress mobility    Shoulder Instructions       General Comments all lines/leads intact pre/post session    Pertinent Vitals/ Pain       Pain Assessment Pain Assessment: Faces Faces Pain Scale: Hurts  little more Pain Location: Jason [name] Pain Descriptors / Indicators: Discomfort, Grimacing Pain Intervention(s): Monitored during session, Repositioned  Home Living                                          Prior Functioning/Environment              Frequency  Min 1X/week        Progress Toward Goals  OT Goals(current goals can now be found in the care plan section)  Progress towards OT goals: Progressing toward goals  Acute Rehab OT Goals Time For Goal Achievement: 11/11/23  Plan      Co-evaluation    PT/OT/SLP Co-Evaluation/Treatment: Yes Reason for Co-Treatment: For patient/therapist safety;To address functional/ADL transfers   OT goals addressed during session: ADL's and self-care      AM-PAC OT 6 Clicks Daily Activity     Outcome Measure   Help from another person eating meals?: A Little Help from another person taking care of personal grooming?: A Little Help from another person toileting, which includes using toliet, bedpan, or urinal?: A Lot Help from another person bathing (including washing, rinsing, drying)?: A Lot Help from another person to put on and taking off regular upper body clothing?: A Lot Help from another person to put on and taking off regular lower body clothing?: A Lot 6 Click Score: 14    End of Session Equipment Utilized During Treatment: Rolling walker (2 wheels)  OT Visit Diagnosis: Other abnormalities of gait and mobility (R26.89);Muscle weakness (generalized) (M62.81);Unsteadiness on feet (R26.81)   Activity Tolerance Patient tolerated treatment well   Patient Left in bed;with call bell/phone within reach;with bed alarm set (laying on L side to prevent further skin breakdown)   Nurse Communication Mobility status        Time: 1015-1050 OT Time Calculation (min): 35 min  Charges: OT General Charges $OT Visit: 1 Visit OT Treatments $Therapeutic Activity: 8-22 mins  Therisa Sheffield, OTD OTR/L  11/01/23,  12:18 PM

## 2023-11-01 NOTE — Plan of Care (Signed)
  Problem: Nutritional: Goal: Maintenance of adequate nutrition will improve Outcome: Progressing   Problem: Skin Integrity: Goal: Risk for impaired skin integrity will decrease Outcome: Progressing   Problem: Pain Management: Goal: General experience of comfort will improve Outcome: Progressing   Problem: Safety: Goal: Ability to remain free from injury will improve Outcome: Progressing

## 2023-11-01 NOTE — Progress Notes (Signed)
 [facility]  ROUNDING NOTE   Subjective:   01/05 0701 - 01/06 0700 In: 2158.3 [P.O.:1580; I.V.:528.3; IV Piggyback:50] Out: 2765 [Urine:2400; Stool:365] Lab Results  Component Value Date   CREATININE 2.46 (H) 10/31/2023   CREATININE 2.79 (H) 10/30/2023   CREATININE 3.44 (H) 10/29/2023   Patient seen resting in bed No family present Alert and oriented Continues to have poor oral intake States he is consuming fluids Room air No lower extremity edema  Creatinine 2.46 Urine output 2.4 L in preceding 24 hours  Objective:  Vital signs in last 24 hours:  Temp:  [97.4 F (36.3 C)-98.2 F (36.8 C)] 98.2 F (36.8 C) (01/06 0926) Pulse Rate:  [84-96] 91 (01/06 0926) Resp:  [18-24] 20 (01/06 0926) BP: (117-139)/(54-67) 131/64 (01/06 0926) SpO2:  [93 %-98 %] 98 % (01/06 0926) Weight:  [884 kg] 115 kg (01/06 0400)  Weight change: 0.9 kg Filed Weights   10/30/23 0400 10/31/23 0415 11/01/23 0400  Weight: 113.2 kg 114.1 kg 115 kg    Intake/Output: I/O last 3 completed shifts: In: 2816.2 [P.O.:2060; I.V.:706.2; IV Piggyback:50] Out: 3665 [Urine:2950; Stool:715]   Intake/Output this shift:  No intake/output data recorded.  Physical Exam: General: No acute distress  Head: Normocephalic, atraumatic. Moist oral mucosal membranes  Lungs:  Clear to auscultation, normal effort  Heart: S1S2 no rubs  Abdomen:  Soft, nontender, bowel sounds present  Extremities: No peripheral edema.  Neurologic: Awake, alert, following commands  Skin: No acute rash  Access: No hemodialysis access    Basic Metabolic Panel: Recent Labs  Lab 10/26/23 0419 10/27/23 0347 10/28/23 0510 10/29/23 0412 10/30/23 0407 10/31/23 0514  NA 133* 138 138 142 140 138  K 4.2 3.8 3.8 3.6 3.3* 3.6  CL 100 103 106 111 109 107  CO2 21* 23 22 21* 21* 21*  GLUCOSE 210* 151* 246* 177* 140* 161*  BUN 96* 103* 107* 93* 80* 62*  CREATININE 3.60* 4.26* 3.95* 3.44* 2.79* 2.46*  CALCIUM  8.1* 8.2*  8.1* 8.3* 8.3* 8.5*  MG 2.0 2.3 2.2 2.0  --  1.6*  PHOS 3.5 3.2 3.1 3.0  --  3.5    Liver Function Tests: Recent Labs  Lab 10/26/23 0419 10/27/23 0347 10/28/23 0510 10/30/23 1216  AST 131* 78* 40 23  ALT 125* 95* 70* 36  ALKPHOS 50 45 46 38  BILITOT 1.0 0.8 0.8 0.5  PROT 5.1* 4.8* 5.0* 5.0*  ALBUMIN  2.4* 2.2* 2.1* 2.2*   Recent Labs  Lab 10/26/23 1237  LIPASE 19   No results for input(s): AMMONIA in the last 168 hours.  CBC: Recent Labs  Lab 10/27/23 0347 10/28/23 0510 10/29/23 0412 10/30/23 0407 10/31/23 0514  WBC 7.1 8.3 9.5 9.6 7.4  NEUTROABS  --   --   --   --  4.8  HGB 11.7* 11.8* 11.3* 11.2* 11.1*  HCT 34.1* 34.6* 32.6* 33.6* 32.2*  MCV 90.5 92.0 89.3 91.1 89.7  PLT 129* 135* 129* 105* 119*    Cardiac Enzymes: No results for input(s): CKTOTAL, CKMB, CKMBINDEX, TROPONINI in the last 168 hours.  BNP: Invalid input(s): POCBNP  CBG: Recent Labs  Lab 10/31/23 1157 10/31/23 1619 10/31/23 2220 11/01/23 0828 11/01/23 1247  GLUCAP 187* 172* 175* 194* 192*    Microbiology: Results for orders placed or performed during the hospital encounter of 10/24/23  Blood culture (routine x 2)     Status: None   Collection Time: 10/24/23  1:14 PM   Specimen: BLOOD  Result Value Ref  Range Status   Specimen Description BLOOD BLOOD LEFT HAND  Final   Special Requests   Final    BOTTLES DRAWN AEROBIC AND ANAEROBIC Blood Culture adequate volume   Culture   Final    NO GROWTH 5 DAYS Performed at [facility], [address] [place], [place] [address]    Report Status 10/29/2023 FINAL  Final  Blood culture (routine x 2)     Status: None   Collection Time: 10/24/23  8:20 PM   Specimen: BLOOD LEFT HAND  Result Value Ref Range Status   Specimen Description BLOOD LEFT HAND  Final   Special Requests   Final    BOTTLES DRAWN AEROBIC AND ANAEROBIC Blood Culture results may not be optimal due to an inadequate volume of blood received in culture  bottles   Culture   Final    NO GROWTH 5 DAYS Performed at [facility], [address], [place], [place] [address]    Report Status 10/29/2023 FINAL  Final  MRSA Next Gen by PCR, Nasal     Status: None   Collection Time: 10/25/23  2:05 AM   Specimen: Nasal Mucosa; Nasal Swab  Result Value Ref Range Status   MRSA by PCR Next Gen NOT DETECTED NOT DETECTED Final    Comment: (NOTE) The GeneXpert MRSA Assay (FDA approved for NASAL specimens only), is one component of a comprehensive MRSA colonization surveillance program. It is not intended to diagnose MRSA infection nor to guide or monitor treatment for MRSA infections. Test performance is not FDA approved in patients less than 49 years old. Performed at [facility], [address] [place], [place] [address]   SARS Coronavirus 2 by RT PCR (hospital order, performed in [facility] hospital lab) *cepheid single result test* Anterior Nasal Swab     Status: None   Collection Time: 10/25/23  8:37 AM   Specimen: Anterior Nasal Swab  Result Value Ref Range Status   SARS Coronavirus 2 by RT PCR NEGATIVE NEGATIVE Final    Comment: (NOTE) SARS-CoV-2 target nucleic acids are NOT DETECTED.  The SARS-CoV-2 RNA is generally detectable in upper and lower respiratory specimens during the acute phase of infection. The lowest concentration of SARS-CoV-2 viral copies this assay can detect is 250 copies / mL. A negative result does not preclude SARS-CoV-2 infection and should not be used as the sole basis for treatment or other patient management decisions.  A negative result may occur with improper specimen collection / handling, submission of specimen other than nasopharyngeal swab, presence of viral mutation(s) within the areas targeted by this assay, and inadequate number of viral copies (<250 copies / mL). A negative result must be combined with clinical observations, patient history, and epidemiological information.  Fact  Sheet for Patients:   roadlaptop.co.za  Fact Sheet for Healthcare Providers: http://kim-miller.com/  This test is not yet approved or  cleared by the [place]  FDA and has been authorized for detection and/or diagnosis of SARS-CoV-2 by FDA under an Emergency Use Authorization (EUA).  This EUA will remain in effect (meaning this test can be used) for the duration of the COVID-19 declaration under Section 564(b)(1) of the Act, 21 U.S.C. section 360bbb-3(b)(1), unless the authorization is terminated or revoked sooner.  Performed at [facility], [address] [place], [place] [address]   Respiratory (~20 pathogens) panel by PCR     Status: None   Collection Time: 10/25/23  8:37 AM   Specimen: Nasopharyngeal Swab; Respiratory  Result Value Ref Range Status   Adenovirus NOT DETECTED NOT DETECTED Final   Coronavirus 229E NOT DETECTED NOT DETECTED Final    Comment: (NOTE) The Coronavirus on the Respiratory Panel, DOES NOT test for the novel  Coronavirus (2019 nCoV)    Coronavirus HKU1 NOT DETECTED NOT DETECTED Final   Coronavirus NL63 NOT DETECTED NOT DETECTED Final   Coronavirus OC43 NOT DETECTED NOT DETECTED Final   Metapneumovirus NOT DETECTED NOT DETECTED Final   Rhinovirus / Enterovirus NOT DETECTED NOT DETECTED Final   Influenza A NOT DETECTED NOT DETECTED Final   Influenza B NOT DETECTED NOT DETECTED Final   Parainfluenza Virus 1 NOT DETECTED NOT DETECTED Final   Parainfluenza Virus 2 NOT DETECTED NOT DETECTED Final   Parainfluenza Virus 3 NOT DETECTED NOT DETECTED Final   Parainfluenza Virus 4 NOT DETECTED NOT DETECTED Final   Respiratory Syncytial Virus NOT DETECTED NOT DETECTED Final   Bordetella pertussis NOT DETECTED NOT DETECTED Final   Bordetella Parapertussis NOT DETECTED NOT DETECTED Final   Chlamydophila pneumoniae NOT DETECTED NOT DETECTED Final   Mycoplasma pneumoniae NOT DETECTED NOT DETECTED Final     Comment: Performed at [facility], [address] [place], [place] [address]    Coagulation Studies: No results for input(s): LABPROT, INR in the last 72 hours.   Urinalysis: No results for input(s): COLORURINE, LABSPEC, PHURINE, GLUCOSEU, HGBUR, BILIRUBINUR, KETONESUR, PROTEINUR, UROBILINOGEN, NITRITE, LEUKOCYTESUR in the last 72 hours.  Invalid input(s): APPERANCEUR    Imaging: No results found.    Medications:      amiodarone   100 mg Oral Daily   apixaban   5 mg Oral BID   capsaicin   1 Application Topical BID   Chlorhexidine  Gluconate Cloth  6 each Topical Daily   Gerhardt's butt cream   Topical BID   insulin  aspart  0-20 Units Subcutaneous TID AC   insulin  aspart  0-5 Units Subcutaneous QHS   levothyroxine   50 mcg Oral Q0600   lidocaine   1 patch Transdermal Q24H   melatonin  5 mg Oral QHS   midodrine   5 mg Oral TID WC   multivitamin with minerals  1 tablet Oral Q lunch   [START ON 11/02/2023] pneumococcal 20-valent conjugate vaccine  0.5 mL Intramuscular Tomorrow-1000   sodium chloride  flush  10-40 mL Intracatheter Q12H   acetaminophen , mouth rinse, senna-docusate, sodium chloride  flush  Assessment/ Plan:  73 y.o. male  with coronary artery disease status post [name], hypertension, hyperlipidemia, diabetes mellitus type II noninsulin dependent, who was admitted to [facility] on 10/24/2023 for Ileus [facility]) [K56.7] Status post lumbar stenosis surgery on 10/18/23.   Acute Kidney Injury/chronic kidney disease stage IIIa: baseline creatinine of 1.28, GFR of 59.  Suspect acute kidney injury due to ATN most likely.  Entresto , torsemide , metformin , Farxiga  all on hold.   Update: Creatinine continues to slowly improve.  Patient encouraged to maintain oral intake.  No acute indication for dialysis.  Patient will need to follow-up with nephrology at discharge.  Hypotension.  Patient remains on amiodarone  and midodrine .     LOS: 8 Tongela Encinas 1/6/20253:37 PM

## 2023-11-01 NOTE — Progress Notes (Signed)
 Nutrition Follow-up  DOCUMENTATION CODES:   Not applicable  INTERVENTION:   -Liberalize diet to carb modified for wider variety of meal selections -MVI with minerals daily -Double protein portions with meals -Magic cup TID with meals, each supplement provides 290 kcal and 9 grams of protein   NUTRITION DIAGNOSIS:   Inadequate oral intake related to acute illness as evidenced by per patient/family report.  Ongoing  GOAL:   Patient will meet greater than or equal to 90% of their needs  Progressing   MONITOR:   Diet advancement, Labs, Weight trends, I & O's, Skin  REASON FOR ASSESSMENT:   Malnutrition Screening Tool    ASSESSMENT:   73 y/o male with h/o CAD s/p [name], DM, HTN, PAF, CHF, GERD and spinal stenosis s/p bilateral L3-4 and L4-5 laminotomy/foraminotomies 12/23 who is admitted with sepsis, AKI and suspected ileus.  1/2- [name] d/c, advanced to clear liquid diet 1/5- advanced to full liquid diet, advanced to heart healthy, carb modified diet  Reviewed I/O's: -607 ml x 24 hours and +4.1 L since admission  UOP: 2.4 L x 24 hours  Rectal tube output: 365 ml x 24 hours   Per nephrology notes, renal function steadily improving. No need for HD at this time.   Pt sitting up in bed at time of visit, preparing to eat lunch. Pt not very interactive with this RD. He is currently on a heart healthy, carb modified diet. Noted good appetite, meal completions 75-100%. He is drinking Boost Breeze supplements.   Wt has been stable since admission.   Labs reviewed: Mg: 1.6, CBGS: 175-221 (inpatient orders for glycemic control are 0-20 units insulin  aspart TID before meals and 0-5 units insulin  aspart daily at bedtime).    Diet Order:   Diet Order             Diet heart healthy/carb modified Fluid consistency: Thin  Diet effective 1400                   EDUCATION NEEDS:   No education needs have been identified at this time  Skin:  Skin Assessment: Skin  Integrity Issues: Skin Integrity Issues:: Incisions, Other (Comment) Incisions: closed mid vertebal column Other: small red open areas on buttocks  Last BM:  11/01/23 (325 ml via rectal tube)  Height:   Ht Readings from Last 1 Encounters:  10/24/23 6' 4 (1.93 m)    Weight:   Wt Readings from Last 1 Encounters:  11/01/23 115 kg    Ideal Body Weight:  91.8 kg  BMI:  Body mass index is 30.86 kg/m.  Estimated Nutritional Needs:   Kcal:  2300-2500  Protein:  120-135 grams  Fluid:  > 2 L    Margery ORN, RD, LDN, CDCES Registered Dietitian III Certified Diabetes Care and Education Specialist If unable to reach this RD, please use RD Inpatient group chat on secure chat between hours of 8am-4 pm daily

## 2023-11-01 NOTE — Assessment & Plan Note (Addendum)
 Replaced

## 2023-11-02 DIAGNOSIS — A419 Sepsis, unspecified organism: Secondary | ICD-10-CM | POA: Diagnosis not present

## 2023-11-02 DIAGNOSIS — N179 Acute kidney failure, unspecified: Secondary | ICD-10-CM | POA: Diagnosis not present

## 2023-11-02 DIAGNOSIS — E876 Hypokalemia: Secondary | ICD-10-CM | POA: Insufficient documentation

## 2023-11-02 DIAGNOSIS — I25110 Atherosclerotic heart disease of native coronary artery with unstable angina pectoris: Secondary | ICD-10-CM

## 2023-11-02 DIAGNOSIS — R748 Abnormal levels of other serum enzymes: Secondary | ICD-10-CM | POA: Diagnosis not present

## 2023-11-02 DIAGNOSIS — K567 Ileus, unspecified: Secondary | ICD-10-CM | POA: Diagnosis not present

## 2023-11-02 LAB — GLUCOSE, CAPILLARY
Glucose-Capillary: 149 mg/dL — ABNORMAL HIGH (ref 70–99)
Glucose-Capillary: 190 mg/dL — ABNORMAL HIGH (ref 70–99)
Glucose-Capillary: 201 mg/dL — ABNORMAL HIGH (ref 70–99)
Glucose-Capillary: >600 mg/dL (ref 70–99)
Glucose-Capillary: 166 mg/dL — ABNORMAL HIGH (ref 70–99)

## 2023-11-02 LAB — BASIC METABOLIC PANEL
Anion gap: 7 (ref 5–15)
BUN: 36 mg/dL — ABNORMAL HIGH (ref 8–23)
CO2: 24 mmol/L (ref 22–32)
Calcium: 8.9 mg/dL (ref 8.9–10.3)
Chloride: 106 mmol/L (ref 98–111)
Creatinine, Ser: 1.85 mg/dL — ABNORMAL HIGH (ref 0.61–1.24)
GFR, Estimated: 38 mL/min — ABNORMAL LOW (ref >60)
Glucose, Bld: 174 mg/dL — ABNORMAL HIGH (ref 70–99)
Potassium: 3.4 mmol/L — ABNORMAL LOW (ref 3.5–5.1)
Sodium: 137 mmol/L (ref 135–145)

## 2023-11-02 LAB — CBC
HCT: 30.5 % — ABNORMAL LOW (ref 39.0–52.0)
Hemoglobin: 10.2 g/dL — ABNORMAL LOW (ref 13.0–17.0)
MCH: 30.8 pg (ref 26.0–34.0)
MCHC: 33.4 g/dL (ref 30.0–36.0)
MCV: 92.1 fL (ref 80.0–100.0)
Platelets: 141 10*3/uL — ABNORMAL LOW (ref 150–400)
RBC: 3.31 MIL/uL — ABNORMAL LOW (ref 4.22–5.81)
RDW: 13 % (ref 11.5–15.5)
WBC: 7.2 10*3/uL (ref 4.0–10.5)
nRBC: 0 % (ref 0.0–0.2)

## 2023-11-02 LAB — PHOSPHORUS: Phosphorus: 3.1 mg/dL (ref 2.5–4.6)

## 2023-11-02 LAB — MAGNESIUM: Magnesium: 1.5 mg/dL — ABNORMAL LOW (ref 1.7–2.4)

## 2023-11-02 MED ORDER — POTASSIUM CHLORIDE CRYS ER 20 MEQ PO TBCR
40.0000 meq | EXTENDED_RELEASE_TABLET | Freq: Once | ORAL | Status: AC
  Administered 2023-11-02: 40 meq via ORAL
  Filled 2023-11-02: qty 2

## 2023-11-02 MED ORDER — MAGNESIUM SULFATE 2 GM/50ML IV SOLN
2.0000 g | Freq: Once | INTRAVENOUS | Status: AC
  Administered 2023-11-02: 2 g via INTRAVENOUS
  Filled 2023-11-02: qty 50

## 2023-11-02 MED ORDER — PANTOPRAZOLE SODIUM 40 MG PO TBEC
40.0000 mg | DELAYED_RELEASE_TABLET | Freq: Every day | ORAL | Status: DC
  Administered 2023-11-02 – 2023-11-04 (×3): 40 mg via ORAL
  Filled 2023-11-02 (×3): qty 1

## 2023-11-02 NOTE — Progress Notes (Signed)
 Occupational Therapy Treatment Patient Details Name: Jason [name] MRN: [id number] DOB: 1951-10-06 Today's Date: 11/02/2023   History of present illness Mr. Kolten Ryback is a 73 year old male with history of CAD status post four-vessel [name] in 2017, hypertension, hyperlipidemia, non-insulin -dependent diabetes mellitus, history of lumbar stenosis status post spinal stenosis surgery on 10/18/2023, who presents emergency department for chief concerns of generalized weakness since being discharged post spinal surgery, and intractable nausea and vomiting. Patient found to have ileus.   OT comments  Chart reviewed to date, pt in bed, agreeable to OT tx session with co tx with PT. Continued increased time for processing but improved overall on thsi date. Tx session targeted improving functional mobility to facilitate improve ADL performance. STS with MOD-MAX A in sara stedy +2 and transfer to bedside chair. MIN A for grooming tasks. Pt left in care of PT in chair. Pt is making progress towards goals, discharge remains appropriate. OT will continue to follow acutely.       If plan is discharge home, recommend the following:  A lot of help with bathing/dressing/bathroom;A lot of help with walking and/or transfers;Assistance with cooking/housework;Assist for transportation;Help with stairs or ramp for entrance   Equipment Recommendations  Other (comment) (defer)    Recommendations for Other Services      Precautions / Restrictions Precautions Precautions: Back Precaution Comments: recent lumbar surgery, no brace needed Restrictions Weight Bearing Restrictions Per Provider Order: No       Mobility Bed Mobility Overal bed mobility: Needs Assistance Bed Mobility: Rolling, Sidelying to Sit, Sit to Sidelying Rolling: +2 for physical assistance, +2 for safety/equipment, Used rails, Mod assist, Max assist Sidelying to sit: Max assist, +2 for physical assistance, +2 for safety/equipment, HOB elevated,  Used rails     Sit to sidelying: Max assist, +2 for physical assistance, +2 for safety/equipment, HOB elevated, Used rails General bed mobility comments: frequent vcs for technique    Transfers Overall transfer level: Needs assistance   Transfers: Sit to/from Stand, Bed to chair/wheelchair/BSC Sit to Stand: Mod assist, Max assist, Via financial trader (in [place])             Mudlogger: [employer] Overall balance assessment: Needs assistance Sitting-balance support: Feet supported Sitting balance-Leahy Scale: Good     Standing balance support: Bilateral upper extremity supported Standing balance-Leahy Scale: Poor                             ADL either performed or assessed with clinical judgement   ADL Overall ADL's : Needs assistance/impaired     Grooming: Wash/dry face;Sitting;Minimal assistance               Lower Body Dressing: Maximal assistance Lower Body Dressing Details (indicate cue type and reason): donn/doff socks Toilet Transfer: +2 for physical assistance;+2 for safety/equipment;Maximal assistance;Total assistance Toilet Transfer Details (indicate cue type and reason): use of sara stedy to bedside chair                Extremity/Trunk Assessment              Vision       Perception     Praxis      Cognition   Behavior During Therapy: [facility] for tasks assessed/performed   Area of Impairment: Awareness, Problem solving, Following commands  Following Commands: Follows one step commands with increased time     Problem Solving: Slow processing, Requires verbal cues, Requires tactile cues          Exercises Other Exercises Other Exercises: edu re role of OT, role of rehab,    Shoulder Instructions       General Comments patient pleased to be sitting up in the chair at end of session    Pertinent Vitals/ Pain       Pain Assessment Pain Assessment: 0-10 Pain  Score: 8  Pain Location: R knee Pain Descriptors / Indicators: Discomfort, Grimacing Pain Intervention(s): Monitored during session, Limited activity within patient's tolerance  Home Living                                          Prior Functioning/Environment              Frequency  Min 1X/week        Progress Toward Goals  OT Goals(current goals can now be found in the care plan section)  Progress towards OT goals: Progressing toward goals  Acute Rehab OT Goals Time For Goal Achievement: 11/11/23  Plan      Co-evaluation    PT/OT/SLP Co-Evaluation/Treatment: Yes Reason for Co-Treatment: For patient/therapist safety;To address functional/ADL transfers PT goals addressed during session: Mobility/safety with mobility OT goals addressed during session: ADL's and self-care      AM-PAC OT 6 Clicks Daily Activity     Outcome Measure   Help from another person eating meals?: A Little Help from another person taking care of personal grooming?: A Little Help from another person toileting, which includes using toliet, bedpan, or urinal?: A Lot Help from another person bathing (including washing, rinsing, drying)?: A Lot Help from another person to put on and taking off regular upper body clothing?: A Lot Help from another person to put on and taking off regular lower body clothing?: A Lot 6 Click Score: 14    End of Session Equipment Utilized During Treatment: Other (comment) (stedy)  OT Visit Diagnosis: Other abnormalities of gait and mobility (R26.89);Muscle weakness (generalized) (M62.81);Unsteadiness on feet (R26.81)   Activity Tolerance Patient tolerated treatment well   Patient Left in chair;with call bell/phone within reach   Nurse Communication Mobility status;Other (comment) (pain in R knee)        Time: 8592-8565 OT Time Calculation (min): 27 min  Charges: OT General Charges $OT Visit: 1 Visit OT Treatments $Therapeutic  Activity: 8-22 mins  Therisa Sheffield, OTD OTR/L  11/02/23, 2:52 PM

## 2023-11-02 NOTE — Care Management Important Message (Signed)
 Important Message  Patient Details  Name: Jason [name] MRN: [id number] Date of Birth: [birth year]   Important Message Given:  Yes - Medicare IM     Sherilyn Banker 11/02/2023, 2:52 PM

## 2023-11-02 NOTE — Progress Notes (Signed)
 Progress Note   Patient: Jason [name] FMW:[id number] DOB: [birth year] DOA: 10/24/2023     9 DOS: the patient was seen and examined on 11/02/2023   Brief hospital course: Jason [name] is a 73 year old male with history of CAD status post four-vessel [name] in 2017, hypertension, hyperlipidemia, non-insulin -dependent diabetes mellitus, history of lumbar stenosis status post spinal stenosis surgery on 10/18/2023, who presents emergency department for chief concerns of generalized weakness since being discharged post spinal surgery, and intractable nausea and vomiting that started last evening.   12/29: Admitted to MedSurg unit with suspected postop ileus NG tube placed for decompression.  Overnight became hypoglycemic requiring dextrose  infusion 12/30: Rapid response called for unresponsiveness, hypoglycemia in the low 20s and hypertension (70/37).  Transferred to ICU PCCM consulted 10/26/23 -patient critically ill, on levophed , has not had nourishment in >1 wk.  Plan for PICC. Blood cultures pending, renal function improved but still severe AKI, repeat KUB today showed OGT got displaced to airway , this was replaced and repeated with repeat imaging showing good placement.  10/26/22- patient more oriented, continues to require levophed .  Sacral wound covered.  Levophed  and D5-1/2NS started.   10/27/22- patient had BM overnight, ilius with 6.1cm dilated loops of bowel on KUB this am with NG to suction still, abdomen distended.  [name] ongoing.  PICC line with [name] ongoing. TTE reassuring, remains on 2 vasopressors.  Discussed with dietary and will try trickle clears today due to clinically less abd tenderness.  10/29/23- patient had BM overnight with resolution of ilius, transitioning to PO liquid diet, still is on levophed  5 + midodrine  10 TID, reducing IV amio and plan to transition to PO as able.  1/5.  Transferred to medical service.  Advance to full liquid diet for breakfast and lunch.  Patient able to  advance to solid food later on.  Continue working with physical therapy. 1/6.  Continue advance diet.  Patient still has a rectal tube.  This will need to come out prior to disposition to any rehab. 1/7.  Creatinine down to 1.85, hemoglobin 10.2.  Patient still has rectal tube.  Assessment and Plan: * Septic shock (HCC) Present on admission.  Lactic acid of 4.2 on presentation.  Required Levophed  during the hospital course.  Decreased dose of midodrine  down to 5 mg 3 times daily.  Continue to taper midodrine  if able.  Blood pressure stable.  Completed antibiotics.  Ileus (HCC) Likely secondary to pain medications after recent spinal surgery.  Patient has a rectal tube.  Advanced to solid food.  [name] discontinued previously.  Start Protonix  since patient complains of some abdominal discomfort.  Acute kidney injury superimposed on CKD (HCC) AKI on CKD stage IIIa.  Baseline creatinine around 1.28.  Last creatinine 1.85.  Creatinine peaked on 1/1 at 4.26.  Elevated liver enzymes Normalized.  Likely secondary to sepsis.  Mixed hyperlipidemia Rosuvastatin  not resumed on admission due elevated liver enzymes  Hypoglycemia On presentation.  Leukocytosis Normalized  Type II diabetes mellitus (HCC) Hemoglobin A1c 5.8.  On sliding scale.  Hypokalemia Oral replacement  Hypomagnesemia Replace IV today  Hypothyroidism Continue levothyroxine   Paroxysmal atrial fibrillation (HCC) Patient on amiodarone  and Eliquis .  Sacral decubitus ulcer, stage II (HCC) Present on admission as per ICU specialist.  See full description below.  Spinal stenosis of lumbar region with neurogenic claudication Follow-up with neurosurgery as outpatient.  Continue PT and OT.  CT lumbar spine showed interval posterior decompression of the left L3-4 and L4-5 with improved patency  of the spinal canal no evidence of epidural fluid collection or other unexpected postoperative findings.  Obesity (BMI 30-39.9) BMI  33.49         Subjective: Patient complains of soreness in his upper abdomen and ribs especially if coughing.  Patient still has a rectal tube.  Still feels very weak.  Physical Exam: Vitals:   11/01/23 2020 11/02/23 0359 11/02/23 0400 11/02/23 0742  BP: 118/63  125/60 137/67  Pulse: 84  89 83  Resp: 18  18 18   Temp: 97.6 F (36.4 C)  98.1 F (36.7 C) 97.7 F (36.5 C)  TempSrc: Oral  Oral   SpO2: 97%  96% 98%  Weight:  124.8 kg    Height:       Physical Exam HENT:     Head: Normocephalic.  Eyes:     General: Lids are normal.     Conjunctiva/sclera: Conjunctivae normal.  Cardiovascular:     Rate and Rhythm: Normal rate and regular rhythm.     Heart sounds: Normal heart sounds, S1 normal and S2 normal.  Pulmonary:     Breath sounds: No decreased breath sounds, wheezing, rhonchi or rales.  Abdominal:     Palpations: Abdomen is soft.     Tenderness: There is generalized abdominal tenderness.  Musculoskeletal:     Right lower leg: Swelling present.     Left lower leg: Swelling present.  Skin:    General: Skin is warm.     Findings: No rash.  Neurological:     Mental Status: He is alert and oriented to person, place, and time.     Data Reviewed: Creatinine 1.85, magnesium  1.5, potassium 3.4, hemoglobin 10.2, white blood count 7.2, platelet count 141  Family Communication: Spoke with wife on the phone.  Disposition: Status is: Inpatient Remains inpatient appropriate because: Patient will need rectal tube to come out prior to going to rehab.  Planned Discharge Destination: Rehab    Time spent: 28 minutes  Author: Charlie Patterson, MD 11/02/2023 1:56 PM  For on call review [email or web address].

## 2023-11-02 NOTE — Progress Notes (Signed)
 Physical Therapy Treatment Patient Details Name: Jason [name] MRN: [id number] DOB: 1951-09-19 Today's Date: 11/02/2023   History of Present Illness Jason [name] is a 73 year old male with history of CAD status post four-vessel [name] in 2017, hypertension, hyperlipidemia, non-insulin -dependent diabetes mellitus, history of lumbar stenosis status post spinal stenosis surgery on 10/18/2023, who presents emergency department for chief concerns of generalized weakness since being discharged post spinal surgery, and intractable nausea and vomiting. Patient found to have ileus.    PT Comments  Patient is agreeable to PT session. He is eager to mobilize. Patient continues to require +2 person assistance for bed mobility and transfers. Activity tolerance limited by generalized weakness, fatigue, and right knee pain. Used steady for bed to chair transfer. Patient pleased to be sitting up in the chair at end of session. Recommend to continue PT to maximize independence and facilitate return to prior level of function.    If plan is discharge home, recommend the following: Two people to help with walking and/or transfers;A lot of help with bathing/dressing/bathroom   Can travel by private vehicle     No  Equipment Recommendations   (to be determined)    Recommendations for Other Services       Precautions / Restrictions Precautions Precautions: Back Precaution Comments: recent lumbar surgery, no brace needed Restrictions Weight Bearing Restrictions Per Provider Order: No     Mobility  Bed Mobility Overal bed mobility: Needs Assistance Bed Mobility: Rolling, Sidelying to Sit Rolling: Max assist, +2 for physical assistance Sidelying to sit: Max assist, +2 for physical assistance       General bed mobility comments: cues for logroll technique    Transfers Overall transfer level: Needs assistance Equipment used:  (steady) Transfers: Bed to chair/wheelchair/BSC Sit to Stand: Max  assist, +2 physical assistance           General transfer comment: sit to stand performed from bed to stedy and from stedy to recliner chair. +2 person assistance required Transfer via Lift Equipment: Stedy  Ambulation/Gait               General Gait Details: unable to at this time   Optometrist     Tilt Bed    Modified Rankin (Stroke Patients Only)       Balance Overall balance assessment: Needs assistance Sitting-balance support: Feet supported Sitting balance-Leahy Scale: Fair     Standing balance support: Bilateral upper extremity supported Standing balance-Leahy Scale: Poor Standing balance comment: external support required                            Cognition Arousal: Alert Behavior During Therapy: WFL for tasks assessed/performed Overall Cognitive Status: No family/caregiver present to determine baseline cognitive functioning                                 General Comments: patient able to follow single step commands consistently with increased time        Exercises      General Comments General comments (skin integrity, edema, etc.): patient pleased to be sitting up in the chair at end of session      Pertinent Vitals/Pain Pain Assessment Pain Assessment: Faces Faces Pain Scale: Hurts even more Pain Location: R knee Pain Descriptors / Indicators: Discomfort, Grimacing Pain Intervention(s): Limited  activity within patient's tolerance, Monitored during session, Repositioned    Home Living                          Prior Function            PT Goals (current goals can now be found in the care plan section) Acute Rehab PT Goals Patient Stated Goal: to get stronger and walk PT Goal Formulation: With patient Time For Goal Achievement: 11/11/23 Potential to Achieve Goals: Fair Progress towards PT goals: Progressing toward goals    Frequency    Min  1X/week      PT Plan      Co-evaluation PT/OT/SLP Co-Evaluation/Treatment: Yes Reason for Co-Treatment: For patient/therapist safety;To address functional/ADL transfers PT goals addressed during session: Mobility/safety with mobility OT goals addressed during session: ADL's and self-care      AM-PAC PT 6 Clicks Mobility   Outcome Measure  Help needed turning from your back to your side while in a flat bed without using bedrails?: A Lot Help needed moving from lying on your back to sitting on the side of a flat bed without using bedrails?: A Lot Help needed moving to and from a bed to a chair (including a wheelchair)?: Total Help needed standing up from a chair using your arms (e.g., wheelchair or bedside chair)?: Total Help needed to walk in hospital room?: Total Help needed climbing 3-5 steps with a railing? : Total 6 Click Score: 8    End of Session   Activity Tolerance: Patient tolerated treatment well Patient left: in chair;with call bell/phone within reach   PT Visit Diagnosis: Other abnormalities of gait and mobility (R26.89);Muscle weakness (generalized) (M62.81);Pain     Time: 1410-1440 PT Time Calculation (min) (ACUTE ONLY): 30 min  Charges:    $Therapeutic Activity: 8-22 mins PT General Charges $$ ACUTE PT VISIT: 1 Visit                    Randine Essex, PT, MPT    Randine LULLA Essex 11/02/2023, 2:50 PM

## 2023-11-02 NOTE — Progress Notes (Signed)
 [facility]  ROUNDING NOTE   Subjective:   01/06 0701 - 01/07 0700 In: 0  Out: 1975 [Urine:1975] Lab Results  Component Value Date   CREATININE 1.85 (H) 11/02/2023   CREATININE 2.46 (H) 10/31/2023   CREATININE 2.79 (H) 10/30/2023   Patient resting in bed Alert and oriented Appetite remains poor   Creatinine 1.85 Urine output 1975 ml in preceding 24 hours  Objective:  Vital signs in last 24 hours:  Temp:  [97.6 F (36.4 C)-98.4 F (36.9 C)] 97.7 F (36.5 C) (01/07 0742) Pulse Rate:  [83-89] 83 (01/07 0742) Resp:  [18] 18 (01/07 0742) BP: (118-137)/(60-67) 137/67 (01/07 0742) SpO2:  [94 %-98 %] 98 % (01/07 0742) Weight:  [124.8 kg] 124.8 kg (01/07 0359)  Weight change: 9.8 kg Filed Weights   10/31/23 0415 11/01/23 0400 11/02/23 0359  Weight: 114.1 kg 115 kg 124.8 kg    Intake/Output: I/O last 3 completed shifts: In: 1029.4 [P.O.:740; I.V.:289.4] Out: 3400 [Urine:3075; Stool:325]   Intake/Output this shift:  Total I/O In: 0  Out: 600 [Urine:600]  Physical Exam: General: No acute distress  Head: Normocephalic, atraumatic. Moist oral mucosal membranes  Lungs:  Clear to auscultation, normal effort  Heart: S1S2 no rubs  Abdomen:  Soft, nontender, bowel sounds present  Extremities: No peripheral edema.  Neurologic: Awake, alert, following commands  Skin: No acute rash  Access: None    Basic Metabolic Panel: Recent Labs  Lab 10/27/23 0347 10/28/23 0510 10/29/23 0412 10/30/23 0407 10/31/23 0514 11/02/23 0535  NA 138 138 142 140 138 137  K 3.8 3.8 3.6 3.3* 3.6 3.4*  CL 103 106 111 109 107 106  CO2 23 22 21* 21* 21* 24  GLUCOSE 151* 246* 177* 140* 161* 174*  BUN 103* 107* 93* 80* 62* 36*  CREATININE 4.26* 3.95* 3.44* 2.79* 2.46* 1.85*  CALCIUM  8.2* 8.1* 8.3* 8.3* 8.5* 8.9  MG 2.3 2.2 2.0  --  1.6* 1.5*  PHOS 3.2 3.1 3.0  --  3.5 3.1    Liver Function Tests: Recent Labs  Lab 10/27/23 0347 10/28/23 0510 10/30/23 1216  AST 78* 40  23  ALT 95* 70* 36  ALKPHOS 45 46 38  BILITOT 0.8 0.8 0.5  PROT 4.8* 5.0* 5.0*  ALBUMIN  2.2* 2.1* 2.2*   No results for input(s): LIPASE, AMYLASE in the last 168 hours.  No results for input(s): AMMONIA in the last 168 hours.  CBC: Recent Labs  Lab 10/28/23 0510 10/29/23 0412 10/30/23 0407 10/31/23 0514 11/02/23 0535  WBC 8.3 9.5 9.6 7.4 7.2  NEUTROABS  --   --   --  4.8  --   HGB 11.8* 11.3* 11.2* 11.1* 10.2*  HCT 34.6* 32.6* 33.6* 32.2* 30.5*  MCV 92.0 89.3 91.1 89.7 92.1  PLT 135* 129* 105* 119* 141*    Cardiac Enzymes: No results for input(s): CKTOTAL, CKMB, CKMBINDEX, TROPONINI in the last 168 hours.  BNP: Invalid input(s): POCBNP  CBG: Recent Labs  Lab 11/01/23 1247 11/01/23 1713 11/01/23 2102 11/02/23 0744 11/02/23 1200  GLUCAP 192* 239* 194* 149* 190*    Microbiology: Results for orders placed or performed during the hospital encounter of 10/24/23  Blood culture (routine x 2)     Status: None   Collection Time: 10/24/23  1:14 PM   Specimen: BLOOD  Result Value Ref Range Status   Specimen Description BLOOD BLOOD LEFT HAND  Final   Special Requests   Final    BOTTLES DRAWN AEROBIC AND ANAEROBIC Blood  Culture adequate volume   Culture   Final    NO GROWTH 5 DAYS Performed at [facility], [address] [place], [place] [address]    Report Status 10/29/2023 FINAL  Final  Blood culture (routine x 2)     Status: None   Collection Time: 10/24/23  8:20 PM   Specimen: BLOOD LEFT HAND  Result Value Ref Range Status   Specimen Description BLOOD LEFT HAND  Final   Special Requests   Final    BOTTLES DRAWN AEROBIC AND ANAEROBIC Blood Culture results may not be optimal due to an inadequate volume of blood received in culture bottles   Culture   Final    NO GROWTH 5 DAYS Performed at [facility], [address] [place], [place] [address]    Report Status 10/29/2023 FINAL  Final  MRSA Next Gen by PCR, Nasal      Status: None   Collection Time: 10/25/23  2:05 AM   Specimen: Nasal Mucosa; Nasal Swab  Result Value Ref Range Status   MRSA by PCR Next Gen NOT DETECTED NOT DETECTED Final    Comment: (NOTE) The GeneXpert MRSA Assay (FDA approved for NASAL specimens only), is one component of a comprehensive MRSA colonization surveillance program. It is not intended to diagnose MRSA infection nor to guide or monitor treatment for MRSA infections. Test performance is not FDA approved in patients less than 2 years old. Performed at [facility], [address] [place], [place] [address]   SARS Coronavirus 2 by RT PCR (hospital order, performed in [facility] hospital lab) *cepheid single result test* Anterior Nasal Swab     Status: None   Collection Time: 10/25/23  8:37 AM   Specimen: Anterior Nasal Swab  Result Value Ref Range Status   SARS Coronavirus 2 by RT PCR NEGATIVE NEGATIVE Final    Comment: (NOTE) SARS-CoV-2 target nucleic acids are NOT DETECTED.  The SARS-CoV-2 RNA is generally detectable in upper and lower respiratory specimens during the acute phase of infection. The lowest concentration of SARS-CoV-2 viral copies this assay can detect is 250 copies / mL. A negative result does not preclude SARS-CoV-2 infection and should not be used as the sole basis for treatment or other patient management decisions.  A negative result may occur with improper specimen collection / handling, submission of specimen other than nasopharyngeal swab, presence of viral mutation(s) within the areas targeted by this assay, and inadequate number of viral copies (<250 copies / mL). A negative result must be combined with clinical observations, patient history, and epidemiological information.  Fact Sheet for Patients:   roadlaptop.co.za  Fact Sheet for Healthcare Providers: http://kim-miller.com/  This test is not yet approved or  cleared by the  [place]  FDA and has been authorized for detection and/or diagnosis of SARS-CoV-2 by FDA under an Emergency Use Authorization (EUA).  This EUA will remain in effect (meaning this test can be used) for the duration of the COVID-19 declaration under Section 564(b)(1) of the Act, 21 U.S.C. section 360bbb-3(b)(1), unless the authorization is terminated or revoked sooner.  Performed at [facility], [address] [place], [place] [address]   Respiratory (~20 pathogens) panel by PCR     Status: None   Collection Time: 10/25/23  8:37 AM   Specimen: Nasopharyngeal Swab; Respiratory  Result Value Ref Range Status   Adenovirus NOT DETECTED NOT DETECTED Final   Coronavirus 229E NOT DETECTED NOT DETECTED Final    Comment: (NOTE)  The Coronavirus on the Respiratory Panel, DOES NOT test for the novel  Coronavirus (2019 nCoV)    Coronavirus HKU1 NOT DETECTED NOT DETECTED Final   Coronavirus NL63 NOT DETECTED NOT DETECTED Final   Coronavirus OC43 NOT DETECTED NOT DETECTED Final   Metapneumovirus NOT DETECTED NOT DETECTED Final   Rhinovirus / Enterovirus NOT DETECTED NOT DETECTED Final   Influenza A NOT DETECTED NOT DETECTED Final   Influenza B NOT DETECTED NOT DETECTED Final   Parainfluenza Virus 1 NOT DETECTED NOT DETECTED Final   Parainfluenza Virus 2 NOT DETECTED NOT DETECTED Final   Parainfluenza Virus 3 NOT DETECTED NOT DETECTED Final   Parainfluenza Virus 4 NOT DETECTED NOT DETECTED Final   Respiratory Syncytial Virus NOT DETECTED NOT DETECTED Final   Bordetella pertussis NOT DETECTED NOT DETECTED Final   Bordetella Parapertussis NOT DETECTED NOT DETECTED Final   Chlamydophila pneumoniae NOT DETECTED NOT DETECTED Final   Mycoplasma pneumoniae NOT DETECTED NOT DETECTED Final    Comment: Performed at [facility], [address], [place], [place] [address]    Coagulation Studies: No results for input(s): LABPROT, INR in the last 72 hours.   Urinalysis: No  results for input(s): COLORURINE, LABSPEC, PHURINE, GLUCOSEU, HGBUR, BILIRUBINUR, KETONESUR, PROTEINUR, UROBILINOGEN, NITRITE, LEUKOCYTESUR in the last 72 hours.  Invalid input(s): APPERANCEUR    Imaging: No results found.    Medications:      amiodarone   100 mg Oral Daily   apixaban   5 mg Oral BID   capsaicin   1 Application Topical BID   Chlorhexidine  Gluconate Cloth  6 each Topical Daily   Gerhardt's butt cream   Topical BID   insulin  aspart  0-20 Units Subcutaneous TID AC   insulin  aspart  0-5 Units Subcutaneous QHS   levothyroxine   50 mcg Oral Q0600   lidocaine   1 patch Transdermal Q24H   melatonin  5 mg Oral QHS   midodrine   5 mg Oral TID WC   multivitamin with minerals  1 tablet Oral Q lunch   pantoprazole   40 mg Oral Daily   sodium chloride  flush  10-40 mL Intracatheter Q12H   acetaminophen , mouth rinse, senna-docusate, sodium chloride  flush  Assessment/ Plan:  73 y.o. male  with coronary artery disease status post [name], hypertension, hyperlipidemia, diabetes mellitus type II noninsulin dependent, who was admitted to [facility] on 10/24/2023 for Ileus [facility]) [K56.7] Status post lumbar stenosis surgery on 10/18/23.   Acute Kidney Injury/chronic kidney disease stage IIIa: baseline creatinine of 1.28, GFR of 59.  Suspect acute kidney injury due to ATN most likely.  Entresto , torsemide , metformin , Farxiga  all on hold.   Update: Creatinine improving with IVF and adequate oral intake. Patient will need to follow up with our office at discharge.   Hypotension.  Patient remains on amiodarone  and midodrine .  Due to renal recovery, we will sign off at this time.    LOS: 9 Tome Wilson 1/7/20252:38 PM

## 2023-11-02 NOTE — Progress Notes (Signed)
 Physical Therapy Treatment Patient Details Name: JAYLIN BENZEL MRN: [id number] DOB: 1951/02/10 Today's Date: 11/02/2023   History of Present Illness Mr. Mcclellan Demarais is a 73 year old male with history of CAD status post four-vessel [name] in 2017, hypertension, hyperlipidemia, non-insulin -dependent diabetes mellitus, history of lumbar stenosis status post spinal stenosis surgery on 10/18/2023, who presents emergency department for chief concerns of generalized weakness since being discharged post spinal surgery, and intractable nausea and vomiting. Patient found to have ileus.    PT Comments  Returned for second time to assist patient with transition from chair back to bed. The patient continues to require +2 person assistance for transfers with limited activity tolerance. Standing tolerance limited to a few seconds and steady used for safety for transition from chair to bed. Fecal management system was dislodged during the transfer. Continue to recommend rehabilitation < 3 hours/day after this hospital stay.    If plan is discharge home, recommend the following: Two people to help with walking and/or transfers;A lot of help with bathing/dressing/bathroom   Can travel by private vehicle     No  Equipment Recommendations   (to be determined at next level of care)    Recommendations for Other Services       Precautions / Restrictions Precautions Precautions: Back Precaution Comments: recent lumbar surgery, no brace needed Restrictions Weight Bearing Restrictions Per Provider Order: No     Mobility  Bed Mobility Overal bed mobility: Needs Assistance Bed Mobility: Rolling, Sit to Sidelying Rolling: Max assist Sidelying to sit: Max assist, +2 for physical assistance     Sit to sidelying: Max assist, +2 for physical assistance General bed mobility comments: +2 person assistance reuqired with cues for technique. patient positioned towards the left side for comfort at end of session     Transfers Overall transfer level: Needs assistance Equipment used:  (steady) Transfers: Sit to/from Stand Sit to Stand: Max assist, +2 physical assistance           General transfer comment: patient continues to require +2 person assistance for standing. steady used for safety for transition from bed to chair. activity tolerance is limited by fatigue. of note, fecal management tube came all of the way out during the transfer- alerted the nurse, MD, and charge nurse Transfer via Lift Equipment: Stedy  Ambulation/Gait               General Gait Details: unable to at this time   Stairs             Wheelchair Mobility     Tilt Bed    Modified Rankin (Stroke Patients Only)       Balance Overall balance assessment: Needs assistance Sitting-balance support: Feet supported Sitting balance-Leahy Scale: Good     Standing balance support: Bilateral upper extremity supported Standing balance-Leahy Scale: Poor Standing balance comment: external support required                            Cognition Arousal: Alert Behavior During Therapy: WFL for tasks assessed/performed Overall Cognitive Status: No family/caregiver present to determine baseline cognitive functioning                                 General Comments: patient able to follow single step commands consistently with increased time        Exercises      General Comments General comments (  skin integrity, edema, etc.): patient pleased to be sitting up in the chair at end of session      Pertinent Vitals/Pain Pain Assessment Pain Assessment: Faces Faces Pain Scale: Hurts even more Pain Location: R knee Pain Descriptors / Indicators: Discomfort, Grimacing Pain Intervention(s): Limited activity within patient's tolerance, Monitored during session, Repositioned    Home Living                          Prior Function            PT Goals (current goals can  now be found in the care plan section) Acute Rehab PT Goals Patient Stated Goal: to get stronger and walk PT Goal Formulation: With patient Time For Goal Achievement: 11/11/23 Potential to Achieve Goals: Fair Progress towards PT goals: Progressing toward goals    Frequency    Min 1X/week      PT Plan      Co-evaluation PT/OT/SLP Co-Evaluation/Treatment: Yes Reason for Co-Treatment: For patient/therapist safety;To address functional/ADL transfers PT goals addressed during session: Mobility/safety with mobility OT goals addressed during session: ADL's and self-care      AM-PAC PT 6 Clicks Mobility   Outcome Measure  Help needed turning from your back to your side while in a flat bed without using bedrails?: A Lot Help needed moving from lying on your back to sitting on the side of a flat bed without using bedrails?: A Lot Help needed moving to and from a bed to a chair (including a wheelchair)?: Total Help needed standing up from a chair using your arms (e.g., wheelchair or bedside chair)?: Total Help needed to walk in hospital room?: Total Help needed climbing 3-5 steps with a railing? : Total 6 Click Score: 8    End of Session   Activity Tolerance: Patient tolerated treatment well Patient left: with call bell/phone within reach;in bed;with bed alarm set Nurse Communication: Mobility status;Other (comment) (FMS came out during transfer) PT Visit Diagnosis: Other abnormalities of gait and mobility (R26.89);Muscle weakness (generalized) (M62.81);Pain     Time: 8469-8445 PT Time Calculation (min) (ACUTE ONLY): 24 min  Charges:    $Therapeutic Activity: 23-37 mins PT General Charges $$ ACUTE PT VISIT: 1 Visit                     Randine Essex, PT, MPT    Randine LULLA Essex 11/02/2023, 4:06 PM

## 2023-11-02 NOTE — Plan of Care (Signed)

## 2023-11-02 NOTE — Assessment & Plan Note (Signed)
 Oral replacement

## 2023-11-03 LAB — GLUCOSE, CAPILLARY
Glucose-Capillary: 163 mg/dL — ABNORMAL HIGH (ref 70–99)
Glucose-Capillary: 196 mg/dL — ABNORMAL HIGH (ref 70–99)
Glucose-Capillary: 193 mg/dL — ABNORMAL HIGH (ref 70–99)
Glucose-Capillary: 174 mg/dL — ABNORMAL HIGH (ref 70–99)

## 2023-11-03 LAB — BASIC METABOLIC PANEL
Anion gap: 10 (ref 5–15)
BUN: 30 mg/dL — ABNORMAL HIGH (ref 8–23)
CO2: 22 mmol/L (ref 22–32)
Calcium: 9 mg/dL (ref 8.9–10.3)
Chloride: 108 mmol/L (ref 98–111)
Creatinine, Ser: 1.65 mg/dL — ABNORMAL HIGH (ref 0.61–1.24)
GFR, Estimated: 44 mL/min — ABNORMAL LOW (ref >60)
Glucose, Bld: 202 mg/dL — ABNORMAL HIGH (ref 70–99)
Potassium: 3.7 mmol/L (ref 3.5–5.1)
Sodium: 140 mmol/L (ref 135–145)

## 2023-11-03 LAB — MAGNESIUM: Magnesium: 1.7 mg/dL (ref 1.7–2.4)

## 2023-11-03 MED ORDER — MAGNESIUM SULFATE 2 GM/50ML IV SOLN
2.0000 g | Freq: Once | INTRAVENOUS | Status: AC
  Administered 2023-11-03: 2 g via INTRAVENOUS
  Filled 2023-11-03: qty 50

## 2023-11-03 MED ORDER — ENSURE MAX PROTEIN PO LIQD
11.0000 [oz_av] | Freq: Two times a day (BID) | ORAL | Status: DC
  Administered 2023-11-03 – 2023-11-04 (×2): 11 [oz_av] via ORAL
  Filled 2023-11-03: qty 330

## 2023-11-03 MED ORDER — MIDODRINE HCL 5 MG PO TABS
5.0000 mg | ORAL_TABLET | Freq: Two times a day (BID) | ORAL | Status: DC
Start: 2023-11-03 — End: 2023-11-04
  Administered 2023-11-03 – 2023-11-04 (×2): 5 mg via ORAL
  Filled 2023-11-03 (×2): qty 1

## 2023-11-03 MED ORDER — INSULIN GLARGINE-YFGN 100 UNIT/ML ~~LOC~~ SOLN
3.0000 [IU] | Freq: Every day | SUBCUTANEOUS | Status: DC
  Administered 2023-11-03: 3 [IU] via SUBCUTANEOUS
  Filled 2023-11-03 (×2): qty 0.03

## 2023-11-03 NOTE — Plan of Care (Signed)

## 2023-11-03 NOTE — Progress Notes (Signed)
 Nutrition Follow Up Note   DOCUMENTATION CODES:   Not applicable  INTERVENTION:   Ensure Max protein supplement po BID, each supplement provides 150kcal and 30g of protein.  Magic cup TID with meals, each supplement provides 290 kcal and 9 grams of protein  MVI po daily   Pt at high refeed risk; recommend monitor potassium, magnesium  and phosphorus labs daily until stable  Daily weights   NUTRITION DIAGNOSIS:   Inadequate oral intake related to acute illness as evidenced by per patient/family report. -ongoing   GOAL:   Patient will meet greater than or equal to 90% of their needs -progressing   MONITOR:   PO intake, Supplement acceptance, Labs, Weight trends, I & O's, Skin   ASSESSMENT:   73 y/o male with h/o CAD s/p [name], DM, HTN, PAF, CHF, GERD and spinal stenosis s/p bilateral L3-4 and L4-5 laminotomy/foraminotomies 12/23 who is admitted with sepsis, AKI and suspected ileus.  Met with pt in room today. Pt sitting up and in good spirits today. Pt reports that his appetite is improved but reports that he is scared to eat as he does not want to have an accident in the bed. Pt reports that he ate 50% of his spaghetti today and some broccoli. Pt complaining of receiving too much food on his plates; pt ordered for double protein so RD will discontinue this order. RD discussed with pt the importance of adequate nutrition needed to preserve lean muscle. Pt is agreeable to drinking Ensure supplements. Pt reports abdominal pain in his mid abdomen and down each side, mainly when he coughs or moves around. Pt believes that he might have pulled a muscle while vomiting or coughing. Pt is having firmer Bms and passing flatus; rectal tube removed. Per chart, pt is up ~20lbs since admission. Pt is unsure of his UBW; he reports that he did weigh 285lbs but reports that he had lost down to 250lbs pta.    Medications reviewed and include: insulin , synthroid , melatonin, midodrine , MVI,  protonix , Mg sulfate  Labs reviewed: K 3.7 wnl, BUN 30(H), creat 1.65(H), Mg 1.7 wnl P 3.1 wnl- 1/7 Hgb 10.2(L), Hct 30.5(L) Cbgs-  193, 196, 163 x 24 hrs   Nutrition Focused Physical Exam:  Flowsheet Row Most Recent Value  Orbital Region No depletion  Upper Arm Region Mild depletion  Thoracic and Lumbar Region No depletion  Buccal Region No depletion  Temple Region No depletion  Clavicle Bone Region No depletion  Clavicle and Acromion Bone Region No depletion  Scapular Bone Region No depletion  Dorsal Hand No depletion  Patellar Region Mild depletion  Anterior Thigh Region Mild depletion  Posterior Calf Region Mild depletion  Edema (RD Assessment) Mild  Hair Reviewed  Eyes Reviewed  Mouth Reviewed  Skin Reviewed  Nails Reviewed   Diet Order:   Diet Order             Diet Carb Modified Fluid consistency: Thin  Diet effective now                  EDUCATION NEEDS:   No education needs have been identified at this time  Skin:  Skin Assessment: Skin Integrity Issues: Skin Integrity Issues:: Incisions, Other (Comment) Incisions: closed mid vertebal column Other: small red open areas on buttocks  Last BM:  1/7- per pt  Height:   Ht Readings from Last 1 Encounters:  10/24/23 6' 4 (1.93 m)    Weight:   Wt Readings from Last  1 Encounters:  11/03/23 123.2 kg    Ideal Body Weight:  91.8 kg  BMI:  Body mass index is 33.06 kg/m.  Estimated Nutritional Needs:   Kcal:  2500-2800kcal/day  Protein:  125-140g/day  Fluid:  2.3-2.6L/day  Augustin Shams MS, RD, LDN If unable to be reached, please send secure chat to RD inpatient available from 8:00a-4:00p daily

## 2023-11-03 NOTE — Progress Notes (Signed)
 Progress Note   Patient: Jason [name] FMW:[id number] DOB: 07-07-1951 DOA: 10/24/2023     10 DOS: the patient was seen and examined on 11/03/2023   Brief hospital course: Mr. Robet Crutchfield is a 73 year old male with history of CAD status post four-vessel [name] in 2017, hypertension, hyperlipidemia, non-insulin -dependent diabetes mellitus, history of lumbar stenosis status post spinal stenosis surgery on 10/18/2023, who presents emergency department for chief concerns of generalized weakness since being discharged post spinal surgery, and intractable nausea and vomiting that started last evening.   12/29: Admitted to MedSurg unit with suspected postop ileus NG tube placed for decompression.  Overnight became hypoglycemic requiring dextrose  infusion 12/30: Rapid response called for unresponsiveness, hypoglycemia in the low 20s and hypertension (70/37).  Transferred to ICU PCCM consulted 10/26/23 -patient critically ill, on levophed , has not had nourishment in >1 wk.  Plan for PICC. Blood cultures pending, renal function improved but still severe AKI, repeat KUB today showed OGT got displaced to airway , this was replaced and repeated with repeat imaging showing good placement.  10/26/22- patient more oriented, continues to require levophed .  Sacral wound covered.  Levophed  and D5-1/2NS started.   10/27/22- patient had BM overnight, ilius with 6.1cm dilated loops of bowel on KUB this am with NG to suction still, abdomen distended.  [name] ongoing.  PICC line with [name] ongoing. TTE reassuring, remains on 2 vasopressors.  Discussed with dietary and will try trickle clears today due to clinically less abd tenderness.  10/29/23- patient had BM overnight with resolution of ilius, transitioning to PO liquid diet, still is on levophed  5 + midodrine  10 TID, reducing IV amio and plan to transition to PO as able.  1/5.  Transferred to medical service.  Advance to full liquid diet for breakfast and lunch.  Patient able to  advance to solid food later on.  Continue working with physical therapy. 1/6.  Continue advance diet.  Patient still has a rectal tube.  This will need to come out prior to disposition to any rehab. 1/7.  Creatinine down to 1.85, hemoglobin 10.2.  Patient still has rectal tube. 1/7 PM rectal tube removed    Assessment and Plan: * Septic shock (HCC) Resolved. Present on admission.  Lactic acid of 4.2 on presentation.  Required Levophed  during the hospital course.  Currently on midodrine .  Will continue to taper midodrine  to 5mg  BID given normal blood pressures. Completed course of antibiotics.   Ileus (HCC) Likely secondary to pain medications after recent spinal surgery.  Patient has a rectal tube.  Advanced to solid food.  [name] discontinued previously.  Continue protonix .   Acute kidney injury superimposed on CKD (HCC) AKI on CKD stage IIIa.  Baseline creatinine around 1.28.  Creatinine peaked on 1/1 at 4.26. Likely due to ATN.  Continues to improve.  -BMP daily   Elevated liver enzymes Resolved.  Normalized.  Likely secondary to sepsis.  Mixed hyperlipidemia Will resume rosuvastatin  on discharge.   Type II diabetes mellitus (HCC) Hemoglobin A1c 5.8.  On sliding scale.     CBG (last 3)  Recent Labs    11/03/23 0838 11/03/23 1126 11/03/23 1643  GLUCAP 163* 196* 193*   Start low dose semeglee   Hypokalemia Resolved   Hypomagnesemia Resolved   Hypothyroidism Continue levothyroxine   Paroxysmal atrial fibrillation (HCC) Patient on amiodarone  and Eliquis .  Sacral decubitus ulcer, stage II (HCC) Present on admission as per ICU specialist.   Spinal stenosis of lumbar region with neurogenic claudication Follow-up with neurosurgery  as outpatient.  Continue PT and OT.  CT lumbar spine showed interval posterior decompression of the left L3-4 and L4-5 with improved patency of the spinal canal no evidence of epidural fluid collection or other unexpected postoperative  findings.  Obesity (BMI 30-39.9) BMI 33.49         Subjective: Patient continues to feel that he has some weakness in his lower extremities. He states the rectal tube fell out yesterday. He has not had a bowel movement since its removal due to being afraid of soiling himself. He denies any N/V.   Physical Exam: Vitals:   11/03/23 0517 11/03/23 0840 11/03/23 1000 11/03/23 1532  BP: 128/73 118/75  129/68  Pulse: 84 85  88  Resp: 20 18  16   Temp: 97.7 F (36.5 C) 98.7 F (37.1 C)  97.9 F (36.6 C)  TempSrc: Oral Oral  Oral  SpO2: 97% 96%  98%  Weight:   123.2 kg   Height:       Physical Exam  Constitutional: In no distress.  Cardiovascular: Normal rate, regular rhythm. No lower extremity edema  Pulmonary: Non labored breathing on room air, no wheezing or rales.   Abdominal: Soft. Normal bowel sounds. Mild distended and non tender Musculoskeletal: Normal range of motion.     Neurological: Alert and oriented to person, place, and time. BLE 4/5 strength in hips, knees, 5/5 with dorsiflexion and plantarflexion  Skin: Skin is warm and dry.   Data Reviewed:  I have reviewed all labs and images    Disposition: Status is: Inpatient Remains inpatient appropriate because: Awaiting rehab, now that rectal tube is out  Planned Discharge Destination:  Pending     Time spent: 35 minutes  Author: Alban Pepper, MD 11/03/2023 7:00 PM  For on call review [email or web address].

## 2023-11-04 LAB — CBC
HCT: 32.7 % — ABNORMAL LOW (ref 39.0–52.0)
Hemoglobin: 10.9 g/dL — ABNORMAL LOW (ref 13.0–17.0)
MCH: 30.3 pg (ref 26.0–34.0)
MCHC: 33.3 g/dL (ref 30.0–36.0)
MCV: 90.8 fL (ref 80.0–100.0)
Platelets: 173 10*3/uL (ref 150–400)
RBC: 3.6 MIL/uL — ABNORMAL LOW (ref 4.22–5.81)
RDW: 13 % (ref 11.5–15.5)
WBC: 8.2 10*3/uL (ref 4.0–10.5)
nRBC: 0 % (ref 0.0–0.2)

## 2023-11-04 LAB — BASIC METABOLIC PANEL
Anion gap: 9 (ref 5–15)
BUN: 30 mg/dL — ABNORMAL HIGH (ref 8–23)
CO2: 23 mmol/L (ref 22–32)
Calcium: 8.9 mg/dL (ref 8.9–10.3)
Chloride: 106 mmol/L (ref 98–111)
Creatinine, Ser: 1.62 mg/dL — ABNORMAL HIGH (ref 0.61–1.24)
GFR, Estimated: 45 mL/min — ABNORMAL LOW (ref >60)
Glucose, Bld: 185 mg/dL — ABNORMAL HIGH (ref 70–99)
Potassium: 3.8 mmol/L (ref 3.5–5.1)
Sodium: 138 mmol/L (ref 135–145)

## 2023-11-04 LAB — PHOSPHORUS: Phosphorus: 3.1 mg/dL (ref 2.5–4.6)

## 2023-11-04 LAB — GLUCOSE, CAPILLARY
Glucose-Capillary: 184 mg/dL — ABNORMAL HIGH (ref 70–99)
Glucose-Capillary: 204 mg/dL — ABNORMAL HIGH (ref 70–99)
Glucose-Capillary: 248 mg/dL — ABNORMAL HIGH (ref 70–99)

## 2023-11-04 LAB — MAGNESIUM: Magnesium: 2 mg/dL (ref 1.7–2.4)

## 2023-11-04 MED ORDER — SENNOSIDES-DOCUSATE SODIUM 8.6-50 MG PO TABS: 1.0000 | ORAL_TABLET | Freq: Every evening | ORAL | Status: DC | PRN

## 2023-11-04 MED ORDER — ENSURE MAX PROTEIN PO LIQD: 11.0000 [oz_av] | Freq: Two times a day (BID) | ORAL | Status: DC

## 2023-11-04 MED ORDER — PANTOPRAZOLE SODIUM 40 MG PO TBEC: 40.0000 mg | DELAYED_RELEASE_TABLET | Freq: Every day | ORAL | Status: DC

## 2023-11-04 MED ORDER — ADULT MULTIVITAMIN W/MINERALS CH: 1.0000 | ORAL_TABLET | Freq: Every day | ORAL | Status: DC

## 2023-11-04 MED ORDER — MIDODRINE HCL 2.5 MG PO TABS: 2.5000 mg | ORAL_TABLET | Freq: Two times a day (BID) | ORAL | Status: AC

## 2023-11-04 MED ORDER — MELATONIN 5 MG PO TABS: 5.0000 mg | ORAL_TABLET | Freq: Every day | ORAL | Status: DC

## 2023-11-04 MED ORDER — LIDOCAINE 5 % EX PTCH: 1.0000 | MEDICATED_PATCH | CUTANEOUS | Status: DC

## 2023-11-04 MED ORDER — MIDODRINE HCL 5 MG PO TABS
2.5000 mg | ORAL_TABLET | Freq: Two times a day (BID) | ORAL | Status: DC
  Administered 2023-11-04: 2.5 mg via ORAL
  Filled 2023-11-04: qty 1

## 2023-11-04 MED ORDER — GERHARDT'S BUTT CREAM: 1.0000 | TOPICAL_CREAM | Freq: Two times a day (BID) | CUTANEOUS | Status: DC

## 2023-11-04 MED ORDER — METFORMIN HCL ER 500 MG PO TB24: 500.0000 mg | ORAL_TABLET | Freq: Two times a day (BID) | ORAL | Status: DC

## 2023-11-04 NOTE — NC FL2 (Addendum)
 [place]  MEDICAID [id number] LEVEL OF CARE FORM     IDENTIFICATION  Patient Name: Jason [name] Birthdate: 20-Dec-1950 Sex: male Admission Date (Current Location): 10/24/2023  [facility] and [place] Number:  Chiropodist and Address:         Provider Number: [id number]  Attending Physician Name and Address:  Franchot Novel, MD  Relative Name and Phone Number:       Current Level of Care: Hospital Recommended Level of Care: Skilled Nursing Facility Prior Approval Number:    Date Approved/Denied:   PASRR Number:  [id number]  Discharge Plan: SNF    Current Diagnoses: Patient Active Problem List   Diagnosis Date Noted   Hypokalemia 11/02/2023   Hypomagnesemia 11/01/2023   Septic shock (HCC) 10/31/2023   Acute kidney injury superimposed on CKD (HCC) 10/31/2023   Sacral decubitus ulcer, stage II (HCC) 10/31/2023   Paroxysmal atrial fibrillation (HCC) 10/31/2023   Hypothyroidism 10/31/2023   Ileus (HCC) 10/24/2023   Elevated liver enzymes 10/24/2023   Leukocytosis 10/24/2023   Hypoglycemia 10/24/2023   Spinal stenosis of lumbar region with neurogenic claudication 10/18/2023   S/P [name] 04/14/2016   Left main coronary artery disease 04/13/2016   Coronary artery disease involving native coronary artery with unstable angina pectoris (HCC) 04/13/2016   Type II diabetes mellitus (HCC)    Essential hypertension    Mixed hyperlipidemia    Obesity (BMI 30-39.9)     Orientation RESPIRATION BLADDER Height & Weight     Self, Time, Situation, Place  Normal Continent Weight: 123.2 kg Height:  6' 4 (193 cm)  BEHAVIORAL SYMPTOMS/MOOD NEUROLOGICAL BOWEL NUTRITION STATUS      Incontinent Diet (Carb modified)  AMBULATORY STATUS COMMUNICATION OF NEEDS Skin   Extensive Assist Verbally Surgical wounds, Other (Comment) (small open spot on buttocks)                       Personal Care Assistance Level of Assistance              Functional Limitations Info              SPECIAL CARE FACTORS FREQUENCY  PT (By licensed PT), OT (By licensed OT)                    Contractures Contractures Info: Not present    Additional Factors Info  Code Status, Allergies Code Status Info: Full Allergies Info: Aspirin, Inderal (Propranolol), Metformin  Hcl, Mevacor (Lovastatin), Penicillin G, Pravachol (Pravastatin Sodium), Zocor (Simvastatin), Penicillins           Current Medications (11/04/2023):  This is the current hospital active medication list Current Facility-Administered Medications  Medication Dose Route Frequency Provider Last Rate Last Admin   acetaminophen  (TYLENOL ) tablet 650 mg  650 mg Oral Q6H PRN Nelson, Dana G, NP   650 mg at 11/04/23 0433   amiodarone  (PACERONE ) tablet 100 mg  100 mg Oral Daily Aleskerov, Fuad, MD   100 mg at 11/04/23 0859   apixaban  (ELIQUIS ) tablet 5 mg  5 mg Oral BID Nazari, Walid A, RPH   5 mg at 11/04/23 0859   capsaicin  (ZOSTRIX) 0.025 % cream 1 Application  1 Application Topical BID Rust-Chester, Jenita CROME, NP   1 Application at 11/04/23 0900   Chlorhexidine  Gluconate Cloth 2 % PADS 6 each  6 each Topical Daily Jawo, Modou L, NP   6 each at 11/04/23 0900   Gerhardt's butt cream   Topical BID  Aleskerov, Fuad, MD   Given at 11/04/23 0900   insulin  aspart (novoLOG ) injection 0-20 Units  0-20 Units Subcutaneous TID AC Nelson, Dana G, NP   4 Units at 11/04/23 0900   insulin  aspart (novoLOG ) injection 0-5 Units  0-5 Units Subcutaneous QHS Kathrene Almarie Bake, NP   2 Units at 10/30/23 2219   insulin  glargine-yfgn (SEMGLEE ) injection 3 Units  3 Units Subcutaneous QHS Franchot Novel, MD   3 Units at 11/03/23 2238   levothyroxine  (SYNTHROID ) tablet 50 mcg  50 mcg Oral Q0600 Chappell, Alex B, RPH   50 mcg at 11/04/23 9380   lidocaine  (LIDODERM ) 5 % 1 patch  1 patch Transdermal Q24H Aleskerov, Fuad, MD   1 patch at 11/03/23 2238   melatonin tablet 5 mg  5 mg Oral QHS Rust-Chester, Britton L, NP   5 mg at 11/03/23  2238   midodrine  (PROAMATINE ) tablet 5 mg  5 mg Oral BID WC Franchot Novel, MD   5 mg at 11/04/23 0900   multivitamin with minerals tablet 1 tablet  1 tablet Oral Q lunch Josette Ade, MD   1 tablet at 11/03/23 1346   Oral care mouth rinse  15 mL Mouth Rinse PRN Aleskerov, Fuad, MD       pantoprazole  (PROTONIX ) EC tablet 40 mg  40 mg Oral Daily Josette Ade, MD   40 mg at 11/04/23 0900   protein supplement (ENSURE MAX) liquid  11 oz Oral BID Franchot Novel, MD   11 oz at 11/04/23 0900   senna-docusate (Senokot-S) tablet 1 tablet  1 tablet Oral QHS PRN Aleskerov, Fuad, MD       sodium chloride  flush (NS) 0.9 % injection 10-40 mL  10-40 mL Intracatheter Q12H Aleskerov, Fuad, MD   10 mL at 11/04/23 0900   sodium chloride  flush (NS) 0.9 % injection 10-40 mL  10-40 mL Intracatheter PRN Aleskerov, Fuad, MD         Discharge Medications: Please see discharge summary for a list of discharge medications.  Relevant Imaging Results:  Relevant Lab Results:   Additional Information ss [id number]  Corean ONEIDA Haddock, RN

## 2023-11-04 NOTE — Discharge Summary (Signed)
 Physician Discharge Summary   Patient: Jason [name] MRN: [id number] DOB: May 19, 1951  Admit date:     10/24/2023  Discharge date: 11/04/23  Discharge Physician: Alban Pepper   PCP: Stephanie Charlene CROME, MD   Recommendations at discharge:    Needs repeat BMP in 7 days to ensure renal function is stable/improving Will need to wean midodrine  and decide when to resume entresto  and torsemide   Decide when/if to resume glimepiride    Discharge Diagnoses: Principal Problem:   Septic shock (HCC) Active Problems:   Ileus (HCC)   Acute kidney injury superimposed on CKD (HCC)   Elevated liver enzymes   Coronary artery disease involving native coronary artery with unstable angina pectoris (HCC)   Essential hypertension   Mixed hyperlipidemia   Type II diabetes mellitus (HCC)   Leukocytosis   Hypoglycemia   Obesity (BMI 30-39.9)   S/P [name]   Spinal stenosis of lumbar region with neurogenic claudication   Sacral decubitus ulcer, stage II (HCC)   Paroxysmal atrial fibrillation (HCC)   Hypothyroidism   Hypomagnesemia   Hypokalemia  Resolved Problems:   * No resolved hospital problems. [facility] Course: [name] is a 73 year old male with history of CAD status post four-vessel [name] in 2017, hypertension, hyperlipidemia, non-insulin -dependent diabetes mellitus, history of lumbar stenosis status post spinal stenosis surgery on 10/18/2023, who presents emergency department for chief concerns of generalized weakness since being discharged post spinal surgery, and intractable nausea and vomiting that started last evening.   12/29: Admitted to MedSurg unit with suspected postop ileus NG tube placed for decompression.  Overnight became hypoglycemic requiring dextrose  infusion 12/30: Rapid response called for unresponsiveness, hypoglycemia in the low 20s and hypertension (70/37).  Transferred to ICU PCCM consulted 10/26/23 -patient critically ill, on levophed , has not had nourishment  in >1 wk.  Plan for PICC. Blood cultures pending, renal function improved but still severe AKI, repeat KUB today showed OGT got displaced to airway , this was replaced and repeated with repeat imaging showing good placement.  10/26/22- patient more oriented, continues to require levophed .  Sacral wound covered.  Levophed  and D5-1/2NS started.   10/27/22- patient had BM overnight, ilius with 6.1cm dilated loops of bowel on KUB this am with NG to suction still, abdomen distended.  [name] ongoing.  PICC line with [name] ongoing. TTE reassuring, remains on 2 vasopressors.  Discussed with dietary and will try trickle clears today due to clinically less abd tenderness.  10/29/23- patient had BM overnight with resolution of ilius, transitioning to PO liquid diet, still is on levophed  5 + midodrine  10 TID, reducing IV amio and plan to transition to PO as able.  1/5.  Transferred to medical service.  Advance to full liquid diet for breakfast and lunch.  Patient able to advance to solid food later on.  Continue working with physical therapy. 1/6.  Continue advance diet.  Patient still has a rectal tube.  This will need to come out prior to disposition to any rehab. 1/7.  Creatinine down to 1.85, hemoglobin 10.2.  Patient still has rectal tube. 1/7 PM rectal tube removed 1/8, 1/9 BM    Assessment and Plan: * Septic shock (HCC) Resolved  Present on admission.  Lactic acid of 4.2 on presentation.  Required Levophed  during the hospital course.  Decreased dose of midodrine  down to 5 mg 3 times daily.  Continue to taper midodrine  if able.  Blood pressure stable.  Completed antibiotics.  Ileus (HCC) Resolved  Likely  secondary to pain medications after recent spinal surgery.  Patient had Bms prior to discharge. He was tolerating some solid food with no nausea or emesis.  [name] discontinued previously.  He will continue on PPI and a bowel regimen on discharge.   Acute kidney injury superimposed on CKD (HCC) AKI on CKD stage  IIIa.  Baseline creatinine around 1.28. Creatinine peaked on 1/1 at 4.26. Now 1.62.      Latest Ref Rng & Units 11/04/2023    4:45 AM 11/03/2023   10:17 AM 11/02/2023    5:35 AM  BMP  Glucose 70 - 99 mg/dL 814  797  825   BUN 8 - 23 mg/dL 30  30  36   Creatinine 0.61 - 1.24 mg/dL 8.37  8.34  8.14   Sodium 135 - 145 mmol/L 138  140  137   Potassium 3.5 - 5.1 mmol/L 3.8  3.7  3.4   Chloride 98 - 111 mmol/L 106  108  106   CO2 22 - 32 mmol/L 23  22  24    Calcium  8.9 - 10.3 mg/dL 8.9  9.0  8.9    Will need follow up in 1 week with a BMP to ensure renal function continues to improve.   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 Will need follow up with PCP/cardiology to determine when to resume this.  Farxiga  and metoprolol  continued at discharge.   Paroxysmal atrial fibrillation  Resume metoprolol  on discharge, held during hospitalization due to low blood pressures that are normalizing. Amiodarone  was continued.   HTN Requiring midodrine  on discharge. Will resume metoprolol . Will need f/u with PCP/cardiology to determine when to resume torsemide  and entresto .   Mixed hyperlipidemia Crestor  resumed on discharge.    Type II diabetes mellitus (HCC) well controlled.  Hemoglobin A1c 5.8.  Had some elevated blood glucoses during this hospitalization prior to discharge.  Resumed home metformin  but transitioned to lower dose extended release formulation given report of diarrhea and resumed farxiga . Will need this medications titrated at discharge. Will hold on glimepiride  given his PO intake is variable currently. This can be added back on in the outpt setting.   Hypothyroidism Continue levothyroxine   Paroxysmal atrial fibrillation (HCC) Patient on amiodarone  and Eliquis .  Sacral decubitus ulcer, stage II (HCC) Present on admission as per ICU specialist.  Will need to continue to monitor.   Spinal stenosis of lumbar  region with neurogenic claudication Follow-up with neurosurgery as outpatient.  Continue PT and OT.  CT lumbar spine showed interval posterior decompression of the left L3-4 and L4-5 with improved patency of the spinal canal no evidence of epidural fluid collection or other unexpected postoperative findings.  Obesity (BMI 30-39.9) BMI 33.49        Consultants: Nephrology, pulm/crit care  Procedures performed: PICC line placed   Disposition: Skilled nursing facility Diet recommendation:  Carb modified diet DISCHARGE MEDICATION: Allergies as of 11/04/2023       Reactions   Aspirin Anaphylaxis   Inderal [propranolol]    Other reaction(s): Other (See Comments) Fatigue   Metformin  Hcl Diarrhea   Pt still takes medication   Mevacor [lovastatin] Other (See Comments)   muscle Pain   Penicillin G Hives   Pravachol [pravastatin Sodium] Other (See Comments)   Muscle Pain   Zocor [simvastatin] Other (See Comments)   Muscle Pain   Penicillins Rash   Has patient had a PCN  reaction causing immediate rash, facial/tongue/throat swelling, SOB or lightheadedness with hypotension: Yes Has patient had a PCN reaction causing severe rash involving mucus membranes or skin necrosis: Yes Has patient had a PCN reaction that required hospitalization No Has patient had a PCN reaction occurring within the last 10 years: Yes If all of the above answers are NO, then may proceed with Cephalosporin use.        Medication List     PAUSE taking these medications    Entresto  49-51 MG Wait to take this until your doctor or other care provider tells you to start again. Generic drug: sacubitril -valsartan  Take 1 tablet by mouth 2 (two) times daily.   torsemide  20 MG tablet Wait to take this until your doctor or other care provider tells you to start again. Commonly known as: DEMADEX  Take 20 mg by mouth in the morning.       STOP taking these medications    cyclobenzaprine  10 MG tablet Commonly  known as: FLEXERIL    glimepiride  4 MG tablet Commonly known as: AMARYL    metFORMIN  1000 MG tablet Commonly known as: GLUCOPHAGE  Replaced by: metFORMIN  500 MG 24 hr tablet   Oxycodone  HCl 10 MG Tabs       TAKE these medications    acetaminophen  500 MG tablet Commonly known as: TYLENOL  Take 500-1,000 mg by mouth every 6 (six) hours as needed (pain.).   amiodarone  200 MG tablet Commonly known as: PACERONE  Take 100 mg by mouth in the morning.   docusate sodium  100 MG capsule Commonly known as: Colace Take 1 capsule (100 mg total) by mouth 2 (two) times daily.   Eliquis  5 MG Tabs tablet Generic drug: apixaban  Take 1 tablet (5 mg total) by mouth 2 (two) times daily.   Ensure Max Protein Liqd Take 330 mLs (11 oz total) by mouth 2 (two) times daily.   Farxiga  10 MG Tabs tablet Generic drug: dapagliflozin  propanediol Take 10 mg by mouth in the morning.   gabapentin  100 MG capsule Commonly known as: NEURONTIN  Take 100 mg by mouth at bedtime as needed (nerve pain.).   Gerhardt's butt cream Crea Apply 1 Application topically 2 (two) times daily.   levothyroxine  50 MCG tablet Commonly known as: SYNTHROID  Take 50 mcg by mouth daily at 2 am.   lidocaine  5 % Commonly known as: LIDODERM  Place 1 patch onto the skin daily. Remove & Discard patch within 12 hours or as directed by MD   melatonin 5 MG Tabs Take 1 tablet (5 mg total) by mouth at bedtime.   metFORMIN  500 MG 24 hr tablet Commonly known as: GLUCOPHAGE -XR Take 1 tablet (500 mg total) by mouth 2 (two) times daily with a meal. Replaces: metFORMIN  1000 MG tablet   metoprolol  succinate 100 MG 24 hr tablet Commonly known as: TOPROL -XL Take 100 mg by mouth in the morning. Take with or immediately following a meal.   midodrine  2.5 MG tablet Commonly known as: PROAMATINE  Take 1 tablet (2.5 mg total) by mouth 2 (two) times daily with a meal for 3 days. Hold if SBP >110   multivitamin with minerals Tabs tablet Take  1 tablet by mouth daily with lunch. Start taking on: November 05, 2023   pantoprazole  40 MG tablet Commonly known as: PROTONIX  Take 1 tablet (40 mg total) by mouth daily. Start taking on: November 05, 2023   rosuvastatin  40 MG tablet Commonly known as: CRESTOR  Take 20 mg by mouth at bedtime.   senna-docusate 8.6-50 MG tablet Commonly  known as: Senokot-S Take 1 tablet by mouth at bedtime as needed for moderate constipation.        Contact information for after-discharge care     Destination     [facility] Preferred SNF .   Service: Skilled Nursing Contact information: [address] [place]  [address] [phone]                    Discharge Exam: Filed Weights   11/01/23 0400 11/02/23 0359 11/03/23 1000  Weight: 115 kg 124.8 kg 123.2 kg   Physical Exam  Constitutional: In no distress.  Cardiovascular: Normal rate, regular rhythm. No lower extremity edema  Pulmonary: Non labored breathing on room air, no wheezing or rales. Abdominal: Soft. Normal bowel sounds. Mild distended and non tender Musculoskeletal: Normal range of motion.     Neurological: Alert and oriented to person, place, and time.BLE 4/5 strength in hips, knees, 5/5 with dorsiflexion and plantarflexion  Skin: Skin is warm and dry.    Condition at discharge: improving  The results of significant diagnostics from this hospitalization (including imaging, microbiology, ancillary and laboratory) are listed below for reference.   Imaging Studies: CT LUMBAR SPINE WO CONTRAST Result Date: 10/29/2023 CLINICAL DATA:  Generalized weakness with nausea and vomiting since recent back surgery 10/19/2023. EXAM: CT LUMBAR SPINE WITHOUT CONTRAST TECHNIQUE: Multidetector CT imaging of the lumbar spine was performed without intravenous contrast administration. Multiplanar CT image reconstructions were also generated. RADIATION DOSE REDUCTION: This exam was performed according  to the departmental dose-optimization program which includes automated exposure control, adjustment of the mA and/or kV according to patient size and/or use of iterative reconstruction technique. COMPARISON:  Abdominal radiographs 10/28/2023, abdominopelvic CT 10/25/2023, CT lumbar spine 05/12/2023 and lumbar MRI 09/07/2023. FINDINGS: Segmentation: Transitional lumbosacral anatomy. In keeping with previous imaging, there is a transitional, partially lumbarized S1 segment. Alignment: Minimal convex left scoliosis. No focal angulation or listhesis. Vertebrae: Interval posterior decompression on the left at L3-4 and L4-5. No evidence of acute fracture, pars defect or aggressive osseous lesion. No evidence of discitis/osteomyelitis. Paraspinal and other soft tissues: No unexpected paraspinal findings. Multiple loops of dilated fluid-filled small bowel are incompletely visualized, as seen on recent imaging and favored to reflect postoperative ileus. Disc levels: No acute disc space findings are identified. There is improved patency of the spinal canal at the L3-4 and L4-5 levels following left laminectomy. No evidence for epidural fluid collection. Grossly stable foraminal narrowing at both levels, worse on the left at L4-5. Chronic ankylosis across the L5-S1 disc and facet joints. IMPRESSION: 1. Interval posterior decompression on the left at L3-4 and L4-5 with improved patency of the spinal canal. No evidence of epidural fluid collection or other unexpected postoperative findings. 2. No acute osseous findings. 3. Multiple loops of dilated fluid-filled small bowel are incompletely visualized. These were favored to reflect postoperative ileus on recent imaging. Radiographic follow up recommended. Electronically Signed   By: Elsie Perone M.D.   On: 10/29/2023 16:43   DG Abd 1 View Result Date: 10/28/2023 CLINICAL DATA:  Ileus EXAM: ABDOMEN - 1 VIEW COMPARISON:  10/26/2023 FINDINGS: Similar appearance of distended  small bowel throughout the central abdomen, loops measuring up to 6.1 cm. Scattered gas present in the colon. No free air on supine radiographs. Right femoral vascular catheter. No acute osseous findings. IMPRESSION: Similar appearance of distended small bowel throughout the central abdomen, loops measuring up to 6.1 cm. Scattered gas present in the colon. Findings  are consistent with ileus or small-bowel obstruction. No free air on supine radiographs. Electronically Signed   By: Marolyn JONETTA Jaksch M.D.   On: 10/28/2023 08:19   US  RENAL Result Date: 10/27/2023 CLINICAL DATA:  Acute kidney injury. EXAM: RENAL / URINARY TRACT ULTRASOUND COMPLETE COMPARISON:  None Available. FINDINGS: Right Kidney: Renal measurements: 12.5 x 5.9 x 6.0 cm = volume: 229 ML. Echogenicity within normal limits. No hydronephrosis visualized. Upper pole kidney cyst measures 2.8 x 1.8 x 2.3 cm Left Kidney: Renal measurements: 13.8 x 6.1 x 7.1 cm = volume: 309.1 mL. Echogenicity within normal limits. No mass or hydronephrosis visualized. Bladder: Bladder decompressed around a Foley catheter. Other: None. IMPRESSION: 1. No acute findings. No hydronephrosis. 2. Right upper pole renal cyst. Electronically Signed   By: Waddell Calk M.D.   On: 10/27/2023 09:45   DG Abd 1 View Result Date: 10/26/2023 CLINICAL DATA:  Check gastric catheter placement EXAM: ABDOMEN - 1 VIEW COMPARISON:  None Available. FINDINGS: Gastric catheter is noted with the tip in the stomach. Stomach is distended with air. No free air is seen. No bony abnormality is noted. IMPRESSION: Gastric catheter within the stomach. Electronically Signed   By: Oneil Devonshire M.D.   On: 10/26/2023 18:42   US  EKG SITE RITE Result Date: 10/26/2023 If Site Rite image not attached, placement could not be confirmed due to current cardiac rhythm.  ECHOCARDIOGRAM COMPLETE Result Date: 10/26/2023    ECHOCARDIOGRAM REPORT   Patient Name:   Jason [name] Date of Exam: 10/26/2023 Medical Rec  #:  [id number]      Height:       76.0 in Accession #:    [id number]     Weight:       249.8 lb Date of Birth:  [birth year]       BSA:          2.435 m Patient Age:    72 years       BP:           134/57 mmHg Patient Gender: M              HR:           111 bpm. Exam Location:  [facility] Procedure: 2D Echo, Cardiac Doppler and Color Doppler Indications:     Shock  History:         Patient has no prior history of Echocardiogram examinations.                  CAD, Prior [name]; Risk Factors:Hypertension, Diabetes and                  Dyslipidemia. Shock.  Sonographer:     Naomie Reef Referring Phys:  [id number] LONELL KANDICE MOOSE Diagnosing Phys: Redell Cave MD  Sonographer Comments: Technically difficult study due to poor echo windows and no subcostal window. IMPRESSIONS  1. Left ventricular ejection fraction, by estimation, is 50 to 55%. Left ventricular ejection fraction by PLAX is 54 %. The left ventricle has low normal function. The left ventricle has no regional wall motion abnormalities. There is mild left ventricular hypertrophy. Left ventricular diastolic parameters are indeterminate.  2. Right ventricular systolic function is normal. The right ventricular size is normal.  3. The mitral valve is degenerative. No evidence of mitral valve regurgitation.  4. The aortic valve is tricuspid. Aortic valve regurgitation is not visualized. Aortic valve sclerosis is present, with no evidence of aortic valve stenosis.  5. Aortic dilatation  noted. There is borderline dilatation of the aortic root, measuring 38 mm. FINDINGS  Left Ventricle: Left ventricular ejection fraction, by estimation, is 50 to 55%. Left ventricular ejection fraction by PLAX is 54 %. The left ventricle has low normal function. The left ventricle has no regional wall motion abnormalities. The left ventricular internal cavity size was normal in size. There is mild left ventricular hypertrophy. Left ventricular diastolic parameters are indeterminate. Right  Ventricle: The right ventricular size is normal. No increase in right ventricular wall thickness. Right ventricular systolic function is normal. Left Atrium: Left atrial size was normal in size. Right Atrium: Right atrial size was normal in size. Pericardium: There is no evidence of pericardial effusion. Mitral Valve: The mitral valve is degenerative in appearance. No evidence of mitral valve regurgitation. MV peak gradient, 8.3 mmHg. The mean mitral valve gradient is 4.0 mmHg. Tricuspid Valve: The tricuspid valve is normal in structure. Tricuspid valve regurgitation is not demonstrated. Aortic Valve: The aortic valve is tricuspid. Aortic valve regurgitation is not visualized. Aortic valve sclerosis is present, with no evidence of aortic valve stenosis. Aortic valve mean gradient measures 4.3 mmHg. Aortic valve peak gradient measures 7.8  mmHg. Aortic valve area, by VTI measures 2.56 cm. Pulmonic Valve: The pulmonic valve was normal in structure. Pulmonic valve regurgitation is not visualized. Aorta: Aortic dilatation noted. There is borderline dilatation of the aortic root, measuring 38 mm. Venous: The inferior vena cava was not well visualized. IAS/Shunts: No atrial level shunt detected by color flow Doppler. Additional Comments: A device lead is visualized.  LEFT VENTRICLE PLAX 2D LV EF:         Left            Diastology                ventricular     LV e' lateral:   9.25 cm/s                ejection        LV E/e' lateral: 14.4                fraction by                PLAX is 54                %. LVIDd:         3.70 cm LVIDs:         2.70 cm LV PW:         1.10 cm LV IVS:        1.30 cm LVOT diam:     2.00 cm LV SV:         57 LV SV Index:   23 LVOT Area:     3.14 cm  RIGHT VENTRICLE RV Basal diam:  3.80 cm RV Mid diam:    3.40 cm RV S prime:     10.90 cm/s LEFT ATRIUM             Index        RIGHT ATRIUM           Index LA diam:        3.80 cm 1.56 cm/m   RA Area:     17.20 cm LA Vol (A2C):   50.4 ml  20.70 ml/m  RA Volume:   47.00 ml  19.30 ml/m LA Vol (A4C):   81.6 ml 33.52 ml/m LA Biplane Vol: 65.0 ml 26.70 ml/m  AORTIC VALVE                    PULMONIC VALVE AV Area (Vmax):    2.27 cm     PV Vmax:       1.09 m/s AV Area (Vmean):   2.06 cm     PV Peak grad:  4.8 mmHg AV Area (VTI):     2.56 cm AV Vmax:           140.00 cm/s AV Vmean:          98.233 cm/s AV VTI:            0.221 m AV Peak Grad:      7.8 mmHg AV Mean Grad:      4.3 mmHg LVOT Vmax:         101.00 cm/s LVOT Vmean:        64.400 cm/s LVOT VTI:          0.180 m LVOT/AV VTI ratio: 0.82  AORTA Ao Root diam: 3.80 cm MITRAL VALVE MV Area (PHT): 7.59 cm     SHUNTS MV Area VTI:   2.58 cm     Systemic VTI:  0.18 m MV Peak grad:  8.3 mmHg     Systemic Diam: 2.00 cm MV Mean grad:  4.0 mmHg MV Vmax:       1.44 m/s MV Vmean:      88.0 cm/s MV Decel Time: 100 msec MV E velocity: 133.00 cm/s Redell Cave MD Electronically signed by Redell Cave MD Signature Date/Time: 10/26/2023/3:06:10 PM    Final    DG Abd 1 View Result Date: 10/26/2023 CLINICAL DATA:  Ileus EXAM: ABDOMEN - 1 VIEW COMPARISON:  10/25/2023; 10/24/2023; CT abdomen pelvis-10/25/2023 FINDINGS: Malpositioned nasogastric tube with tip and side port projected over the right lower lobe bronchus. Marked gaseous distention of the stomach as well as several loops of patulous appearing small bowel with index loop of small bowel within the right lower abdominal quadrant measuring 6.3 cm in diameter. The site of the associated with moderate gaseous distention of the colon. No pneumoperitoneum, pneumatosis or portal venous gas Limited visualization of the lower thorax demonstrates sequela of previous median sternotomy. Pacer leads terminate over the right atrium and ventricle. Postcholecystectomy. 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 IMPRESSION: 1. Malpositioned nasogastric tube with tip and side port  projected over the right lower lobe bronchus. Repositioning is advised. 2. Marked gaseous distention of the stomach as well as several loops of patulous appearing small bowel. While distal colonic gaseous identified, partial small bowel obstruction is not excluded on the basis of this examination. Continued attention on follow-up is advised. These results will be called to the ordering clinician or representative by the Radiologist Assistant, and communication documented in the PACS or [employer]. Electronically Signed   By: Norleen Roulette M.D.   On: 10/26/2023 14:55   US  Venous Img Lower Bilateral (DVT) Result Date: 10/26/2023 CLINICAL DATA:  Bilateral lower extremity pain and edema. Evaluate for DVT. EXAM: BILATERAL LOWER EXTREMITY VENOUS DOPPLER ULTRASOUND TECHNIQUE: Gray-scale sonography with graded compression, as well as color Doppler and duplex ultrasound were performed to evaluate the lower extremity deep venous systems from the level of the common femoral vein and including the common femoral, femoral, profunda femoral, popliteal and calf veins including the posterior tibial, peroneal and gastrocnemius veins when visible. The superficial great saphenous vein was also interrogated. Spectral  Doppler was utilized to evaluate flow at rest and with distal augmentation maneuvers in the common femoral, femoral and popliteal veins. COMPARISON:  None Available. FINDINGS: RIGHT LOWER EXTREMITY Common Femoral Vein: No evidence of thrombus. Normal compressibility, respiratory phasicity and response to augmentation. Saphenofemoral Junction: No evidence of thrombus. Normal compressibility and flow on color Doppler imaging. Profunda Femoral Vein: No evidence of thrombus. Normal compressibility and flow on color Doppler imaging. Femoral Vein: No evidence of thrombus. Normal compressibility, respiratory phasicity and response to augmentation. Popliteal Vein: No evidence of thrombus. Normal compressibility,  respiratory phasicity and response to augmentation. Calf Veins: No evidence of thrombus. Normal compressibility and flow on color Doppler imaging. Superficial Great Saphenous Vein: No evidence of thrombus. Normal compressibility. Other Findings:  None. LEFT LOWER EXTREMITY Common Femoral Vein: No evidence of thrombus. Normal compressibility, respiratory phasicity and response to augmentation. Saphenofemoral Junction: No evidence of thrombus. Normal compressibility and flow on color Doppler imaging. Profunda Femoral Vein: No evidence of thrombus. Normal compressibility and flow on color Doppler imaging. Femoral Vein: No evidence of thrombus. Normal compressibility, respiratory phasicity and response to augmentation. Popliteal Vein: No evidence of thrombus. Normal compressibility, respiratory phasicity and response to augmentation. Calf Veins: No evidence of thrombus. Normal compressibility and flow on color Doppler imaging. Superficial Great Saphenous Vein: No evidence of thrombus. Normal compressibility. Other Findings:  None. IMPRESSION: No evidence of DVT within either lower extremity. Electronically Signed   By: Norleen Roulette M.D.   On: 10/26/2023 14:51   DG Abd 1 View Result Date: 10/25/2023 CLINICAL DATA:  Encounter for nasogastric tube placement EXAM: ABDOMEN - 1 VIEW COMPARISON:  None Available. FINDINGS: NG tube coiled within the gastric fundus. Side port is below the GE junction. Dilated loops of small bowel again noted. No significant improvement. LEFT basilar atelectasis. IMPRESSION: 1. NG tube in stomach. 2. Dilated loops of gas-filled small bowel. Electronically Signed   By: Jackquline Boxer M.D.   On: 10/25/2023 15:58   CT ABDOMEN PELVIS WO CONTRAST Result Date: 10/25/2023 CLINICAL DATA:  Peritonitis or perforation suspected Bowel obstruction suspected EXAM: CT ABDOMEN AND PELVIS WITHOUT CONTRAST TECHNIQUE: Multidetector CT imaging of the abdomen and pelvis was performed following the standard  protocol without IV contrast. RADIATION DOSE REDUCTION: This exam was performed according to the departmental dose-optimization program which includes automated exposure control, adjustment of the mA and/or kV according to patient size and/or use of iterative reconstruction technique. COMPARISON:  Noncontrast CT yesterday FINDINGS: Lower chest: The heart is enlarged. Progressive atelectasis in the medial left greater than right lower lobes. Bilateral pleural thickening. Hepatobiliary: Calcified granuloma in the liver. No evidence of focal liver lesion on this unenhanced exam. Cholecystectomy. Stable biliary tree. Pancreas: No ductal dilatation or inflammation. Spleen: Normal in size without focal abnormality. Adrenals/Urinary Tract: No adrenal nodule. No hydronephrosis. Simple bilateral renal cysts. No further follow-up imaging is recommended. Unremarkable urinary bladder. Stomach/Bowel: Enteric tube tip in the stomach. Small amount of intraluminal gastric fluid, decreased gastric distension. Again seen dilated fluid-filled small bowel no discrete small bowel transition point. The appendix is normal. Gaseous distention of the cecum. Liquid stool in the ascending colon. Non dependent air in the ascending colon is likely intraluminal, there is no dependent air to suggest pneumatosis. Transverse colon is dilated to the splenic flexure. No evidence of bowel wall thickening or focal bowel abnormality at the splenic flexure in the region of transition. There is formed stool in the more distal colon. Vascular/Lymphatic: Aortic atherosclerosis without aneurysm. There is  no portal venous or mesenteric gas. No bulky adenopathy. Right femoral arterial catheter is in the right external iliac artery. There is a right femoral venous catheter with tip in the common femoral vein. Reproductive: Enlarged prostate, 5.4 cm. Other: No free air. No significant ascites. Moderate-sized fat containing umbilical hernia. Musculoskeletal:  Stable osseous structures. Recent postsurgical change in the lumbar spine. IMPRESSION: 1. Findings favoring ileus with persistent air and fluid distension of the small bowel, ascending, and transverse. There is no discrete transition point. Enteric tube decompresses the stomach with diminished gastric distension from yesterday. 2. No free air or mesenteric gas. 3. Progressive atelectasis in the medial left greater than right lower lobes. 4. Moderate-sized fat containing umbilical hernia. Aortic Atherosclerosis (ICD10-I70.0). Electronically Signed   By: Andrea Gasman M.D.   On: 10/25/2023 03:23   CT HEAD WO CONTRAST ( ) Result Date: 10/25/2023 CLINICAL DATA:  Acute neurologic deficit EXAM: CT HEAD WITHOUT CONTRAST TECHNIQUE: Contiguous axial images were obtained from the base of the skull through the vertex without intravenous contrast. RADIATION DOSE REDUCTION: This exam was performed according to the departmental dose-optimization program which includes automated exposure control, adjustment of the mA and/or kV according to patient size and/or use of iterative reconstruction technique. COMPARISON:  None Available. FINDINGS: Brain: No mass,hemorrhage or extra-axial collection. Normal appearance of the parenchyma and CSF spaces. Vascular: Atherosclerotic calcification of the vertebral and internal carotid arteries at the skull base. No abnormal hyperdensity of the major intracranial arteries or dural venous sinuses. Skull: The visualized skull base, calvarium and extracranial soft tissues are normal. Sinuses/Orbits: No fluid levels or advanced mucosal thickening of the visualized paranasal sinuses. No mastoid or middle ear effusion. Normal orbits. Other: None. IMPRESSION: No acute intracranial abnormality. Electronically Signed   By: Franky Stanford M.D.   On: 10/25/2023 03:17   DG Abd 1 View Result Date: 10/24/2023 CLINICAL DATA:  NGT placement EXAM: ABDOMEN - 1 VIEW COMPARISON:  None Available. FINDINGS:  Limited study that includes the upper abdomen and lower chest demonstrates an NG tube superimposed with the stomach which is mildly distended. There is gaseous distention of small and large bowel. IMPRESSION: NG tube in the stomach. Gaseous distention of bowel. Electronically Signed   By: Fonda Field M.D.   On: 10/24/2023 16:07   DG Chest Portable 1 View Result Date: 10/24/2023 CLINICAL DATA:  Weakness, vomiting EXAM: PORTABLE CHEST 1 VIEW COMPARISON:  09/07/2023 FINDINGS: Left-sided implanted cardiac device remains in place. 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 Minor left basilar atelectasis. Lungs are otherwise clear. No pleural effusion or pneumothorax. Gaseous distension of bowel within the included upper abdomen. IMPRESSION: 1. Minor left basilar atelectasis. Otherwise no acute cardiopulmonary findings. 2. Gaseous distension of bowel within the included upper abdomen. Electronically Signed   By: Mabel Converse D.O.   On: 10/24/2023 12:38   CT ABDOMEN PELVIS WO CONTRAST Result Date: 10/24/2023 CLINICAL DATA:  73 year old male with abdominal pain and weakness. Recent spine surgery. Nausea vomiting. EXAM: CT ABDOMEN AND PELVIS WITHOUT CONTRAST TECHNIQUE: Multidetector CT imaging of the abdomen and pelvis was performed following the standard protocol without IV contrast. RADIATION DOSE REDUCTION: This exam was performed according to the departmental dose-optimization program which includes automated exposure control, adjustment of the mA and/or kV according to patient size and/or use of iterative reconstruction technique. COMPARISON:  Intraoperative lumbar radiographs 10/18/2023. Lumbar spine CT 05/12/2023. FINDINGS: Lower chest: Previous sternotomy. Cardiac pacer leads. No cardiomegaly or pericardial effusion. Elevated left hemidiaphragm, confluent left  lung base atelectasis. Linear, patchy right lung base atelectasis. No pericardial effusion. Hepatobiliary: Cholecystectomy. Negative  noncontrast liver with punctate calcified granulomas. Pancreas: Negative noncontrast pancreas. Spleen: Negative. Adrenals/Urinary Tract: Normal adrenal glands. Nonobstructed kidneys. Simple fluid renal cysts (no follow-up imaging recommended). Decompressed ureters. Decompressed bladder. Pelvic phleboliths. Stomach/Bowel: Low-density retained stool throughout the distal colon, dilated gas-filled right colon and transverse colon. Mild retained fluid in the right colon. Appendix remains normal on series 5, image 51. Redundant right colon with gas-filled cecum up to 9 cm diameter. Transverse colon is 5.5 cm diameter and the splenic flexure tapers with no transition point or obstructing etiology. Descending and sigmoid colon mostly decompressed. Terminal ileum is relatively decompressed on series 2, image 40. But just upstream of the TI these distal small bowel becomes mildly dilated and gas-filled up to 3.8 cm. Farther upstream gas and fluid distended small bowel loops are 4.5 cm. The ligament of Treitz is dilated up to 5 cm. The stomach and proximal duodenum are dilated. The distal esophagus is decompressed. The distal duodenum is decompressed. Fat containing umbilical hernia. No herniated bowel. No free air or free fluid. Vascular/Lymphatic: Extensive Aortoiliac calcified atherosclerosis. Normal caliber abdominal aorta. Vascular patency is not evaluated in the absence of IV contrast. No lymphadenopathy. Reproductive: Negative. Other: No pelvis free fluid. Musculoskeletal: Recent postoperative changes to the posterior lumbar spine. Underlying chronic lumbar spine degeneration. No unexpected features. IMPRESSION: 1. Dilated stomach, small bowel, and proximal colon with tapered bowel at the splenic flexure, no transition point or obstructing etiology. Favor generalized Ileus. NG tube decompression may be valuable. 2. Normal appendix.  No free air or free fluid. 3. Recent postoperative changes to the lumbar spine. 4. Lung  base atelectasis. 5.  Aortic Atherosclerosis (ICD10-I70.0). Electronically Signed   By: VEAR Hurst M.D.   On: 10/24/2023 12:25   DG Lumbar Spine 2-3 Views Result Date: 10/18/2023 CLINICAL DATA:  Elective surgery. EXAM: LUMBAR SPINE - 2-3 VIEW COMPARISON:  Lumbar MRI 09/07/2023 FINDINGS: Two portable cross-table views of the lumbar spine obtained in the operating room. Patient has transitional lumbosacral anatomy. Film 1 demonstrates surgical instruments posteriorly at the L4-L5 level. Film 2 demonstrates surgical instruments at the L4-L5 level. IMPRESSION: Cross-table views of the lumbar spine for surgical localization. Electronically Signed   By: Andrea Gasman M.D.   On: 10/18/2023 15:15    Microbiology: Results for orders placed or performed during the hospital encounter of 10/24/23  Blood culture (routine x 2)     Status: None   Collection Time: 10/24/23  1:14 PM   Specimen: BLOOD  Result Value Ref Range Status   Specimen Description BLOOD BLOOD LEFT HAND  Final   Special Requests   Final    BOTTLES DRAWN AEROBIC AND ANAEROBIC Blood Culture adequate volume   Culture   Final    NO GROWTH 5 DAYS Performed at [facility], [address] [place], [place] [address]    Report Status 10/29/2023 FINAL  Final  Blood culture (routine x 2)     Status: None   Collection Time: 10/24/23  8:20 PM   Specimen: BLOOD LEFT HAND  Result Value Ref Range Status   Specimen Description BLOOD LEFT HAND  Final   Special Requests   Final    BOTTLES DRAWN AEROBIC AND ANAEROBIC Blood Culture results may not be optimal due to an inadequate volume of blood received in culture bottles   Culture   Final    NO GROWTH 5 DAYS Performed at  [facility], [address], [place], [place] [address]    Report Status 10/29/2023 FINAL  Final  MRSA Next Gen by PCR, Nasal     Status: None   Collection Time: 10/25/23  2:05 AM   Specimen: Nasal Mucosa; Nasal Swab  Result Value Ref Range Status   MRSA  by PCR Next Gen NOT DETECTED NOT DETECTED Final    Comment: (NOTE) The GeneXpert MRSA Assay (FDA approved for NASAL specimens only), is one component of a comprehensive MRSA colonization surveillance program. It is not intended to diagnose MRSA infection nor to guide or monitor treatment for MRSA infections. Test performance is not FDA approved in patients less than 69 years old. Performed at [facility], [address] [place], [place] [address]   SARS Coronavirus 2 by RT PCR (hospital order, performed in [facility] hospital lab) *cepheid single result test* Anterior Nasal Swab     Status: None   Collection Time: 10/25/23  8:37 AM   Specimen: Anterior Nasal Swab  Result Value Ref Range Status   SARS Coronavirus 2 by RT PCR NEGATIVE NEGATIVE Final    Comment: (NOTE) SARS-CoV-2 target nucleic acids are NOT DETECTED.  The SARS-CoV-2 RNA is generally detectable in upper and lower respiratory specimens during the acute phase of infection. The lowest concentration of SARS-CoV-2 viral copies this assay can detect is 250 copies / mL. A negative result does not preclude SARS-CoV-2 infection and should not be used as the sole basis for treatment or other patient management decisions.  A negative result may occur with improper specimen collection / handling, submission of specimen other than nasopharyngeal swab, presence of viral mutation(s) within the areas targeted by this assay, and inadequate number of viral copies (<250 copies / mL). A negative result must be combined with clinical observations, patient history, and epidemiological information.  Fact Sheet for Patients:   roadlaptop.co.za  Fact Sheet for Healthcare Providers: http://kim-miller.com/  This test is not yet approved or  cleared by the [place]  FDA and has been authorized for detection and/or diagnosis of SARS-CoV-2 by FDA under an Emergency Use Authorization  (EUA).  This EUA will remain in effect (meaning this test can be used) for the duration of the COVID-19 declaration under Section 564(b)(1) of the Act, 21 U.S.C. section 360bbb-3(b)(1), unless the authorization is terminated or revoked sooner.  Performed at [facility], [address] [place], [place] [address]   Respiratory (~20 pathogens) panel by PCR     Status: None   Collection Time: 10/25/23  8:37 AM   Specimen: Nasopharyngeal Swab; Respiratory  Result Value Ref Range Status   Adenovirus NOT DETECTED NOT DETECTED Final   Coronavirus 229E NOT DETECTED NOT DETECTED Final    Comment: (NOTE) The Coronavirus on the Respiratory Panel, DOES NOT test for the novel  Coronavirus (2019 nCoV)    Coronavirus HKU1 NOT DETECTED NOT DETECTED Final   Coronavirus NL63 NOT DETECTED NOT DETECTED Final   Coronavirus OC43 NOT DETECTED NOT DETECTED Final   Metapneumovirus NOT DETECTED NOT DETECTED Final   Rhinovirus / Enterovirus NOT DETECTED NOT DETECTED Final   Influenza A NOT DETECTED NOT DETECTED Final   Influenza B NOT DETECTED NOT DETECTED Final   Parainfluenza Virus 1 NOT DETECTED NOT DETECTED Final   Parainfluenza Virus 2 NOT DETECTED NOT DETECTED Final   Parainfluenza Virus 3 NOT DETECTED NOT DETECTED Final   Parainfluenza Virus 4 NOT DETECTED NOT DETECTED Final   Respiratory Syncytial Virus NOT  DETECTED NOT DETECTED Final   Bordetella pertussis NOT DETECTED NOT DETECTED Final   Bordetella Parapertussis NOT DETECTED NOT DETECTED Final   Chlamydophila pneumoniae NOT DETECTED NOT DETECTED Final   Mycoplasma pneumoniae NOT DETECTED NOT DETECTED Final    Comment: Performed at [facility], [address] [place], [place] [address]    Labs: CBC: Recent Labs  Lab 10/29/23 [phone] 10/30/23 0407 10/31/23 0514 11/02/23 0535 11/04/23 0445  WBC 9.5 9.6 7.4 7.2 8.2  NEUTROABS  --   --  4.8  --   --   HGB 11.3* 11.2* 11.1* 10.2* 10.9*  HCT 32.6* 33.6* 32.2* 30.5* 32.7*   MCV 89.3 91.1 89.7 92.1 90.8  PLT 129* 105* 119* 141* 173   Basic Metabolic Panel: Recent Labs  Lab 10/29/23 0412 10/30/23 0407 10/31/23 0514 11/02/23 0535 11/03/23 1017 11/04/23 0445  NA 142 140 138 137 140 138  K 3.6 3.3* 3.6 3.4* 3.7 3.8  CL 111 109 107 106 108 106  CO2 21* 21* 21* 24 22 23   GLUCOSE 177* 140* 161* 174* 202* 185*  BUN 93* 80* 62* 36* 30* 30*  CREATININE 3.44* 2.79* 2.46* 1.85* 1.65* 1.62*  CALCIUM  8.3* 8.3* 8.5* 8.9 9.0 8.9  MG 2.0  --  1.6* 1.5* 1.7 2.0  PHOS 3.0  --  3.5 3.1  --  3.1   Liver Function Tests: Recent Labs  Lab 10/30/23 1216  AST 23  ALT 36  ALKPHOS 38  BILITOT 0.5  PROT 5.0*  ALBUMIN  2.2*   CBG: Recent Labs  Lab 11/03/23 1126 11/03/23 1643 11/03/23 2224 11/04/23 0838 11/04/23 1119  GLUCAP 196* 193* 174* 184* 204*    Discharge time spent: less than 30 minutes.  Signed: Alban Pepper, MD [facility] 11/04/2023

## 2023-11-04 NOTE — Plan of Care (Signed)

## 2023-11-04 NOTE — Progress Notes (Signed)
 Mobility Specialist - Progress Note     11/04/23 1618  Mobility  Activity Stood at bedside;Transferred to/from [facility]  Level of Assistance Minimal assist, patient does 75% or more  Assistive Device Four wheel walker  Distance Ambulated (ft) 3 ft  Range of Motion/Exercises Active  Activity Response Tolerated well  Mobility Referral Yes  Mobility visit 1 Mobility  Mobility Specialist Start Time (ACUTE ONLY) 1600  Mobility Specialist Stop Time (ACUTE ONLY) 1622  Mobility Specialist Time Calculation (min) (ACUTE ONLY) 22 min   Pt resting in recliner on RA upon entry. Pt STS Min/ModA and transferred +2 assist to [facility]. Pt RLE manually adjusted toward him before standing. Pt given manual and verbal cuing during ambulation for patient safety (keeping the walker close, reaching back toward bed). Pt transferred to bed MinA and left with needs in reach. Bed alarm activated.   Guido Rumble Mobility Specialist 11/04/23, 4:27 PM

## 2023-11-04 NOTE — TOC Progression Note (Signed)
 Transition of Care [facility]) - Progression Note    Patient Details  Name: Jason [name] MRN: [id number] Date of Birth: [birth year]  [facility] CM/SW Contact  Corean ONEIDA Haddock, RN Phone Number: 11/04/2023, 11:26 AM  Clinical Narrative:     Met with patient and wife Jason [name] at bedside Patient in agreement for rehab Pasrr obtained Fl2 sent for signature Bed search initiated        Expected Discharge Plan and Services                                               Social Determinants of Health (SDOH) Interventions SDOH Screenings   Food Insecurity: No Food Insecurity (10/24/2023)  Housing: Low Risk  (10/24/2023)  Transportation Needs: No Transportation Needs (10/24/2023)  Utilities: Not At Risk (10/24/2023)  Financial Resource Strain: Low Risk  (08/02/2018)  Physical Activity: Unknown (08/02/2018)  Social Connections: Patient Declined (11/01/2023)  Stress: No Stress Concern Present (08/02/2018)  Tobacco Use: High Risk (10/24/2023)    Readmission Risk Interventions     No data to display

## 2023-11-04 NOTE — TOC Transition Note (Signed)
 Transition of Care [facility]) - Discharge Note   Patient Details  Name: Jason [name] MRN: [id number] Date of Birth: Jan 26, 1951  Transition of Care [facility]) CM/SW Contact:  Corean ONEIDA Haddock, RN Phone Number: 11/04/2023, 4:47 PM   Clinical Narrative:    [facility] Anticipated DC date: 11/04/23  Family notified: wife Transport by: ALBERTO  Per MD patient ready for DC to . RN, patient, patient's family, and facility notified of DC. Discharge Summary sent to facility. RN given number for report. DC packet on chart. Ambulance transport requested for patient.  TOC signing off.    Final next level of care: Skilled Nursing Facility Barriers to Discharge: No Barriers Identified   Patient Goals and CMS Choice Patient states their goals for this hospitalization and ongoing recovery are:: to walk again CMS Medicare.gov Compare Post Acute Care list provided to:: Patient Choice offered to / list presented to : Patient      Discharge Placement              Patient chooses bed at: [facility] Patient to be transferred to facility by: [facility] Name of family member notified: Laiken Nohr, [phone] Patient and family notified of of transfer: 11/04/23  Discharge Plan and Services Additional resources added to the After Visit Summary for                                       Social Drivers of Health (SDOH) Interventions SDOH Screenings   Food Insecurity: No Food Insecurity (10/24/2023)  Housing: Low Risk  (10/24/2023)  Transportation Needs: No Transportation Needs (10/24/2023)  Utilities: Not At Risk (10/24/2023)  Financial Resource Strain: Low Risk  (08/02/2018)  Physical Activity: Unknown (08/02/2018)  Social Connections: Patient Declined (11/01/2023)  Stress: No Stress Concern Present (08/02/2018)  Tobacco Use: High Risk (10/24/2023)     Readmission Risk Interventions     No data to display

## 2023-11-04 NOTE — Progress Notes (Signed)
 Physical Therapy Treatment Patient Details Name: Jason [name] MRN: [id number] DOB: 04-07-1951 Today's Date: 11/04/2023   History of Present Illness Jason [name] is a 73 year old male with history of CAD status post four-vessel [name] in 2017, hypertension, hyperlipidemia, non-insulin -dependent diabetes mellitus, history of lumbar stenosis status post spinal stenosis surgery on 10/18/2023, who presents emergency department for chief concerns of generalized weakness since being discharged post spinal surgery, and intractable nausea and vomiting. Patient found to have ileus.    PT Comments  Patient agreeable to PT treatment session this date. Patient seems to be moving from previous PT session. Able to stand from slightly elevated bed with modA+2 and RW. Able to take steps towards recliner with minA+2 and RW with assist primarily for RW management and balance. Patient is making progress towards physical therapy goals with progression to utilizing RW this date versus lift equipment. Continue to recommend nursing staff to utilize lift equipment for safety with transfers. Discharge plan remains appropriate.     If plan is discharge home, recommend the following: Two people to help with walking and/or transfers;A lot of help with bathing/dressing/bathroom   Can travel by private vehicle     No  Equipment Recommendations  Other (comment) (TBD)    Recommendations for Other Services       Precautions / Restrictions Precautions Precautions: Back;Fall Precaution Comments: recent lumbar surgery, no brace needed Restrictions Weight Bearing Restrictions Per Provider Order: No     Mobility  Bed Mobility Overal bed mobility: Needs Assistance Bed Mobility: Rolling, Sidelying to Sit Rolling: Min assist Sidelying to sit: Mod assist       General bed mobility comments: cues for hand placement and step by step technique    Transfers Overall transfer level: Needs assistance Equipment used:  Rolling Jason [name] (2 wheels) Transfers: Sit to/from Stand Sit to Stand: Mod assist, +2 physical assistance, +2 safety/equipment, From elevated surface   Step pivot transfers: Min assist, +2 physical assistance, +2 safety/equipment       General transfer comment: cues for proper hand placement prior to standing. ModA+2 to stand from EOB and able to take steps towards recliner with cueing and assist for RW management and balance    Ambulation/Gait                   Stairs             Wheelchair Mobility     Tilt Bed    Modified Rankin (Stroke Patients Only)       Balance Overall balance assessment: Needs assistance Sitting-balance support: Feet supported Sitting balance-Leahy Scale: Good     Standing balance support: Bilateral upper extremity supported Standing balance-Leahy Scale: Poor                              Cognition Arousal: Alert Behavior During Therapy: WFL for tasks assessed/performed Overall Cognitive Status: No family/caregiver present to determine baseline cognitive functioning                                 General Comments: cognition seems improved from previous sessions. Follows commands well with increased time but tangential and perseverates on current status and rehab potential        Exercises      General Comments        Pertinent Vitals/Pain Pain Assessment Pain Assessment: Faces Faces Pain Scale:  Hurts little more Pain Location: R knee Pain Descriptors / Indicators: Discomfort, Grimacing Pain Intervention(s): Limited activity within patient's tolerance, Monitored during session, Repositioned    Home Living                          Prior Function            PT Goals (current goals can now be found in the care plan section) Acute Rehab PT Goals Patient Stated Goal: to get stronger PT Goal Formulation: With patient Time For Goal Achievement: 11/11/23 Potential to Achieve Goals:  Fair Progress towards PT goals: Progressing toward goals    Frequency    Min 1X/week      PT Plan      Co-evaluation              AM-PAC PT 6 Clicks Mobility   Outcome Measure  Help needed turning from your back to your side while in a flat bed without using bedrails?: A Lot Help needed moving from lying on your back to sitting on the side of a flat bed without using bedrails?: A Lot Help needed moving to and from a bed to a chair (including a wheelchair)?: Total Help needed standing up from a chair using your arms (e.g., wheelchair or bedside chair)?: Total Help needed to walk in hospital room?: Total Help needed climbing 3-5 steps with a railing? : Total 6 Click Score: 8    End of Session Equipment Utilized During Treatment: Gait belt Activity Tolerance: Patient tolerated treatment well Patient left: in chair;with call bell/phone within reach Nurse Communication: Mobility status;Need for lift equipment PT Visit Diagnosis: Other abnormalities of gait and mobility (R26.89);Muscle weakness (generalized) (M62.81);Pain Pain - Right/Left: Right Pain - part of body: Knee     Time: 1501-1509 PT Time Calculation (min) (ACUTE ONLY): 8 min  Charges:    $Therapeutic Activity: 8-22 mins PT General Charges $$ ACUTE PT VISIT: 1 Visit                     Jason [name], PT, DPT Physical Therapist - [facility]  [facility]    Jason [name] 11/04/2023, 3:23 PM

## 2023-11-04 NOTE — Progress Notes (Signed)
 Occupational Therapy Treatment Patient Details Name: Jason [name] MRN: [id number] DOB: 25-Jul-1951 Today's Date: 11/04/2023   History of present illness [name] is a 73 year old male with history of CAD status post four-vessel [name] in 2017, hypertension, hyperlipidemia, non-insulin -dependent diabetes mellitus, history of lumbar stenosis status post spinal stenosis surgery on 10/18/2023, who presents emergency department for chief concerns of generalized weakness since being discharged post spinal surgery, and intractable nausea and vomiting. Patient found to have ileus.   OT comments  Mr [name] was seen for OT treatment on this date. Upon arrival to room pt in bed, agreeable to tx. Pt requires MOD A x2 + RW sit<>stand, MAX A don depends, +2 for balance to pull up in standing. MIN A x2 + RW bed>chair t/f. Pt making good progress toward goals, will continue to follow POC. Discharge recommendation remains appropriate.       If plan is discharge home, recommend the following:  A lot of help with bathing/dressing/bathroom;A lot of help with walking and/or transfers;Assistance with cooking/housework;Assist for transportation;Help with stairs or ramp for entrance   Equipment Recommendations  Other (comment) (defer)    Recommendations for Other Services      Precautions / Restrictions Precautions Precautions: Back;Fall Precaution Comments: recent lumbar surgery, no brace needed Restrictions Weight Bearing Restrictions Per Provider Order: No       Mobility Bed Mobility Overal bed mobility: Needs Assistance Bed Mobility: Rolling, Sidelying to Sit Rolling: Min assist Sidelying to sit: Mod assist            Transfers Overall transfer level: Needs assistance Equipment used: Rolling walker (2 wheels) Transfers: Sit to/from Stand Sit to Stand: Mod assist, +2 physical assistance, +2 safety/equipment, From elevated surface     Step pivot transfers: Min assist, +2 physical  assistance, +2 safety/equipment           Balance Overall balance assessment: Needs assistance Sitting-balance support: Feet supported Sitting balance-Leahy Scale: Good     Standing balance support: Bilateral upper extremity supported Standing balance-Leahy Scale: Poor                             ADL either performed or assessed with clinical judgement   ADL Overall ADL's : Needs assistance/impaired                                       General ADL Comments: MAX A don depends, +2 for balance to pull up in standing. MOD A x2 + RW for simulated BSC t/f      Cognition Arousal: Alert Behavior During Therapy: WFL for tasks assessed/performed Overall Cognitive Status: No family/caregiver present to determine baseline cognitive functioning                                 General Comments: follows commands with time, tangential speech                   Pertinent Vitals/ Pain       Pain Assessment Pain Assessment: Faces Faces Pain Scale: Hurts little more Pain Location: R knee Pain Descriptors / Indicators: Discomfort, Grimacing Pain Intervention(s): Limited activity within patient's tolerance, Repositioned   Frequency  Min 1X/week        Progress Toward Goals  OT Goals(current goals can now  be found in the care plan section)  Progress towards OT goals: Progressing toward goals  Acute Rehab OT Goals OT Goal Formulation: With patient Time For Goal Achievement: 11/11/23 Potential to Achieve Goals: Good ADL Goals Pt Will Perform Grooming: with modified independence;sitting Pt Will Perform Lower Body Dressing: with min assist;sitting/lateral leans Pt Will Transfer to Toilet: with modified independence;ambulating Pt Will Perform Toileting - Clothing Manipulation and hygiene: with modified independence;sit to/from stand  Plan      Co-evaluation                 AM-PAC OT 6 Clicks Daily Activity     Outcome  Measure   Help from another person eating meals?: A Little Help from another person taking care of personal grooming?: A Little Help from another person toileting, which includes using toliet, bedpan, or urinal?: A Lot Help from another person bathing (including washing, rinsing, drying)?: A Lot Help from another person to put on and taking off regular upper body clothing?: A Lot Help from another person to put on and taking off regular lower body clothing?: A Lot 6 Click Score: 14    End of Session Equipment Utilized During Treatment: Gait belt;Rolling walker (2 wheels)  OT Visit Diagnosis: Other abnormalities of gait and mobility (R26.89);Muscle weakness (generalized) (M62.81);Unsteadiness on feet (R26.81)   Activity Tolerance Patient tolerated treatment well   Patient Left in chair;with call bell/phone within reach   Nurse Communication Mobility status        Time: 1452-1500 OT Time Calculation (min): 8 min  Charges: OT General Charges $OT Visit: 1 Visit OT Treatments $Self Care/Home Management : 8-22 mins  Elston Slot, M.S. OTR/L  11/04/23, 3:55 PM  ascom [phone]

## 2023-11-04 NOTE — Discharge Instructions (Signed)
 Please follow up with primary care doctor and cardiologist to decide when to resume entresto and torsemide.

## 2023-12-08 ENCOUNTER — Encounter: Payer: Self-pay | Admitting: Emergency Medicine

## 2023-12-08 ENCOUNTER — Other Ambulatory Visit: Payer: Self-pay

## 2023-12-08 ENCOUNTER — Inpatient Hospital Stay
Admission: EM | Admit: 2023-12-08 | Discharge: 2023-12-12 | DRG: 871 | Disposition: A | Discharge status: Home Health | Payer: Medicare Other | Attending: Student | Admitting: Student

## 2023-12-08 DIAGNOSIS — I480 Paroxysmal atrial fibrillation: Secondary | ICD-10-CM | POA: Diagnosis present

## 2023-12-08 DIAGNOSIS — I252 Old myocardial infarction: Secondary | ICD-10-CM

## 2023-12-08 DIAGNOSIS — Z8249 Family history of ischemic heart disease and other diseases of the circulatory system: Secondary | ICD-10-CM

## 2023-12-08 DIAGNOSIS — R053 Chronic cough: Secondary | ICD-10-CM | POA: Diagnosis present

## 2023-12-08 DIAGNOSIS — E1165 Type 2 diabetes mellitus with hyperglycemia: Secondary | ICD-10-CM | POA: Diagnosis not present

## 2023-12-08 DIAGNOSIS — E11649 Type 2 diabetes mellitus with hypoglycemia without coma: Principal | ICD-10-CM | POA: Diagnosis present

## 2023-12-08 DIAGNOSIS — E039 Hypothyroidism, unspecified: Secondary | ICD-10-CM | POA: Diagnosis present

## 2023-12-08 DIAGNOSIS — A419 Sepsis, unspecified organism: Principal | ICD-10-CM | POA: Diagnosis present

## 2023-12-08 DIAGNOSIS — I2510 Atherosclerotic heart disease of native coronary artery without angina pectoris: Secondary | ICD-10-CM | POA: Diagnosis present

## 2023-12-08 DIAGNOSIS — R6521 Severe sepsis with septic shock: Secondary | ICD-10-CM | POA: Diagnosis present

## 2023-12-08 DIAGNOSIS — I5032 Chronic diastolic (congestive) heart failure: Secondary | ICD-10-CM | POA: Diagnosis present

## 2023-12-08 DIAGNOSIS — R6520 Severe sepsis without septic shock: Secondary | ICD-10-CM | POA: Diagnosis present

## 2023-12-08 DIAGNOSIS — I110 Hypertensive heart disease with heart failure: Secondary | ICD-10-CM | POA: Diagnosis present

## 2023-12-08 DIAGNOSIS — Z880 Allergy status to penicillin: Secondary | ICD-10-CM

## 2023-12-08 DIAGNOSIS — N3090 Cystitis, unspecified without hematuria: Secondary | ICD-10-CM

## 2023-12-08 DIAGNOSIS — Z79899 Other long term (current) drug therapy: Secondary | ICD-10-CM

## 2023-12-08 DIAGNOSIS — E639 Nutritional deficiency, unspecified: Secondary | ICD-10-CM | POA: Diagnosis present

## 2023-12-08 DIAGNOSIS — B961 Klebsiella pneumoniae [K. pneumoniae] as the cause of diseases classified elsewhere: Secondary | ICD-10-CM | POA: Diagnosis present

## 2023-12-08 DIAGNOSIS — Z886 Allergy status to analgesic agent status: Secondary | ICD-10-CM

## 2023-12-08 DIAGNOSIS — Z7901 Long term (current) use of anticoagulants: Secondary | ICD-10-CM

## 2023-12-08 DIAGNOSIS — Z7984 Long term (current) use of oral hypoglycemic drugs: Secondary | ICD-10-CM

## 2023-12-08 DIAGNOSIS — K5900 Constipation, unspecified: Secondary | ICD-10-CM | POA: Diagnosis present

## 2023-12-08 DIAGNOSIS — I4891 Unspecified atrial fibrillation: Secondary | ICD-10-CM | POA: Diagnosis present

## 2023-12-08 DIAGNOSIS — Z888 Allergy status to other drugs, medicaments and biological substances status: Secondary | ICD-10-CM

## 2023-12-08 DIAGNOSIS — M1990 Unspecified osteoarthritis, unspecified site: Secondary | ICD-10-CM | POA: Diagnosis present

## 2023-12-08 DIAGNOSIS — L89321 Pressure ulcer of left buttock, stage 1: Secondary | ICD-10-CM | POA: Diagnosis present

## 2023-12-08 DIAGNOSIS — Z961 Presence of intraocular lens: Secondary | ICD-10-CM | POA: Diagnosis present

## 2023-12-08 DIAGNOSIS — N390 Urinary tract infection, site not specified: Secondary | ICD-10-CM | POA: Diagnosis present

## 2023-12-08 DIAGNOSIS — R4189 Other symptoms and signs involving cognitive functions and awareness: Secondary | ICD-10-CM | POA: Diagnosis not present

## 2023-12-08 DIAGNOSIS — E782 Mixed hyperlipidemia: Secondary | ICD-10-CM | POA: Diagnosis present

## 2023-12-08 DIAGNOSIS — Z87891 Personal history of nicotine dependence: Secondary | ICD-10-CM

## 2023-12-08 DIAGNOSIS — E119 Type 2 diabetes mellitus without complications: Secondary | ICD-10-CM

## 2023-12-08 DIAGNOSIS — D696 Thrombocytopenia, unspecified: Secondary | ICD-10-CM | POA: Diagnosis present

## 2023-12-08 DIAGNOSIS — Z951 Presence of aortocoronary bypass graft: Secondary | ICD-10-CM

## 2023-12-08 DIAGNOSIS — N3000 Acute cystitis without hematuria: Secondary | ICD-10-CM | POA: Diagnosis not present

## 2023-12-08 DIAGNOSIS — Z79890 Hormone replacement therapy (postmenopausal): Secondary | ICD-10-CM

## 2023-12-08 DIAGNOSIS — Z9841 Cataract extraction status, right eye: Secondary | ICD-10-CM

## 2023-12-08 DIAGNOSIS — Z1152 Encounter for screening for COVID-19: Secondary | ICD-10-CM

## 2023-12-08 DIAGNOSIS — E872 Acidosis, unspecified: Secondary | ICD-10-CM | POA: Diagnosis present

## 2023-12-08 DIAGNOSIS — E669 Obesity, unspecified: Secondary | ICD-10-CM | POA: Diagnosis present

## 2023-12-08 DIAGNOSIS — L89311 Pressure ulcer of right buttock, stage 1: Secondary | ICD-10-CM | POA: Diagnosis present

## 2023-12-08 DIAGNOSIS — Z95810 Presence of automatic (implantable) cardiac defibrillator: Secondary | ICD-10-CM

## 2023-12-08 DIAGNOSIS — I9589 Other hypotension: Secondary | ICD-10-CM | POA: Diagnosis present

## 2023-12-08 DIAGNOSIS — E876 Hypokalemia: Secondary | ICD-10-CM | POA: Diagnosis present

## 2023-12-08 DIAGNOSIS — I10 Essential (primary) hypertension: Secondary | ICD-10-CM | POA: Diagnosis present

## 2023-12-08 DIAGNOSIS — E663 Overweight: Secondary | ICD-10-CM | POA: Diagnosis present

## 2023-12-08 DIAGNOSIS — E162 Hypoglycemia, unspecified: Secondary | ICD-10-CM | POA: Diagnosis not present

## 2023-12-08 DIAGNOSIS — E785 Hyperlipidemia, unspecified: Secondary | ICD-10-CM | POA: Diagnosis present

## 2023-12-08 LAB — CBC WITH DIFFERENTIAL/PLATELET
Abs Immature Granulocytes: 0.13 10*3/uL — ABNORMAL HIGH (ref 0.00–0.07)
Basophils Absolute: 0 10*3/uL (ref 0.0–0.1)
Basophils Relative: 0 %
Eosinophils Absolute: 0 10*3/uL (ref 0.0–0.5)
Eosinophils Relative: 0 %
HCT: 31.9 % — ABNORMAL LOW (ref 39.0–52.0)
Hemoglobin: 10.5 g/dL — ABNORMAL LOW (ref 13.0–17.0)
Immature Granulocytes: 1 %
Lymphocytes Relative: 3 %
Lymphs Abs: 0.4 10*3/uL — ABNORMAL LOW (ref 0.7–4.0)
MCH: 30.1 pg (ref 26.0–34.0)
MCHC: 32.9 g/dL (ref 30.0–36.0)
MCV: 91.4 fL (ref 80.0–100.0)
Monocytes Absolute: 0.9 10*3/uL (ref 0.1–1.0)
Monocytes Relative: 8 %
Neutro Abs: 10.3 10*3/uL — ABNORMAL HIGH (ref 1.7–7.7)
Neutrophils Relative %: 88 %
Platelets: 193 10*3/uL (ref 150–400)
RBC: 3.49 MIL/uL — ABNORMAL LOW (ref 4.22–5.81)
RDW: 15.2 % (ref 11.5–15.5)
WBC: 11.7 10*3/uL — ABNORMAL HIGH (ref 4.0–10.5)
nRBC: 0 % (ref 0.0–0.2)

## 2023-12-08 LAB — BASIC METABOLIC PANEL
Anion gap: 12 (ref 5–15)
BUN: 19 mg/dL (ref 8–23)
CO2: 25 mmol/L (ref 22–32)
Calcium: 7.9 mg/dL — ABNORMAL LOW (ref 8.9–10.3)
Chloride: 99 mmol/L (ref 98–111)
Creatinine, Ser: 1.07 mg/dL (ref 0.61–1.24)
GFR, Estimated: >60 mL/min (ref >60)
Glucose, Bld: 146 mg/dL — ABNORMAL HIGH (ref 70–99)
Potassium: 3.3 mmol/L — ABNORMAL LOW (ref 3.5–5.1)
Sodium: 136 mmol/L (ref 135–145)

## 2023-12-08 LAB — MAGNESIUM: Magnesium: 1.2 mg/dL — ABNORMAL LOW (ref 1.7–2.4)

## 2023-12-08 LAB — URINALYSIS, W/ REFLEX TO CULTURE (INFECTION SUSPECTED)
Bilirubin Urine: NEGATIVE
Glucose, UA: >=500 mg/dL — AB
Ketones, ur: NEGATIVE mg/dL
Nitrite: NEGATIVE
Protein, ur: 100 mg/dL — AB
RBC / HPF: >50 RBC/hpf (ref 0–5)
Specific Gravity, Urine: 1.005 (ref 1.005–1.030)
Squamous Epithelial / HPF: 0 /[HPF] (ref 0–5)
WBC, UA: >50 WBC/hpf (ref 0–5)
pH: 6 (ref 5.0–8.0)

## 2023-12-08 LAB — RESP PANEL BY RT-PCR (RSV, FLU A&B, COVID)  RVPGX2
Influenza A by PCR: NEGATIVE
Influenza B by PCR: NEGATIVE
Resp Syncytial Virus by PCR: NEGATIVE
SARS Coronavirus 2 by RT PCR: NEGATIVE

## 2023-12-08 LAB — PROCALCITONIN: Procalcitonin: 0.14 ng/mL

## 2023-12-08 LAB — CBG MONITORING, ED
Glucose-Capillary: 47 mg/dL — ABNORMAL LOW (ref 70–99)
Glucose-Capillary: 48 mg/dL — ABNORMAL LOW (ref 70–99)
Glucose-Capillary: 61 mg/dL — ABNORMAL LOW (ref 70–99)
Glucose-Capillary: 74 mg/dL (ref 70–99)
Glucose-Capillary: 93 mg/dL (ref 70–99)
Glucose-Capillary: 98 mg/dL (ref 70–99)

## 2023-12-08 LAB — LACTIC ACID, PLASMA: Lactic Acid, Venous: 2.3 mmol/L (ref 0.5–1.9)

## 2023-12-08 LAB — PHOSPHORUS: Phosphorus: 3.2 mg/dL (ref 2.5–4.6)

## 2023-12-08 LAB — BRAIN NATRIURETIC PEPTIDE: B Natriuretic Peptide: 343.6 pg/mL — ABNORMAL HIGH (ref 0.0–100.0)

## 2023-12-08 MED ORDER — MORPHINE SULFATE (PF) 4 MG/ML IV SOLN
4.0000 mg | Freq: Once | INTRAVENOUS | Status: AC
  Administered 2023-12-08: 4 mg via INTRAVENOUS
  Filled 2023-12-08: qty 1

## 2023-12-08 MED ORDER — SODIUM CHLORIDE 0.9 % IV BOLUS
1000.0000 mL | Freq: Once | INTRAVENOUS | Status: AC
  Administered 2023-12-08: 1000 mL via INTRAVENOUS

## 2023-12-08 MED ORDER — DEXTROSE 50 % IV SOLN
1.0000 | Freq: Once | INTRAVENOUS | Status: AC
  Administered 2023-12-08: 50 mL via INTRAVENOUS

## 2023-12-08 MED ORDER — ACETAMINOPHEN 325 MG PO TABS
650.0000 mg | ORAL_TABLET | Freq: Four times a day (QID) | ORAL | Status: DC | PRN
  Administered 2023-12-09: 650 mg via ORAL
  Filled 2023-12-08: qty 2

## 2023-12-08 MED ORDER — SODIUM CHLORIDE 0.9 % IV SOLN
1.0000 g | INTRAVENOUS | Status: AC
  Administered 2023-12-08: 1 g via INTRAVENOUS
  Filled 2023-12-08: qty 10

## 2023-12-08 MED ORDER — HYDRALAZINE HCL 20 MG/ML IJ SOLN: 5.0000 mg | INTRAMUSCULAR | Status: DC | PRN

## 2023-12-08 MED ORDER — DEXTROSE 50 % IV SOLN
INTRAVENOUS | Status: AC
  Filled 2023-12-08: qty 50

## 2023-12-08 MED ORDER — DEXTROSE 10 % IV SOLN
INTRAVENOUS | Status: DC
  Filled 2023-12-08: qty 1000

## 2023-12-08 MED ORDER — DEXTROSE 50 % IV SOLN
50.0000 mL | INTRAVENOUS | Status: DC | PRN
  Administered 2023-12-08 – 2023-12-09 (×3): 50 mL via INTRAVENOUS
  Filled 2023-12-08 (×3): qty 50

## 2023-12-08 MED ORDER — ONDANSETRON HCL 4 MG/2ML IJ SOLN
4.0000 mg | Freq: Three times a day (TID) | INTRAMUSCULAR | Status: DC | PRN
Start: 2023-12-08 — End: 2023-12-12

## 2023-12-08 MED ORDER — DEXTROSE 5 % IV SOLN: Freq: Once | INTRAVENOUS | Status: AC

## 2023-12-08 MED ORDER — POTASSIUM CHLORIDE CRYS ER 20 MEQ PO TBCR
40.0000 meq | EXTENDED_RELEASE_TABLET | ORAL | Status: AC
  Administered 2023-12-08 – 2023-12-09 (×2): 40 meq via ORAL
  Filled 2023-12-08 (×2): qty 2

## 2023-12-08 NOTE — ED Triage Notes (Signed)
Pt to ED via [facility] from home. Pt found unresponsive by his wife. Pt last seen normal last night. When Fire department got there his CBG was 33. Pt given 1 bag of D10 in 250 mL.Pt is Alert on arrival. Pt c/o being cold. Pt is in NAD.

## 2023-12-08 NOTE — ED Provider Notes (Signed)
[facility] Provider Note    Event Date/Time   First MD Initiated Contact with Patient 12/08/23 1539     (approximate)   History   Chief Complaint: Hypoglycemia   HPI  Jason [name] is a 73 y.o. male with a history of hypertension diabetes who was brought to the ED due to unresponsiveness at home.  First responders found the patient's blood sugar to be 33.  He was given 250 mL of D10 with normalization of his mental status.  Patient reports taking all his medications as usual, last took his metformin and glipizide yesterday morning.  Last oral intake was 6 PM yesterday, leftover spaghetti, has not eaten this morning.  Denies any trouble with medication management or taking any extra doses of medication.  Denies any recent illness, felt normal at bedtime last night.  Currently denies pain or shortness of breath.          Physical Exam   Triage Vital Signs: ED Triage Vitals  Encounter Vitals Group     BP 12/08/23 1537 (!) 153/80     Systolic BP Percentile --      Diastolic BP Percentile --      Pulse --      Resp 12/08/23 1537 20     Temp 12/08/23 1537 98.4 F (36.9 C)     Temp Source 12/08/23 1537 Oral     SpO2 12/08/23 1537 95 %     Weight 12/08/23 1538 235 lb (106.6 kg)     Height 12/08/23 1538 6\' 4"  (1.93 m)     Head Circumference --      Peak Flow --      Pain Score 12/08/23 1538 0     Pain Loc --      Pain Education --      Exclude from Growth Chart --     Most recent vital signs: Vitals:   12/08/23 1537 12/08/23 1930  BP: (!) 153/80 124/62  Pulse:  (!) 110  Resp: 20 (!) 24  Temp: 98.4 F (36.9 C)   SpO2: 95% 93%    General: Awake, no distress.  CV:  Good peripheral perfusion.  Regular rate rhythm Resp:  Normal effort.  Clear to auscultation bilaterally Abd:  No distention.  Soft nontender Other:  No lower extremity edema   ED Results / Procedures / Treatments   Labs (all labs ordered are listed, but only abnormal  results are displayed) Labs Reviewed  BASIC METABOLIC PANEL - Abnormal; Notable for the following components:      Result Value   Potassium 3.3 (*)    Glucose, Bld 146 (*)    Calcium 7.9 (*)    All other components within normal limits  CBC WITH DIFFERENTIAL/PLATELET - Abnormal; Notable for the following components:   WBC 11.7 (*)    RBC 3.49 (*)    Hemoglobin 10.5 (*)    HCT 31.9 (*)    Neutro Abs 10.3 (*)    Lymphs Abs 0.4 (*)    Abs Immature Granulocytes 0.13 (*)    All other components within normal limits  URINALYSIS, W/ REFLEX TO CULTURE (INFECTION SUSPECTED) - Abnormal; Notable for the following components:   Color, Urine YELLOW (*)    APPearance CLOUDY (*)    Glucose, UA >=500 (*)    Hgb urine dipstick LARGE (*)    Protein, ur 100 (*)    Leukocytes,Ua LARGE (*)    Bacteria, UA FEW (*)  All other components within normal limits  MAGNESIUM - Abnormal; Notable for the following components:   Magnesium 1.2 (*)    All other components within normal limits  BRAIN NATRIURETIC PEPTIDE - Abnormal; Notable for the following components:   B Natriuretic Peptide 343.6 (*)    All other components within normal limits  CBG MONITORING, ED - Abnormal; Notable for the following components:   Glucose-Capillary 47 (*)    All other components within normal limits  CBG MONITORING, ED - Abnormal; Notable for the following components:   Glucose-Capillary 61 (*)    All other components within normal limits  RESP PANEL BY RT-PCR (RSV, FLU A&B, COVID)  RVPGX2  URINE CULTURE  PHOSPHORUS  PROCALCITONIN  LACTIC ACID, PLASMA  LACTIC ACID, PLASMA  BASIC METABOLIC PANEL  CBC  CBG MONITORING, ED  CBG MONITORING, ED     EKG Interpreted by me Atrial fibrillation, rate of 137.  Normal axis, normal intervals.  Poor R wave progression.  Left bundle branch block.  No acute ischemic changes.   RADIOLOGY    PROCEDURES:  .Critical Care  Performed by: Sharman Cheek, MD Authorized  by: Sharman Cheek, MD   Critical care provider statement:    Critical care time (minutes):  35   Critical care time was exclusive of:  Separately billable procedures and treating other patients   Critical care was necessary to treat or prevent imminent or life-threatening deterioration of the following conditions:  Metabolic crisis and endocrine crisis   Critical care was time spent personally by me on the following activities:  Development of treatment plan with patient or surrogate, discussions with consultants, evaluation of patient's response to treatment, examination of patient, obtaining history from patient or surrogate, ordering and performing treatments and interventions, ordering and review of laboratory studies, ordering and review of radiographic studies, pulse oximetry, re-evaluation of patient's condition and review of old charts   Care discussed with: admitting provider      MEDICATIONS ORDERED IN ED: Medications  dextrose 10 % infusion ( Intravenous New Bag/Given 12/08/23 2206)  potassium chloride SA (KLOR-CON M) CR tablet 40 mEq (40 mEq Oral Given 12/08/23 2149)  dextrose 50 % solution 50 mL (has no administration in time range)  ondansetron (ZOFRAN) injection 4 mg (has no administration in time range)  hydrALAZINE (APRESOLINE) injection 5 mg (has no administration in time range)  acetaminophen (TYLENOL) tablet 650 mg (has no administration in time range)  dextrose 50 % solution 50 mL (50 mLs Intravenous Given 12/08/23 1550)  cefTRIAXone (ROCEPHIN) 1 g in sodium chloride 0.9 % 100 mL IVPB (0 g Intravenous Stopped 12/08/23 1946)  dextrose 5 % solution (0 mLs Intravenous Stopped 12/08/23 2147)  sodium chloride 0.9 % bolus 1,000 mL (0 mLs Intravenous Stopped 12/08/23 2147)  morphine (PF) 4 MG/ML injection 4 mg (4 mg Intravenous Given 12/08/23 2149)     IMPRESSION / MDM / ASSESSMENT AND PLAN / ED COURSE  I reviewed the triage vital signs and the nursing notes.  DDx: AKI,  electrolyte derangement, UTI, COVID, influenza, persistent hypoglycemia  Patient's presentation is most consistent with acute presentation with potential threat to life or bodily function.  Patient presents with an episode of hypoglycemia, corrected prehospital with D10 bolus that increases blood sugar from 40-110.  Will check labs, give food, monitor blood sugar.   Clinical Course as of 12/08/23 2211  Wed Dec 08, 2023  1615 Became hypoglycemia to 73 again after initial d10 bolus. Will give d50  [PS]  1910 Labs reveal UTI. Rocephin ordered. Will recheck cbg - if low again, will need to admit on dextrose infusion [PS]    Clinical Course User Index [PS] Sharman Cheek, MD     ----------------------------------------- 10:12 PM on 12/08/2023 ----------------------------------------- Patient again had recurrent hypoglycemia despite eating well.  Suspect glucose dysregulation due to UTI.  Case discussed with hospitalist for further management.  Dextrose infusion started.  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   Sharman Cheek, MD 12/08/23 2212

## 2023-12-08 NOTE — ED Notes (Signed)
Pt given a sandwich tray and orange juice. Pt demonstrated that they are able to feed themselves. Pt is tolerating activity well.

## 2023-12-08 NOTE — ED Notes (Signed)
Pts linens and brief changed. Pt given sandwich tray and ginger ale. Pt sitting in bed eating at this time.

## 2023-12-08 NOTE — H&P (Incomplete)
History and Physical    Jason [name] ZOX:[id number] DOB: 02-02-1951 DOA: 12/08/2023  Referring MD/NP/PA:   PCP: Ailene Ravel, MD   Patient coming from:  The patient is coming from home.     Chief Complaint: Unresponsiveness, hypoglycemia, burning on urination  HPI: Jason [name] is a 73 y.o. male with medical history significant of hypertension, hyperlipidemia, diabetes mellitus, CAD, [name], AICD, diastolic CHF, hypothyroidism, A-fib on Eliquis, spinal stenosis, who presents with unresponsiveness and hypoglycemia.  Per report, pt was found to be unresponsive at home.  He was normal last night.  Per EMS report, patient had CBG of 33. Pt was given 1 bag of D10 in 250 mL.  His mental status is back to baseline at arrival to ED.  Patient feels cold, denies fall or any injury.  No chest pain, cough, SOB.  No nausea, vomiting, diarrhea or abdominal pain.  He reports burning with urination, denies dysuria.  No hematuria.  Data reviewed independently and ED Course: pt was found to have WBC 11.7, BNP 343.6,  positive urinalysis (cloudy appearance, large amount of leukocyte, few bacteria, WBC> 50), negative PCR for COVID, flu and RSV, potassium 3.3, magnesium 1.2, phosphorus 3.2, GFR> 60, temperature normal, blood pressure 124/62, heart rate of 110, RR 24, oxygen saturation 93%-95% on room air.  Patient is placed to PCU for observation.   EKG: I have personally reviewed.  A-fib, poor R wave progression, with artificial effects   Review of Systems:   General: no fevers, feeling cold, no body weight gain, has poor appetite, has fatigue HEENT: no blurry vision, hearing changes or sore throat Respiratory: no dyspnea, coughing, wheezing CV: no chest pain, no palpitations GI: no nausea, vomiting, abdominal pain, diarrhea, constipation GU: no dysuria, has burning on urination, no increased urinary frequency, hematuria  Ext: no leg edema Neuro: no unilateral weakness, numbness, or tingling, no  vision change or hearing loss.  Has responsiveness Skin: no rash, no skin tear. MSK: No muscle spasm, no deformity, no limitation of range of movement in spin Heme: No easy bruising.  Travel history: No recent long distant travel.   Allergy:  Allergies  Allergen Reactions   Aspirin Anaphylaxis   Inderal [Propranolol]     Other reaction(s): Other (See Comments) Fatigue   Metformin Hcl Diarrhea    Pt still takes medication   Mevacor [Lovastatin] Other (See Comments)    muscle Pain   Penicillin G Hives   Pravachol [Pravastatin Sodium] Other (See Comments)    Muscle Pain   Zocor [Simvastatin] Other (See Comments)    Muscle Pain   Penicillins Rash    Has patient had a PCN reaction causing immediate rash, facial/tongue/throat swelling, SOB or lightheadedness with hypotension: Yes Has patient had a PCN reaction causing severe rash involving mucus membranes or skin necrosis: Yes Has patient had a PCN reaction that required hospitalization No Has patient had a PCN reaction occurring within the last 10 years: Yes If all of the above answers are "NO", then may proceed with Cephalosporin use.     Past Medical History:  Diagnosis Date   AICD (automatic cardioverter/defibrillator) present    [employer]   Arthritis    CHF (congestive heart failure) (HCC)    Coronary artery disease involving native coronary artery with unstable angina pectoris (HCC) 04/13/2016   Coronary artery disease with history of myocardial infarction without history of [name]    Dr. 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 [facility] [place], [place]. Dr. Elesa Hacker x2   CARDIAC CATHETERIZATION Left 04/13/2016   Procedure: Left Heart Cath and Coronary Angiography;  Surgeon: [name], MD;  Location: [facility];  Service: Cardiovascular;  Laterality: Left;   CARDIOVERSION N/A 08/02/2018   Procedure: CARDIOVERSION;  Surgeon: [name], MD;  Location: [facility] ORS;  Service: Cardiovascular;  Laterality: N/A;   CATARACT EXTRACTION W/PHACO Right 01/23/2016   Procedure: CATARACT EXTRACTION PHACO AND INTRAOCULAR LENS PLACEMENT (IOC);  Surgeon: Galen Manila, MD;  Location: [facility] ORS;  Service: Ophthalmology;  Laterality: Right;  [place] 01:18 AP% 23.8 CDE 18.67 fluid pack lot # [id number]   CHOLECYSTECTOMY     CORONARY ARTERY BYPASS GRAFT N/A 04/14/2016   Procedure: CORONARY ARTERY BYPASS GRAFTING TIMES FOUR    USING LEFT INTERNAL MAMMARY AND ENDOSCOPIC HARVEST RIGHT SAPHENOUS VEIN;  Surgeon: Purcell Nails, MD;  Location: [facility] OR;  Service: Open Heart Surgery;  Laterality: N/A;   EYE SURGERY Bilateral    Cataract Extraction with IOL implant.   INTRAOPERATIVE TRANSESOPHAGEAL ECHOCARDIOGRAM N/A 04/14/2016   Procedure: INTRAOPERATIVE TRANSESOPHAGEAL ECHOCARDIOGRAM;  Surgeon: Purcell Nails, MD;  Location: [facility] OR;  Service: Open Heart Surgery;  Laterality: N/A;   KNEE ARTHROSCOPY Left 10/08/2015   Procedure: ARTHROSCOPY KNEE, PARTIAL MEDIAL AND LATERAL MENISECTOMY, REMOVAL OF FOREIGN BODY;  Surgeon: Kennedy Bucker, MD;  Location: [facility] ORS;  Service: Orthopedics;  Laterality: Left;   LUMBAR LAMINECTOMY/DECOMPRESSION MICRODISCECTOMY Bilateral 10/18/2023   Procedure: Lumbar Three-Four, Lumbar Four-Five  Laminotomy, Foraminotomy;  Surgeon: Tressie Stalker, MD;  Location: [facility] OR;  Service: Neurosurgery;  Laterality: Bilateral;  3C    Social History:  reports that he has never smoked. His smokeless tobacco  use includes chew. He reports that he does not drink alcohol and does not use drugs.  Family History:  Family History  Problem Relation Age of Onset   Hypertension Father    Aortic stenosis Father    Heart disease Father      Prior to Admission medications   Medication Sig Start Date End Date Taking? Authorizing Provider  acetaminophen (TYLENOL) 500 MG tablet Take 500-1,000 mg by mouth every 6 (six) hours as needed (pain.).    [provider]  amiodarone (PACERONE) 200 MG tablet Take 100 mg by mouth in the morning.    [provider]  apixaban (ELIQUIS) 5 MG TABS tablet Take 1 tablet (5 mg total) by mouth 2 (two) times daily. 10/25/23   Patrici Ranks Caylin, PA-C  dapagliflozin propanediol (FARXIGA) 10 MG TABS tablet Take 10 mg by mouth in the morning. Patient not taking: Reported on 10/29/2023    [provider]  docusate sodium (COLACE) 100 MG capsule Take 1 capsule (100 mg total) by mouth 2 (two) times daily. 10/19/23 10/18/24  Clovis Riley, PA-C  Ensure Max Protein (ENSURE MAX PROTEIN) LIQD Take 330 mLs (  11 oz total) by mouth 2 (two) times daily. 11/04/23   Marolyn Haller, MD  gabapentin (NEURONTIN) 100 MG capsule Take 100 mg by mouth at bedtime as needed (nerve pain.). 06/30/23   [provider]  levothyroxine (SYNTHROID) 50 MCG tablet Take 50 mcg by mouth daily at 2 am. 08/08/23   [provider]  lidocaine (LIDODERM) 5 % Place 1 patch onto the skin daily. Remove & Discard patch within 12 hours or as directed by MD 11/04/23   Marolyn Haller, MD  melatonin 5 MG TABS Take 1 tablet (5 mg total) by mouth at bedtime. 11/04/23   Marolyn Haller, MD  metFORMIN (GLUCOPHAGE-XR) 500 MG 24 hr tablet Take 1 tablet (500 mg total) by mouth 2 (two) times daily with a meal. 11/04/23   Marolyn Haller, MD  metoprolol succinate (TOPROL-XL) 100 MG 24 hr tablet Take 100 mg by mouth in the morning. Take with or immediately following a meal.    [provider]  Multiple Vitamin (MULTIVITAMIN WITH MINERALS) TABS tablet Take 1 tablet by mouth daily with lunch. 11/05/23   Marolyn Haller, MD  Nystatin (GERHARDT'S BUTT CREAM) CREA Apply 1 Application topically 2 (two) times daily. 11/04/23   Marolyn Haller, MD  pantoprazole (PROTONIX) 40 MG tablet Take 1 tablet (40 mg total) by mouth daily. 11/05/23   Marolyn Haller, MD  rosuvastatin (CRESTOR) 40 MG tablet Take 20 mg by mouth at bedtime. 07/01/23   [provider]  sacubitril-valsartan (ENTRESTO) 49-51 MG Take 1 tablet by mouth 2 (two) times daily. Patient not taking: Reported on 10/29/2023    [provider]  senna-docusate (SENOKOT-S) 8.6-50 MG tablet Take 1 tablet by mouth at bedtime as needed for moderate constipation. 11/04/23   Marolyn Haller, MD  torsemide (DEMADEX) 20 MG tablet Take 20 mg by mouth in the morning. 11/23/22   [provider]    Physical Exam: Vitals:   12/08/23 1930 12/08/23 2238 12/09/23 0137 12/09/23 0145  BP: 124/62  (!) 95/57 (!) 116/56  Pulse: (!) 110  (!) 120 (!) 112  Resp: (!) 24  16 (!) 27  Temp:  98.4 F (36.9 C) (!) 102.9 F (39.4 C)   TempSrc:  Oral Oral   SpO2: 93%  100% 98%  Weight:      Height:       General: Not in acute distress HEENT:       Eyes: PERRL, EOMI, no jaundice       ENT: No discharge from the ears and nose, no pharynx injection, no tonsillar enlargement.        Neck: No JVD, no bruit, no mass felt. Heme: No neck lymph node enlargement. Cardiac: S1/S2, RRR, No murmurs, No gallops or rubs. Respiratory: No rales, wheezing, rhonchi or rubs. GI: Soft, nondistended, nontender, no rebound pain, no organomegaly, BS present. GU: No hematuria Ext: No pitting leg edema bilaterally. 1+DP/PT pulse bilaterally. Musculoskeletal: No joint deformities, No joint redness or warmth, no limitation of ROM in spin. Skin: No rashes.  Neuro: Alert, oriented X3, cranial nerves II-XII grossly intact, moves all extremities  normally. Psych: Patient is not psychotic, no suicidal or hemocidal ideation.  Labs on Admission: I have personally reviewed following labs and imaging studies  CBC: Recent Labs  Lab 12/08/23 1559  WBC 11.7*  NEUTROABS 10.3*  HGB 10.5*  HCT 31.9*  MCV 91.4  PLT 193   Basic Metabolic Panel: Recent Labs  Lab 12/08/23 1559  NA 136  K 3.3*  CL  99  CO2 25  GLUCOSE 146*  BUN 19  CREATININE 1.07  CALCIUM 7.9*  MG 1.2*  PHOS 3.2   GFR: Estimated Creatinine Clearance: 82.4 mL/min (by C-G formula based on SCr of 1.07 mg/dL). Liver Function Tests: No results for input(s): "AST", "ALT", "ALKPHOS", "BILITOT", "PROT", "ALBUMIN" in the last 168 hours. No results for input(s): "LIPASE", "AMYLASE" in the last 168 hours. No results for input(s): "AMMONIA" in the last 168 hours. Coagulation Profile: No results for input(s): "INR", "PROTIME" in the last 168 hours. Cardiac Enzymes: No results for input(s): "CKTOTAL", "CKMB", "CKMBINDEX", "TROPONINI" in the last 168 hours. BNP (last 3 results) No results for input(s): "PROBNP" in the last 8760 hours. HbA1C: No results for input(s): "HGBA1C" in the last 72 hours. CBG: Recent Labs  Lab 12/08/23 1937 12/08/23 2204 12/08/23 2321 12/08/23 2342 12/09/23 0035  GLUCAP 61* 74 48* 98 46*   Lipid Profile: No results for input(s): "CHOL", "HDL", "LDLCALC", "TRIG", "CHOLHDL", "LDLDIRECT" in the last 72 hours. Thyroid Function Tests: No results for input(s): "TSH", "T4TOTAL", "FREET4", "T3FREE", "THYROIDAB" in the last 72 hours. Anemia Panel: No results for input(s): "VITAMINB12", "FOLATE", "FERRITIN", "TIBC", "IRON", "RETICCTPCT" in the last 72 hours. Urine analysis:    Component Value Date/Time   COLORURINE YELLOW (A) 12/08/2023 1559   APPEARANCEUR CLOUDY (A) 12/08/2023 1559   LABSPEC 1.005 12/08/2023 1559   PHURINE 6.0 12/08/2023 1559   GLUCOSEU >=500 (A) 12/08/2023 1559   HGBUR LARGE (A) 12/08/2023 1559   BILIRUBINUR NEGATIVE  12/08/2023 1559   KETONESUR NEGATIVE 12/08/2023 1559   PROTEINUR 100 (A) 12/08/2023 1559   NITRITE NEGATIVE 12/08/2023 1559   LEUKOCYTESUR LARGE (A) 12/08/2023 1559   Sepsis Labs: @LABRCNTIP (procalcitonin:4,lacticidven:4) ) Recent Results (from the past 240 hours)  Resp panel by RT-PCR (RSV, Flu A&B, Covid) Anterior Nasal Swab     Status: None   Collection Time: 12/08/23  3:59 PM   Specimen: Anterior Nasal Swab  Result Value Ref Range Status   SARS Coronavirus 2 by RT PCR NEGATIVE NEGATIVE Final    Comment: (NOTE) SARS-CoV-2 target nucleic acids are NOT DETECTED.  The SARS-CoV-2 RNA is generally detectable in upper respiratory specimens during the acute phase of infection. The lowest concentration of SARS-CoV-2 viral copies this assay can detect is 138 copies/mL. A negative result does not preclude SARS-Cov-2 infection and should not be used as the sole basis for treatment or other patient management decisions. A negative result may occur with  improper specimen collection/handling, submission of specimen other than nasopharyngeal swab, presence of viral mutation(s) within the areas targeted by this assay, and inadequate number of viral copies(<138 copies/mL). A negative result must be combined with clinical observations, patient history, and epidemiological information. The expected result is Negative.  Fact Sheet for Patients:  BloggerCourse.com  Fact Sheet for Healthcare Providers:  SeriousBroker.it  This test is no t yet approved or cleared by the [place] FDA and  has been authorized for detection and/or diagnosis of SARS-CoV-2 by FDA under an Emergency Use Authorization (EUA). This EUA will remain  in effect (meaning this test can be used) for the duration of the COVID-19 declaration under Section 564(b)(1) of the Act, 21 U.S.C.section 360bbb-3(b)(1), unless the authorization is terminated  or revoked sooner.        Influenza A by PCR NEGATIVE NEGATIVE Final   Influenza B by PCR NEGATIVE NEGATIVE Final    Comment: (NOTE) The Xpert Xpress SARS-CoV-2/FLU/RSV plus assay is intended as an aid in the diagnosis of  influenza from Nasopharyngeal swab specimens and should not be used as a sole basis for treatment. Nasal washings and aspirates are unacceptable for Xpert Xpress SARS-CoV-2/FLU/RSV testing.  Fact Sheet for Patients: BloggerCourse.com  Fact Sheet for Healthcare Providers: SeriousBroker.it  This test is not yet approved or cleared by the [place] FDA and has been authorized for detection and/or diagnosis of SARS-CoV-2 by FDA under an Emergency Use Authorization (EUA). This EUA will remain in effect (meaning this test can be used) for the duration of the COVID-19 declaration under Section 564(b)(1) of the Act, 21 U.S.C. section 360bbb-3(b)(1), unless the authorization is terminated or revoked.     Resp Syncytial Virus by PCR NEGATIVE NEGATIVE Final    Comment: (NOTE) Fact Sheet for Patients: BloggerCourse.com  Fact Sheet for Healthcare Providers: SeriousBroker.it  This test is not yet approved or cleared by the [place] FDA and has been authorized for detection and/or diagnosis of SARS-CoV-2 by FDA under an Emergency Use Authorization (EUA). This EUA will remain in effect (meaning this test can be used) for the duration of the COVID-19 declaration under Section 564(b)(1) of the Act, 21 U.S.C. section 360bbb-3(b)(1), unless the authorization is terminated or revoked.  Performed at [facility], [address] [place], [place] [address]   Urine Culture     Status: None (Preliminary result)   Collection Time: 12/08/23  3:59 PM   Specimen: Urine, Clean Catch  Result Value Ref Range Status   Specimen Description   Final    URINE, CLEAN CATCH Performed at  [facility], [address], [place], [place] [address]    Special Requests   Final    NONE Reflexed from [id number] Performed at [facility], [address] [place], [place] [address]    Culture PENDING  Incomplete   Report Status PENDING  Incomplete     Radiological Exams on Admission:   Assessment/Plan Principal Problem:   Hypoglycemia Active Problems:   Severe sepsis (HCC)   UTI (urinary tract infection)   Unresponsiveness   CAD (coronary artery disease)   Chronic diastolic CHF (congestive heart failure) (HCC)   Essential hypertension   Paroxysmal atrial fibrillation (HCC)   Hypothyroidism   HLD (hyperlipidemia)   Hypomagnesemia   Hypokalemia   Overweight (BMI 25.0-29.9)   Diabetes mellitus without complication (HCC)   Assessment and Plan:  Hypoglycemia: Blood sugar down to 33, patient was given D50 and started on D10, still persistently hypoglycemic.  Likely due to poor oral intake and ongoing infection with UTI.  Mental status is back to baseline.  -Placed in PCU for observation --> change bed to SDU -Hold home diabetic medications -CBG every hour -D50 as needed -D10 at 100 cc/h  UTI (urinary tract infection) -IV Rocephin -Follow-up urine culture  Addendum --> severe sepsis due to UTI. Pt did not have fever earlier, but developed fever of 102.9, heart rate up to 120, RR 24, lactic acid 2.3, now patient has severe sepsis. Bp 65/57  -IVF: total of 3.5 L NS, and pt is on D10 as above -Solu-Cortef 100 mg -Midodrine 10 mg 3 times daily -Give albumin 25 -if Bp is not maintained, will need to start Levofed -will change bed to SUS  Unresponsiveness: Most likely due to hypoglycemia.  Since patient is on Eliquis, will get CT scan of head to make sure patient does not have head injury -Frequent neurocheck -Fall precaution -PT/OT -Follow-up CT of head  CAD (coronary artery disease): no CP -Crestor  Chronic diastolic CHF (congestive heart  failure) (HCC): 2D echo on 10/26/2023 showed EF of 50-55%.  Patient has mildly elevated BNP 343, but no leg edema, clinically does not seem to have CHF exacerbation. -Hold diuretics  Essential hypertension -I hydralazine as needed -Metoprolol  Paroxysmal atrial fibrillation (HCC): Heart rates are 110 -Amiodarone, metoprolol -Eliquis  Hypothyroidism -Synthroid  HLD (hyperlipidemia) -Crestor  Hypomagnesemia and Hypokalemia:  K 3.3 and Mg 1.2 -Repleted both potassium and magnesium  Overweight (BMI 25.0-29.9): Body weight 106.6 kg, BMI 28.61 -Encourage losing weight -Exercise and healthy diet  Diabetes mellitus without complication: Recent A1c 5.8, well-controlled.  Patient is taking [place] and metformin at home. -Hold home medications due to hypoglycemia      DVT ppx: on Eliquis  Code Status: Full code     Family Communication:     not done, no family member is at bed side.    Disposition Plan:  Anticipate discharge back to previous environment  Consults called:  none  Admission status and Level of care: Stepdown:  as inpt    Dispo: The patient is from: Home              Anticipated d/c is to: Home              Anticipated d/c date is: 2 days              Patient currently is not medically stable to d/c.    The appropriate patient status for this patient is INPATIENT. Inpatient status is judged to be reasonable and necessary in order to provide the required intensity of service to ensure the patient's safety. The patient's presenting symptoms, physical exam findings, and initial radiographic and laboratory data in the context of their chronic comorbidities is felt to place them at high risk for further clinical deterioration. Furthermore, it is not anticipated that the patient will be medically stable for discharge from the hospital within 2 midnights of admission.   * I certify that at the point of admission it is my clinical judgment that the patient will require  inpatient hospital care spanning beyond 2 midnights from the point of admission due to high intensity of service, high risk for further deterioration and high frequency of surveillance required.*     Date of Service 12/09/2023    Lorretta Harp [facility]   If 7PM-7AM, please contact night-coverage [email or web address] 12/09/2023, 2:01 AM Vasopressor Levophed

## 2023-12-09 ENCOUNTER — Inpatient Hospital Stay: Payer: Medicare Other

## 2023-12-09 ENCOUNTER — Observation Stay: Payer: Medicare Other

## 2023-12-09 DIAGNOSIS — Z7901 Long term (current) use of anticoagulants: Secondary | ICD-10-CM | POA: Diagnosis not present

## 2023-12-09 DIAGNOSIS — E11649 Type 2 diabetes mellitus with hypoglycemia without coma: Secondary | ICD-10-CM | POA: Diagnosis present

## 2023-12-09 DIAGNOSIS — D696 Thrombocytopenia, unspecified: Secondary | ICD-10-CM | POA: Diagnosis present

## 2023-12-09 DIAGNOSIS — A419 Sepsis, unspecified organism: Principal | ICD-10-CM

## 2023-12-09 DIAGNOSIS — R6521 Severe sepsis with septic shock: Secondary | ICD-10-CM | POA: Diagnosis present

## 2023-12-09 DIAGNOSIS — E162 Hypoglycemia, unspecified: Secondary | ICD-10-CM | POA: Diagnosis present

## 2023-12-09 DIAGNOSIS — I110 Hypertensive heart disease with heart failure: Secondary | ICD-10-CM | POA: Diagnosis present

## 2023-12-09 DIAGNOSIS — L89321 Pressure ulcer of left buttock, stage 1: Secondary | ICD-10-CM | POA: Diagnosis present

## 2023-12-09 DIAGNOSIS — E669 Obesity, unspecified: Secondary | ICD-10-CM | POA: Diagnosis present

## 2023-12-09 DIAGNOSIS — E1165 Type 2 diabetes mellitus with hyperglycemia: Secondary | ICD-10-CM | POA: Diagnosis not present

## 2023-12-09 DIAGNOSIS — Z79899 Other long term (current) drug therapy: Secondary | ICD-10-CM | POA: Diagnosis not present

## 2023-12-09 DIAGNOSIS — I2510 Atherosclerotic heart disease of native coronary artery without angina pectoris: Secondary | ICD-10-CM | POA: Diagnosis present

## 2023-12-09 DIAGNOSIS — E872 Acidosis, unspecified: Secondary | ICD-10-CM | POA: Diagnosis present

## 2023-12-09 DIAGNOSIS — Z1152 Encounter for screening for COVID-19: Secondary | ICD-10-CM | POA: Diagnosis not present

## 2023-12-09 DIAGNOSIS — N390 Urinary tract infection, site not specified: Secondary | ICD-10-CM | POA: Diagnosis present

## 2023-12-09 DIAGNOSIS — E876 Hypokalemia: Secondary | ICD-10-CM | POA: Diagnosis present

## 2023-12-09 DIAGNOSIS — I480 Paroxysmal atrial fibrillation: Secondary | ICD-10-CM | POA: Diagnosis present

## 2023-12-09 DIAGNOSIS — E039 Hypothyroidism, unspecified: Secondary | ICD-10-CM | POA: Diagnosis present

## 2023-12-09 DIAGNOSIS — I5032 Chronic diastolic (congestive) heart failure: Secondary | ICD-10-CM | POA: Diagnosis present

## 2023-12-09 DIAGNOSIS — E782 Mixed hyperlipidemia: Secondary | ICD-10-CM | POA: Diagnosis present

## 2023-12-09 DIAGNOSIS — E119 Type 2 diabetes mellitus without complications: Secondary | ICD-10-CM

## 2023-12-09 DIAGNOSIS — Z79890 Hormone replacement therapy (postmenopausal): Secondary | ICD-10-CM | POA: Diagnosis not present

## 2023-12-09 DIAGNOSIS — I4891 Unspecified atrial fibrillation: Secondary | ICD-10-CM

## 2023-12-09 DIAGNOSIS — Z7984 Long term (current) use of oral hypoglycemic drugs: Secondary | ICD-10-CM | POA: Diagnosis not present

## 2023-12-09 DIAGNOSIS — L89311 Pressure ulcer of right buttock, stage 1: Secondary | ICD-10-CM | POA: Diagnosis present

## 2023-12-09 LAB — CBC
HCT: 25.3 % — ABNORMAL LOW (ref 39.0–52.0)
Hemoglobin: 8 g/dL — ABNORMAL LOW (ref 13.0–17.0)
MCH: 29.2 pg (ref 26.0–34.0)
MCHC: 31.6 g/dL (ref 30.0–36.0)
MCV: 92.3 fL (ref 80.0–100.0)
Platelets: 108 10*3/uL — ABNORMAL LOW (ref 150–400)
RBC: 2.74 MIL/uL — ABNORMAL LOW (ref 4.22–5.81)
RDW: 14.9 % (ref 11.5–15.5)
WBC: 8.8 10*3/uL (ref 4.0–10.5)
nRBC: 0 % (ref 0.0–0.2)

## 2023-12-09 LAB — BASIC METABOLIC PANEL
Anion gap: 8 (ref 5–15)
Anion gap: 6 (ref 5–15)
Anion gap: 13 (ref 5–15)
Anion gap: 12 (ref 5–15)
BUN: 16 mg/dL (ref 8–23)
BUN: 14 mg/dL (ref 8–23)
BUN: 16 mg/dL (ref 8–23)
BUN: 17 mg/dL (ref 8–23)
CO2: 22 mmol/L (ref 22–32)
CO2: 23 mmol/L (ref 22–32)
CO2: 21 mmol/L — ABNORMAL LOW (ref 22–32)
CO2: 20 mmol/L — ABNORMAL LOW (ref 22–32)
Calcium: 7 mg/dL — ABNORMAL LOW (ref 8.9–10.3)
Calcium: 7.6 mg/dL — ABNORMAL LOW (ref 8.9–10.3)
Calcium: 7.6 mg/dL — ABNORMAL LOW (ref 8.9–10.3)
Calcium: 7.9 mg/dL — ABNORMAL LOW (ref 8.9–10.3)
Chloride: 104 mmol/L (ref 98–111)
Chloride: 107 mmol/L (ref 98–111)
Chloride: 103 mmol/L (ref 98–111)
Chloride: 105 mmol/L (ref 98–111)
Creatinine, Ser: 0.95 mg/dL (ref 0.61–1.24)
Creatinine, Ser: 0.99 mg/dL (ref 0.61–1.24)
Creatinine, Ser: 1.06 mg/dL (ref 0.61–1.24)
Creatinine, Ser: 1.01 mg/dL (ref 0.61–1.24)
GFR, Estimated: >60 mL/min (ref >60)
GFR, Estimated: >60 mL/min (ref >60)
GFR, Estimated: >60 mL/min (ref >60)
GFR, Estimated: >60 mL/min (ref >60)
Glucose, Bld: 69 mg/dL — ABNORMAL LOW (ref 70–99)
Glucose, Bld: 181 mg/dL — ABNORMAL HIGH (ref 70–99)
Glucose, Bld: 326 mg/dL — ABNORMAL HIGH (ref 70–99)
Glucose, Bld: 345 mg/dL — ABNORMAL HIGH (ref 70–99)
Potassium: 3.5 mmol/L (ref 3.5–5.1)
Potassium: 3.8 mmol/L (ref 3.5–5.1)
Potassium: 4 mmol/L (ref 3.5–5.1)
Potassium: 4.1 mmol/L (ref 3.5–5.1)
Sodium: 134 mmol/L — ABNORMAL LOW (ref 135–145)
Sodium: 136 mmol/L (ref 135–145)
Sodium: 137 mmol/L (ref 135–145)
Sodium: 137 mmol/L (ref 135–145)

## 2023-12-09 LAB — MRSA NEXT GEN BY PCR, NASAL: MRSA by PCR Next Gen: NOT DETECTED

## 2023-12-09 LAB — GLUCOSE, CAPILLARY
Glucose-Capillary: 142 mg/dL — ABNORMAL HIGH (ref 70–99)
Glucose-Capillary: 168 mg/dL — ABNORMAL HIGH (ref 70–99)
Glucose-Capillary: 170 mg/dL — ABNORMAL HIGH (ref 70–99)
Glucose-Capillary: 239 mg/dL — ABNORMAL HIGH (ref 70–99)
Glucose-Capillary: 303 mg/dL — ABNORMAL HIGH (ref 70–99)
Glucose-Capillary: 306 mg/dL — ABNORMAL HIGH (ref 70–99)
Glucose-Capillary: 311 mg/dL — ABNORMAL HIGH (ref 70–99)
Glucose-Capillary: 329 mg/dL — ABNORMAL HIGH (ref 70–99)
Glucose-Capillary: 335 mg/dL — ABNORMAL HIGH (ref 70–99)
Glucose-Capillary: 342 mg/dL — ABNORMAL HIGH (ref 70–99)
Glucose-Capillary: 532 mg/dL (ref 70–99)
Glucose-Capillary: 95 mg/dL (ref 70–99)
Glucose-Capillary: 98 mg/dL (ref 70–99)

## 2023-12-09 LAB — HEPATIC FUNCTION PANEL
ALT: 11 U/L (ref 0–44)
AST: 17 U/L (ref 15–41)
Albumin: 2.6 g/dL — ABNORMAL LOW (ref 3.5–5.0)
Alkaline Phosphatase: 37 U/L — ABNORMAL LOW (ref 38–126)
Bilirubin, Direct: 0.2 mg/dL (ref 0.0–0.2)
Indirect Bilirubin: 0.7 mg/dL (ref 0.3–0.9)
Total Bilirubin: 0.9 mg/dL (ref 0.0–1.2)
Total Protein: 5 g/dL — ABNORMAL LOW (ref 6.5–8.1)

## 2023-12-09 LAB — CBG MONITORING, ED
Glucose-Capillary: 34 mg/dL — CL (ref 70–99)
Glucose-Capillary: 46 mg/dL — ABNORMAL LOW (ref 70–99)
Glucose-Capillary: 88 mg/dL (ref 70–99)
Glucose-Capillary: 96 mg/dL (ref 70–99)

## 2023-12-09 LAB — PHOSPHORUS: Phosphorus: 2.1 mg/dL — ABNORMAL LOW (ref 2.5–4.6)

## 2023-12-09 LAB — MAGNESIUM
Magnesium: 1.3 mg/dL — ABNORMAL LOW (ref 1.7–2.4)
Magnesium: 1.8 mg/dL (ref 1.7–2.4)

## 2023-12-09 LAB — CORTISOL: Cortisol, Plasma: 92.6 ug/dL

## 2023-12-09 LAB — CK: Total CK: 102 U/L (ref 49–397)

## 2023-12-09 LAB — LACTIC ACID, PLASMA
Lactic Acid, Venous: 0.9 mmol/L (ref 0.5–1.9)
Lactic Acid, Venous: 0.9 mmol/L (ref 0.5–1.9)

## 2023-12-09 MED ORDER — MAGNESIUM SULFATE 2 GM/50ML IV SOLN
2.0000 g | Freq: Once | INTRAVENOUS | Status: AC
  Administered 2023-12-09: 2 g via INTRAVENOUS
  Filled 2023-12-09: qty 50

## 2023-12-09 MED ORDER — ORAL CARE MOUTH RINSE: 15.0000 mL | OROMUCOSAL | Status: DC | PRN

## 2023-12-09 MED ORDER — GABAPENTIN 100 MG PO CAPS: 100.0000 mg | ORAL_CAPSULE | Freq: Every evening | ORAL | Status: DC | PRN

## 2023-12-09 MED ORDER — SODIUM CHLORIDE 0.9 % IV SOLN: 1.0000 g | INTRAVENOUS | Status: DC

## 2023-12-09 MED ORDER — DOCUSATE SODIUM 100 MG PO CAPS
100.0000 mg | ORAL_CAPSULE | Freq: Two times a day (BID) | ORAL | Status: DC | PRN
  Administered 2023-12-11: 100 mg via ORAL
  Filled 2023-12-09: qty 1

## 2023-12-09 MED ORDER — APIXABAN 5 MG PO TABS
5.0000 mg | ORAL_TABLET | Freq: Two times a day (BID) | ORAL | Status: DC
  Administered 2023-12-09 – 2023-12-12 (×7): 5 mg via ORAL
  Filled 2023-12-09 (×7): qty 1

## 2023-12-09 MED ORDER — SODIUM CHLORIDE 0.9 % IV BOLUS
1000.0000 mL | Freq: Once | INTRAVENOUS | Status: AC
Start: 2023-12-09 — End: 2023-12-09
  Administered 2023-12-09: 1000 mL via INTRAVENOUS

## 2023-12-09 MED ORDER — AMIODARONE HCL 200 MG PO TABS
100.0000 mg | ORAL_TABLET | Freq: Every day | ORAL | Status: DC
  Administered 2023-12-09 – 2023-12-12 (×4): 100 mg via ORAL
  Filled 2023-12-09 (×4): qty 1

## 2023-12-09 MED ORDER — MAGNESIUM SULFATE 2 GM/50ML IV SOLN: 2.0000 g | Freq: Once | INTRAVENOUS | Status: DC

## 2023-12-09 MED ORDER — PANTOPRAZOLE SODIUM 40 MG PO TBEC
40.0000 mg | DELAYED_RELEASE_TABLET | Freq: Every day | ORAL | Status: DC
  Administered 2023-12-09 – 2023-12-12 (×4): 40 mg via ORAL
  Filled 2023-12-09 (×4): qty 1

## 2023-12-09 MED ORDER — SODIUM CHLORIDE 0.9 % IV BOLUS
1500.0000 mL | Freq: Once | INTRAVENOUS | Status: AC
  Administered 2023-12-09: 1500 mL via INTRAVENOUS

## 2023-12-09 MED ORDER — METOPROLOL SUCCINATE ER 50 MG PO TB24: 100.0000 mg | ORAL_TABLET | Freq: Every day | ORAL | Status: DC

## 2023-12-09 MED ORDER — NOREPINEPHRINE 4 MG/250ML-% IV SOLN
2.0000 ug/min | INTRAVENOUS | Status: DC
  Administered 2023-12-09: 2 ug/min via INTRAVENOUS
  Filled 2023-12-09: qty 250

## 2023-12-09 MED ORDER — CHLORHEXIDINE GLUCONATE CLOTH 2 % EX PADS
6.0000 | MEDICATED_PAD | Freq: Every day | CUTANEOUS | Status: DC
  Administered 2023-12-09 – 2023-12-10 (×2): 6 via TOPICAL

## 2023-12-09 MED ORDER — SENNOSIDES-DOCUSATE SODIUM 8.6-50 MG PO TABS: 1.0000 | ORAL_TABLET | Freq: Every evening | ORAL | Status: DC | PRN

## 2023-12-09 MED ORDER — MIDODRINE HCL 5 MG PO TABS
10.0000 mg | ORAL_TABLET | Freq: Three times a day (TID) | ORAL | Status: DC
  Administered 2023-12-09: 10 mg via ORAL
  Filled 2023-12-09: qty 2

## 2023-12-09 MED ORDER — CALCIUM GLUCONATE-NACL 2-0.675 GM/100ML-% IV SOLN
2.0000 g | Freq: Once | INTRAVENOUS | Status: AC
  Administered 2023-12-09: 2000 mg via INTRAVENOUS
  Filled 2023-12-09: qty 100

## 2023-12-09 MED ORDER — HYDROCORTISONE SOD SUC (PF) 100 MG IJ SOLR
100.0000 mg | Freq: Once | INTRAMUSCULAR | Status: AC
  Administered 2023-12-09: 100 mg via INTRAVENOUS
  Filled 2023-12-09: qty 2

## 2023-12-09 MED ORDER — VANCOMYCIN HCL 2000 MG/400ML IV SOLN
2000.0000 mg | Freq: Once | INTRAVENOUS | Status: AC
  Administered 2023-12-09: 2000 mg via INTRAVENOUS
  Filled 2023-12-09: qty 400

## 2023-12-09 MED ORDER — SODIUM CHLORIDE 0.9 % IV SOLN
2.0000 g | Freq: Three times a day (TID) | INTRAVENOUS | Status: DC
Start: 2023-12-09 — End: 2023-12-10
  Administered 2023-12-09 – 2023-12-10 (×4): 2 g via INTRAVENOUS
  Filled 2023-12-09 (×5): qty 12.5

## 2023-12-09 MED ORDER — HYDROCORTISONE SOD SUC (PF) 100 MG IJ SOLR
100.0000 mg | Freq: Two times a day (BID) | INTRAMUSCULAR | Status: DC
  Administered 2023-12-09: 100 mg via INTRAVENOUS
  Filled 2023-12-09: qty 2

## 2023-12-09 MED ORDER — INSULIN ASPART 100 UNIT/ML IJ SOLN
0.0000 [IU] | Freq: Three times a day (TID) | INTRAMUSCULAR | Status: DC
  Administered 2023-12-09: 11 [IU] via SUBCUTANEOUS

## 2023-12-09 MED ORDER — SODIUM PHOSPHATES 45 MMOLE/15ML IV SOLN
30.0000 mmol | Freq: Once | INTRAVENOUS | Status: AC
  Administered 2023-12-09: 30 mmol via INTRAVENOUS
  Filled 2023-12-09: qty 10

## 2023-12-09 MED ORDER — LEVOTHYROXINE SODIUM 50 MCG PO TABS
50.0000 ug | ORAL_TABLET | Freq: Every day | ORAL | Status: DC
  Administered 2023-12-09 – 2023-12-12 (×4): 50 ug via ORAL
  Filled 2023-12-09 (×4): qty 1

## 2023-12-09 MED ORDER — INSULIN ASPART 100 UNIT/ML IJ SOLN
3.0000 [IU] | INTRAMUSCULAR | Status: DC
  Administered 2023-12-10: 6 [IU] via SUBCUTANEOUS
  Administered 2023-12-10 (×3): 9 [IU] via SUBCUTANEOUS
  Administered 2023-12-10: 6 [IU] via SUBCUTANEOUS
  Administered 2023-12-10: 3 [IU] via SUBCUTANEOUS
  Filled 2023-12-09 (×6): qty 1

## 2023-12-09 MED ORDER — ALBUMIN HUMAN 25 % IV SOLN
INTRAVENOUS | Status: AC
  Filled 2023-12-09: qty 50

## 2023-12-09 MED ORDER — ALBUMIN HUMAN 25 % IV SOLN
25.0000 g | Freq: Once | INTRAVENOUS | Status: AC
  Administered 2023-12-09: 25 g via INTRAVENOUS
  Filled 2023-12-09: qty 100

## 2023-12-09 MED ORDER — MELATONIN 5 MG PO TABS
5.0000 mg | ORAL_TABLET | Freq: Every day | ORAL | Status: DC
  Administered 2023-12-09 – 2023-12-11 (×4): 5 mg via ORAL
  Filled 2023-12-09 (×4): qty 1

## 2023-12-09 MED ORDER — POTASSIUM CHLORIDE 20 MEQ PO PACK
20.0000 meq | PACK | Freq: Once | ORAL | Status: AC
Start: 2023-12-09 — End: 2023-12-09
  Administered 2023-12-09: 20 meq via ORAL
  Filled 2023-12-09: qty 1

## 2023-12-09 MED ORDER — ROSUVASTATIN CALCIUM 10 MG PO TABS
40.0000 mg | ORAL_TABLET | Freq: Every day | ORAL | Status: DC
  Administered 2023-12-09 – 2023-12-11 (×3): 40 mg via ORAL
  Filled 2023-12-09 (×4): qty 4

## 2023-12-09 NOTE — Evaluation (Signed)
Physical Therapy Evaluation Patient Details Name: Jason [name] MRN: [id number] DOB: Apr 28, 1951 Today's Date: 12/09/2023  History of Present Illness  Jason [name] is a 73 year old with a PMH significant for HTN, HLD, CAD/MI s/p [name], AICD, Diastolic Heart Failure, Afib on Eliquis, Diabetes Type 2, and Spinal Stenosis s/p OR 10/18/2024 (was recently discharged from [facility]) that presented to the ER with unresponsiveness and hypoglycemia.  Clinical Impression  Pt pleasant and interactive, eager to try and see what kind of mobility he can muster.  He struggled with getting to standing from recliner (b/l knees lack flexion to 90* and he struggled to get weight forward over BOS) but once assisted to standing he was able to walk ~85 ft with forward flexed, walker reliant, but safe gait.  Pt with some fatigue, HR up to nearly 120, O2 did remain >92% t/o ambulation.  Pt will benefit from continued PT to address functional limitations.        If plan is discharge home, recommend the following: A little help with walking and/or transfers;A little help with bathing/dressing/bathroom;Assistance with cooking/housework;Assist for transportation;Help with stairs or ramp for entrance   Can travel by private vehicle        Equipment Recommendations None recommended by PT  Recommendations for Other Services       Functional Status Assessment Patient has had a recent decline in their functional status and demonstrates the ability to make significant improvements in function in a reasonable and predictable amount of time.     Precautions / Restrictions Precautions Precautions: Fall Restrictions Weight Bearing Restrictions Per Provider Order: No      Mobility  Bed Mobility               General bed mobility comments: in recliner pre/post session    Transfers Overall transfer level: Needs assistance Equipment used: Rolling walker (2 wheels) Transfers: Sit to/from Stand, Bed to  chair/wheelchair/BSC Sit to Stand: Min assist           General transfer comment: Pt unable to rise from recliner (with pillows lifting seat) despite cuing for hand and foot placement and appropriate shift/sequencing.  Pt's knees lacked ROM to 90* and he needed direct assist to shift weight/hips forward enough to get over feet and COG over walker    Ambulation/Gait Ambulation/Gait assistance: Contact guard assist Gait Distance (Feet): 85 Feet Assistive device: Rolling walker (2 wheels)         General Gait Details: Once up pt was able to push himself for a more prolonged bout of ambulation.  He maintains forward lean on walker (Despite cuing to straighten) but did not have any LOBs, buckling or other overt safety issues.  His HR did rise from 90s to 110s but O2 stayed in the mid 90s and apart from some fatigue he ultimately did quite well.  Stairs            Wheelchair Mobility     Tilt Bed    Modified Rankin (Stroke Patients Only)       Balance Overall balance assessment: Needs assistance Sitting-balance support: No upper extremity supported, Feet supported Sitting balance-Leahy Scale: Good     Standing balance support: Bilateral upper extremity supported Standing balance-Leahy Scale: Fair                               Pertinent Vitals/Pain Pain Assessment Pain Assessment:  (reports chronic pain in b/l knees,  L>R)    Home Living Family/patient expects to be discharged to:: Private residence Living Arrangements: Spouse/significant other Available Help at Discharge: Family Type of Home: House Home Access: Stairs to enter   Secretary/administrator of Steps: 5   Home Layout: One level Home Equipment: Agricultural consultant (2 wheels) Additional Comments: 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 with dementia (high level cognitive deficits)    Prior Function Prior Level of Function : Needs assist             Mobility Comments: limited household ambulator using RW  since recent d/c from [facility] ADLs Comments: MOD I-I in ADL/IADL prior to surgery 16/10; increased assist for all ADL/IADL recently     Extremity/Trunk Assessment   Upper Extremity Assessment Upper Extremity Assessment: Generalized weakness    Lower Extremity Assessment Lower Extremity Assessment: Generalized weakness       Communication   Communication Communication: No apparent difficulties    Cognition Arousal: Alert Behavior During Therapy: [facility] for tasks assessed/performed                             Following commands: Intact       Cueing       General Comments      Exercises     Assessment/Plan    PT Assessment Patient needs continued PT services  PT Problem List Decreased strength;Decreased range of motion;Decreased activity tolerance;Decreased balance;Decreased mobility;Decreased knowledge of use of DME;Decreased safety awareness;Cardiopulmonary status limiting activity       PT Treatment Interventions DME instruction;Gait training;Functional mobility training;Therapeutic activities;Therapeutic exercise;Balance training;Stair training;Patient/family education    PT Goals (Current goals can be found in the Care Plan section)  Acute Rehab PT Goals Patient Stated Goal: get moving better and go home PT Goal Formulation: With patient Time For Goal Achievement: 12/09/23 Potential to Achieve Goals: Good    Frequency Min 1X/week     Co-evaluation               AM-PAC PT "6 Clicks" Mobility  Outcome Measure Help needed turning from your back to your side while in a flat bed without using bedrails?: A Little Help needed moving from lying on your back to sitting on the side of a flat bed without using bedrails?: A Little Help needed moving to and from a bed to a chair (including a wheelchair)?: A Little Help needed standing up from a chair using your arms (e.g., wheelchair or bedside chair)?: A Lot Help needed to walk in hospital room?: A  Little Help needed climbing 3-5 steps with a railing? : A Lot 6 Click Score: 16    End of Session Equipment Utilized During Treatment: Gait belt Activity Tolerance: Patient tolerated treatment well;Patient limited by fatigue Patient left: in chair;with call bell/phone within reach;with nursing/sitter in room Nurse Communication: Mobility status PT Visit Diagnosis: Muscle weakness (generalized) (M62.81);Difficulty in walking, not elsewhere classified (R26.2)    Time: 9604-5409 PT Time Calculation (min) (ACUTE ONLY): 25 min   Charges:   PT Evaluation $PT Eval Low Complexity: 1 Low PT Treatments $Gait Training: 8-22 mins PT General Charges $$ ACUTE PT VISIT: 1 Visit         Malachi Pro, DPT 12/09/2023, 3:28 PM

## 2023-12-09 NOTE — Consult Note (Signed)
PHARMACY CONSULT NOTE - ELECTROLYTES  Pharmacy Consult for Electrolyte Monitoring and Replacement   Recent Labs: Height: 6\' 4"  (193 cm) Weight: 106.6 kg (235 lb) IBW/kg (Calculated) : 86.8 Estimated Creatinine Clearance: 89 mL/min (by C-G formula based on SCr of 0.99 mg/dL). Potassium (mmol/L)  Date Value  12/09/2023 3.8   Magnesium (mg/dL)  Date Value  95/62/1308 1.8   Calcium (mg/dL)  Date Value  65/78/4696 7.6 (L)   Albumin (g/dL)  Date Value  29/52/8413 2.6 (L)   Phosphorus (mg/dL)  Date Value  24/40/1027 2.1 (L)   Sodium (mmol/L)  Date Value  12/09/2023 136   Corrected Ca: 8.44 mg/dL  Assessment  Jason [name] is a 73 y.o. male presenting with unresponsiveness and hypoglycemia. PMH significant for HTN, HLD, T2DM, CAD, [name], AICD, Atrial fibrillation(on apixaban PTA), spinal stenosis. Pharmacy has been consulted to monitor and replace electrolytes.  Diet: regular MIVF: D10 @ 60 mL/hr Pertinent medications: n/a  Goal of Therapy: Electrolytes WNL Corrected Calcium 8.6 (still being actively replaced)  Plan:  Replace with additional 20 mEq Kcl due history of heart disease  Replace with additional 2g Mg sulfate due to history of heart disease  No other replacement indicated at this time Check BMP, Mg, Phos with AM labs  Thank you for allowing pharmacy to be a part of this patient's care.  Effie Shy, PharmD Pharmacy Resident  12/09/2023 1:31 PM

## 2023-12-09 NOTE — H&P (Signed)
NAME:  Jason [name], MRN:  [id number], DOB:  May 17, 1951, LOS: 0 ADMISSION DATE:  12/08/2023, CONSULTATION DATE:  12/09/2023 REFERRING MD:  CHIEF COMPLAINT:  Septic Shock   History of Present Illness:   Jason [name] is a 73 year old with a PMH significant for HTN, HLD, CAD/MI s/p [name], AICD, Diastolic Heart Failure, Afib on Eliquis, Diabetes Type 2, and Spinal Stenosis s/p OR 10/18/2024 (was recently discharged from [facility]) that presented to the ER with unresponsiveness and hypoglycemia. To review, according to chart review,  patient was last known normal last evening, denies any recent illness or taking extra medications, last meal was at dinner 6 pm. He went to bed feeling fine, wife found him unresponsive and called EMS, glucose was 33, he was given 250 ml D10.   Upon arrival to the ER he was Alert and Oriented x 4. Afebrile 98.4. Labs revealed WBC 11.7, UA +, Negative PCR for COVID, flu and RSV, BNP 343.6, K 3.3, Mag 1.2, Phos 3.2, Plts 108. Patient was started on Ceftriaxone, which I will broaden to Vancomycin and Zosyn pending cultures.  While in the ER he developed a fever Tmax 39.4, became hypotensive despite 3.4 liters IVF and albumin x 1. Ultimately required Norepinephrine infusion. He remained hypoglycemic, s/p D50 x 2, and initiation of Dextrose infusion. In addition patient was given a meal which he completed. Head CT was completed without acute process noted.  PCCM is consulted for admission due to Septic shock and refractory Hypoglycemia   Pertinent  Medical History   HTN, HLD, CAD/MI s/p [name], AICD, Diastolic Heart Failure, Afib on Eliquis, Diabetes Type 2  Significant Hospital Events: Including procedures, antibiotic start and stop dates in addition to other pertinent events   2/13: Septic shock 2/2 UTI, cultures pending. 3.5 liters, Norepinephrine. Abx. Hypoglycemic on dextrose infusion  Interim History / Subjective:   Objective   Blood pressure 103/69, pulse 86, temperature  99.1 F (37.3 C), temperature source Rectal, resp. rate (!) 24, height 6\' 4"  (1.93 m), weight 106.6 kg, SpO2 95%.        Intake/Output Summary (Last 24 hours) at 12/09/2023 0554 Last data filed at 12/09/2023 0420 Gross per 24 hour  Intake 2347.52 ml  Output --  Net 2347.52 ml   Filed Weights   12/08/23 1538  Weight: 106.6 kg   Physical Exam Vitals reviewed.  HENT:     Head: Normocephalic and atraumatic.     Right Ear: External ear normal.     Left Ear: External ear normal.     Nose: Congestion present.     Mouth/Throat:     Mouth: Mucous membranes are moist.  Eyes:     Extraocular Movements: Extraocular movements intact.     Conjunctiva/sclera: Conjunctivae normal.     Pupils: Pupils are equal, round, and reactive to light.  Cardiovascular:     Pulses: Normal pulses.     Comments: Afib Bilateral lower extremity generalized edema Pulmonary:     Effort: Pulmonary effort is normal.     Breath sounds: Normal breath sounds.  Abdominal:     General: Bowel sounds are normal.     Palpations: Abdomen is soft.  Musculoskeletal:        General: Normal range of motion.     Cervical back: Normal range of motion.  Skin:    General: Skin is warm and dry.     Capillary Refill: Capillary refill takes less than 2 seconds.  Neurological:  General: No focal deficit present.     Mental Status: He is alert.  Psychiatric:        Mood and Affect: Mood normal.    Resolved Hospital Problem list   None  Assessment & Plan:  Septic Shock suspect due to UTI  Patient now Hypotensive SBP < 90, requiring vasopressors, tachycardic, tachypnic, febrile, lactic acidosis -s/p 3.5 liters + Albumin -MAP goal > 65  -Levophed infusion, titrate to achieve goal Cultures/labs: -UA +, cx pending -Respiratory panel negative -Pending: BC x 2, MRSA swab -Trend lactic until clears, Procalcitonin 0.14 Antibiotics: -Ceftriaxone started in ER, will broaden to Vancomycin and Cefepime as patient has  declined (allergy to PCN) -Solucortef 100 mg BID (initiated in ER)  Type 2 Diabetes Hypoglycemia  Unresponsiveness due to above -Hold home Farxiga, Metformin -s/p 2 amp D50 in ER -D10 infusion @ 125 ml/hr -Glucose checks Q 1 hour  Hypotension due to Septic Shock Atrial Fibrillation on Eliquis Chronic Diastolic HF; Echo 09/2023 EF 50-55%  Hx: HTN, HLD, CAD/MI s/p [name] -Levophed as above -Midodrine started in ER, will hold on continue for now -Hold home Metoprolol, Enstresto, Demadex -Continue Amiodarone, Crestor, and Eliquis  Metabolic Derangements; K 3.3, Mg 1.2, Ca 7.0 -Replace Magnesium w/ 4 grams, Calcium 2 grams -Repeat labs at 10 am  Hypothyroidism -Continue home Synthroid  Thrombocytopenia, Chronic -Platelets 108, trend daily  Best Practice (right click and "Reselect all SmartList Selections" daily)   Diet/type: General diet DVT prophylaxis SCDs, on Eliquis at home Pressure ulcer(s): Stage 1 coccyx - see media GI prophylaxis: PPI Lines: N/A Foley:  N/A Code Status:  full code Last date of multidisciplinary goals of care discussion [Patient updated on admission]  Labs   CBC: Recent Labs  Lab 12/08/23 1559 12/09/23 0402  WBC 11.7* 8.8  NEUTROABS 10.3*  --   HGB 10.5* 8.0*  HCT 31.9* 25.3*  MCV 91.4 92.3  PLT 193 108*   Basic Metabolic Panel: Recent Labs  Lab 12/08/23 1559 12/09/23 0402  NA 136 134*  K 3.3* 3.5  CL 99 104  CO2 25 22  GLUCOSE 146* 69*  BUN 19 16  CREATININE 1.07 0.95  CALCIUM 7.9* 7.0*  MG 1.2* 1.3*  PHOS 3.2  --    GFR: Estimated Creatinine Clearance: 92.8 mL/min (by C-G formula based on SCr of 0.95 mg/dL). Recent Labs  Lab 12/08/23 1559 12/08/23 2146 12/09/23 0012 12/09/23 0402  PROCALCITON 0.14  --   --   --   WBC 11.7*  --   --  8.8  LATICACIDVEN  --  2.3* 0.9  --    HbA1C: Hgb A1c MFr Bld  Date/Time Value Ref Range Status  10/13/2023 12:00 PM 5.8 (H) 4.8 - 5.6 % Final    Comment:    (NOTE) Pre diabetes:           5.7%-6.4%  Diabetes:              >6.4%  Glycemic control for   <7.0% adults with diabetes   04/13/2016 06:37 PM 7.6 (H) 4.8 - 5.6 % Final    Comment:    (NOTE)         Pre-diabetes: 5.7 - 6.4         Diabetes: >6.4         Glycemic control for adults with diabetes: <7.0    CBG: Recent Labs  Lab 12/08/23 2342 12/09/23 0035 12/09/23 0219 12/09/23 0251 12/09/23 0549  GLUCAP 98 46* 34* 88 96  Review of Systems:   Denies chest pain, shortness of breath, abdominal pain, nausea, vomiting. Complains of shoulder and knee pain  Past Medical History:  He,  has a past medical history of AICD (automatic cardioverter/defibrillator) present, Arthritis, CHF (congestive heart failure) (HCC), Coronary artery disease involving native coronary artery with unstable angina pectoris (HCC) (04/13/2016), Coronary artery disease with history of myocardial infarction without history of [name], Cough, Diabetes mellitus without complication (HCC), Edema, Essential hypertension, Hypertension, Hypothyroidism, Left main coronary artery disease (04/13/2016), Mixed hyperlipidemia, Obesity, PAF (paroxysmal atrial fibrillation) (HCC), S/P [name] (04/14/2016), Type II diabetes mellitus (HCC), and Unstable angina (HCC) (04/13/2016).   Surgical History:   Past Surgical History:  Procedure Laterality Date   BACK SURGERY     Lumbar Surgeries. [facility], [place]. Dr. Elesa Hacker x2   CARDIAC CATHETERIZATION Left 04/13/2016   Procedure: Left Heart Cath and Coronary Angiography;  Surgeon: [name], MD;  Location: [facility] INVASIVE CV LAB;  Service: Cardiovascular;  Laterality: Left;   CARDIOVERSION N/A 08/02/2018   Procedure: CARDIOVERSION;  Surgeon: [name], MD;  Location: [facility] ORS;  Service: Cardiovascular;  Laterality: N/A;   CATARACT EXTRACTION W/PHACO Right 01/23/2016   Procedure: CATARACT EXTRACTION PHACO AND INTRAOCULAR LENS PLACEMENT (IOC);  Surgeon: Galen Manila, MD;  Location: [facility]  ORS;  Service: Ophthalmology;  Laterality: Right;  [place] 01:18 AP% 23.8 CDE 18.67 fluid pack lot # [id number]   CHOLECYSTECTOMY     CORONARY ARTERY BYPASS GRAFT N/A 04/14/2016   Procedure: CORONARY ARTERY BYPASS GRAFTING TIMES FOUR    USING LEFT INTERNAL MAMMARY AND ENDOSCOPIC HARVEST RIGHT SAPHENOUS VEIN;  Surgeon: Purcell Nails, MD;  Location: [facility] OR;  Service: Open Heart Surgery;  Laterality: N/A;   EYE SURGERY Bilateral    Cataract Extraction with IOL implant.   INTRAOPERATIVE TRANSESOPHAGEAL ECHOCARDIOGRAM N/A 04/14/2016   Procedure: INTRAOPERATIVE TRANSESOPHAGEAL ECHOCARDIOGRAM;  Surgeon: Purcell Nails, MD;  Location: [facility] OR;  Service: Open Heart Surgery;  Laterality: N/A;   KNEE ARTHROSCOPY Left 10/08/2015   Procedure: ARTHROSCOPY KNEE, PARTIAL MEDIAL AND LATERAL MENISECTOMY, REMOVAL OF FOREIGN BODY;  Surgeon: Kennedy Bucker, MD;  Location: [facility] ORS;  Service: Orthopedics;  Laterality: Left;   LUMBAR LAMINECTOMY/DECOMPRESSION MICRODISCECTOMY Bilateral 10/18/2023   Procedure: Lumbar Three-Four, Lumbar Four-Five  Laminotomy, Foraminotomy;  Surgeon: Tressie Stalker, MD;  Location[facility] OR;  Service: Neurosurgery;  Laterality: Bilateral;  3C    Social History:   reports that he has never smoked. His smokeless tobacco use includes chew. He reports that he does not drink alcohol and does not use drugs.   Family History:  His family history includes Aortic stenosis in his father; Heart disease in his father; Hypertension in his father.   Allergies Allergies  Allergen Reactions   Aspirin Anaphylaxis   Inderal [Propranolol]     Other reaction(s): Other (See Comments) Fatigue   Metformin Hcl Diarrhea    Pt still takes medication   Mevacor [Lovastatin] Other (See Comments)    muscle Pain   Penicillin G Hives   Pravachol [Pravastatin Sodium] Other (See Comments)    Muscle Pain   Zocor [Simvastatin] Other (See Comments)    Muscle Pain   Penicillins Rash    Has patient had a PCN reaction  causing immediate rash, facial/tongue/throat swelling, SOB or lightheadedness with hypotension: Yes Has patient had a PCN reaction causing severe rash involving mucus membranes or skin necrosis: Yes Has patient had a PCN reaction that required hospitalization No Has patient had a PCN reaction occurring  within the last 10 years: Yes If all of the above answers are "NO", then may proceed with Cephalosporin use.     Home Medications  Prior to Admission medications   Medication Sig Start Date End Date Taking? Authorizing Provider  acetaminophen (TYLENOL) 500 MG tablet Take 500-1,000 mg by mouth every 6 (six) hours as needed (pain.).   Yes [provider]  amiodarone (PACERONE) 200 MG tablet Take 100 mg by mouth in the morning.   Yes [provider]  apixaban (ELIQUIS) 5 MG TABS tablet Take 1 tablet (5 mg total) by mouth 2 (two) times daily. 10/25/23  Yes Patrici Ranks Caylin, PA-C  dapagliflozin propanediol (FARXIGA) 10 MG TABS tablet Take 10 mg by mouth in the morning.   Yes [provider]  gabapentin (NEURONTIN) 100 MG capsule Take 100 mg by mouth at bedtime as needed (nerve pain.). 06/30/23  Yes [provider]  levothyroxine (SYNTHROID) 50 MCG tablet Take 50 mcg by mouth daily at 2 am. 08/08/23  Yes [provider]  melatonin 5 MG TABS Take 1 tablet (5 mg total) by mouth at bedtime. 11/04/23  Yes Marolyn Haller, MD  metFORMIN (GLUCOPHAGE-XR) 500 MG 24 hr tablet Take 1 tablet (500 mg total) by mouth 2 (two) times daily with a meal. 11/04/23  Yes Marolyn Haller, MD  metoprolol succinate (TOPROL-XL) 100 MG 24 hr tablet Take 100 mg by mouth in the morning. Take with or immediately following a meal.   Yes [provider]  Nystatin (GERHARDT'S BUTT CREAM) CREA Apply 1 Application topically 2 (two) times daily. 11/04/23  Yes Marolyn Haller, MD  rosuvastatin (CRESTOR) 40 MG tablet Take 20 mg by mouth at bedtime. 07/01/23  Yes [provider]  sacubitril-valsartan (ENTRESTO) 49-51 MG Take 1 tablet by mouth 2 (two) times daily.   Yes [provider]  torsemide (DEMADEX) 20 MG tablet Take 20 mg by mouth in the morning. 11/23/22  Yes [provider]  docusate sodium (COLACE) 100 MG capsule Take 1 capsule (100 mg total) by mouth 2 (two) times daily. Patient not taking: Reported on 12/09/2023 10/19/23 10/18/24  Clovis Riley, PA-C  Ensure Max Protein (ENSURE MAX PROTEIN) LIQD Take 330 mLs (11 oz total) by mouth 2 (two) times daily. 11/04/23   Marolyn Haller, MD  lidocaine (LIDODERM) 5 % Place 1 patch onto the skin daily. Remove & Discard patch within 12 hours or as directed by MD Patient not taking: Reported on 12/09/2023 11/04/23   Marolyn Haller, MD  Multiple Vitamin (MULTIVITAMIN WITH MINERALS) TABS tablet Take 1 tablet by mouth daily with lunch. Patient not taking: Reported on 12/09/2023 11/05/23   Marolyn Haller, MD  pantoprazole (PROTONIX) 40 MG tablet Take 1 tablet (40 mg total) by mouth daily. 11/05/23   Marolyn Haller, MD  senna-docusate (SENOKOT-S) 8.6-50 MG tablet Take 1 tablet by mouth at bedtime as needed for moderate constipation. 11/04/23   Marolyn Haller, MD     Critical care time: 75 minutes

## 2023-12-09 NOTE — Progress Notes (Signed)
Admitted pt. From ED per hospital bed, pt. Alert and oriented, on room air .Reoriented to room and all facilities available. Needs attended.Report given to am nurse

## 2023-12-09 NOTE — Progress Notes (Signed)
Reviewed patient's emergency contacts with patient Quantel Mcinturff.   Terik advised that his wife, Hadrian Yarbrough, has dementia and would not be an emergency contact due to this as well as his brother Jaquawn Saffran lives out of state.  Rande Lawman requested his emergency contacts be updated to remove Alfonzo Feller and Sheldon Silvan and add his grandson Marval Regal and Grand-Daughter in-Law Domenic Polite as first and second emergency contacts.   Hailey requested his Cousin Miko Sirico remain as an emergency contact as he is local here in [place].  Updated emergency contacts accordingly.

## 2023-12-09 NOTE — Consult Note (Signed)
PHARMACY CONSULT NOTE - ELECTROLYTES  Pharmacy Consult for Electrolyte Monitoring and Replacement   Recent Labs: Height: 6\' 4"  (193 cm) Weight: 106.6 kg (235 lb) IBW/kg (Calculated) : 86.8 Estimated Creatinine Clearance: 92.8 mL/min (by C-G formula based on SCr of 0.95 mg/dL). Potassium (mmol/L)  Date Value  12/09/2023 3.5   Magnesium (mg/dL)  Date Value  25/36/6440 1.3 (L)   Calcium (mg/dL)  Date Value  34/74/2595 7.0 (L)   Albumin (g/dL)  Date Value  63/87/5643 2.2 (L)   Phosphorus (mg/dL)  Date Value  32/95/1884 3.2   Sodium (mmol/L)  Date Value  12/09/2023 134 (L)   Corrected Ca: 8.44 mg/dL  Assessment  Jason [name] is a 73 y.o. male presenting with unresponsiveness and hypoglycemia. PMH significant for HTN, HLD, T2DM, CAD, [name], AICD, Atrial fibrillation(on apixaban PTA), spinal stenosis. Pharmacy has been consulted to monitor and replace electrolytes.  Diet: regular MIVF: D10 @ 125 mL/hr Pertinent medications: n/a  Goal of Therapy: Electrolytes WNL  Plan:  Mag 1.3: Mag sulfate 2g IV x 1 ordered by CCNP Corrected Ca 8.44: Calcium gluconate 2g IV x 1 ordered by CCNP Will defer any further replacement currently Check BMP, Mg, Phos with AM labs  Thank you for allowing pharmacy to be a part of this patient's care.  Bettey Costa, PharmD Clinical Pharmacist 12/09/2023 5:54 AM

## 2023-12-09 NOTE — Evaluation (Signed)
Occupational Therapy Evaluation Patient Details Name: Jason [name] MRN: [id number] DOB: 1950/12/06 Today's Date: 12/09/2023   History of Present Illness   Jason [name] is a 73 year old with a PMH significant for HTN, HLD, CAD/MI s/p [name], AICD, Diastolic Heart Failure, Afib on Eliquis, Diabetes Type 2, and Spinal Stenosis s/p OR 10/18/2024 (was recently discharged from [facility]) that presented to the ER with unresponsiveness and hypoglycemia.     Clinical Impressions Jason [name] was seen for OT evaluation this date. Prior to hospital admission, pt was MOD I for limited household mobility. Pt lives with spouse with dementia. SUPERVISION for bed mobility and don B socks. MIN A + RW sit<>stand improves to CGA bed>chair pivot t/f. Requires BUE support in standing, grooming tasks MOD I in sitting. Pt would benefit from skilled OT to address noted impairments and functional limitations (see below for any additional details). Upon hospital discharge, recommend OT follow up and assist for IADLs.     If plan is discharge home, recommend the following:   A little help with walking and/or transfers;A little help with bathing/dressing/bathroom;Help with stairs or ramp for entrance     Functional Status Assessment   Patient has had a recent decline in their functional status and demonstrates the ability to make significant improvements in function in a reasonable and predictable amount of time.     Equipment Recommendations   BSC/3in1     Recommendations for Other Services         Precautions/Restrictions   Precautions Precautions: Fall Recall of Precautions/Restrictions: Intact Restrictions Weight Bearing Restrictions Per Provider Order: No     Mobility Bed Mobility Overal bed mobility: Needs Assistance Bed Mobility: Supine to Sit     Supine to sit: Supervision          Transfers Overall transfer level: Needs assistance Equipment used: Rolling walker (2  wheels) Transfers: Sit to/from Stand, Bed to chair/wheelchair/BSC Sit to Stand: Min assist     Step pivot transfers: Contact guard assist            Balance Overall balance assessment: Needs assistance Sitting-balance support: No upper extremity supported, Feet supported Sitting balance-Leahy Scale: Good     Standing balance support: Bilateral upper extremity supported Standing balance-Leahy Scale: Fair                             ADL either performed or assessed with clinical judgement   ADL Overall ADL's : Needs assistance/impaired                                       General ADL Comments: SBA don B socks in sitting. MIN A + RW for simulated BSC t/f.      Pertinent Vitals/Pain Pain Assessment Pain Assessment: No/denies pain     Extremity/Trunk Assessment Upper Extremity Assessment Upper Extremity Assessment: Generalized weakness   Lower Extremity Assessment Lower Extremity Assessment: Generalized weakness       Communication Communication Communication: No apparent difficulties   Cognition Arousal: Alert Behavior During Therapy: WFL for tasks assessed/performed Cognition: No apparent impairments                               Following commands: Intact       Cueing  General Comments  Exercises     Shoulder Instructions      Home Living Family/patient expects to be discharged to:: Private residence Living Arrangements: Spouse/significant other Available Help at Discharge: Family Type of Home: House Home Access: Stairs to enter Secretary/administrator of Steps: 5   Home Layout: One level               Home Equipment: Agricultural consultant (2 wheels)   Additional Comments: 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 with dementia (high level cognitive deficits)      Prior Functioning/Environment Prior Level of Function : Needs assist             Mobility Comments: limited household ambulator using RW  since recent d/c from [facility]      OT Problem List: Decreased strength;Decreased activity tolerance   OT Treatment/Interventions: Self-care/ADL training;Therapeutic exercise;Energy conservation;DME and/or AE instruction;Therapeutic activities      OT Goals(Current goals can be found in the care plan section)   Acute Rehab OT Goals Patient Stated Goal: to go home OT Goal Formulation: With patient Time For Goal Achievement: 12/23/23 Potential to Achieve Goals: Good ADL Goals Pt Will Perform Grooming: with modified independence;standing Pt Will Perform Lower Body Dressing: with modified independence;sit to/from stand Pt Will Transfer to Toilet: with modified independence;ambulating   OT Frequency:  Min 1X/week    Co-evaluation              AM-PAC OT "6 Clicks" Daily Activity     Outcome Measure Help from another person eating meals?: None Help from another person taking care of personal grooming?: A Little Help from another person toileting, which includes using toliet, bedpan, or urinal?: A Little Help from another person bathing (including washing, rinsing, drying)?: A Little Help from another person to put on and taking off regular upper body clothing?: None Help from another person to put on and taking off regular lower body clothing?: A Little 6 Click Score: 20   End of Session Equipment Utilized During Treatment: Rolling walker (2 wheels);Gait belt Nurse Communication: Mobility status  Activity Tolerance: Patient tolerated treatment well Patient left: in chair;with call bell/phone within reach  OT Visit Diagnosis: Other abnormalities of gait and mobility (R26.89);Muscle weakness (generalized) (M62.81)                Time: 1610-9604 OT Time Calculation (min): 28 min Charges:  OT General Charges $OT Visit: 1 Visit OT Evaluation $OT Eval Moderate Complexity: 1 Mod OT Treatments $Self Care/Home Management : 8-22 mins  Kathie Dike, M.S. OTR/L  12/09/23, 2:59 PM   ascom [phone]

## 2023-12-09 NOTE — Progress Notes (Signed)
[facility] Progress Note  Patient: Jason [name]    ZOX:[id number]  DOA: 12/08/2023     Date of Service: the patient was seen and examined on 12/09/2023  Chief Complaint  Patient presents with   Hypoglycemia   Brief hospital course: Jason [name] is a 73 y.o. male with medical history significant of hypertension, hyperlipidemia, diabetes mellitus, CAD, [name], AICD, diastolic CHF, hypothyroidism, A-fib on Eliquis, spinal stenosis, who presents with unresponsiveness and hypoglycemia.   Per report, pt was found to be unresponsive at home.  He was normal last night.  Per EMS report, patient had CBG of 33. Pt was given 1 bag of D10 in 250 mL.  His mental status is back to baseline at arrival to ED.  Patient feels cold, denies fall or any injury.  No chest pain, cough, SOB.  No nausea, vomiting, diarrhea or abdominal pain.  He reports burning with urination, denies dysuria.  No hematuria.   Data reviewed independently and ED Course: pt was found to have WBC 11.7, BNP 343.6,  positive urinalysis (cloudy appearance, large amount of leukocyte, few bacteria, WBC> 50), negative PCR for COVID, flu and RSV, potassium 3.3, magnesium 1.2, phosphorus 3.2, GFR> 60, temperature normal, blood pressure 124/62, heart rate of 110, RR 24, oxygen saturation 93%-95% on room air.  Patient is placed to PCU for observation.     EKG: I have personally reviewed.  A-fib, poor R wave progression, with artificial effects     Assessment and Plan:  Severe sepsis secondary to UTI fever Tmax 102.9, HR 120, RR 24, lactic acid 2.3, Bp dropped 65/57  S/p IV fluid bolus given in the ED. lactic acid trended down Patient was started on Solu-Cortef 100 mg IV daily Started midodrine 10 mg p.o. 3 times daily S/p albumin 25 g Blood pressure did not improve, patient was transferred to stepdown unit Started Levophed Continue to monitor on telemetry, Monitor BP and titrate Levophed accordingly   UTI (urinary tract  infection) S/p IV Rocephin Continue cefepime and vancomycin Follow blood cultures -Follow-up urine culture growing Klebsiella pneumonia, sensitivity pending   Hypoglycemia: Blood sugar down to 33, patient was given D50 and started on D10, still persistently hypoglycemic.  Likely due to poor oral intake and ongoing infection with UTI.  Mental status is back to baseline.  -Hold home diabetic medications -CBG every hour -D50 as needed -s/p D10 at 100 cc/h, d/c'd on 2/13 due to hyperglycemia   # Diabetes mellitus without complication: Recent A1c 5.8, well-controlled.  Patient is taking [place] and metformin at home. -Hold home medications due to hypoglycemia 2/13 hypoglycemia resolved, BG elevated, started NovoLog sliding scale Monitor CBG   # Hypokalemia, resolved # Hypophosphatemia due to nutritional deficiency, Phos repleted. # Hypomagnesemia, mag repleted. Monitor electrolytes and replete as needed.   # A-fib, CAD stable [name], diastolic CHF, status post AICD Resumed Eliquis 5 mg p.o. twice daily, amiodarone 100 mg p.o. daily Crestor 40 mg p.o. daily BNP 343 No signs of volume overload  # Hypothyroid, continue Synthroid 50 mcg p.o. daily  # Unresponsiveness most likely secondary to hypoglycemia Currently patient is back to baseline CT head negative for any acute findings   Body mass index is 28.61 kg/m.  Interventions:  Pressure Injury 10/27/23 Buttocks Right;Left small open red areas (Active)  10/27/23 0800  Location: Buttocks  Location Orientation: Right;Left  Staging:   Wound Description (Comments): small open red areas  Present on Admission: No     Pressure Injury 12/09/23  Buttocks Right;Left Stage 1 -  Intact skin with non-blanchable redness of a localized area usually over a bony prominence. red and  blanchable /pictures was taken (Active)  12/09/23 0645  Location: Buttocks  Location Orientation: Right;Left  Staging: Stage 1 -  Intact skin with  non-blanchable redness of a localized area usually over a bony prominence.  Wound Description (Comments): red and  blanchable /pictures was taken  Present on Admission: Yes  Dressing Type Foam - Lift dressing to assess site every shift 12/09/23 0800     Diet: Carb modified diet DVT Prophylaxis: Therapeutic Anticoagulation with Eliquis    Advance goals of care discussion: Full code  Family Communication: family was not present at bedside, at the time of interview.  The pt provided permission to discuss medical plan with the family. Opportunity was given to ask question and all questions were answered satisfactorily.   Disposition:  Pt is from Home, admitted with sepsis, UTI, still on IV abx, which precludes a safe discharge. Discharge to Home, when stable, may need 2 to 3 days to improve.  Subjective: No significant events overnight, patient was sitting already in the recliner in the morning time, denied any chest pain or palpitation no shortness of breath, no abdominal pain.   Physical Exam: General: NAD, lying comfortably Appear in no distress, affect appropriate Eyes: PERRLA ENT: Oral Mucosa Clear, moist  Neck: no JVD,  Cardiovascular: S1 and S2 Present, no Murmur,  Respiratory: good respiratory effort, Bilateral Air entry equal and Decreased, no Crackles, no wheezes Abdomen: Bowel Sound present, Soft and no tenderness,  Skin: no rashes Extremities: no Pedal edema, no calf tenderness Neurologic: without any new focal findings Gait not checked due to patient safety concerns  Vitals:   12/09/23 1430 12/09/23 1500 12/09/23 1530 12/09/23 1600  BP: 124/66 104/62 108/67 112/61  Pulse: 93 78 82 77  Resp: 19 (!) 21 (!) 23 (!) 21  Temp:    97.7 F (36.5 C)  TempSrc:    Oral  SpO2: 97% 99% 98% 98%  Weight:      Height:        Intake/Output Summary (Last 24 hours) at 12/09/2023 1715 Last data filed at 12/09/2023 1600 Gross per 24 hour  Intake 4475.54 ml  Output 1500 ml  Net  2975.54 ml   Filed Weights   12/08/23 1538  Weight: 106.6 kg    Data Reviewed: I have personally reviewed and interpreted daily labs, tele strips, imagings as discussed above. I reviewed all nursing notes, pharmacy notes, vitals, pertinent old records I have discussed plan of care as described above with RN and patient/family.  CBC: Recent Labs  Lab 12/08/23 1559 12/09/23 0402  WBC 11.7* 8.8  NEUTROABS 10.3*  --   HGB 10.5* 8.0*  HCT 31.9* 25.3*  MCV 91.4 92.3  PLT 193 108*   Basic Metabolic Panel: Recent Labs  Lab 12/08/23 1559 12/09/23 0402 12/09/23 1108 12/09/23 1109  NA 136 134*  --  136  K 3.3* 3.5  --  3.8  CL 99 104  --  107  CO2 25 22  --  23  GLUCOSE 146* 69*  --  181*  BUN 19 16  --  14  CREATININE 1.07 0.95  --  0.99  CALCIUM 7.9* 7.0*  --  7.6*  MG 1.2* 1.3* 1.8  --   PHOS 3.2 2.1*  --   --     Studies: US RENAL Result Date: 12/09/2023 CLINICAL DATA:  UTI EXAM:  RENAL / URINARY TRACT ULTRASOUND COMPLETE COMPARISON:  X-ray 10/27/2023.  CT 10/25/2023. FINDINGS: Right Kidney: Renal measurements: 13.0 x 5.3 x 6.0 cm = volume: 218 mL. No collecting system dilatation or perinephric fluid. Upper pole right-sided renal cyst identified. Anechoic structure with transmission measuring 3 cm. Smaller focus towards the lower pole measuring 2.4 cm also appears simple. Left Kidney: Renal measurements: 13.3 x 6.4 x 4.9 cm = volume: 218 mL. No collecting system dilatation. Anechoic structure seen in the left kidney measuring 2.5 cm consistent with a simple cyst. Bladder: Preserved contour to the urinary bladder. Prevoid volume of 295 cc. Postvoid 121 cc. There is some mild bladder wall thickening. Other: None. IMPRESSION: No collecting system dilatation.  Bilateral renal cysts. Moderate postvoid bladder residual.  Mild bladder wall thickening. Electronically Signed   By: Karen Kays M.D.   On: 12/09/2023 14:51   CT HEAD WO CONTRAST ( ) Result Date: 12/09/2023 CLINICAL  DATA:  Syncope/presyncope, cerebrovascular cause suspected EXAM: CT HEAD WITHOUT CONTRAST TECHNIQUE: Contiguous axial images were obtained from the base of the skull through the vertex without intravenous contrast. RADIATION DOSE REDUCTION: This exam was performed according to the departmental dose-optimization program which includes automated exposure control, adjustment of the mA and/or kV according to patient size and/or use of iterative reconstruction technique. COMPARISON:  10/25/2023 FINDINGS: Brain: Mild age related volume loss. No acute intracranial abnormality. Specifically, no hemorrhage, hydrocephalus, mass lesion, acute infarction, or significant intracranial injury. Vascular: No hyperdense vessel or unexpected calcification. Skull: No acute calvarial abnormality. Sinuses/Orbits: Mucosal thickening in the maxillary sinuses. No air-fluid levels. Other: None IMPRESSION: No acute intracranial abnormality. Chronic maxillary sinusitis. Electronically Signed   By: Charlett Nose M.D.   On: 12/09/2023 01:08    Scheduled Meds:  amiodarone  100 mg Oral Daily   apixaban  5 mg Oral BID   Chlorhexidine Gluconate Cloth  6 each Topical Daily   hydrocortisone sod succinate (SOLU-CORTEF) inj  100 mg Intravenous Q12H   levothyroxine  50 mcg Oral QAC breakfast   melatonin  5 mg Oral QHS   pantoprazole  40 mg Oral Daily   rosuvastatin  40 mg Oral QHS   Continuous Infusions:  ceFEPime (MAXIPIME) IV 2 g (12/09/23 1651)   dextrose Stopped (12/09/23 1257)   norepinephrine (LEVOPHED) Adult infusion Stopped (12/09/23 1343)   sodium PHOSPHATE IVPB (in mmol) 43 mL/hr at 12/09/23 1600   PRN Meds: acetaminophen, dextrose, docusate sodium, gabapentin, ondansetron (ZOFRAN) IV, mouth rinse, senna-docusate  Time spent: 55 minutes  Author: Gillis Santa. MD [facility] 12/09/2023 5:15 PM  To reach On-call, see care teams to locate the attending and reach out to them via [email or web address]. If 7PM-7AM, please  contact night-coverage If you still have difficulty reaching the attending provider, please page the [facility] (Director on Call) for [facility] on amion for assistance.

## 2023-12-09 NOTE — Progress Notes (Signed)
eLink Physician-Brief Progress Note Patient Name: Jason [name] DOB: 03/23/1951 MRN: [id number]   Date of Service  12/09/2023  HPI/Events of Note  73 y.o. male with medical history significant of hypertension, hyperlipidemia, diabetes mellitus, CAD, [name], AICD, diastolic CHF, hypothyroidism, A-fib on Eliquis, spinal stenosis, who presents with unresponsiveness and hypoglycemia.  Septic shock from urinary tract infection.  Initially presented febrile and tachypneic, tachycardic.  No patient is normalized except persistent tachypnea.  Saturating 99% on room air.  Results consistent with intermittent hypoglycemia.  Metabolic panel with mild electrolyte disturbances.  Hemoglobin low at 8.0.    eICU Interventions  Maintain antibiotics with vancomycin and cefepime.  Initiated norepinephrine as needed to maintain MAP greater than 65.  Status post significant crystalloid infusion and albumin bolus.  Please continue dextrose infusion.  Add every 6 metabolic panel.  DVT prophylaxis apixaban GI Prophylaxis with home PPI     Intervention Category Evaluation Type: New Patient Evaluation  [name] 12/09/2023, 6:51 AM

## 2023-12-10 DIAGNOSIS — E162 Hypoglycemia, unspecified: Secondary | ICD-10-CM | POA: Diagnosis not present

## 2023-12-10 LAB — URINE CULTURE

## 2023-12-10 LAB — BASIC METABOLIC PANEL
Anion gap: 5 (ref 5–15)
BUN: 17 mg/dL (ref 8–23)
CO2: 24 mmol/L (ref 22–32)
Calcium: 7.9 mg/dL — ABNORMAL LOW (ref 8.9–10.3)
Chloride: 109 mmol/L (ref 98–111)
Creatinine, Ser: 0.94 mg/dL (ref 0.61–1.24)
GFR, Estimated: >60 mL/min (ref >60)
Glucose, Bld: 173 mg/dL — ABNORMAL HIGH (ref 70–99)
Potassium: 3.7 mmol/L (ref 3.5–5.1)
Sodium: 138 mmol/L (ref 135–145)

## 2023-12-10 LAB — CBC
HCT: 24.7 % — ABNORMAL LOW (ref 39.0–52.0)
Hemoglobin: 7.8 g/dL — ABNORMAL LOW (ref 13.0–17.0)
MCH: 28.9 pg (ref 26.0–34.0)
MCHC: 31.6 g/dL (ref 30.0–36.0)
MCV: 91.5 fL (ref 80.0–100.0)
Platelets: 124 10*3/uL — ABNORMAL LOW (ref 150–400)
RBC: 2.7 MIL/uL — ABNORMAL LOW (ref 4.22–5.81)
RDW: 14.9 % (ref 11.5–15.5)
WBC: 7.6 10*3/uL (ref 4.0–10.5)
nRBC: 0 % (ref 0.0–0.2)

## 2023-12-10 LAB — GLUCOSE, CAPILLARY
Glucose-Capillary: 119 mg/dL — ABNORMAL HIGH (ref 70–99)
Glucose-Capillary: 129 mg/dL — ABNORMAL HIGH (ref 70–99)
Glucose-Capillary: 175 mg/dL — ABNORMAL HIGH (ref 70–99)
Glucose-Capillary: 177 mg/dL — ABNORMAL HIGH (ref 70–99)
Glucose-Capillary: 208 mg/dL — ABNORMAL HIGH (ref 70–99)
Glucose-Capillary: 231 mg/dL — ABNORMAL HIGH (ref 70–99)

## 2023-12-10 LAB — VITAMIN B12: Vitamin B-12: 296 pg/mL (ref 180–914)

## 2023-12-10 LAB — PHOSPHORUS: Phosphorus: 3.1 mg/dL (ref 2.5–4.6)

## 2023-12-10 LAB — VITAMIN D 25 HYDROXY (VIT D DEFICIENCY, FRACTURES): Vit D, 25-Hydroxy: 24.44 ng/mL — ABNORMAL LOW (ref 30–100)

## 2023-12-10 LAB — CALCIUM, IONIZED: Calcium, Ionized, Serum: 4.7 mg/dL (ref 4.5–5.6)

## 2023-12-10 LAB — IRON AND TIBC
Iron: 17 ug/dL — ABNORMAL LOW (ref 45–182)
Saturation Ratios: 11 % — ABNORMAL LOW (ref 17.9–39.5)
TIBC: 160 ug/dL — ABNORMAL LOW (ref 250–450)
UIBC: 143 ug/dL

## 2023-12-10 LAB — FOLATE: Folate: 8.8 ng/mL (ref >5.9)

## 2023-12-10 LAB — MAGNESIUM: Magnesium: 2 mg/dL (ref 1.7–2.4)

## 2023-12-10 MED ORDER — POTASSIUM CHLORIDE CRYS ER 20 MEQ PO TBCR
40.0000 meq | EXTENDED_RELEASE_TABLET | Freq: Once | ORAL | Status: AC
  Administered 2023-12-10: 40 meq via ORAL
  Filled 2023-12-10: qty 2

## 2023-12-10 MED ORDER — SODIUM CHLORIDE 0.9 % IV SOLN
1.0000 g | INTRAVENOUS | Status: DC
  Administered 2023-12-10 – 2023-12-11 (×2): 1 g via INTRAVENOUS
  Filled 2023-12-10 (×3): qty 10

## 2023-12-10 NOTE — Inpatient Diabetes Management (Signed)
Inpatient Diabetes Program Recommendations  AACE/ADA: New Consensus Statement on Inpatient Glycemic Control (2015)  Target Ranges:  Prepandial:   less than 140 mg/dL      [facility] postprandial:   less than 180 mg/dL (1-2 hours)      Critically ill patients:  140 - 180 mg/dL    Latest Reference Range & Units 12/08/23 15:45 12/08/23 16:04 12/08/23 19:37 12/08/23 22:04 12/08/23 23:21 12/08/23 23:42 12/09/23 00:35 12/09/23 02:19 12/09/23 02:51 12/09/23 05:49  Glucose-Capillary 70 - 99 mg/dL 47 (L) 93 61 (L) 74 48 (L) 98 46 (L) 34 (LL) 88 96  (LL): Data is critically low (L): Data is abnormally low  Latest Reference Range & Units 12/09/23 06:35 12/09/23 07:57 12/09/23 09:10 12/09/23 09:55 12/09/23 11:05 12/09/23 11:58 12/09/23 14:27 12/09/23 15:24 12/09/23 17:44 12/09/23 19:31  Glucose-Capillary 70 - 99 mg/dL 95 98 782 (H) 956 (H) 213 (H) 239 (H) 311 (H) 329 (H) 303 (H)  11 units Novolog  342 (H)  (H): Data is abnormally high  Latest Reference Range & Units 12/09/23 21:19 12/09/23 21:21 12/09/23 23:50 12/10/23 03:52  Glucose-Capillary 70 - 99 mg/dL 086 (HH) 578 (H) 469 (H)  9 units Novolog  177 (H)  6 units Novolog   (HH): Data is critically high (H): Data is abnormally high   Admit with:  Severe sepsis secondary to UTI  Hypoglycemia  History: DM2  Home DM Meds: Farxiga 10 mg daily       Metformin 500 mg BID  Current Orders: Novolog 3-6-9 units Q4 hours    MD- Note pt admitted with Hypoglycemia.  Steroids started yest and pt was getting D10% IVF.  CBGs rose >300.  Steroids stopped and D10% IVF stopped.  Novolog SSi added last PM.  Please consider:  1. Change Novolog to the 0-15 unit scale (Moderate strength) Q4 hours per the (Glycemic Control Order set)  2. May consider stopping Farxiga from home meds as pt admitted with UTI    --Will follow patient during hospitalization--  Ambrose Finland RN, MSN, CDCES Diabetes Coordinator Inpatient Glycemic Control  Team Team Pager: [phone] (8a-5p)

## 2023-12-10 NOTE — Consult Note (Signed)
PHARMACY CONSULT NOTE - ELECTROLYTES  Pharmacy Consult for Electrolyte Monitoring and Replacement   Recent Labs: Height: 6\' 4"  (193 cm) Weight: 101.4 kg (223 lb 8.7 oz) IBW/kg (Calculated) : 86.8 Estimated Creatinine Clearance: 85.9 mL/min (by C-G formula based on SCr of 0.94 mg/dL). Potassium (mmol/L)  Date Value  12/10/2023 3.7   Magnesium (mg/dL)  Date Value  96/01/5408 2.0   Calcium (mg/dL)  Date Value  81/19/1478 7.9 (L)   Albumin (g/dL)  Date Value  29/56/2130 2.6 (L)   Phosphorus (mg/dL)  Date Value  86/57/8469 3.1   Sodium (mmol/L)  Date Value  12/10/2023 138   Corrected Ca: 8.44 mg/dL  Assessment  Jason [name] is a 73 y.o. male presenting with unresponsiveness and hypoglycemia. PMH significant for HTN, HLD, T2DM, CAD, [name], AICD, Atrial fibrillation(on apixaban PTA), spinal stenosis. Pharmacy has been consulted to monitor and replace electrolytes.  Diet: regular MIVF: NA Pertinent medications: n/a  Goal of Therapy: Electrolytes WNL Corrected Calcium 9.0  Plan:  Replace with 40 mEq KCl PO x 1 due history of heart disease   No other replacement indicated at this time Check BMP, Mg, Phos with AM labs  Thank you for allowing pharmacy to be a part of this patient's care.  Effie Shy, PharmD Pharmacy Resident  12/10/2023 8:48 AM

## 2023-12-10 NOTE — Progress Notes (Signed)
Physical Therapy Treatment Patient Details Name: Jason [name] MRN: [id number] DOB: [birth year] Today's Date: 12/10/2023   History of Present Illness Jason [name] is a 73 year old with a PMH significant for HTN, HLD, CAD/MI s/p [name], AICD, Diastolic Heart Failure, Afib on Eliquis, Diabetes Type 2, and Spinal Stenosis s/p OR 10/18/2024 (was recently discharged from [facility]) that presented to the ER with unresponsiveness and hypoglycemia.    PT Comments  Patient received in bed, he is agreeable and ready to get out of bed. Patient is mod I for bed mobility. Increased time needed due to B knee pain. He requires cues for getting squarely seated on edge of bed. Patient is able to stand with cga from elevated bed. He ambulated 90 feet with RW and cga. No lob, limited by LE weakness. Patient will continue to benefit from skilled PT to improve functional independence, strength and endurance.       If plan is discharge home, recommend the following: A little help with walking and/or transfers;A little help with bathing/dressing/bathroom;Assist for transportation;Help with stairs or ramp for entrance   Can travel by private vehicle      If high yes  Equipment Recommendations  None recommended by PT    Recommendations for Other Services       Precautions / Restrictions Precautions Recall of Precautions/Restrictions: Intact Precaution/Restrictions Comments: mod fall Restrictions Weight Bearing Restrictions Per Provider Order: No     Mobility  Bed Mobility Overal bed mobility: Modified Independent Bed Mobility: Supine to Sit     Supine to sit: Modified independent (Device/Increase time)          Transfers Overall transfer level: Modified independent Equipment used: Rolling walker (2 wheels) Transfers: Sit to/from Stand Sit to Stand: Supervision                Ambulation/Gait Ambulation/Gait assistance: Contact guard assist Gait Distance (Feet): 90 Feet Assistive device:  Rolling walker (2 wheels) Gait Pattern/deviations: Step-through pattern, Decreased step length - right, Decreased step length - left Gait velocity: decr     General Gait Details: Patient able to ambulate 90 feet with RW, HR up to 140s. No lob, limited by weakness.   Stairs             Wheelchair Mobility     Tilt Bed    Modified Rankin (Stroke Patients Only)       Balance Overall balance assessment: Needs assistance Sitting-balance support: Feet supported Sitting balance-Leahy Scale: Good     Standing balance support: Bilateral upper extremity supported, During functional activity, Reliant on assistive device for balance Standing balance-Leahy Scale: Good                              Communication Communication Communication: No apparent difficulties  Cognition Arousal: Alert Behavior During Therapy: WFL for tasks assessed/performed   PT - Cognitive impairments: No apparent impairments                         Following commands: Intact      Cueing Cueing Techniques: Verbal cues  Exercises      General Comments        Pertinent Vitals/Pain Pain Assessment Pain Assessment: Faces Pain Location: B knees, Chronic Pain Descriptors / Indicators: Discomfort, Sore Pain Intervention(s): Monitored during session, Repositioned    Home Living  Prior Function            PT Goals (current goals can now be found in the care plan section) Acute Rehab PT Goals Patient Stated Goal: get moving better and go home PT Goal Formulation: With patient Time For Goal Achievement: 12/23/23 Potential to Achieve Goals: Good Progress towards PT goals: Progressing toward goals    Frequency    Min 1X/week      PT Plan      Co-evaluation              AM-PAC PT "6 Clicks" Mobility   Outcome Measure  Help needed turning from your back to your side while in a flat bed without using bedrails?: A  Little Help needed moving from lying on your back to sitting on the side of a flat bed without using bedrails?: A Little Help needed moving to and from a bed to a chair (including a wheelchair)?: A Little Help needed standing up from a chair using your arms (e.g., wheelchair or bedside chair)?: A Little Help needed to walk in hospital room?: A Little Help needed climbing 3-5 steps with a railing? : A Lot 6 Click Score: 17    End of Session Equipment Utilized During Treatment: Gait belt Activity Tolerance: Patient tolerated treatment well;Patient limited by fatigue Patient left: in chair;with call bell/phone within reach Nurse Communication: Mobility status PT Visit Diagnosis: Muscle weakness (generalized) (M62.81);Difficulty in walking, not elsewhere classified (R26.2)     Time: 4196-2229 PT Time Calculation (min) (ACUTE ONLY): 18 min  Charges:    $Gait Training: 8-22 mins PT General Charges $$ ACUTE PT VISIT: 1 Visit                     Reily Ilic, PT, GCS 12/10/23,10:17 AM

## 2023-12-10 NOTE — Plan of Care (Signed)
  Problem: Health Behavior/Discharge Planning: Goal: Ability to manage health-related needs will improve Outcome: Progressing   Problem: Clinical Measurements: Goal: Ability to maintain clinical measurements within normal limits will improve Outcome: Progressing Goal: Will remain free from infection Outcome: Progressing Goal: Diagnostic test results will improve Outcome: Progressing Goal: Respiratory complications will improve Outcome: Progressing Goal: Cardiovascular complication will be avoided Outcome: Progressing   Problem: Activity: Goal: Risk for activity intolerance will decrease Outcome: Progressing   Problem: Nutrition: Goal: Adequate nutrition will be maintained Outcome: Progressing   Problem: Coping: Goal: Level of anxiety will decrease Outcome: Progressing   Problem: Pain Managment: Goal: General experience of comfort will improve and/or be controlled Outcome: Progressing   Problem: Safety: Goal: Ability to remain free from injury will improve Outcome: Progressing   Problem: Education: Goal: Ability to describe self-care measures that may prevent or decrease complications (Diabetes Survival Skills Education) will improve Outcome: Progressing   Problem: Fluid Volume: Goal: Ability to maintain a balanced intake and output will improve Outcome: Progressing   Problem: Nutritional: Goal: Maintenance of adequate nutrition will improve Outcome: Progressing   Problem: Skin Integrity: Goal: Risk for impaired skin integrity will decrease Outcome: Progressing   Problem: Tissue Perfusion: Goal: Adequacy of tissue perfusion will improve Outcome: Progressing

## 2023-12-10 NOTE — Progress Notes (Signed)
[facility] Progress Note  Patient: Jason [name]    AOZ:[id number]  DOA: 12/08/2023     Date of Service: the patient was seen and examined on 12/10/2023  Chief Complaint  Patient presents with   Hypoglycemia   Brief hospital course: Jason [name] is a 73 y.o. male with medical history significant of hypertension, hyperlipidemia, diabetes mellitus, CAD, [name], AICD, diastolic CHF, hypothyroidism, A-fib on Eliquis, spinal stenosis, who presents with unresponsiveness and hypoglycemia.   Per report, pt was found to be unresponsive at home.  He was normal last night.  Per EMS report, patient had CBG of 33. Pt was given 1 bag of D10 in 250 mL.  His mental status is back to baseline at arrival to ED.  Patient feels cold, denies fall or any injury.  No chest pain, cough, SOB.  No nausea, vomiting, diarrhea or abdominal pain.  He reports burning with urination, denies dysuria.  No hematuria.   Data reviewed independently and ED Course: pt was found to have WBC 11.7, BNP 343.6,  positive urinalysis (cloudy appearance, large amount of leukocyte, few bacteria, WBC> 50), negative PCR for COVID, flu and RSV, potassium 3.3, magnesium 1.2, phosphorus 3.2, GFR> 60, temperature normal, blood pressure 124/62, heart rate of 110, RR 24, oxygen saturation 93%-95% on room air.  Patient is placed to PCU for observation.     EKG: I have personally reviewed.  A-fib, poor R wave progression, with artificial effects     Assessment and Plan:  Severe sepsis secondary to UTI fever Tmax 102.9, HR 120, RR 24, lactic acid 2.3, Bp dropped 65/57  S/p IV fluid bolus given in the ED. lactic acid trended down S/p  Solu-Cortef 100 mg IV daily S/p midodrine 10 mg p.o. 3 times daily S/p albumin 25 g Blood pressure did not improve, patient was transferred to stepdown unit S/p Levophed DC'd on 2/14 Continue to monitor on telemetry, Monitor BP and titrate Levophed accordingly   UTI (urinary tract infection) S/p IV  Rocephin S/p cefepime and vancomycin, DC'd on 2/14 blood cultures NGTD urine culture growing Klebsiella pneumonia, sensitive to ceftriaxone 2/14, started ceftriaxone 1 g IV daily for 5 days   Hypoglycemia: Blood sugar down to 33, patient was given D50 and started on D10, still persistently hypoglycemic.  Likely due to poor oral intake and ongoing infection with UTI.  Mental status is back to baseline.  -Hold home diabetic medications -CBG every hour -D50 as needed -s/p D10 at 100 cc/h, d/c'd on 2/13 due to hyperglycemia   # Diabetes mellitus without complication: Recent A1c 5.8, well-controlled.  Patient is taking [place] and metformin at home. -Hold home medications due to hypoglycemia 2/13 hypoglycemia resolved, BG elevated, started NovoLog sliding scale Monitor CBG   # Hypokalemia, resolved # Hypophosphatemia due to nutritional deficiency, Phos repleted. # Hypomagnesemia, mag repleted. Monitor electrolytes and replete as needed.   # A-fib, CAD stable [name], diastolic CHF, status post AICD Resumed Eliquis 5 mg p.o. twice daily, amiodarone 100 mg p.o. daily Crestor 40 mg p.o. daily BNP 343 No signs of volume overload  # Hypothyroid, continue Synthroid 50 mcg p.o. daily  # Unresponsiveness most likely secondary to hypoglycemia Currently patient is back to baseline CT head negative for any acute findings   Body mass index is 27.21 kg/m.  Interventions:  Pressure Injury 10/27/23 Buttocks Right;Left small open red areas (Active)  10/27/23 0800  Location: Buttocks  Location Orientation: Right;Left  Staging:   Wound Description (Comments): small open  red areas  Present on Admission: No     Pressure Injury 12/09/23 Buttocks Right;Left Stage 1 -  Intact skin with non-blanchable redness of a localized area usually over a bony prominence. red and  blanchable /pictures was taken (Active)  12/09/23 0645  Location: Buttocks  Location Orientation: Right;Left  Staging: Stage 1  -  Intact skin with non-blanchable redness of a localized area usually over a bony prominence.  Wound Description (Comments): red and  blanchable /pictures was taken  Present on Admission: Yes  Dressing Type Foam - Lift dressing to assess site every shift 12/10/23 0745     Diet: Carb modified diet DVT Prophylaxis: Therapeutic Anticoagulation with Eliquis    Advance goals of care discussion: Full code  Family Communication: family was not present at bedside, at the time of interview.  The pt provided permission to discuss medical plan with the family. Opportunity was given to ask question and all questions were answered satisfactorily.   Disposition:  Pt is from Home, admitted with sepsis, UTI, still on IV abx, which precludes a safe discharge. Discharge to Home, when stable, may need 2 to 3 days to improve.  Subjective: No significant events overnight, patient was feeling some dysuria but not very significant.  Patient was sitting comfortably on the recliner, no any complaints, no shortness of breath, no chest pain or palpitation.    Physical Exam: General: NAD, lying comfortably Appear in no distress, affect appropriate Eyes: PERRLA ENT: Oral Mucosa Clear, moist  Neck: no JVD,  Cardiovascular: S1 and S2 Present, no Murmur,  Respiratory: good respiratory effort, Bilateral Air entry equal and Decreased, no Crackles, no wheezes Abdomen: Bowel Sound present, Soft and no tenderness,  Skin: no rashes Extremities: no Pedal edema, no calf tenderness Neurologic: without any new focal findings Gait not checked due to patient safety concerns  Vitals:   12/10/23 1400 12/10/23 1500 12/10/23 1518 12/10/23 1600  BP: 114/60 110/62  (!) 103/44  Pulse: (!) 104 (!) 54  94  Resp: (!) 27 19  16   Temp:   99.1 F (37.3 C)   TempSrc:   Oral   SpO2: 96% 97%  100%  Weight:      Height:        Intake/Output Summary (Last 24 hours) at 12/10/2023 1638 Last data filed at 12/10/2023 1629 Gross per  24 hour  Intake 1761.08 ml  Output 1650 ml  Net 111.08 ml   Filed Weights   12/08/23 1538 12/10/23 0500  Weight: 106.6 kg 101.4 kg    Data Reviewed: I have personally reviewed and interpreted daily labs, tele strips, imagings as discussed above. I reviewed all nursing notes, pharmacy notes, vitals, pertinent old records I have discussed plan of care as described above with RN and patient/family.  CBC: Recent Labs  Lab 12/08/23 1559 12/09/23 0402 12/10/23 0433  WBC 11.7* 8.8 7.6  NEUTROABS 10.3*  --   --   HGB 10.5* 8.0* 7.8*  HCT 31.9* 25.3* 24.7*  MCV 91.4 92.3 91.5  PLT 193 108* 124*   Basic Metabolic Panel: Recent Labs  Lab 12/08/23 1559 12/09/23 0402 12/09/23 1108 12/09/23 1109 12/09/23 1720 12/09/23 2139 12/10/23 0433  NA 136 134*  --  136 137 137 138  K 3.3* 3.5  --  3.8 4.0 4.1 3.7  CL 99 104  --  107 103 105 109  CO2 25 22  --  23 21* 20* 24  GLUCOSE 146* 69*  --  181* 326* 345* 173*  BUN 19 16  --  14 16 17 17   CREATININE 1.07 0.95  --  0.99 1.06 1.01 0.94  CALCIUM 7.9* 7.0*  --  7.6* 7.6* 7.9* 7.9*  MG 1.2* 1.3* 1.8  --   --   --  2.0  PHOS 3.2 2.1*  --   --   --   --  3.1    Studies: No results found.   Scheduled Meds:  amiodarone  100 mg Oral Daily   apixaban  5 mg Oral BID   Chlorhexidine Gluconate Cloth  6 each Topical Daily   insulin aspart  3-9 Units Subcutaneous Q4H   levothyroxine  50 mcg Oral QAC breakfast   melatonin  5 mg Oral QHS   pantoprazole  40 mg Oral Daily   rosuvastatin  40 mg Oral QHS   Continuous Infusions:  cefTRIAXone (ROCEPHIN)  IV 200 mL/hr at 12/10/23 1519   PRN Meds: acetaminophen, docusate sodium, gabapentin, ondansetron (ZOFRAN) IV, mouth rinse, senna-docusate  Time spent: 40 minutes  Author: Gillis Santa. MD [facility] 12/10/2023 4:38 PM  To reach On-call, see care teams to locate the attending and reach out to them via [email or web address]. If 7PM-7AM, please contact night-coverage If you still have  difficulty reaching the attending provider, please page the [facility] (Director on Call) for [facility] on amion for assistance.

## 2023-12-10 NOTE — Plan of Care (Signed)

## 2023-12-11 DIAGNOSIS — E162 Hypoglycemia, unspecified: Secondary | ICD-10-CM | POA: Diagnosis not present

## 2023-12-11 LAB — GLUCOSE, CAPILLARY
Glucose-Capillary: 105 mg/dL — ABNORMAL HIGH (ref 70–99)
Glucose-Capillary: 114 mg/dL — ABNORMAL HIGH (ref 70–99)
Glucose-Capillary: 156 mg/dL — ABNORMAL HIGH (ref 70–99)
Glucose-Capillary: 156 mg/dL — ABNORMAL HIGH (ref 70–99)
Glucose-Capillary: 125 mg/dL — ABNORMAL HIGH (ref 70–99)

## 2023-12-11 LAB — CBC
HCT: 27.4 % — ABNORMAL LOW (ref 39.0–52.0)
Hemoglobin: 8.7 g/dL — ABNORMAL LOW (ref 13.0–17.0)
MCH: 29.6 pg (ref 26.0–34.0)
MCHC: 31.8 g/dL (ref 30.0–36.0)
MCV: 93.2 fL (ref 80.0–100.0)
Platelets: 144 10*3/uL — ABNORMAL LOW (ref 150–400)
RBC: 2.94 MIL/uL — ABNORMAL LOW (ref 4.22–5.81)
RDW: 14.8 % (ref 11.5–15.5)
WBC: 7.4 10*3/uL (ref 4.0–10.5)
nRBC: 0 % (ref 0.0–0.2)

## 2023-12-11 LAB — BASIC METABOLIC PANEL
Anion gap: 8 (ref 5–15)
BUN: 14 mg/dL (ref 8–23)
CO2: 21 mmol/L — ABNORMAL LOW (ref 22–32)
Calcium: 8.3 mg/dL — ABNORMAL LOW (ref 8.9–10.3)
Chloride: 107 mmol/L (ref 98–111)
Creatinine, Ser: 0.94 mg/dL (ref 0.61–1.24)
GFR, Estimated: >60 mL/min (ref >60)
Glucose, Bld: 113 mg/dL — ABNORMAL HIGH (ref 70–99)
Potassium: 4 mmol/L (ref 3.5–5.1)
Sodium: 136 mmol/L (ref 135–145)

## 2023-12-11 LAB — MAGNESIUM: Magnesium: 1.9 mg/dL (ref 1.7–2.4)

## 2023-12-11 LAB — PHOSPHORUS: Phosphorus: 1.9 mg/dL — ABNORMAL LOW (ref 2.5–4.6)

## 2023-12-11 MED ORDER — BISACODYL 10 MG RE SUPP: 10.0000 mg | Freq: Every day | RECTAL | Status: DC | PRN

## 2023-12-11 MED ORDER — INSULIN ASPART 100 UNIT/ML IJ SOLN
0.0000 [IU] | Freq: Three times a day (TID) | INTRAMUSCULAR | Status: DC
  Administered 2023-12-11 – 2023-12-12 (×3): 3 [IU] via SUBCUTANEOUS
  Filled 2023-12-11 (×3): qty 1

## 2023-12-11 MED ORDER — POLYETHYLENE GLYCOL 3350 17 G PO PACK
17.0000 g | PACK | Freq: Two times a day (BID) | ORAL | Status: DC
  Administered 2023-12-11 – 2023-12-12 (×3): 17 g via ORAL
  Filled 2023-12-11 (×3): qty 1

## 2023-12-11 MED ORDER — INSULIN ASPART 100 UNIT/ML IJ SOLN: 0.0000 [IU] | Freq: Three times a day (TID) | INTRAMUSCULAR | Status: DC

## 2023-12-11 MED ORDER — POTASSIUM PHOSPHATES 15 MMOLE/5ML IV SOLN
30.0000 mmol | Freq: Once | INTRAVENOUS | Status: AC
  Administered 2023-12-11: 30 mmol via INTRAVENOUS
  Filled 2023-12-11: qty 10

## 2023-12-11 MED ORDER — BISACODYL 5 MG PO TBEC
10.0000 mg | DELAYED_RELEASE_TABLET | Freq: Once | ORAL | Status: AC
  Administered 2023-12-11: 10 mg via ORAL
  Filled 2023-12-11: qty 2

## 2023-12-11 MED ORDER — METOPROLOL TARTRATE 25 MG PO TABS
12.5000 mg | ORAL_TABLET | Freq: Two times a day (BID) | ORAL | Status: DC
  Administered 2023-12-11 (×2): 12.5 mg via ORAL
  Filled 2023-12-11 (×3): qty 1

## 2023-12-11 MED ORDER — BISACODYL 5 MG PO TBEC: 10.0000 mg | DELAYED_RELEASE_TABLET | Freq: Every day | ORAL | Status: DC

## 2023-12-11 MED ORDER — FLUTICASONE PROPIONATE 50 MCG/ACT NA SUSP
2.0000 | Freq: Every day | NASAL | Status: DC
  Administered 2023-12-11 – 2023-12-12 (×2): 2 via NASAL
  Filled 2023-12-11: qty 16

## 2023-12-11 NOTE — Plan of Care (Signed)
  Problem: Health Behavior/Discharge Planning: Goal: Ability to manage health-related needs will improve Outcome: Progressing   Problem: Clinical Measurements: Goal: Ability to maintain clinical measurements within normal limits will improve Outcome: Progressing Goal: Will remain free from infection Outcome: Progressing Goal: Diagnostic test results will improve Outcome: Progressing Goal: Respiratory complications will improve Outcome: Progressing   Problem: Activity: Goal: Risk for activity intolerance will decrease Outcome: Progressing   Problem: Nutrition: Goal: Adequate nutrition will be maintained Outcome: Progressing   Problem: Elimination: Goal: Will not experience complications related to bowel motility Outcome: Progressing Goal: Will not experience complications related to urinary retention Outcome: Progressing   Problem: Safety: Goal: Ability to remain free from injury will improve Outcome: Progressing   Problem: Skin Integrity: Goal: Risk for impaired skin integrity will decrease Outcome: Progressing   Problem: Coping: Goal: Ability to adjust to condition or change in health will improve Outcome: Progressing   Problem: Metabolic: Goal: Ability to maintain appropriate glucose levels will improve Outcome: Progressing   Problem: Nutritional: Goal: Progress toward achieving an optimal weight will improve Outcome: Progressing   Problem: Skin Integrity: Goal: Risk for impaired skin integrity will decrease Outcome: Progressing

## 2023-12-11 NOTE — Progress Notes (Signed)
Report called to [facility], care nurse for room 249.

## 2023-12-11 NOTE — Progress Notes (Signed)
Pt transported to room 249 on telemetry accompanied by this RN and NT, Thayer Ohm. Final hand-off completed at bedside.

## 2023-12-11 NOTE — Consult Note (Signed)
PHARMACY CONSULT NOTE - ELECTROLYTES  Pharmacy Consult for Electrolyte Monitoring and Replacement   Recent Labs: Height: 6\' 4"  (193 cm) Weight: 98.5 kg (217 lb 2.5 oz) IBW/kg (Calculated) : 86.8 Estimated Creatinine Clearance: 85.9 mL/min (by C-G formula based on SCr of 0.94 mg/dL). Potassium (mmol/L)  Date Value  12/11/2023 4.0   Magnesium (mg/dL)  Date Value  09/60/4540 1.9   Calcium (mg/dL)  Date Value  98/08/9146 8.3 (L)   Albumin (g/dL)  Date Value  82/95/6213 2.6 (L)   Phosphorus (mg/dL)  Date Value  08/65/7846 1.9 (L)   Sodium (mmol/L)  Date Value  12/11/2023 136    Assessment  Jason [name] is a 74 y.o. male presenting with unresponsiveness and hypoglycemia. PMH significant for HTN, HLD, T2DM, CAD, [name], AICD, Atrial fibrillation(on apixaban PTA), spinal stenosis. Pharmacy has been consulted to monitor and replace electrolytes.  MIVF: NA Pertinent medications: amio,   Goal of Therapy: Electrolytes WNL   Plan:  Medical team ordered Kphos 30 mmol IV x 1.  F/u with AM labs.   Thank you for allowing pharmacy to be a part of this patient's care.  Ronnald Ramp, PharmD 12/11/2023 12:11 PM

## 2023-12-11 NOTE — Progress Notes (Signed)
[facility] Progress Note  Patient: Jason [name]    LKG:[id number]  DOA: 12/08/2023     Date of Service: the patient was seen and examined on 12/11/2023  Chief Complaint  Patient presents with   Hypoglycemia   Brief hospital course: Jason [name] is a 73 y.o. male with medical history significant of hypertension, hyperlipidemia, diabetes mellitus, CAD, [name], AICD, diastolic CHF, hypothyroidism, A-fib on Eliquis, spinal stenosis, who presents with unresponsiveness and hypoglycemia.   Per report, pt was found to be unresponsive at home.  He was normal last night.  Per EMS report, patient had CBG of 33. Pt was given 1 bag of D10 in 250 mL.  His mental status is back to baseline at arrival to ED.  Patient feels cold, denies fall or any injury.  No chest pain, cough, SOB.  No nausea, vomiting, diarrhea or abdominal pain.  He reports burning with urination, denies dysuria.  No hematuria.   Data reviewed independently and ED Course: pt was found to have WBC 11.7, BNP 343.6,  positive urinalysis (cloudy appearance, large amount of leukocyte, few bacteria, WBC> 50), negative PCR for COVID, flu and RSV, potassium 3.3, magnesium 1.2, phosphorus 3.2, GFR> 60, temperature normal, blood pressure 124/62, heart rate of 110, RR 24, oxygen saturation 93%-95% on room air.  Patient is placed to PCU for observation.     EKG: I have personally reviewed.  A-fib, poor R wave progression, with artificial effects     Assessment and Plan:  Severe sepsis secondary to UTI fever Tmax 102.9, HR 120, RR 24, lactic acid 2.3, Bp dropped 65/57  S/p IV fluid bolus given in the ED. lactic acid trended down S/p  Solu-Cortef 100 mg IV daily S/p midodrine 10 mg p.o. 3 times daily S/p albumin 25 g Blood pressure did not improve, patient was transferred to stepdown unit S/p Levophed DC'd on 2/14 BP stable now, continue to monitor. Continue to monitor on telemetry,   UTI (urinary tract infection) S/p IV  Rocephin S/p cefepime and vancomycin, DC'd on 2/14 blood cultures NGTD urine culture growing Klebsiella pneumonia, sensitive to ceftriaxone 2/14, started ceftriaxone 1 g IV daily for 5 days   Hypoglycemia: Blood sugar down to 33, patient was given D50 and started on D10, still persistently hypoglycemic.  Likely due to poor oral intake and ongoing infection with UTI.  Mental status is back to baseline.  -Hold home diabetic medications -CBG every hour -D50 as needed -s/p D10 at 100 cc/h, d/c'd on 2/13 due to hyperglycemia   # Diabetes mellitus without complication: Recent A1c 5.8, well-controlled.  Patient is taking [place] and metformin at home. -Hold home medications due to hypoglycemia 2/13 hypoglycemia resolved, BG elevated, started NovoLog sliding scale Monitor CBG   # Hypokalemia, resolved # Hypophosphatemia due to nutritional deficiency, Phos repleted. # Hypomagnesemia, mag repleted. Monitor electrolytes and replete as needed.   # A-fib, CAD stable [name], diastolic CHF, status post AICD Resumed Eliquis 5 mg p.o. twice daily, amiodarone 100 mg p.o. daily Crestor 40 mg p.o. daily BNP 343 No signs of volume overload 2/15 BP improved, patient remained in A-fib with RVR, started low-dose metoprolol 12.5 mg p.o. twice daily with holding parameters   # Hypothyroid, continue Synthroid 50 mcg p.o. daily  # Unresponsiveness most likely secondary to hypoglycemia Currently patient is back to baseline CT head negative for any acute findings  # Constipation, started laxatives.   Body mass index is 26.43 kg/m.  Interventions:  Pressure Injury 10/27/23  Buttocks Right;Left small open red areas (Active)  10/27/23 0800  Location: Buttocks  Location Orientation: Right;Left  Staging:   Wound Description (Comments): small open red areas  Present on Admission: No     Pressure Injury 12/09/23 Buttocks Right;Left Stage 1 -  Intact skin with non-blanchable redness of a localized area  usually over a bony prominence. red and  blanchable /pictures was taken (Active)  12/09/23 0645  Location: Buttocks  Location Orientation: Right;Left  Staging: Stage 1 -  Intact skin with non-blanchable redness of a localized area usually over a bony prominence.  Wound Description (Comments): red and  blanchable /pictures was taken  Present on Admission: Yes  Dressing Type Foam - Lift dressing to assess site every shift 12/11/23 0400     Diet: Carb modified diet DVT Prophylaxis: Therapeutic Anticoagulation with Eliquis    Advance goals of care discussion: Full code  Family Communication: family was not present at bedside, at the time of interview.  The pt provided permission to discuss medical plan with the family. Opportunity was given to ask question and all questions were answered satisfactorily.   Disposition:  Pt is from Home, admitted with sepsis, UTI, still on IV abx, which precludes a safe discharge. Discharge to Home, when stable, may need 2 to 3 days to improve.  Subjective: No significant events overnight, patient was resting comfortably in the bed.  Stated that he is having constipation, no BM for past 2 to 3 days and having some nasal congestion. Denied any other complaints.     Physical Exam: General: NAD, lying comfortably Appear in no distress, affect appropriate Eyes: PERRLA ENT: Oral Mucosa Clear, moist  Neck: no JVD,  Cardiovascular: S1 and S2 Present, no Murmur,  Respiratory: good respiratory effort, Bilateral Air entry equal and Decreased, no Crackles, no wheezes Abdomen: Bowel Sound present, Soft and no tenderness,  Skin: no rashes Extremities: no Pedal edema, no calf tenderness Neurologic: without any new focal findings Gait not checked due to patient safety concerns  Vitals:   12/11/23 0900 12/11/23 1000 12/11/23 1200 12/11/23 1236  BP:  122/66 121/65   Pulse:  (!) 101 88 94  Resp:  (!) 21 (!) 27 16  Temp: 98.5 F (36.9 C)   98.2 F (36.8 C)   TempSrc: Oral   Oral  SpO2:  96% 94% 99%  Weight:      Height:        Intake/Output Summary (Last 24 hours) at 12/11/2023 1442 Last data filed at 12/11/2023 1200 Gross per 24 hour  Intake 1485.58 ml  Output 1475 ml  Net 10.58 ml   Filed Weights   12/08/23 1538 12/10/23 0500 12/11/23 0500  Weight: 106.6 kg 101.4 kg 98.5 kg    Data Reviewed: I have personally reviewed and interpreted daily labs, tele strips, imagings as discussed above. I reviewed all nursing notes, pharmacy notes, vitals, pertinent old records I have discussed plan of care as described above with RN and patient/family.  CBC: Recent Labs  Lab 12/08/23 1559 12/09/23 0402 12/10/23 0433 12/11/23 0710  WBC 11.7* 8.8 7.6 7.4  NEUTROABS 10.3*  --   --   --   HGB 10.5* 8.0* 7.8* 8.7*  HCT 31.9* 25.3* 24.7* 27.4*  MCV 91.4 92.3 91.5 93.2  PLT 193 108* 124* 144*   Basic Metabolic Panel: Recent Labs  Lab 12/08/23 1559 12/09/23 0402 12/09/23 1108 12/09/23 1109 12/09/23 1720 12/09/23 2139 12/10/23 0433 12/11/23 0710  NA 136 134*  --  136  137 137 138 136  K 3.3* 3.5  --  3.8 4.0 4.1 3.7 4.0  CL 99 104  --  107 103 105 109 107  CO2 25 22  --  23 21* 20* 24 21*  GLUCOSE 146* 69*  --  181* 326* 345* 173* 113*  BUN 19 16  --  14 16 17 17 14   CREATININE 1.07 0.95  --  0.99 1.06 1.01 0.94 0.94  CALCIUM 7.9* 7.0*  --  7.6* 7.6* 7.9* 7.9* 8.3*  MG 1.2* 1.3* 1.8  --   --   --  2.0 1.9  PHOS 3.2 2.1*  --   --   --   --  3.1 1.9*    Studies: No results found.   Scheduled Meds:  amiodarone  100 mg Oral Daily   apixaban  5 mg Oral BID   Chlorhexidine Gluconate Cloth  6 each Topical Daily   insulin aspart  0-15 Units Subcutaneous TID WC   levothyroxine  50 mcg Oral QAC breakfast   melatonin  5 mg Oral QHS   metoprolol tartrate  12.5 mg Oral BID   pantoprazole  40 mg Oral Daily   rosuvastatin  40 mg Oral QHS   Continuous Infusions:  cefTRIAXone (ROCEPHIN)  IV Stopped (12/10/23 1536)   potassium PHOSPHATE  IVPB (in mmol) 85 mL/hr at 12/11/23 1200   PRN Meds: acetaminophen, docusate sodium, gabapentin, ondansetron (ZOFRAN) IV, mouth rinse, senna-docusate  Time spent: 40 minutes  Author: Gillis Santa. MD [facility] 12/11/2023 2:42 PM  To reach On-call, see care teams to locate the attending and reach out to them via [email or web address]. If 7PM-7AM, please contact night-coverage If you still have difficulty reaching the attending provider, please page the [facility] (Director on Call) for [facility] on amion for assistance.

## 2023-12-12 DIAGNOSIS — E162 Hypoglycemia, unspecified: Secondary | ICD-10-CM | POA: Diagnosis not present

## 2023-12-12 LAB — BASIC METABOLIC PANEL
Anion gap: 4 — ABNORMAL LOW (ref 5–15)
BUN: 15 mg/dL (ref 8–23)
CO2: 27 mmol/L (ref 22–32)
Calcium: 8.5 mg/dL — ABNORMAL LOW (ref 8.9–10.3)
Chloride: 106 mmol/L (ref 98–111)
Creatinine, Ser: 0.94 mg/dL (ref 0.61–1.24)
GFR, Estimated: >60 mL/min (ref >60)
Glucose, Bld: 118 mg/dL — ABNORMAL HIGH (ref 70–99)
Potassium: 4.5 mmol/L (ref 3.5–5.1)
Sodium: 137 mmol/L (ref 135–145)

## 2023-12-12 LAB — CBC
HCT: 25.8 % — ABNORMAL LOW (ref 39.0–52.0)
Hemoglobin: 8.2 g/dL — ABNORMAL LOW (ref 13.0–17.0)
MCH: 29 pg (ref 26.0–34.0)
MCHC: 31.8 g/dL (ref 30.0–36.0)
MCV: 91.2 fL (ref 80.0–100.0)
Platelets: 131 10*3/uL — ABNORMAL LOW (ref 150–400)
RBC: 2.83 MIL/uL — ABNORMAL LOW (ref 4.22–5.81)
RDW: 14.8 % (ref 11.5–15.5)
WBC: 5.4 10*3/uL (ref 4.0–10.5)
nRBC: 0 % (ref 0.0–0.2)

## 2023-12-12 LAB — MAGNESIUM: Magnesium: 1.8 mg/dL (ref 1.7–2.4)

## 2023-12-12 LAB — GLUCOSE, CAPILLARY
Glucose-Capillary: 109 mg/dL — ABNORMAL HIGH (ref 70–99)
Glucose-Capillary: 174 mg/dL — ABNORMAL HIGH (ref 70–99)

## 2023-12-12 LAB — PHOSPHORUS: Phosphorus: 2.5 mg/dL (ref 2.5–4.6)

## 2023-12-12 MED ORDER — MAGNESIUM SULFATE 2 GM/50ML IV SOLN
2.0000 g | Freq: Once | INTRAVENOUS | Status: AC
  Administered 2023-12-12: 2 g via INTRAVENOUS
  Filled 2023-12-12: qty 50

## 2023-12-12 MED ORDER — METOPROLOL SUCCINATE ER 50 MG PO TB24: 50.0000 mg | ORAL_TABLET | Freq: Every morning | ORAL | 11 refills | Status: DC

## 2023-12-12 MED ORDER — METOPROLOL TARTRATE 25 MG PO TABS
25.0000 mg | ORAL_TABLET | Freq: Two times a day (BID) | ORAL | Status: DC
  Administered 2023-12-12: 25 mg via ORAL

## 2023-12-12 MED ORDER — CEFADROXIL 500 MG PO CAPS: 500.0000 mg | ORAL_CAPSULE | Freq: Two times a day (BID) | ORAL | 0 refills | Status: AC

## 2023-12-12 NOTE — Consult Note (Addendum)
PHARMACY CONSULT NOTE - ELECTROLYTES  Pharmacy Consult for Electrolyte Monitoring and Replacement   Recent Labs: Height: 6\' 4"  (193 cm) Weight: 98.7 kg (217 lb 9.5 oz) IBW/kg (Calculated) : 86.8 Estimated Creatinine Clearance: 85.9 mL/min (by C-G formula based on SCr of 0.94 mg/dL). Potassium (mmol/L)  Date Value  12/12/2023 4.5   Magnesium (mg/dL)  Date Value  09/81/1914 1.8   Calcium (mg/dL)  Date Value  78/29/5621 8.5 (L)   Albumin (g/dL)  Date Value  30/86/5784 2.6 (L)   Phosphorus (mg/dL)  Date Value  69/62/9528 2.5   Sodium (mmol/L)  Date Value  12/12/2023 137    Assessment  Jason [name] is a 73 y.o. male presenting with unresponsiveness and hypoglycemia. PMH significant for HTN, HLD, T2DM, CAD, [name], AICD, Atrial fibrillation(on apixaban PTA), spinal stenosis. Pharmacy has been consulted to monitor and replace electrolytes.  MIVF: NA Pertinent medications: amio,   Goal of Therapy: Electrolytes WNL (K+ > 4, Mg > 2)   Plan:  Mg 2 g IV x 1. Pt transferred to 2A. Pharmacy will sign off. Please re-consult if needed    Thank you for allowing pharmacy to be a part of this patient's care.  Ronnald Ramp, PharmD 12/12/2023 7:57 AM

## 2023-12-12 NOTE — TOC CM/SW Note (Signed)
Patient has orders to DC home. HH recs from therapy. Checked [facility], patient active with [facility]. Confirmed with [facility] Rep Heather. Informed Heather of DC today. They are open for RN, PT, OT - asked MD for [facility] [address] resumption orders.  Alfonso Ramus, LCSW Transitions of Care Department [phone]

## 2023-12-12 NOTE — Consult Note (Deleted)
PHARMACY CONSULT NOTE - ELECTROLYTES  Pharmacy Consult for Electrolyte Monitoring and Replacement   Recent Labs: Height: 6\' 4"  (193 cm) Weight: 98.7 kg (217 lb 9.5 oz) IBW/kg (Calculated) : 86.8 Estimated Creatinine Clearance: 85.9 mL/min (by C-G formula based on SCr of 0.94 mg/dL). Potassium (mmol/L)  Date Value  12/12/2023 4.5   Magnesium (mg/dL)  Date Value  40/98/1191 1.8   Calcium (mg/dL)  Date Value  47/82/9562 8.5 (L)   Albumin (g/dL)  Date Value  13/05/6577 2.6 (L)   Phosphorus (mg/dL)  Date Value  46/96/2952 2.5   Sodium (mmol/L)  Date Value  12/12/2023 137    Assessment  Jason [name] is a 73 y.o. male presenting with unresponsiveness and hypoglycemia. PMH significant for HTN, HLD, T2DM, CAD, [name], AICD, Atrial fibrillation(on apixaban PTA), spinal stenosis. Pharmacy has been consulted to monitor and replace electrolytes.  MIVF: NA Pertinent medications: amio,   Goal of Therapy: Electrolytes WNL   Plan:  No replacement needed. Pt transferred to 2A. Pharmacy will sign off. Please re-consult if needed.   Thank you for allowing pharmacy to be a part of this patient's care.  Ronnald Ramp, PharmD 12/12/2023 9:26 AM

## 2023-12-13 NOTE — Discharge Summary (Signed)
[facility] Discharge Summary   Patient: Jason [name] JXB:[id number]  PCP: Ailene Ravel, MD  Date of admission: 12/08/2023   Date of discharge: 12/12/2023 12/13/2023     Discharge Diagnoses:  Principal Problem:   Hypoglycemia Active Problems:   Severe sepsis (HCC)   UTI (urinary tract infection)   Unresponsiveness   CAD (coronary artery disease)   Chronic diastolic CHF (congestive heart failure) (HCC)   Essential hypertension   Paroxysmal atrial fibrillation (HCC)   Hypothyroidism   HLD (hyperlipidemia)   Hypomagnesemia   Hypokalemia   Overweight (BMI 25.0-29.9)   Diabetes mellitus without complication (HCC)   Septic shock (HCC)   Admitted From: Home Disposition:  Home with [facility]  Recommendations for Outpatient Follow-up:  Follow-up with PCP in 1 week Follow-up with cardiology in 1 week, due to low blood pressure decreased Toprol-XL from 100 to 50 mg.  Entresto and torsemide has been paused due to low blood pressure.  Follow with cardiology when to resume all medications. Follow up LABS/TEST:  As above   Follow-up Information     Hamrick, Maura L, MD Follow up in 1 week(s).   Specialty: Family Medicine Contact information: [address] [place] [address] [phone]         Cardiologist Follow up in 1 week(s).                 Diet recommendation: Cardiac and carb modified diet  Activity: The patient is advised to gradually reintroduce usual activities, as tolerated  Discharge Condition: stable  Code Status: Full code   History of present illness: As per the H and P dictated on admission Hospital Course:  Jason [name] is a 73 y.o. male with medical history significant of hypertension, hyperlipidemia, diabetes mellitus, CAD, [name], AICD, diastolic CHF, hypothyroidism, A-fib on Eliquis, spinal stenosis, who presents with unresponsiveness and hypoglycemia.   Per report, pt was found to be unresponsive at home.  He was normal last  night.  Per EMS report, patient had CBG of 33. Pt was given 1 bag of D10 in 250 mL.  His mental status is back to baseline at arrival to ED.  Patient feels cold, denies fall or any injury.  No chest pain, cough, SOB.  No nausea, vomiting, diarrhea or abdominal pain.  He reports burning with urination, denies dysuria.  No hematuria.   Data reviewed independently and ED Course: pt was found to have WBC 11.7, BNP 343.6,  positive urinalysis (cloudy appearance, large amount of leukocyte, few bacteria, WBC> 50), negative PCR for COVID, flu and RSV, potassium 3.3, magnesium 1.2, phosphorus 3.2, GFR> 60, temperature normal, blood pressure 124/62, heart rate of 110, RR 24, oxygen saturation 93%-95% on room air.  Patient is placed to PCU for observation.   EKG: I have personally reviewed.  A-fib, poor R wave progression, with artificial effects  Assessment and Plan:   # Severe sepsis secondary to UTI fever Tmax 102.9, HR 120, RR 24, lactic acid 2.3, Bp dropped 65/57  S/p IV fluid bolus given in the ED. lactic acid trended down S/p  Solu-Cortef 100 mg IV daily S/p midodrine 10 mg p.o. 3 times daily S/p albumin 25 g Blood pressure did not improve, patient was transferred to stepdown unit S/p Levophed DC'd on 2/14, BP stable now.  Sepsis resolved.    UTI (urinary tract infection) S/p IV Rocephin, S/p cefepime and vancomycin, DC'd on 2/14 blood cultures NGTD. urine culture growing Klebsiella pneumonia, sensitive to ceftriaxone 2/14,  started ceftriaxone 1 g IV daily.  Patient was discharged on cefadroxil 500 mg p.o. twice daily for 5 days   Hypoglycemia: Blood sugar down to 33, patient was given D50 and started on D10, still persistently hypoglycemic.  Likely due to poor oral intake and ongoing infection with UTI.  Mental status is back to baseline. Held home diabetic medications. S/p D50 prn and D10 IV fluid given.  Hypoglycemia resolved.  # Diabetes mellitus without complication: Recent A1c 5.8,  well-controlled.  Patient is taking [place] and metformin at home. Held home medications due to hypoglycemia 2/13 hypoglycemia resolved, BG elevated, s/p NovoLog sliding scale. Resumed home medications on discharge.  Patient was advised to monitor CBG and continue diabetic diet and follow with PCP.  # Hypokalemia, resolved # Hypophosphatemia due to nutritional deficiency, Phos repleted.  Resolved # Hypomagnesemia, mag repleted.  Resolved # A-fib, CAD stable [name], diastolic CHF, status post AICD Resumed Eliquis 5 mg p.o. twice daily, amiodarone 100 mg p.o. daily Crestor 40 mg p.o. daily. BNP 343, No signs of volume overload 2/15 BP improved, patient remained in A-fib with RVR, started low-dose metoprolol 12.5 mg p.o. twice daily with holding parameters Torsemide and Entresto were on pause show patient was advised to follow with cardiology resume when needed. # Hypothyroid, continue Synthroid 50 mcg p.o. daily # Unresponsiveness most likely secondary to hypoglycemia. Currently patient is back to baseline CT head negative for any acute findings # Constipation, resolved, s/p laxatives.   Body mass index is 26.49 kg/m.  Nutrition Interventions:  Pressure Injury 10/27/23 Buttocks Right;Left small open red areas (Active)  10/27/23 0800  Location: Buttocks  Location Orientation: Right;Left  Staging:   Wound Description (Comments): small open red areas  Present on Admission: No     Pressure Injury 12/09/23 Buttocks Right;Left Stage 1 -  Intact skin with non-blanchable redness of a localized area usually over a bony prominence. red and  blanchable /pictures was taken (Active)  12/09/23 0645  Location: Buttocks  Location Orientation: Right;Left  Staging: Stage 1 -  Intact skin with non-blanchable redness of a localized area usually over a bony prominence.  Wound Description (Comments): red and  blanchable /pictures was taken  Present on Admission: Yes  Dressing Type Foam - Lift dressing to  assess site every shift 12/12/23 0940     swimming or participation in water activities or provide baby sitting services while on Pain, Sleep and Anxiety Medications; until his outpatient Physician has advised to do so again.  - Also recommended to not to take more than prescribed Pain, Sleep and Anxiety Medications.  Patient was seen by physical therapy, who recommended Home health, which was arranged. On the day of the discharge the patient's vitals were stable, and no other acute medical condition were reported by patient. the patient was felt safe to be discharge at Home with Home health.  Consultants: None Procedures: None  Discharge Exam: General: Appear in no distress, no Rash; Oral Mucosa Clear, moist. Cardiovascular: S1 and S2 Present, no Murmur, Respiratory: normal respiratory effort, Bilateral Air entry present and no Crackles, no wheezes Abdomen: Bowel Sound present, Soft and no tenderness, no hernia Extremities: no Pedal edema, no calf tenderness Neurology: alert and oriented to time, place, and person affect appropriate.  Filed Weights   12/10/23 0500 12/11/23 0500 12/12/23 0500  Weight: 101.4 kg 98.5 kg 98.7 kg   Vitals:   12/12/23 0838 12/12/23 1213  BP: 136/74 117/73  Pulse: (!) 110 95  Resp: 19 19  Temp: 98.2 F (36.8 C) 98 F (36.7 C)  SpO2: 95% 94%    DISCHARGE MEDICATION: Allergies as of 12/12/2023       Reactions   Aspirin Anaphylaxis   Inderal [propranolol]    Other reaction(s): Other (See Comments) Fatigue   Metformin Hcl Diarrhea   Pt still takes medication   Mevacor [lovastatin] Other (See Comments)   muscle Pain   Penicillin G Hives   Pravachol [pravastatin Sodium] Other (See Comments)   Muscle Pain   Zocor [simvastatin] Other (See Comments)   Muscle Pain   Penicillins Rash   Has patient had a PCN reaction causing immediate rash, facial/tongue/throat swelling, SOB or lightheadedness with hypotension: Yes Has patient had a PCN reaction  causing severe rash involving mucus membranes or skin necrosis: Yes Has patient had a PCN reaction that required hospitalization No Has patient had a PCN reaction occurring within the last 10 years: Yes If all of the above answers are "NO", then may proceed with Cephalosporin use.        Medication List     PAUSE taking these medications    Entresto 49-51 MG Wait to take this until your doctor or other care provider tells you to start again. Generic drug: sacubitril-valsartan Take 1 tablet by mouth 2 (two) times daily.   torsemide 20 MG tablet Wait to take this until your doctor or other care provider tells you to start again. Commonly known as: DEMADEX Take 20 mg by mouth in the morning.       TAKE these medications    acetaminophen 500 MG tablet Commonly known as: TYLENOL Take 500-1,000 mg by mouth every 6 (six) hours as needed (pain.).   amiodarone 200 MG tablet Commonly known as: PACERONE Take 100 mg by mouth in the morning.   cefadroxil 500 MG capsule Commonly known as: DURICEF Take 1 capsule (500 mg total) by mouth 2 (two) times daily for 5 days.   Eliquis 5 MG Tabs tablet Generic drug: apixaban Take 1 tablet (5 mg total) by mouth 2 (two) times daily.   Ensure Max Protein Liqd Take 330 mLs (11 oz total) by mouth 2 (two) times daily.   Farxiga 10 MG Tabs tablet Generic drug: dapagliflozin propanediol Take 10 mg by mouth in the morning.   gabapentin 100 MG capsule Commonly known as: NEURONTIN Take 100 mg by mouth at bedtime as needed (nerve pain.).   Gerhardt's butt cream Crea Apply 1 Application topically 2 (two) times daily.   levothyroxine 50 MCG tablet Commonly known as: SYNTHROID Take 50 mcg by mouth daily at 2 am.   melatonin 5 MG Tabs Take 1 tablet (5 mg total) by mouth at bedtime.   metFORMIN 500 MG 24 hr tablet Commonly known as: GLUCOPHAGE-XR Take 1 tablet (500 mg total) by mouth 2 (two) times daily with a meal.   metoprolol succinate  50 MG 24 hr tablet Commonly known as: TOPROL-XL Take 1 tablet (50 mg total) by mouth in the morning. Take with or immediately following a meal. What changed:  medication strength how much to take   pantoprazole 40 MG tablet Commonly known as: PROTONIX Take 1 tablet (40 mg total) by mouth daily.   rosuvastatin 40 MG tablet Commonly known as: CRESTOR Take 20 mg by mouth at bedtime.   senna-docusate 8.6-50 MG tablet Commonly known as: Senokot-S Take 1 tablet by mouth at bedtime as needed for moderate constipation.  Discharge Care Instructions  (From admission, onward)           Start     Ordered   12/12/23 0000  Discharge wound care:       Comments: As above   12/12/23 1235           Allergies  Allergen Reactions   Aspirin Anaphylaxis   Inderal [Propranolol]     Other reaction(s): Other (See Comments) Fatigue   Metformin Hcl Diarrhea    Pt still takes medication   Mevacor [Lovastatin] Other (See Comments)    muscle Pain   Penicillin G Hives   Pravachol [Pravastatin Sodium] Other (See Comments)    Muscle Pain   Zocor [Simvastatin] Other (See Comments)    Muscle Pain   Penicillins Rash    Has patient had a PCN reaction causing immediate rash, facial/tongue/throat swelling, SOB or lightheadedness with hypotension: Yes Has patient had a PCN reaction causing severe rash involving mucus membranes or skin necrosis: Yes Has patient had a PCN reaction that required hospitalization No Has patient had a PCN reaction occurring within the last 10 years: Yes If all of the above answers are "NO", then may proceed with Cephalosporin use.    Discharge Instructions     Call MD for:  difficulty breathing, headache or visual disturbances   Complete by: As directed    Call MD for:  extreme fatigue   Complete by: As directed    Call MD for:  persistant dizziness or light-headedness   Complete by: As directed    Call MD for:  persistant nausea and  vomiting   Complete by: As directed    Call MD for:  severe uncontrolled pain   Complete by: As directed    Call MD for:  temperature >100.4   Complete by: As directed    Diet - low sodium heart healthy   Complete by: As directed    Discharge instructions   Complete by: As directed    Follow-up with PCP in 1 week Follow-up with cardiology in 1 week, due to low blood pressure decreased Toprol-XL from 100 to 50 mg.  Entresto and torsemide has been paused due to low blood pressure.  Follow with cardiology when to resume all medications.   Discharge wound care:   Complete by: As directed    As above   Increase activity slowly   Complete by: As directed        The results of significant diagnostics from this hospitalization (including imaging, microbiology, ancillary and laboratory) are listed below for reference.    Significant Diagnostic Studies: US RENAL Result Date: 12/09/2023 CLINICAL DATA:  UTI EXAM: RENAL / URINARY TRACT ULTRASOUND COMPLETE COMPARISON:  X-ray 10/27/2023.  CT 10/25/2023. FINDINGS: Right Kidney: Renal measurements: 13.0 x 5.3 x 6.0 cm = volume: 218 mL. No collecting system dilatation or perinephric fluid. Upper pole right-sided renal cyst identified. Anechoic structure with transmission measuring 3 cm. Smaller focus towards the lower pole measuring 2.4 cm also appears simple. Left Kidney: Renal measurements: 13.3 x 6.4 x 4.9 cm = volume: 218 mL. No collecting system dilatation. Anechoic structure seen in the left kidney measuring 2.5 cm consistent with a simple cyst. Bladder: Preserved contour to the urinary bladder. Prevoid volume of 295 cc. Postvoid 121 cc. There is some mild bladder wall thickening. Other: None. IMPRESSION: No collecting system dilatation.  Bilateral renal cysts. Moderate postvoid bladder residual.  Mild bladder wall thickening. Electronically Signed   By: Piedad Climes.D.  On: 12/09/2023 14:51   CT HEAD WO CONTRAST ( ) Result Date:  12/09/2023 CLINICAL DATA:  Syncope/presyncope, cerebrovascular cause suspected EXAM: CT HEAD WITHOUT CONTRAST TECHNIQUE: Contiguous axial images were obtained from the base of the skull through the vertex without intravenous contrast. RADIATION DOSE REDUCTION: This exam was performed according to the departmental dose-optimization program which includes automated exposure control, adjustment of the mA and/or kV according to patient size and/or use of iterative reconstruction technique. COMPARISON:  10/25/2023 FINDINGS: Brain: Mild age related volume loss. No acute intracranial abnormality. Specifically, no hemorrhage, hydrocephalus, mass lesion, acute infarction, or significant intracranial injury. Vascular: No hyperdense vessel or unexpected calcification. Skull: No acute calvarial abnormality. Sinuses/Orbits: Mucosal thickening in the maxillary sinuses. No air-fluid levels. Other: None IMPRESSION: No acute intracranial abnormality. Chronic maxillary sinusitis. Electronically Signed   By: Charlett Nose M.D.   On: 12/09/2023 01:08    Microbiology: Recent Results (from the past 240 hours)  Resp panel by RT-PCR (RSV, Flu A&B, Covid) Anterior Nasal Swab     Status: None   Collection Time: 12/08/23  3:59 PM   Specimen: Anterior Nasal Swab  Result Value Ref Range Status   SARS Coronavirus 2 by RT PCR NEGATIVE NEGATIVE Final    Comment: (NOTE) SARS-CoV-2 target nucleic acids are NOT DETECTED.  The SARS-CoV-2 RNA is generally detectable in upper respiratory specimens during the acute phase of infection. The lowest concentration of SARS-CoV-2 viral copies this assay can detect is 138 copies/mL. A negative result does not preclude SARS-Cov-2 infection and should not be used as the sole basis for treatment or other patient management decisions. A negative result may occur with  improper specimen collection/handling, submission of specimen other than nasopharyngeal swab, presence of viral mutation(s) within  the areas targeted by this assay, and inadequate number of viral copies(<138 copies/mL). A negative result must be combined with clinical observations, patient history, and epidemiological information. The expected result is Negative.  Fact Sheet for Patients:  BloggerCourse.com  Fact Sheet for Healthcare Providers:  SeriousBroker.it  This test is no t yet approved or cleared by the [place] FDA and  has been authorized for detection and/or diagnosis of SARS-CoV-2 by FDA under an Emergency Use Authorization (EUA). This EUA will remain  in effect (meaning this test can be used) for the duration of the COVID-19 declaration under Section 564(b)(1) of the Act, 21 U.S.C.section 360bbb-3(b)(1), unless the authorization is terminated  or revoked sooner.       Influenza A by PCR NEGATIVE NEGATIVE Final   Influenza B by PCR NEGATIVE NEGATIVE Final    Comment: (NOTE) The Xpert Xpress SARS-CoV-2/FLU/RSV plus assay is intended as an aid in the diagnosis of influenza from Nasopharyngeal swab specimens and should not be used as a sole basis for treatment. Nasal washings and aspirates are unacceptable for Xpert Xpress SARS-CoV-2/FLU/RSV testing.  Fact Sheet for Patients: BloggerCourse.com  Fact Sheet for Healthcare Providers: SeriousBroker.it  This test is not yet approved or cleared by the [place] FDA and has been authorized for detection and/or diagnosis of SARS-CoV-2 by FDA under an Emergency Use Authorization (EUA). This EUA will remain in effect (meaning this test can be used) for the duration of the COVID-19 declaration under Section 564(b)(1) of the Act, 21 U.S.C. section 360bbb-3(b)(1), unless the authorization is terminated or revoked.     Resp Syncytial Virus by PCR NEGATIVE NEGATIVE Final    Comment: (NOTE) Fact Sheet for  Patients: BloggerCourse.com  Fact Sheet for Healthcare Providers: SeriousBroker.it  This test is not yet approved or cleared by the [place] and has been authorized for detection and/or diagnosis of SARS-CoV-2 by FDA under an Emergency Use Authorization (EUA). This EUA will remain in effect (meaning this test can be used) for the duration of the COVID-19 declaration under Section 564(b)(1) of the Act, 21 U.S.C. section 360bbb-3(b)(1), unless the authorization is terminated or revoked.  Performed at [facility], [address], [place], [place] [address]   Urine Culture     Status: Abnormal   Collection Time: 12/08/23  3:59 PM   Specimen: Urine, Clean Catch  Result Value Ref Range Status   Specimen Description   Final    URINE, CLEAN CATCH Performed at [facility], [address] [place], [place] [address]    Special Requests   Final    NONE Reflexed from [id number] Performed at [facility], [address] [place], [place] [address]    Culture >=100,000 COLONIES/mL KLEBSIELLA PNEUMONIAE (A)  Final   Report Status 12/10/2023 FINAL  Final   Organism ID, Bacteria KLEBSIELLA PNEUMONIAE (A)  Final      Susceptibility   Klebsiella pneumoniae - MIC*    AMPICILLIN RESISTANT Resistant     CEFAZOLIN <=4 SENSITIVE Sensitive     CEFEPIME <=0.12 SENSITIVE Sensitive     CEFTRIAXONE <=0.25 SENSITIVE Sensitive     CIPROFLOXACIN <=0.25 SENSITIVE Sensitive     GENTAMICIN <=1 SENSITIVE Sensitive     IMIPENEM <=0.25 SENSITIVE Sensitive     NITROFURANTOIN 32 SENSITIVE Sensitive     TRIMETH/SULFA <=20 SENSITIVE Sensitive     AMPICILLIN/SULBACTAM 4 SENSITIVE Sensitive     PIP/TAZO <=4 SENSITIVE Sensitive ug/mL    * >=100,000 COLONIES/mL KLEBSIELLA PNEUMONIAE  MRSA Next Gen by PCR, Nasal     Status: None   Collection Time: 12/09/23  6:48 AM   Specimen: Nasal Mucosa; Nasal Swab  Result Value Ref Range Status    MRSA by PCR Next Gen NOT DETECTED NOT DETECTED Final    Comment: (NOTE) The GeneXpert MRSA Assay (FDA approved for NASAL specimens only), is one component of a comprehensive MRSA colonization surveillance program. It is not intended to diagnose MRSA infection nor to guide or monitor treatment for MRSA infections. Test performance is not FDA approved in patients less than 44 years old. Performed at [facility], [address] [place], [place] [address]   Culture, blood (Routine X 2) w Reflex to ID Panel     Status: None (Preliminary result)   Collection Time: 12/09/23  7:37 AM   Specimen: BLOOD  Result Value Ref Range Status   Specimen Description BLOOD BLOOD LEFT HAND  Final   Special Requests   Final    BOTTLES DRAWN AEROBIC AND ANAEROBIC Blood Culture results may not be optimal due to an inadequate volume of blood received in culture bottles   Culture   Final    NO GROWTH 4 DAYS Performed at [facility], [address] [place], [place] [address]    Report Status PENDING  Incomplete  Culture, blood (Routine X 2) w Reflex to ID Panel     Status: None (Preliminary result)   Collection Time: 12/09/23 11:08 AM   Specimen: BLOOD  Result Value Ref Range Status   Specimen Description BLOOD BLOOD LEFT HAND  Final   Special Requests   Final    BOTTLES DRAWN AEROBIC AND ANAEROBIC Blood Culture adequate volume   Culture   Final  NO GROWTH 4 DAYS Performed at [facility], [address] [place], [place] [address]    Report Status PENDING  Incomplete     Labs: CBC: Recent Labs  Lab 12/08/23 1559 12/09/23 0402 12/10/23 0433 12/11/23 0710 12/12/23 0459  WBC 11.7* 8.8 7.6 7.4 5.4  NEUTROABS 10.3*  --   --   --   --   HGB 10.5* 8.0* 7.8* 8.7* 8.2*  HCT 31.9* 25.3* 24.7* 27.4* 25.8*  MCV 91.4 92.3 91.5 93.2 91.2  PLT 193 108* 124* 144* 131*   Basic Metabolic Panel: Recent Labs  Lab 12/08/23 1559 12/09/23 0402 12/09/23 1108  12/09/23 1109 12/09/23 1720 12/09/23 2139 12/10/23 0433 12/11/23 0710 12/12/23 0459  NA 136 134*  --    < > 137 137 138 136 137  K 3.3* 3.5  --    < > 4.0 4.1 3.7 4.0 4.5  CL 99 104  --    < > 103 105 109 107 106  CO2 25 22  --    < > 21* 20* 24 21* 27  GLUCOSE 146* 69*  --    < > 326* 345* 173* 113* 118*  BUN 19 16  --    < > 16 17 17 14 15   CREATININE 1.07 0.95  --    < > 1.06 1.01 0.94 0.94 0.94  CALCIUM 7.9* 7.0*  --    < > 7.6* 7.9* 7.9* 8.3* 8.5*  MG 1.2* 1.3* 1.8  --   --   --  2.0 1.9 1.8  PHOS 3.2 2.1*  --   --   --   --  3.1 1.9* 2.5   < > = values in this interval not displayed.   Liver Function Tests: Recent Labs  Lab 12/09/23 1108  AST 17  ALT 11  ALKPHOS 37*  BILITOT 0.9  PROT 5.0*  ALBUMIN 2.6*   No results for input(s): "LIPASE", "AMYLASE" in the last 168 hours. No results for input(s): "AMMONIA" in the last 168 hours. Cardiac Enzymes: Recent Labs  Lab 12/09/23 1108  CKTOTAL 102   BNP (last 3 results) Recent Labs    10/25/23 0204 12/08/23 1559  BNP 463.1* 343.6*   CBG: Recent Labs  Lab 12/11/23 1124 12/11/23 1541 12/11/23 2206 12/12/23 0846 12/12/23 1212  GLUCAP 156* 156* 125* 109* 174*    Time spent: 35 minutes  Signed:  Gillis Santa  [facility] 12/12/2023  5:34 PM

## 2023-12-14 LAB — CULTURE, BLOOD (ROUTINE X 2)
Culture: NO GROWTH
Culture: NO GROWTH
Special Requests: ADEQUATE

## 2024-01-13 ENCOUNTER — Encounter: Payer: Self-pay | Admitting: Emergency Medicine

## 2024-01-13 ENCOUNTER — Other Ambulatory Visit: Payer: Self-pay

## 2024-01-13 ENCOUNTER — Inpatient Hospital Stay
Admission: EM | Admit: 2024-01-13 | Discharge: 2024-01-20 | DRG: 291 | Disposition: A | Discharge status: Home Health | Source: Ambulatory Visit | Attending: Obstetrics and Gynecology | Admitting: Obstetrics and Gynecology

## 2024-01-13 ENCOUNTER — Emergency Department

## 2024-01-13 DIAGNOSIS — D696 Thrombocytopenia, unspecified: Secondary | ICD-10-CM | POA: Diagnosis present

## 2024-01-13 DIAGNOSIS — Z1152 Encounter for screening for COVID-19: Secondary | ICD-10-CM

## 2024-01-13 DIAGNOSIS — I110 Hypertensive heart disease with heart failure: Secondary | ICD-10-CM | POA: Diagnosis not present

## 2024-01-13 DIAGNOSIS — Z7984 Long term (current) use of oral hypoglycemic drugs: Secondary | ICD-10-CM

## 2024-01-13 DIAGNOSIS — Z961 Presence of intraocular lens: Secondary | ICD-10-CM | POA: Diagnosis present

## 2024-01-13 DIAGNOSIS — I130 Hypertensive heart and chronic kidney disease with heart failure and stage 1 through stage 4 chronic kidney disease, or unspecified chronic kidney disease: Principal | ICD-10-CM | POA: Diagnosis present

## 2024-01-13 DIAGNOSIS — Z5309 Procedure and treatment not carried out because of other contraindication: Secondary | ICD-10-CM | POA: Diagnosis not present

## 2024-01-13 DIAGNOSIS — Z79890 Hormone replacement therapy (postmenopausal): Secondary | ICD-10-CM

## 2024-01-13 DIAGNOSIS — K0889 Other specified disorders of teeth and supporting structures: Secondary | ICD-10-CM | POA: Diagnosis present

## 2024-01-13 DIAGNOSIS — I2510 Atherosclerotic heart disease of native coronary artery without angina pectoris: Secondary | ICD-10-CM | POA: Diagnosis present

## 2024-01-13 DIAGNOSIS — Z9841 Cataract extraction status, right eye: Secondary | ICD-10-CM

## 2024-01-13 DIAGNOSIS — Z720 Tobacco use: Secondary | ICD-10-CM

## 2024-01-13 DIAGNOSIS — I10 Essential (primary) hypertension: Secondary | ICD-10-CM | POA: Diagnosis present

## 2024-01-13 DIAGNOSIS — Z880 Allergy status to penicillin: Secondary | ICD-10-CM

## 2024-01-13 DIAGNOSIS — Z7901 Long term (current) use of anticoagulants: Secondary | ICD-10-CM

## 2024-01-13 DIAGNOSIS — R011 Cardiac murmur, unspecified: Secondary | ICD-10-CM | POA: Diagnosis not present

## 2024-01-13 DIAGNOSIS — I252 Old myocardial infarction: Secondary | ICD-10-CM

## 2024-01-13 DIAGNOSIS — E1122 Type 2 diabetes mellitus with diabetic chronic kidney disease: Secondary | ICD-10-CM | POA: Diagnosis present

## 2024-01-13 DIAGNOSIS — Z95810 Presence of automatic (implantable) cardiac defibrillator: Secondary | ICD-10-CM

## 2024-01-13 DIAGNOSIS — N179 Acute kidney failure, unspecified: Secondary | ICD-10-CM | POA: Diagnosis present

## 2024-01-13 DIAGNOSIS — Z87892 Personal history of anaphylaxis: Secondary | ICD-10-CM

## 2024-01-13 DIAGNOSIS — Z8249 Family history of ischemic heart disease and other diseases of the circulatory system: Secondary | ICD-10-CM

## 2024-01-13 DIAGNOSIS — E1142 Type 2 diabetes mellitus with diabetic polyneuropathy: Secondary | ICD-10-CM | POA: Diagnosis present

## 2024-01-13 DIAGNOSIS — I480 Paroxysmal atrial fibrillation: Secondary | ICD-10-CM

## 2024-01-13 DIAGNOSIS — R053 Chronic cough: Secondary | ICD-10-CM | POA: Diagnosis present

## 2024-01-13 DIAGNOSIS — Z9889 Other specified postprocedural states: Secondary | ICD-10-CM

## 2024-01-13 DIAGNOSIS — K219 Gastro-esophageal reflux disease without esophagitis: Secondary | ICD-10-CM | POA: Diagnosis present

## 2024-01-13 DIAGNOSIS — I4891 Unspecified atrial fibrillation: Secondary | ICD-10-CM | POA: Diagnosis present

## 2024-01-13 DIAGNOSIS — I5043 Acute on chronic combined systolic (congestive) and diastolic (congestive) heart failure: Principal | ICD-10-CM | POA: Diagnosis present

## 2024-01-13 DIAGNOSIS — M1990 Unspecified osteoarthritis, unspecified site: Secondary | ICD-10-CM | POA: Diagnosis present

## 2024-01-13 DIAGNOSIS — I482 Chronic atrial fibrillation, unspecified: Secondary | ICD-10-CM

## 2024-01-13 DIAGNOSIS — Z888 Allergy status to other drugs, medicaments and biological substances status: Secondary | ICD-10-CM

## 2024-01-13 DIAGNOSIS — R0902 Hypoxemia: Secondary | ICD-10-CM | POA: Diagnosis present

## 2024-01-13 DIAGNOSIS — I513 Intracardiac thrombosis, not elsewhere classified: Secondary | ICD-10-CM | POA: Diagnosis present

## 2024-01-13 DIAGNOSIS — Z79899 Other long term (current) drug therapy: Secondary | ICD-10-CM

## 2024-01-13 DIAGNOSIS — Z9049 Acquired absence of other specified parts of digestive tract: Secondary | ICD-10-CM

## 2024-01-13 DIAGNOSIS — D649 Anemia, unspecified: Secondary | ICD-10-CM | POA: Diagnosis present

## 2024-01-13 DIAGNOSIS — E785 Hyperlipidemia, unspecified: Secondary | ICD-10-CM | POA: Insufficient documentation

## 2024-01-13 DIAGNOSIS — E039 Hypothyroidism, unspecified: Secondary | ICD-10-CM | POA: Diagnosis present

## 2024-01-13 DIAGNOSIS — Z9842 Cataract extraction status, left eye: Secondary | ICD-10-CM

## 2024-01-13 DIAGNOSIS — N1831 Chronic kidney disease, stage 3a: Secondary | ICD-10-CM | POA: Diagnosis present

## 2024-01-13 DIAGNOSIS — E782 Mixed hyperlipidemia: Secondary | ICD-10-CM | POA: Diagnosis present

## 2024-01-13 DIAGNOSIS — Z7902 Long term (current) use of antithrombotics/antiplatelets: Secondary | ICD-10-CM

## 2024-01-13 DIAGNOSIS — I4819 Other persistent atrial fibrillation: Secondary | ICD-10-CM | POA: Diagnosis present

## 2024-01-13 DIAGNOSIS — Z886 Allergy status to analgesic agent status: Secondary | ICD-10-CM

## 2024-01-13 DIAGNOSIS — Z951 Presence of aortocoronary bypass graft: Secondary | ICD-10-CM

## 2024-01-13 DIAGNOSIS — I447 Left bundle-branch block, unspecified: Secondary | ICD-10-CM | POA: Diagnosis present

## 2024-01-13 DIAGNOSIS — I5033 Acute on chronic diastolic (congestive) heart failure: Secondary | ICD-10-CM | POA: Diagnosis present

## 2024-01-13 DIAGNOSIS — E876 Hypokalemia: Secondary | ICD-10-CM | POA: Diagnosis present

## 2024-01-13 DIAGNOSIS — E669 Obesity, unspecified: Secondary | ICD-10-CM | POA: Diagnosis present

## 2024-01-13 LAB — BASIC METABOLIC PANEL
Anion gap: 7 (ref 5–15)
BUN: 18 mg/dL (ref 8–23)
CO2: 32 mmol/L (ref 22–32)
Calcium: 9 mg/dL (ref 8.9–10.3)
Chloride: 100 mmol/L (ref 98–111)
Creatinine, Ser: 1.39 mg/dL — ABNORMAL HIGH (ref 0.61–1.24)
GFR, Estimated: 54 mL/min — ABNORMAL LOW (ref >60)
Glucose, Bld: 155 mg/dL — ABNORMAL HIGH (ref 70–99)
Potassium: 2.9 mmol/L — ABNORMAL LOW (ref 3.5–5.1)
Sodium: 139 mmol/L (ref 135–145)

## 2024-01-13 LAB — CBC
HCT: 32.2 % — ABNORMAL LOW (ref 39.0–52.0)
Hemoglobin: 9.9 g/dL — ABNORMAL LOW (ref 13.0–17.0)
MCH: 28.4 pg (ref 26.0–34.0)
MCHC: 30.7 g/dL (ref 30.0–36.0)
MCV: 92.5 fL (ref 80.0–100.0)
Platelets: 115 10*3/uL — ABNORMAL LOW (ref 150–400)
RBC: 3.48 MIL/uL — ABNORMAL LOW (ref 4.22–5.81)
RDW: 14.6 % (ref 11.5–15.5)
WBC: 4.7 10*3/uL (ref 4.0–10.5)
nRBC: 0 % (ref 0.0–0.2)

## 2024-01-13 LAB — APTT: aPTT: 36 s (ref 24–36)

## 2024-01-13 LAB — MAGNESIUM: Magnesium: 2.1 mg/dL (ref 1.7–2.4)

## 2024-01-13 LAB — BRAIN NATRIURETIC PEPTIDE: B Natriuretic Peptide: 264.5 pg/mL — ABNORMAL HIGH (ref 0.0–100.0)

## 2024-01-13 LAB — TROPONIN I (HIGH SENSITIVITY): Troponin I (High Sensitivity): 10 ng/L (ref <18)

## 2024-01-13 MED ORDER — POTASSIUM CHLORIDE CRYS ER 20 MEQ PO TBCR: 20.0000 meq | EXTENDED_RELEASE_TABLET | Freq: Every day | ORAL | 0 refills | Status: DC

## 2024-01-13 MED ORDER — FUROSEMIDE 10 MG/ML IJ SOLN
80.0000 mg | Freq: Once | INTRAMUSCULAR | Status: AC
  Administered 2024-01-13: 80 mg via INTRAVENOUS
  Filled 2024-01-13: qty 8

## 2024-01-13 MED ORDER — POTASSIUM CHLORIDE CRYS ER 20 MEQ PO TBCR
40.0000 meq | EXTENDED_RELEASE_TABLET | Freq: Once | ORAL | Status: AC
  Administered 2024-01-14: 40 meq via ORAL
  Filled 2024-01-13: qty 2

## 2024-01-13 MED ORDER — POTASSIUM CHLORIDE CRYS ER 20 MEQ PO TBCR
40.0000 meq | EXTENDED_RELEASE_TABLET | Freq: Once | ORAL | Status: AC
  Administered 2024-01-13: 40 meq via ORAL
  Filled 2024-01-13: qty 2

## 2024-01-13 NOTE — ED Provider Triage Note (Signed)
 Emergency Medicine Provider Triage Evaluation Note  Jason [name] , a 73 y.o. male  was evaluated in triage.  Pt complains of shortness of breath with history of CHF an A-fib. Sent by PCP for CHF exacerbation.  Physical Exam  BP 107/63   Pulse 72   Resp 17   Ht 6\' 4"  (1.93 m)   Wt 98.7 kg   SpO2 98%   BMI 26.49 kg/m  Gen:   Awake, no distress   Resp:  Normal effort  MSK:   Moves extremities without difficulty  Other:    Medical Decision Making  Medically screening exam initiated at 2:51 PM.  Appropriate orders placed.  Jason [name] was informed that the remainder of the evaluation will be completed by another provider, this initial triage assessment does not replace that evaluation, and the importance of remaining in the ED until their evaluation is complete.  Protocols started.   Jason Pester, FNP 01/13/24 1452

## 2024-01-13 NOTE — ED Triage Notes (Signed)
 Pt here with SOB. Pt was sent here from his provider r/t his persistent atrial fibrillation. Pt states he has a hx of HF and takes a fluid pill. Pt denies cp. Pt is on a blood thinner.

## 2024-01-13 NOTE — ED Provider Notes (Signed)
 ----------------------------------------- 11:18 PM on 01/13/2024 -----------------------------------------  Assuming care from Dr. Larinda Buttery.  In short, Jason [name] is a 73 y.o. male with a chief complaint of CHF exacerbation.  Refer to the original H&P for additional details.  The current plan of care is to reassess after ambulation trial to determine outpatient vs inpatient needs.   Clinical Course as of 01/14/24 0118  Fri Jan 14, 2024  0051 Patient became dyspneic and dropped to 80% on room air during ambulation trial.  I reassessed him and he is still feeling short of breath sitting on the side of the bed.  He said that he is frustrated by the symptoms and at this point he would rather stay.  He has diuresed about 2 L so far since coming to the emergency department, but given his pleural effusions, peripheral edema, CHF exacerbation, and hypoxia with ambulation, I think it is reasonable to admit him to the hospital service.  I am consulting them for further treatment.  Of note, I offered the patient oxygen for comfort but he declined because he said he feels okay as long as he is sitting still. [CF]  0118 Lipid with Dr. Arville Care with the hospitalist service who will admit the patient. [CF]    Clinical Course User Index [CF] Loleta Rose, MD     Medications  amiodarone (PACERONE) tablet 100 mg (has no administration in time range)  metoprolol succinate (TOPROL-XL) 24 hr tablet 50 mg (has no administration in time range)  rosuvastatin (CRESTOR) tablet 20 mg (has no administration in time range)  dapagliflozin propanediol (FARXIGA) tablet 10 mg (has no administration in time range)  levothyroxine (SYNTHROID) tablet 50 mcg (has no administration in time range)  pantoprazole (PROTONIX) EC tablet 40 mg (has no administration in time range)  senna-docusate (Senokot-S) tablet 1 tablet (has no administration in time range)  apixaban (ELIQUIS) tablet 5 mg (has no administration in time range)   melatonin tablet 5 mg (has no administration in time range)  gabapentin (NEURONTIN) capsule 100 mg (has no administration in time range)  protein supplement (ENSURE MAX) liquid (has no administration in time range)  furosemide (LASIX) injection 40 mg (has no administration in time range)  acetaminophen (TYLENOL) tablet 650 mg (has no administration in time range)    Or  acetaminophen (TYLENOL) suppository 650 mg (has no administration in time range)  traZODone (DESYREL) tablet 25 mg (has no administration in time range)  magnesium hydroxide (MILK OF MAGNESIA) suspension 30 mL (has no administration in time range)  ondansetron (ZOFRAN) tablet 4 mg (has no administration in time range)    Or  ondansetron (ZOFRAN) injection 4 mg (has no administration in time range)  furosemide (LASIX) injection 80 mg (80 mg Intravenous Given 01/13/24 2243)  potassium chloride SA (KLOR-CON M) CR tablet 40 mEq (40 mEq Oral Given 01/13/24 2311)  potassium chloride SA (KLOR-CON M) CR tablet 40 mEq (40 mEq Oral Given 01/14/24 0049)     ED Discharge Orders          Ordered    Ambulatory referral to Cardiology        01/13/24 2323    AMB referral to CHF clinic        01/13/24 2323    potassium chloride SA (KLOR-CON M) 20 MEQ tablet  Daily        01/13/24 2323           Final diagnoses:  Acute on chronic combined systolic and diastolic congestive heart failure (  HCC)  Hypokalemia  Atrial fibrillation, unspecified type (HCC)  Paroxysmal atrial fibrillation (HCC)     Loleta Rose, MD 01/14/24 [phone]

## 2024-01-13 NOTE — ED Provider Notes (Signed)
 [facility] Provider Note    Event Date/Time   First MD Initiated Contact with Patient 01/13/24 2132     (approximate)   History   Chief Complaint Shortness of Breath   HPI  Jason [name] is a 73 y.o. male with past medical history of hypertension, hyperlipidemia, diabetes, CAD status post [name], CHF, and atrial fibrillation on Eliquis who presents to the ED complaining of shortness of breath.  Patient reports that he has been feeling increasingly short of breath over the past [redacted] weeks along with increasing swelling in his legs.  He states that for the past 2 weeks he has been unable to lay down in his bed at night, has been sleeping in a recliner.  He denies any associated fevers, cough, or pain in his chest.  He states he has been taking his diuretic medication as prescribed.  He went to see his cardiologist earlier today, who told him he was in atrial fibrillation and needed to come to the hospital for diuresis.     Physical Exam   Triage Vital Signs: ED Triage Vitals  Encounter Vitals Group     BP 01/13/24 1449 107/63     Systolic BP Percentile --      Diastolic BP Percentile --      Pulse Rate 01/13/24 1449 72     Resp 01/13/24 1449 17     Temp 01/13/24 1453 98 F (36.7 C)     Temp Source 01/13/24 1453 Oral     SpO2 01/13/24 1449 98 %     Weight 01/13/24 1449 217 lb 9.5 oz (98.7 kg)     Height 01/13/24 1449 6\' 4"  (1.93 m)     Head Circumference --      Peak Flow --      Pain Score 01/13/24 1449 7     Pain Loc --      Pain Education --      Exclude from Growth Chart --     Most recent vital signs: Vitals:   01/13/24 2245 01/13/24 2300  BP: 131/76 121/73  Pulse: 74 76  Resp: 17 20  Temp:    SpO2: 95% 93%    Constitutional: Alert and oriented. Eyes: Conjunctivae are normal. Head: Atraumatic. Nose: No congestion/rhinnorhea. Mouth/Throat: Mucous membranes are moist.  Cardiovascular: Normal rate, irregularly irregular rhythm. Grossly  normal heart sounds.  2+ radial pulses bilaterally. Respiratory: Normal respiratory effort.  No retractions. Lungs with crackles to bilateral bases. Gastrointestinal: Soft and nontender. No distention. Musculoskeletal: No lower extremity tenderness, 2+ pitting edema to mid thighs bilaterally.  Neurologic:  Normal speech and language. No gross focal neurologic deficits are appreciated.    ED Results / Procedures / Treatments   Labs (all labs ordered are listed, but only abnormal results are displayed) Labs Reviewed  BASIC METABOLIC PANEL - Abnormal; Notable for the following components:      Result Value   Potassium 2.9 (*)    Glucose, Bld 155 (*)    Creatinine, Ser 1.39 (*)    GFR, Estimated 54 (*)    All other components within normal limits  CBC - Abnormal; Notable for the following components:   RBC 3.48 (*)    Hemoglobin 9.9 (*)    HCT 32.2 (*)    Platelets 115 (*)    All other components within normal limits  BRAIN NATRIURETIC PEPTIDE - Abnormal; Notable for the following components:   B Natriuretic Peptide 264.5 (*)  All other components within normal limits  APTT  MAGNESIUM  TROPONIN I (HIGH SENSITIVITY)     EKG  ED ECG REPORT I, Chesley Noon, the attending physician, personally viewed and interpreted this ECG.   Date: 01/13/2024  EKG Time: 14:52  Rate: 67  Rhythm: atrial fibrillation  Axis: Normal  Intervals:left bundle branch block  ST&T Change: None  RADIOLOGY Chest x-ray reviewed and interpreted by me with small bilateral effusions, no focal infiltrate or edema noted.  PROCEDURES:  Critical Care performed: No  Procedures   MEDICATIONS ORDERED IN ED: Medications  potassium chloride SA (KLOR-CON M) CR tablet 40 mEq (has no administration in time range)  furosemide (LASIX) injection 80 mg (80 mg Intravenous Given 01/13/24 2243)  potassium chloride SA (KLOR-CON M) CR tablet 40 mEq (40 mEq Oral Given 01/13/24 2311)     IMPRESSION / MDM /  ASSESSMENT AND PLAN / ED COURSE  I reviewed the triage vital signs and the nursing notes.                              73 y.o. male with past medical history of hypertension, hyperlipidemia, diabetes, CAD status post [name], CHF, and atrial fibrillation on Eliquis who presents to the ED complaining of increased leg swelling with dyspnea on exertion and orthopnea for the past 3 weeks.  Patient's presentation is most consistent with acute presentation with potential threat to life or bodily function.  Differential diagnosis includes, but is not limited to, ACS, arrhythmia, PE, CHF exacerbation, COPD, pneumonia, anemia, electrolyte abnormality, AKI.  Patient nontoxic-appearing and in no acute distress, vital signs are unremarkable.  He is not in any respiratory distress and maintaining oxygen saturations at 98% on room air.  He does appear clinically fluid overloaded, chest x-ray does show small bilateral pleural effusions but no significant pulmonary edema or focal infiltrate.  Labs with hypokalemia and mild AKI, anemia stable compared to previous with no significant leukocytosis.  BNP mildly elevated, troponin within normal limits and I doubt ACS or PE.  We will diurese with IV Lasix and replete potassium, plan to reassess following diuresis to determine disposition.  Case discussed with Dr. Darrold Junker of cardiology, who agrees that there is no emergent indication for cardioversion and if patient does well following diuresis, may follow-up with cardiology as an outpatient for this.  Patient turned over to oncoming provider pending ambulation following diuresis.      FINAL CLINICAL IMPRESSION(S) / ED DIAGNOSES   Final diagnoses:  Acute on chronic combined systolic and diastolic congestive heart failure (HCC)  Hypokalemia  Atrial fibrillation, unspecified type [facility])     Rx / DC Orders   ED Discharge Orders          Ordered    Ambulatory referral to Cardiology        01/13/24 2323    AMB  referral to CHF clinic        01/13/24 2323    potassium chloride SA (KLOR-CON M) 20 MEQ tablet  Daily        01/13/24 2323             Note:  This document was prepared using Dragon voice recognition software and may include unintentional dictation errors.   Chesley Noon, MD 01/13/24 [phone]

## 2024-01-14 ENCOUNTER — Encounter: Payer: Self-pay | Admitting: Family Medicine

## 2024-01-14 DIAGNOSIS — E782 Mixed hyperlipidemia: Secondary | ICD-10-CM | POA: Diagnosis present

## 2024-01-14 DIAGNOSIS — I5043 Acute on chronic combined systolic (congestive) and diastolic (congestive) heart failure: Secondary | ICD-10-CM | POA: Diagnosis present

## 2024-01-14 DIAGNOSIS — K219 Gastro-esophageal reflux disease without esophagitis: Secondary | ICD-10-CM | POA: Diagnosis present

## 2024-01-14 DIAGNOSIS — I2510 Atherosclerotic heart disease of native coronary artery without angina pectoris: Secondary | ICD-10-CM | POA: Diagnosis present

## 2024-01-14 DIAGNOSIS — I5033 Acute on chronic diastolic (congestive) heart failure: Secondary | ICD-10-CM | POA: Diagnosis not present

## 2024-01-14 DIAGNOSIS — K0889 Other specified disorders of teeth and supporting structures: Secondary | ICD-10-CM | POA: Diagnosis present

## 2024-01-14 DIAGNOSIS — Z7901 Long term (current) use of anticoagulants: Secondary | ICD-10-CM | POA: Diagnosis not present

## 2024-01-14 DIAGNOSIS — R011 Cardiac murmur, unspecified: Secondary | ICD-10-CM | POA: Diagnosis not present

## 2024-01-14 DIAGNOSIS — D649 Anemia, unspecified: Secondary | ICD-10-CM | POA: Diagnosis present

## 2024-01-14 DIAGNOSIS — E876 Hypokalemia: Secondary | ICD-10-CM

## 2024-01-14 DIAGNOSIS — I513 Intracardiac thrombosis, not elsewhere classified: Secondary | ICD-10-CM | POA: Diagnosis present

## 2024-01-14 DIAGNOSIS — N179 Acute kidney failure, unspecified: Secondary | ICD-10-CM

## 2024-01-14 DIAGNOSIS — I482 Chronic atrial fibrillation, unspecified: Secondary | ICD-10-CM | POA: Diagnosis not present

## 2024-01-14 DIAGNOSIS — R053 Chronic cough: Secondary | ICD-10-CM | POA: Diagnosis present

## 2024-01-14 DIAGNOSIS — E039 Hypothyroidism, unspecified: Secondary | ICD-10-CM | POA: Diagnosis present

## 2024-01-14 DIAGNOSIS — E669 Obesity, unspecified: Secondary | ICD-10-CM | POA: Diagnosis present

## 2024-01-14 DIAGNOSIS — E1142 Type 2 diabetes mellitus with diabetic polyneuropathy: Secondary | ICD-10-CM | POA: Diagnosis present

## 2024-01-14 DIAGNOSIS — Z961 Presence of intraocular lens: Secondary | ICD-10-CM | POA: Diagnosis present

## 2024-01-14 DIAGNOSIS — I4819 Other persistent atrial fibrillation: Secondary | ICD-10-CM | POA: Diagnosis present

## 2024-01-14 DIAGNOSIS — Z95810 Presence of automatic (implantable) cardiac defibrillator: Secondary | ICD-10-CM | POA: Insufficient documentation

## 2024-01-14 DIAGNOSIS — Z1152 Encounter for screening for COVID-19: Secondary | ICD-10-CM | POA: Diagnosis not present

## 2024-01-14 DIAGNOSIS — R0902 Hypoxemia: Secondary | ICD-10-CM | POA: Diagnosis present

## 2024-01-14 DIAGNOSIS — D696 Thrombocytopenia, unspecified: Secondary | ICD-10-CM | POA: Diagnosis present

## 2024-01-14 DIAGNOSIS — I4891 Unspecified atrial fibrillation: Secondary | ICD-10-CM | POA: Diagnosis not present

## 2024-01-14 DIAGNOSIS — E785 Hyperlipidemia, unspecified: Secondary | ICD-10-CM

## 2024-01-14 DIAGNOSIS — I447 Left bundle-branch block, unspecified: Secondary | ICD-10-CM | POA: Diagnosis present

## 2024-01-14 DIAGNOSIS — I4811 Longstanding persistent atrial fibrillation: Secondary | ICD-10-CM | POA: Diagnosis not present

## 2024-01-14 DIAGNOSIS — I110 Hypertensive heart disease with heart failure: Secondary | ICD-10-CM | POA: Diagnosis present

## 2024-01-14 DIAGNOSIS — Z5309 Procedure and treatment not carried out because of other contraindication: Secondary | ICD-10-CM | POA: Diagnosis not present

## 2024-01-14 DIAGNOSIS — I10 Essential (primary) hypertension: Secondary | ICD-10-CM | POA: Diagnosis not present

## 2024-01-14 DIAGNOSIS — M1990 Unspecified osteoarthritis, unspecified site: Secondary | ICD-10-CM | POA: Diagnosis present

## 2024-01-14 HISTORY — DX: Gastro-esophageal reflux disease without esophagitis: K21.9

## 2024-01-14 LAB — CBC WITH DIFFERENTIAL/PLATELET
Abs Immature Granulocytes: 0.01 10*3/uL (ref 0.00–0.07)
Basophils Absolute: 0 10*3/uL (ref 0.0–0.1)
Basophils Relative: 1 %
Eosinophils Absolute: 0.1 10*3/uL (ref 0.0–0.5)
Eosinophils Relative: 2 %
HCT: 33.5 % — ABNORMAL LOW (ref 39.0–52.0)
Hemoglobin: 10.1 g/dL — ABNORMAL LOW (ref 13.0–17.0)
Immature Granulocytes: 0 %
Lymphocytes Relative: 22 %
Lymphs Abs: 0.9 10*3/uL (ref 0.7–4.0)
MCH: 28.1 pg (ref 26.0–34.0)
MCHC: 30.1 g/dL (ref 30.0–36.0)
MCV: 93.3 fL (ref 80.0–100.0)
Monocytes Absolute: 0.6 10*3/uL (ref 0.1–1.0)
Monocytes Relative: 13 %
Neutro Abs: 2.5 10*3/uL (ref 1.7–7.7)
Neutrophils Relative %: 62 %
Platelets: 102 10*3/uL — ABNORMAL LOW (ref 150–400)
RBC: 3.59 MIL/uL — ABNORMAL LOW (ref 4.22–5.81)
RDW: 14.5 % (ref 11.5–15.5)
WBC: 4.2 10*3/uL (ref 4.0–10.5)
nRBC: 0 % (ref 0.0–0.2)

## 2024-01-14 LAB — CBC
HCT: 32.3 % — ABNORMAL LOW (ref 39.0–52.0)
Hemoglobin: 10 g/dL — ABNORMAL LOW (ref 13.0–17.0)
MCH: 28 pg (ref 26.0–34.0)
MCHC: 31 g/dL (ref 30.0–36.0)
MCV: 90.5 fL (ref 80.0–100.0)
Platelets: 103 10*3/uL — ABNORMAL LOW (ref 150–400)
RBC: 3.57 MIL/uL — ABNORMAL LOW (ref 4.22–5.81)
RDW: 14.6 % (ref 11.5–15.5)
WBC: 4.2 10*3/uL (ref 4.0–10.5)
nRBC: 0 % (ref 0.0–0.2)

## 2024-01-14 LAB — BASIC METABOLIC PANEL
Anion gap: 6 (ref 5–15)
BUN: 17 mg/dL (ref 8–23)
CO2: 36 mmol/L — ABNORMAL HIGH (ref 22–32)
Calcium: 9.1 mg/dL (ref 8.9–10.3)
Chloride: 103 mmol/L (ref 98–111)
Creatinine, Ser: 1.27 mg/dL — ABNORMAL HIGH (ref 0.61–1.24)
GFR, Estimated: 60 mL/min — ABNORMAL LOW (ref >60)
Glucose, Bld: 182 mg/dL — ABNORMAL HIGH (ref 70–99)
Potassium: 3.2 mmol/L — ABNORMAL LOW (ref 3.5–5.1)
Sodium: 145 mmol/L (ref 135–145)

## 2024-01-14 LAB — RESP PANEL BY RT-PCR (RSV, FLU A&B, COVID)  RVPGX2
Influenza A by PCR: NEGATIVE
Influenza B by PCR: NEGATIVE
Resp Syncytial Virus by PCR: NEGATIVE
SARS Coronavirus 2 by RT PCR: NEGATIVE

## 2024-01-14 LAB — TECHNOLOGIST SMEAR REVIEW: Plt Morphology: NORMAL

## 2024-01-14 LAB — CBG MONITORING, ED
Glucose-Capillary: 180 mg/dL — ABNORMAL HIGH (ref 70–99)
Glucose-Capillary: 137 mg/dL — ABNORMAL HIGH (ref 70–99)
Glucose-Capillary: 178 mg/dL — ABNORMAL HIGH (ref 70–99)

## 2024-01-14 LAB — GLUCOSE, CAPILLARY
Glucose-Capillary: 140 mg/dL — ABNORMAL HIGH (ref 70–99)
Glucose-Capillary: 159 mg/dL — ABNORMAL HIGH (ref 70–99)

## 2024-01-14 MED ORDER — ENSURE MAX PROTEIN PO LIQD
11.0000 [oz_av] | Freq: Two times a day (BID) | ORAL | Status: DC
  Administered 2024-01-14 – 2024-01-20 (×3): 11 [oz_av] via ORAL
  Filled 2024-01-14: qty 330

## 2024-01-14 MED ORDER — APIXABAN 5 MG PO TABS
5.0000 mg | ORAL_TABLET | Freq: Two times a day (BID) | ORAL | Status: DC
  Administered 2024-01-14 – 2024-01-17 (×7): 5 mg via ORAL
  Filled 2024-01-14 (×7): qty 1

## 2024-01-14 MED ORDER — ONDANSETRON HCL 4 MG PO TABS: 4.0000 mg | ORAL_TABLET | Freq: Four times a day (QID) | ORAL | Status: DC | PRN

## 2024-01-14 MED ORDER — LEVOTHYROXINE SODIUM 50 MCG PO TABS
50.0000 ug | ORAL_TABLET | Freq: Every day | ORAL | Status: DC
  Administered 2024-01-14 – 2024-01-20 (×7): 50 ug via ORAL
  Filled 2024-01-14 (×7): qty 1

## 2024-01-14 MED ORDER — DAPAGLIFLOZIN PROPANEDIOL 10 MG PO TABS
10.0000 mg | ORAL_TABLET | Freq: Every day | ORAL | Status: DC
  Administered 2024-01-14 – 2024-01-20 (×7): 10 mg via ORAL
  Filled 2024-01-14 (×7): qty 1

## 2024-01-14 MED ORDER — GABAPENTIN 100 MG PO CAPS
100.0000 mg | ORAL_CAPSULE | Freq: Every evening | ORAL | Status: DC | PRN
  Filled 2024-01-14: qty 1

## 2024-01-14 MED ORDER — FUROSEMIDE 10 MG/ML IJ SOLN: 40.0000 mg | Freq: Two times a day (BID) | INTRAMUSCULAR | Status: DC

## 2024-01-14 MED ORDER — PANTOPRAZOLE SODIUM 40 MG PO TBEC
40.0000 mg | DELAYED_RELEASE_TABLET | Freq: Every day | ORAL | Status: DC
  Administered 2024-01-14 – 2024-01-20 (×7): 40 mg via ORAL
  Filled 2024-01-14 (×7): qty 1

## 2024-01-14 MED ORDER — METOPROLOL SUCCINATE ER 50 MG PO TB24
50.0000 mg | ORAL_TABLET | Freq: Every day | ORAL | Status: DC
Start: 2024-01-14 — End: 2024-01-20
  Administered 2024-01-14 – 2024-01-20 (×7): 50 mg via ORAL
  Filled 2024-01-14 (×7): qty 1

## 2024-01-14 MED ORDER — FUROSEMIDE 10 MG/ML IJ SOLN
60.0000 mg | Freq: Two times a day (BID) | INTRAMUSCULAR | Status: DC
  Administered 2024-01-14 – 2024-01-18 (×8): 60 mg via INTRAVENOUS
  Filled 2024-01-14 (×8): qty 6

## 2024-01-14 MED ORDER — TRAZODONE HCL 50 MG PO TABS
25.0000 mg | ORAL_TABLET | Freq: Every evening | ORAL | Status: DC | PRN
  Administered 2024-01-14 – 2024-01-19 (×4): 25 mg via ORAL
  Filled 2024-01-14 (×4): qty 1

## 2024-01-14 MED ORDER — ACETAMINOPHEN 325 MG PO TABS
650.0000 mg | ORAL_TABLET | Freq: Four times a day (QID) | ORAL | Status: DC | PRN
  Administered 2024-01-17 – 2024-01-19 (×2): 650 mg via ORAL
  Filled 2024-01-14 (×2): qty 2

## 2024-01-14 MED ORDER — SENNOSIDES-DOCUSATE SODIUM 8.6-50 MG PO TABS: 1.0000 | ORAL_TABLET | Freq: Every evening | ORAL | Status: DC | PRN

## 2024-01-14 MED ORDER — MAGNESIUM HYDROXIDE 400 MG/5ML PO SUSP: 30.0000 mL | Freq: Every day | ORAL | Status: DC | PRN

## 2024-01-14 MED ORDER — INSULIN ASPART 100 UNIT/ML IJ SOLN
0.0000 [IU] | Freq: Every day | INTRAMUSCULAR | Status: DC
  Administered 2024-01-17: 2 [IU] via SUBCUTANEOUS
  Filled 2024-01-14: qty 1

## 2024-01-14 MED ORDER — ROSUVASTATIN CALCIUM 10 MG PO TABS
20.0000 mg | ORAL_TABLET | Freq: Every day | ORAL | Status: DC
  Administered 2024-01-14 – 2024-01-19 (×6): 20 mg via ORAL
  Filled 2024-01-14 (×6): qty 2

## 2024-01-14 MED ORDER — POTASSIUM CHLORIDE 20 MEQ PO PACK
40.0000 meq | PACK | Freq: Once | ORAL | Status: AC
  Administered 2024-01-14: 40 meq via ORAL
  Filled 2024-01-14: qty 2

## 2024-01-14 MED ORDER — ONDANSETRON HCL 4 MG/2ML IJ SOLN: 4.0000 mg | Freq: Four times a day (QID) | INTRAMUSCULAR | Status: DC | PRN

## 2024-01-14 MED ORDER — INSULIN ASPART 100 UNIT/ML IJ SOLN
0.0000 [IU] | Freq: Three times a day (TID) | INTRAMUSCULAR | Status: DC
  Administered 2024-01-14 (×2): 3 [IU] via SUBCUTANEOUS
  Administered 2024-01-14 – 2024-01-15 (×3): 2 [IU] via SUBCUTANEOUS
  Administered 2024-01-15: 3 [IU] via SUBCUTANEOUS
  Administered 2024-01-15: 2 [IU] via SUBCUTANEOUS
  Administered 2024-01-16: 3 [IU] via SUBCUTANEOUS
  Administered 2024-01-16: 2 [IU] via SUBCUTANEOUS
  Administered 2024-01-16: 3 [IU] via SUBCUTANEOUS
  Administered 2024-01-17 – 2024-01-18 (×3): 2 [IU] via SUBCUTANEOUS
  Administered 2024-01-18 (×2): 3 [IU] via SUBCUTANEOUS
  Administered 2024-01-19: 5 [IU] via SUBCUTANEOUS
  Administered 2024-01-19: 8 [IU] via SUBCUTANEOUS
  Administered 2024-01-19: 3 [IU] via SUBCUTANEOUS
  Administered 2024-01-20: 2 [IU] via SUBCUTANEOUS
  Filled 2024-01-14 (×19): qty 1

## 2024-01-14 MED ORDER — MELATONIN 5 MG PO TABS
5.0000 mg | ORAL_TABLET | Freq: Every day | ORAL | Status: DC
  Administered 2024-01-14 – 2024-01-19 (×6): 5 mg via ORAL
  Filled 2024-01-14 (×6): qty 1

## 2024-01-14 MED ORDER — POTASSIUM CHLORIDE CRYS ER 20 MEQ PO TBCR
40.0000 meq | EXTENDED_RELEASE_TABLET | ORAL | Status: AC
  Administered 2024-01-14 (×2): 40 meq via ORAL
  Filled 2024-01-14 (×2): qty 2

## 2024-01-14 MED ORDER — AMIODARONE HCL 200 MG PO TABS
100.0000 mg | ORAL_TABLET | Freq: Every day | ORAL | Status: DC
  Administered 2024-01-14 – 2024-01-20 (×7): 100 mg via ORAL
  Filled 2024-01-14 (×7): qty 1

## 2024-01-14 MED ORDER — ACETAMINOPHEN 650 MG RE SUPP: 650.0000 mg | Freq: Four times a day (QID) | RECTAL | Status: DC | PRN

## 2024-01-14 NOTE — Assessment & Plan Note (Signed)
-   This is likely prerenal due to acute CHF. - Will follow BMP with diuresis. - We will avoid nephrotoxins.

## 2024-01-14 NOTE — Assessment & Plan Note (Signed)
 -  We will continue statin therapy.

## 2024-01-14 NOTE — ED Notes (Signed)
 Pt moved form bed to recliner, no incident. Fall precautions maintained. CB in reach.

## 2024-01-14 NOTE — Assessment & Plan Note (Signed)
 -  We will continue PPI therapy

## 2024-01-14 NOTE — ED Notes (Signed)
 Pt given water reminded of the goal to remove excess fluids.

## 2024-01-14 NOTE — H&P (Addendum)
 [place]   PATIENT NAME: Jason [name]    MR#:  [id number]  DATE OF BIRTH:  [birth year]  DATE OF ADMISSION:  01/13/2024  PRIMARY CARE PHYSICIAN: Lonie Peak, PA-C   Patient is coming from: Home  REQUESTING/REFERRING PHYSICIAN: Loleta Rose, MD  CHIEF COMPLAINT:   Chief Complaint  Patient presents with   Shortness of Breath    HISTORY OF PRESENT ILLNESS:  Jason [name] is a 73 y.o. Caucasian male with medical history significant for diastolic CHF, coronary artery disease, type diabetes mellitus, essential hypertension, hypothyroidism, s/p AICD, P A-fib, who presented to the ER with acute onset of worsening dyspnea over the last 3 weeks with increasing lower extremity edema.  He admitted to orthopnea for the last couple weeks and has been sleeping in a recliner.  No cough or wheezing.  No chest pain or palpitations.  No fever or chills.  No nausea or vomiting or abdominal pain.  He has been compliant with his diuretic therapy.  No dysuria, oliguria or hematuria or flank pain.  He was seen earlier by his cardiologist during the day who noted he was in atrial fibrillation and advised him to come to the ED.  ED Course: When the patient came to the ER, were within normal.  Labs revealed hypokalemia of 2.9 with creatinine 1.39 above previous normal level and blood glucose was 155.  He is BNP was 264.5.  CBC showed anemia with hemoglobin 9.9 hematocrit 32.2 better than previous levels and thrombocytopenia 115 compared to 131 a month ago,  EKG as reviewed by me : EKG showed atrial fibrillation with controlled rate response of 67 with occasional ventricular paced complexes and left bundle branch block. Imaging: 2 view chest x-ray showed minimal bibasilar subsegmental atelectasis with small pleural effusions.  2D echo on 10/26/2023 revealed an EF of 50 to 55% with mild left ventricular hypertrophy and indeterminate left ventricular diastolic parameters.  The patient was given 80 mg  of IV Lasix and 40 mill equivalent p.o. potassium chloride.  He will be admitted to a cardiac telemetry bed for further evaluation and management. PAST MEDICAL HISTORY:   Past Medical History:  Diagnosis Date   AICD (automatic cardioverter/defibrillator) present    [employer]   Arthritis    CHF (congestive heart failure) (HCC)    Coronary artery disease involving native coronary artery with unstable angina pectoris (HCC) 04/13/2016   Coronary artery disease with history of myocardial infarction without history of [name]    Dr. 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 [place]. Dr. Elesa Hacker x2   CARDIAC CATHETERIZATION Left 04/13/2016   Procedure: Left Heart Cath and Coronary Angiography;  Surgeon: [name], MD;  Location: [facility] INVASIVE CV LAB;  Service: Cardiovascular;  Laterality: Left;   CARDIOVERSION N/A 08/02/2018   Procedure: CARDIOVERSION;  Surgeon: [name], MD;  Location: [facility] ORS;  Service: Cardiovascular;  Laterality: N/A;   CATARACT EXTRACTION W/PHACO Right 01/23/2016   Procedure: CATARACT EXTRACTION PHACO AND INTRAOCULAR LENS PLACEMENT (IOC);  Surgeon: Galen Manila, MD;  Location: [facility] ORS;  Service: Ophthalmology;  Laterality: Right;  [place] 01:18 AP% 23.8 CDE 18.67 fluid pack lot # [id number]   CHOLECYSTECTOMY     CORONARY ARTERY BYPASS GRAFT N/A 04/14/2016   Procedure:  CORONARY ARTERY BYPASS GRAFTING TIMES FOUR    USING LEFT INTERNAL MAMMARY AND ENDOSCOPIC HARVEST RIGHT SAPHENOUS VEIN;  Surgeon: Purcell Nails, MD;  Location: [facility] OR;  Service: Open Heart Surgery;  Laterality: N/A;   EYE SURGERY Bilateral    Cataract Extraction with IOL implant.   INTRAOPERATIVE TRANSESOPHAGEAL ECHOCARDIOGRAM N/A 04/14/2016   Procedure: INTRAOPERATIVE TRANSESOPHAGEAL ECHOCARDIOGRAM;  Surgeon: Purcell Nails, MD;  Location: [facility] OR;  Service: Open Heart Surgery;  Laterality: N/A;   KNEE ARTHROSCOPY Left 10/08/2015   Procedure: ARTHROSCOPY KNEE, PARTIAL MEDIAL AND LATERAL MENISECTOMY, REMOVAL OF FOREIGN BODY;  Surgeon: Kennedy Bucker, MD;  Location: [facility] ORS;  Service: Orthopedics;  Laterality: Left;   LUMBAR LAMINECTOMY/DECOMPRESSION MICRODISCECTOMY Bilateral 10/18/2023   Procedure: Lumbar Three-Four, Lumbar Four-Five  Laminotomy, Foraminotomy;  Surgeon: Tressie Stalker, MD;  Location[facility];  Service: Neurosurgery;  Laterality: Bilateral;  3C    SOCIAL HISTORY:   Social History   Tobacco Use   Smoking status: Never   Smokeless tobacco: Current    Types: Chew   Tobacco comments:    Daily chew  Substance Use Topics   Alcohol use: No    FAMILY HISTORY:   Family History  Problem Relation Age of Onset   Hypertension Father    Aortic stenosis Father    Heart disease Father     DRUG ALLERGIES:   Allergies  Allergen Reactions   Aspirin Anaphylaxis   Inderal [Propranolol]     Other reaction(s): Other (See Comments) Fatigue   Metformin Hcl Diarrhea    Pt still takes medication   Mevacor [Lovastatin] Other (See Comments)    muscle Pain   Penicillin G Hives   Pravachol [Pravastatin Sodium] Other (See Comments)    Muscle Pain   Zocor [Simvastatin] Other (See Comments)    Muscle Pain   Penicillins Rash    Has patient had a PCN reaction causing immediate rash, facial/tongue/throat swelling, SOB or lightheadedness with hypotension: Yes Has patient had a PCN  reaction causing severe rash involving mucus membranes or skin necrosis: Yes Has patient had a PCN reaction that required hospitalization No Has patient had a PCN reaction occurring within the last 10 years: Yes If all of the above answers are "NO", then may proceed with Cephalosporin use.     REVIEW OF SYSTEMS:   ROS As per history of present illness. All pertinent systems were reviewed above. Constitutional, HEENT, cardiovascular, respiratory, GI, GU, musculoskeletal, neuro, psychiatric, endocrine, integumentary and hematologic systems were reviewed and are otherwise negative/unremarkable except for positive findings mentioned above in the HPI.   MEDICATIONS AT HOME:   Prior to Admission medications   Medication Sig Start Date End Date Taking? Authorizing Provider  potassium chloride SA (KLOR-CON M) 20 MEQ tablet Take 1 tablet (20 mEq total) by mouth daily for 5 days. 01/13/24 01/18/24 Yes Chesley Noon, MD  acetaminophen (TYLENOL) 500 MG tablet Take 500-1,000 mg by mouth every 6 (six) hours as needed (pain.).    [provider]  amiodarone (PACERONE) 200 MG tablet Take 100 mg by mouth in the morning.    [provider]  apixaban (ELIQUIS) 5 MG TABS tablet Take 1 tablet (5 mg total) by mouth 2 (two) times daily. 10/25/23   Patrici Ranks Caylin, PA-C  dapagliflozin propanediol (FARXIGA) 10 MG TABS tablet Take 10 mg by mouth in the morning.    [provider]  Ensure Max Protein (ENSURE MAX PROTEIN) LIQD Take 330 mLs (11 oz total) by mouth 2 (two) times daily. 11/04/23   Marolyn Haller, MD  gabapentin (NEURONTIN) 100 MG capsule Take 100 mg by mouth at bedtime as needed (nerve pain.). 06/30/23   [provider]  levothyroxine (SYNTHROID) 50 MCG tablet Take 50 mcg by mouth daily at 2 am. 08/08/23   [provider]  melatonin 5 MG TABS Take 1 tablet (5 mg total) by mouth at bedtime. 11/04/23   Marolyn Haller, MD  metFORMIN (GLUCOPHAGE-XR) 500 MG  24 hr tablet Take 1 tablet (500 mg total) by mouth 2 (two) times daily with a meal. 11/04/23   Marolyn Haller, MD  metoprolol succinate (TOPROL-XL) 50 MG 24 hr tablet Take 1 tablet (50 mg total) by mouth in the morning. Take with or immediately following a meal. 12/12/23 12/11/24  Gillis Santa, MD  Nystatin (GERHARDT'S BUTT CREAM) CREA Apply 1 Application topically 2 (two) times daily. 11/04/23   Marolyn Haller, MD  pantoprazole (PROTONIX) 40 MG tablet Take 1 tablet (40 mg total) by mouth daily. 11/05/23   Marolyn Haller, MD  rosuvastatin (CRESTOR) 40 MG tablet Take 20 mg by mouth at bedtime. 07/01/23   [provider]  sacubitril-valsartan (ENTRESTO) 49-51 MG Take 1 tablet by mouth 2 (two) times daily.    [provider]  senna-docusate (SENOKOT-S) 8.6-50 MG tablet Take 1 tablet by mouth at bedtime as needed for moderate constipation. 11/04/23   Marolyn Haller, MD  torsemide (DEMADEX) 20 MG tablet Take 20 mg by mouth in the morning. 11/23/22   [provider]      VITAL SIGNS:  Blood pressure (!) 131/59, pulse 83, temperature 98.1 F (36.7 C), temperature source Oral, resp. rate 20, height 6\' 4"  (1.93 m), weight 98.7 kg, SpO2 97%.  PHYSICAL EXAMINATION:  Physical Exam  GENERAL:  73 y.o.-year-old Caucasian male patient lying in the bed with no acute distress.  EYES: Pupils equal, round, reactive to light and accommodation. No scleral icterus. Extraocular muscles intact.  HEENT: Head atraumatic, normocephalic. Oropharynx and nasopharynx clear.  NECK:  Supple, no jugular venous distention. No thyroid enlargement, no tenderness.  LUNGS: Diminished bibasal breath sounds with bibasilar rales.. No use of accessory muscles of respiration.  CARDIOVASCULAR: Irregularly irregular rhythm, S1, S2 normal. No murmurs, rubs, or gallops.  ABDOMEN: Soft, nondistended, nontender. Bowel sounds present. No organomegaly or mass.  EXTREMITIES: 1+ to 2+ bilateral lower extremity pitting  edema with no cyanosis, or clubbing.  NEUROLOGIC: Cranial nerves II through XII are intact. Muscle strength 5/5 in all extremities. Sensation intact. Gait not checked.  PSYCHIATRIC: The patient is alert and oriented x 3.  Normal affect and good eye contact. SKIN: No obvious rash, lesion, or ulcer.   LABORATORY PANEL:   CBC Recent Labs  Lab 01/13/24 1453  WBC 4.7  HGB 9.9*  HCT 32.2*  PLT 115*   ------------------------------------------------------------------------------------------------------------------  Chemistries  Recent Labs  Lab 01/13/24 1453  NA 139  K 2.9*  CL 100  CO2 32  GLUCOSE 155*  BUN 18  CREATININE 1.39*  CALCIUM 9.0  MG 2.1   ------------------------------------------------------------------------------------------------------------------  Cardiac Enzymes No results for input(s): "TROPONINI" in the last 168 hours. ------------------------------------------------------------------------------------------------------------------  RADIOLOGY:  DG Chest 2 View Result Date: 01/13/2024 CLINICAL DATA:  Shortness of breath. EXAM: CHEST - 2 VIEW COMPARISON:  October 24, 2023. FINDINGS: Stable cardiomediastinal silhouette. Left-sided defibrillator is unchanged. Status post coronary artery bypass graft. Minimal bibasilar subsegmental atelectasis is noted with small pleural effusions. Bony thorax is unremarkable. IMPRESSION: Minimal bibasilar subsegmental atelectasis is noted with small pleural effusions. Electronically Signed   By: Lupita Raider M.D.   On: 01/13/2024 16:07      IMPRESSION AND PLAN:  Assessment and Plan: * Acute on chronic diastolic CHF (congestive heart failure) (HCC)  -The patient will be admitted to a cardiac telemetry bed. - We will continue diuresis with IV Lasix to replace Demadex.. - We will continue Cl[place] Custard and Toprol-XL. - We Will follow serial troponins. - We will follow I's and O's and daily weights. - Cardiology  consult be obtained. - I notified Dr. Darrold Junker about the patient.   Hypokalemia - Potassium will be replaced and magnesium level will be checked.  AKI (acute kidney injury) (HCC) - This is likely prerenal due to acute CHF. - Will follow BMP with diuresis. - We will avoid nephrotoxins.  Atrial fibrillation, chronic (HCC) - We will continue amiodarone, Toprol-XL and Eliquis.  Type 2 diabetes mellitus with peripheral neuropathy (HCC) - We will place the patient on supplemental coverage with NovoLog. - We will continue Neurontin.  GERD without esophagitis - We will continue PPI therapy.  Dyslipidemia - We will continue statin therapy.     DVT prophylaxis: Eliquis Advanced Care Planning:  Code Status: full code. Family Communication:  The plan of care was discussed in details with the patient (and family). I answered all questions. The patient agreed to proceed with the above mentioned plan. Further management will depend upon hospital course. Disposition Plan: Back to previous home environment Consults called: Cardiology All the records are reviewed and case discussed with ED provider.  Status is: Inpatient    At the time of the admission, it appears that the appropriate admission status for this patient is inpatient.  This is judged to be reasonable and necessary in order to provide the required intensity of service to ensure the patient's safety given the presenting symptoms, physical exam findings and initial radiographic and laboratory data in the context of comorbid conditions.  The patient requires inpatient status due to high intensity of service, high risk of further deterioration and high frequency of surveillance required.  I certify that at the time of admission, it is my clinical judgment that the patient will require inpatient hospital care extending more than 2 midnights.                            Dispo: The patient is from: Home              Anticipated d/c is  to: Home              Patient currently is not medically stable to d/c.              Difficult to place patient: No  Hannah Beat M.D on 01/14/2024 at 5:04 AM  [facility]   From 7 PM-7 AM, contact night-coverage [email or web address]  CC: Primary care physician; Lonie Peak, PA-C

## 2024-01-14 NOTE — Assessment & Plan Note (Signed)
-   We will place the patient on supplemental coverage with NovoLog. - We will continue Neurontin.

## 2024-01-14 NOTE — ED Notes (Signed)
 Pt able to stand with assistance and ambulate using a walker without assistance. Pt ambulated the length of the flex dept and back to his room. O2 sat dropped from 96%(while seated) to 89%  while ambulating. O2 returned to 96% upon returning to a sitting position. Provider notified.

## 2024-01-14 NOTE — Assessment & Plan Note (Addendum)
-  The patient will be admitted to a cardiac telemetry bed. - We will continue diuresis with IV Lasix to replace Demadex.. - We will continue Clifton Custard and Toprol-XL. - We Will follow serial troponins. - We will follow I's and O's and daily weights. - Cardiology consult be obtained. - I notified Dr. Darrold Junker about the patient.

## 2024-01-14 NOTE — Progress Notes (Addendum)
 Interim progress note not for billing  Admitted earlier this morning by my colleague. I have seen and examined the patient, reviewed the chart, and agree with the assessment and plan ounless otherwise stipulated. Hx hfrecoveredef, a fib, dm, cad, has aicd, hx cabg x4, htn, hypothyroid, several recent hospitalizations, coming from home, presenting with 2 weeks worsening lower extremity edema with orthopnea. Clinically quite volume overloaded, stated on IV lasix. Had negative dvt eval a few months ago with similar presentation, will hold on PVLs. Cardiology has been consulted, had tte a few months ago so will defer decision regarding repeat imaging to them. Will continue lasix, also check respiratory swab given patient complaint of mild cough. Erythema of lower extremities suspect this is 2/2 fluid but if fails to improve with diuresis may need antibiotics. Also note recent thrombocytopenia. Negative hcv earlier this year, will check hiv and peripheral smear.

## 2024-01-14 NOTE — ED Notes (Signed)
 Pt ambulated from recliner to bed, no incident. CB in reach and fall precautions maintained.

## 2024-01-14 NOTE — Assessment & Plan Note (Addendum)
-   We will continue amiodarone, Toprol-XL and Eliquis.

## 2024-01-14 NOTE — Assessment & Plan Note (Signed)
-   Potassium will be replaced and magnesium level will be checked. ?

## 2024-01-14 NOTE — Progress Notes (Signed)
 Heart Failure Navigator Progress Note  Assessed for Heart & Vascular TOC clinic readiness.  Patient does not meet criteria due to current [facility] patient of Dr. Marcina Millard, MD.   Navigator will sign off at this time.  Roxy Horseman, RN, BSN [facility] Heart Failure Navigator Secure Chat Only

## 2024-01-14 NOTE — Consult Note (Signed)
 [facility] Cardiology  CARDIOLOGY CONSULT NOTE  Patient ID: Jason [name] MRN: [id number] DOB/AGE: 1951-04-02 73 y.o.  Admit date: 01/13/2024 Referring Physician [facility] Primary Physician [facility] Primary Cardiologist Alluri Reason for Consultation congestive heart failure  HPI: 73 year old gentleman referred for evaluation of acute on chronic HFpEF.  Patient has known coronary artery disease, status post [name] ,04/14/2016 [facility].  He has history of systolic congestive heart failure, and has undergone ICD placement at [facility].  The patient also has a history of paroxysmal atrial fibrillation, on Eliquis for stroke prevention, and amiodarone for rate and rhythm control.  Reports a 1 to 42-month history of worsening shortness of breath and peripheral edema.  He was seen by Dr. Corky Sing on 01/13/2024 and was sent to [facility] ED for evaluation and treatment of acute on chronic HFpEF.  2D echocardiogram 10/26/2023 revealed preserved left ventricular function, with LVEF 50-55%.  ECG reveals atrial fibrillation with left bundle branch block at 67 bpm.  He is tentatively scheduled for TEE guided electrical cardioversion on 01/17/2024.  Review of systems complete and found to be negative unless listed above     Past Medical History:  Diagnosis Date   AICD (automatic cardioverter/defibrillator) present    [employer]   Arthritis    CHF (congestive heart failure) (HCC)    Coronary artery disease involving native coronary artery with unstable angina pectoris (HCC) 04/13/2016   Coronary artery disease with history of myocardial infarction without history of [name]    Dr. Barry Dienes   Cough    CHRONIC   Diabetes mellitus without complication (HCC)    Edema    FEET/LEGS   Essential hypertension    GERD without esophagitis 01/14/2024   Hypertension    Hypothyroidism    Left main coronary artery disease 04/13/2016   Mixed hyperlipidemia    Obesity    PAF (paroxysmal atrial fibrillation) (HCC)    S/P [name]  04/14/2016   LIMA to LAD, SVG to D1, SVG to OM, SVG to PDA, EVH via right thigh and leg   Type II diabetes mellitus (HCC)    Unstable angina (HCC) 04/13/2016    Past Surgical History:  Procedure Laterality Date   BACK SURGERY     Lumbar Surgeries. [place]. Dr. Elesa Hacker x2   CARDIAC CATHETERIZATION Left 04/13/2016   Procedure: Left Heart Cath and Coronary Angiography;  Surgeon: [name], MD;  Location: [facility] INVASIVE CV LAB;  Service: Cardiovascular;  Laterality: Left;   CARDIOVERSION N/A 08/02/2018   Procedure: CARDIOVERSION;  Surgeon: [name], MD;  Location: [facility] ORS;  Service: Cardiovascular;  Laterality: N/A;   CATARACT EXTRACTION W/PHACO Right 01/23/2016   Procedure: CATARACT EXTRACTION PHACO AND INTRAOCULAR LENS PLACEMENT (IOC);  Surgeon: Galen Manila, MD;  Location: [facility] ORS;  Service: Ophthalmology;  Laterality: Right;  [place] 01:18 AP% 23.8 CDE 18.67 fluid pack lot # [id number]   CHOLECYSTECTOMY     CORONARY ARTERY BYPASS GRAFT N/A 04/14/2016   Procedure: CORONARY ARTERY BYPASS GRAFTING TIMES FOUR    USING LEFT INTERNAL MAMMARY AND ENDOSCOPIC HARVEST RIGHT SAPHENOUS VEIN;  Surgeon: Purcell Nails, MD;  Location: [facility] OR;  Service: Open Heart Surgery;  Laterality: N/A;   EYE SURGERY Bilateral    Cataract Extraction with IOL implant.   INTRAOPERATIVE TRANSESOPHAGEAL ECHOCARDIOGRAM N/A 04/14/2016   Procedure: INTRAOPERATIVE TRANSESOPHAGEAL ECHOCARDIOGRAM;  Surgeon: Purcell Nails, MD;  Location: [facility] OR;  Service: Open Heart Surgery;  Laterality: N/A;   KNEE ARTHROSCOPY Left 10/08/2015  Procedure: ARTHROSCOPY KNEE, PARTIAL MEDIAL AND LATERAL MENISECTOMY, REMOVAL OF FOREIGN BODY;  Surgeon: Kennedy Bucker, MD;  Location: [facility] ORS;  Service: Orthopedics;  Laterality: Left;   LUMBAR LAMINECTOMY/DECOMPRESSION MICRODISCECTOMY Bilateral 10/18/2023   Procedure: Lumbar Three-Four, Lumbar Four-Five  Laminotomy, Foraminotomy;  Surgeon: Tressie Stalker, MD;  Location:  [facility] OR;  Service: Neurosurgery;  Laterality: Bilateral;  3C    (Not in a hospital admission)  Social History   Socioeconomic History   Marital status: Married    Spouse name: Katie   Number of children: 0   Years of education: Not on file   Highest education level: Not on file  Occupational History   Not on file  Tobacco Use   Smoking status: Never   Smokeless tobacco: Current    Types: Chew   Tobacco comments:    Daily chew  Vaping Use   Vaping status: Never Used  Substance and Sexual Activity   Alcohol use: No   Drug use: No   Sexual activity: Not Currently  Other Topics Concern   Not on file  Social History Narrative   Not on file   Social Drivers of Health   Financial Resource Strain: Low Risk  (08/02/2018)   Overall Financial Resource Strain (CARDIA)    Difficulty of Paying Living Expenses: Not very hard  Food Insecurity: No Food Insecurity (12/10/2023)   Hunger Vital Sign    Worried About Running Out of Food in the Last Year: Never true    Ran Out of Food in the Last Year: Never true  Transportation Needs: No Transportation Needs (12/10/2023)   PRAPARE - Administrator, Civil Service (Medical): No    Lack of Transportation (Non-Medical): No  Physical Activity: Unknown (08/02/2018)   Exercise Vital Sign    Days of Exercise per Week: 0 days    Minutes of Exercise per Session: Not on file  Stress: No Stress Concern Present (08/02/2018)   [employer] of Occupational Health - Occupational Stress Questionnaire    Feeling of Stress : Only a little  Social Connections: Socially Integrated (12/10/2023)   Social Connection and Isolation Panel [NHANES]    Frequency of Communication with Friends and Family: More than three times a week    Frequency of Social Gatherings with Friends and Family: More than three times a week    Attends Religious Services: More than 4 times per year    Active Member of [employer] or Organizations: Yes    Attends Museum/gallery exhibitions officer: More than 4 times per year    Marital Status: Married  Catering manager Violence: Not At Risk (12/10/2023)   Humiliation, Afraid, Rape, and Kick questionnaire    Fear of Current or Ex-Partner: No    Emotionally Abused: No    Physically Abused: No    Sexually Abused: No    Family History  Problem Relation Age of Onset   Hypertension Father    Aortic stenosis Father    Heart disease Father       Review of systems complete and found to be negative unless listed above      PHYSICAL EXAM  General: Well developed, well nourished, in no acute distress HEENT:  Normocephalic and atramatic Neck:  No JVD.  Lungs: Clear bilaterally to auscultation and percussion. Heart: HRRR . Normal S1 and S2 without gallops or murmurs.  Abdomen: Bowel sounds are positive, abdomen soft and non-tender  Msk:  Back normal, normal gait. Normal strength and tone for age. Extremities:  No clubbing, cyanosis or edema.   Neuro: Alert and oriented X 3. Psych:  Good affect, responds appropriately  Labs:   Lab Results  Component Value Date   WBC 4.2 01/14/2024   WBC 4.2 01/14/2024   HGB 10.0 (L) 01/14/2024   HGB 10.1 (L) 01/14/2024   HCT 32.3 (L) 01/14/2024   HCT 33.5 (L) 01/14/2024   MCV 90.5 01/14/2024   MCV 93.3 01/14/2024   PLT 103 (L) 01/14/2024   PLT 102 (L) 01/14/2024    Recent Labs  Lab 01/14/24 0500  NA 145  K 3.2*  CL 103  CO2 36*  BUN 17  CREATININE 1.27*  CALCIUM 9.1  GLUCOSE 182*   Lab Results  Component Value Date   CKTOTAL 102 12/09/2023   TROPONINI 0.05 (H) 04/14/2016    Lab Results  Component Value Date   CHOL 191 04/13/2016   Lab Results  Component Value Date   HDL 25 (L) 04/13/2016   Lab Results  Component Value Date   LDLCALC 127 (H) 04/13/2016   Lab Results  Component Value Date   TRIG 135 04/23/2016   TRIG 193 (H) 04/13/2016   Lab Results  Component Value Date   CHOLHDL 7.6 04/13/2016   No results found for: "LDLDIRECT"     Radiology: DG Chest 2 View Result Date: 01/13/2024 CLINICAL DATA:  Shortness of breath. EXAM: CHEST - 2 VIEW COMPARISON:  October 24, 2023. FINDINGS: Stable cardiomediastinal silhouette. Left-sided defibrillator is unchanged. Status post coronary artery bypass graft. Minimal bibasilar subsegmental atelectasis is noted with small pleural effusions. Bony thorax is unremarkable. IMPRESSION: Minimal bibasilar subsegmental atelectasis is noted with small pleural effusions. Electronically Signed   By: Lupita Raider M.D.   On: 01/13/2024 16:07    EKG: Atrial fibrillation, left bundle branch block, 67 bpm  ASSESSMENT AND PLAN:   1.  Acute on chronic HFpEF, with shortness of breath and 2+ pitting bilateral peripheral edema, x 1 to 2 months, BNP 264.5.  2D echocardiogram 10/26/2023 revealed recovery of LV function, EF 50-55%. 2.  Coronary artery disease, status post [name] times 46/20/2017, without chest pain, troponin normal (10). 3.  Paroxysmal atrial fibrillation, on Eliquis for stroke prevention, and amiodarone for rate and rhythm control  Recommendations  1.  Agree with current therapy 2.  Continue Eliquis for stroke prevention 3.  Continue diuresis, furosemide 60 mg IV every 12 4.  Carefully monitor renal status 5.  Plan for TEE/DCCV on 01/17/2024 per Dr. Corky Sing  Signed: Marcina Millard MD,PhD, [facility] 01/14/2024, 8:49 AM

## 2024-01-14 NOTE — Plan of Care (Signed)

## 2024-01-15 DIAGNOSIS — I5033 Acute on chronic diastolic (congestive) heart failure: Secondary | ICD-10-CM | POA: Diagnosis not present

## 2024-01-15 LAB — BASIC METABOLIC PANEL
Anion gap: 8 (ref 5–15)
BUN: 18 mg/dL (ref 8–23)
CO2: 31 mmol/L (ref 22–32)
Calcium: 9.4 mg/dL (ref 8.9–10.3)
Chloride: 103 mmol/L (ref 98–111)
Creatinine, Ser: 1.34 mg/dL — ABNORMAL HIGH (ref 0.61–1.24)
GFR, Estimated: 56 mL/min — ABNORMAL LOW (ref >60)
Glucose, Bld: 137 mg/dL — ABNORMAL HIGH (ref 70–99)
Potassium: 3.7 mmol/L (ref 3.5–5.1)
Sodium: 142 mmol/L (ref 135–145)

## 2024-01-15 LAB — CBC
HCT: 32 % — ABNORMAL LOW (ref 39.0–52.0)
Hemoglobin: 9.7 g/dL — ABNORMAL LOW (ref 13.0–17.0)
MCH: 28.2 pg (ref 26.0–34.0)
MCHC: 30.3 g/dL (ref 30.0–36.0)
MCV: 93 fL (ref 80.0–100.0)
Platelets: 90 10*3/uL — ABNORMAL LOW (ref 150–400)
RBC: 3.44 MIL/uL — ABNORMAL LOW (ref 4.22–5.81)
RDW: 14.6 % (ref 11.5–15.5)
WBC: 4.4 10*3/uL (ref 4.0–10.5)
nRBC: 0 % (ref 0.0–0.2)

## 2024-01-15 LAB — GLUCOSE, CAPILLARY
Glucose-Capillary: 145 mg/dL — ABNORMAL HIGH (ref 70–99)
Glucose-Capillary: 159 mg/dL — ABNORMAL HIGH (ref 70–99)
Glucose-Capillary: 131 mg/dL — ABNORMAL HIGH (ref 70–99)
Glucose-Capillary: 163 mg/dL — ABNORMAL HIGH (ref 70–99)

## 2024-01-15 LAB — MAGNESIUM: Magnesium: 2 mg/dL (ref 1.7–2.4)

## 2024-01-15 LAB — HIV ANTIBODY (ROUTINE TESTING W REFLEX): HIV Screen 4th Generation wRfx: NONREACTIVE

## 2024-01-15 NOTE — Evaluation (Signed)
 Occupational Therapy Evaluation Patient Details Name: Jason [name] MRN: [id number] DOB: 07/03/1951 Today's Date: 01/15/2024   History of Present Illness   Pt is a 73 year old male presented to the ER with acute onset of worsening dyspnea over the last 3 weeks with increasing lower extremity edema, admitted with Acute on chronic diastolic CHF (congestive heart failure), hypokalemia, AKI, afib,     PMH significant for diastolic CHF, coronary artery disease, type diabetes mellitus, essential hypertension, hypothyroidism, s/p AICD, P A-fib     Clinical Impressions Chart reviewed, pt greeted sitting on edge of bed, agreeable to OT evaluation. Pt is alert and oriented x4. PTA Pt reports he is generally MOD I with ADL (with increased time and equipment), assist for IADL but does prep simple meals, cleans up house and assists wife (who requires assist as well). Pt amb household distances with RW. Pt presents with deficits in activity tolerance, balance affecting safe and optimal ADL completion. STS completed with CGA-MIN A (increased assist from low bed height), amb in room with RW with CGA, toileting with CGA (standing to urinate), CGA for standing grooming tasks. SET UP required for UB dressing, CGA for LB dressing. Pt will benefit from acute OT to address functional deficits and to facilitate optimal ADL performance. Pt is left in care of PT, all needs met. OT will follow acutely.      If plan is discharge home, recommend the following:   A little help with walking and/or transfers;A little help with bathing/dressing/bathroom;Help with stairs or ramp for entrance;Assistance with cooking/housework;Assist for transportation     Functional Status Assessment   Patient has had a recent decline in their functional status and demonstrates the ability to make significant improvements in function in a reasonable and predictable amount of time.     Equipment Recommendations   None recommended by  OT;Other (comment) (pt has recommended equipment)     Recommendations for Other Services         Precautions/Restrictions   Precautions Precautions: Fall Recall of Precautions/Restrictions: Intact Restrictions Weight Bearing Restrictions Per Provider Order: No     Mobility Bed Mobility               General bed mobility comments: NT but pt was sitting on edge of bed pre session (had gotten to edge of bed by himself)    Transfers Overall transfer level: Needs assistance Equipment used: Rolling walker (2 wheels) Transfers: Sit to/from Stand Sit to Stand: Contact guard assist, Min assist           General transfer comment: MIN A from lower surfaces      Balance Overall balance assessment: Needs assistance Sitting-balance support: Feet supported Sitting balance-Leahy Scale: Good     Standing balance support: Bilateral upper extremity supported, During functional activity, Reliant on assistive device for balance Standing balance-Leahy Scale: Fair                             ADL either performed or assessed with clinical judgement   ADL Overall ADL's : Needs assistance/impaired Eating/Feeding: Set up;Sitting   Grooming: Wash/dry hands;Standing;Contact guard assist Grooming Details (indicate cue type and reason): with Rw at sink level         Upper Body Dressing : Set up;Sitting Upper Body Dressing Details (indicate cue type and reason): doff/donn gown Lower Body Dressing: Contact guard assist;Sitting/lateral leans Lower Body Dressing Details (indicate cue type and reason): socks Toilet  Transfer: Contact guard assist;Rolling walker (2 wheels);Ambulation Toilet Transfer Details (indicate cue type and reason): MIN A for STS from low heights (pt has raised bed/toilet/recliner at home) Toileting- Architect and Hygiene: Supervision/safety;Sit to/from stand Toileting - Clothing Manipulation Details (indicate cue type and reason):  urinate in toilet, approrpiate AD use     Functional mobility during ADLs: Contact guard assist;Rolling walker (2 wheels) (household distances, hand off to PT for amb out of room)       Vision Patient Visual Report: No change from baseline       Perception         Praxis         Pertinent Vitals/Pain Pain Assessment Pain Assessment: No/denies pain     Extremity/Trunk Assessment Upper Extremity Assessment Upper Extremity Assessment: Overall WFL for tasks assessed   Lower Extremity Assessment Lower Extremity Assessment: Defer to PT evaluation       Communication Communication Communication: No apparent difficulties   Cognition Arousal: Alert Behavior During Therapy: WFL for tasks assessed/performed Cognition: No apparent impairments                               Following commands: Intact       Cueing  General Comments   Cueing Techniques: Verbal cues  vss throughout   Exercises Other Exercises Other Exercises: edu re: role of OT, role of rehab, discharge recommendations   Shoulder Instructions      Home Living Family/patient expects to be discharged to:: Private residence Living Arrangements: Spouse/significant other Available Help at Discharge: Family;Friend(s);Available PRN/intermittently Type of Home: House Home Access: Stairs to enter [employer] of Steps: 5 Entrance Stairs-Rails: Right;Left Home Layout: One level     Bathroom Shower/Tub: Chief Strategy Officer: Handicapped height (with bars)     Home Equipment: Agricultural consultant (2 wheels);Tub bench          Prior Functioning/Environment               Mobility Comments: amb with rolling walker household distances, working with HHPT ADLs Comments: MOD I for feeding, grooming, dressing, bathing (with tub bench); assist for IADLs (does not drive) but will prep simple meals, simple clean up around house, manages own meds; help for IADLs from friends  from church    OT Problem List: Decreased activity tolerance;Decreased knowledge of use of DME or AE;Impaired balance (sitting and/or standing)   OT Treatment/Interventions: Self-care/ADL training;DME and/or AE instruction;Therapeutic activities;Balance training;Therapeutic exercise;Energy conservation;Patient/family education      OT Goals(Current goals can be found in the care plan section)   Acute Rehab OT Goals Patient Stated Goal: go home OT Goal Formulation: With patient Time For Goal Achievement: 01/29/24 Potential to Achieve Goals: Good ADL Goals Pt Will Perform Grooming: with modified independence;sitting;standing Pt Will Perform Lower Body Dressing: with modified independence;sitting/lateral leans;sit to/from stand Pt Will Transfer to Toilet: with modified independence;ambulating Pt Will Perform Toileting - Clothing Manipulation and hygiene: with modified independence;sitting/lateral leans;sit to/from stand   OT Frequency:  Min 2X/week    Co-evaluation PT/OT/SLP Co-Evaluation/Treatment:  (hand off)            AM-PAC OT "6 Clicks" Daily Activity     Outcome Measure Help from another person eating meals?: None Help from another person taking care of personal grooming?: None Help from another person toileting, which includes using toliet, bedpan, or urinal?: None Help from another person bathing (including washing, rinsing, drying)?:  A Little Help from another person to put on and taking off regular upper body clothing?: None Help from another person to put on and taking off regular lower body clothing?: A Little 6 Click Score: 22   End of Session Equipment Utilized During Treatment: Rolling walker (2 wheels);Gait belt Nurse Communication: Mobility status  Activity Tolerance: Patient tolerated treatment well Patient left: Other (comment) (in care of PT, amb in hallway)  OT Visit Diagnosis: Other abnormalities of gait and mobility (R26.89)                Time:  1610-9604 OT Time Calculation (min): 22 min Charges:  OT General Charges $OT Visit: 1 Visit OT Evaluation $OT Eval Moderate Complexity: 1 Mod  Oleta Mouse, OTD OTR/L  01/15/24, 1:45 PM

## 2024-01-15 NOTE — Plan of Care (Signed)

## 2024-01-15 NOTE — Evaluation (Signed)
 Physical Therapy Evaluation Patient Details Name: Jason [name] MRN: [id number] DOB: 1951-01-17 Today's Date: 01/15/2024  History of Present Illness  Pt is a 73 year old male presented to the ER with acute onset of worsening dyspnea over the last 3 weeks with increasing lower extremity edema, admitted with Acute on chronic diastolic CHF (congestive heart failure), hypokalemia, AKI, afib,     PMH significant for diastolic CHF, coronary artery disease, type diabetes mellitus, essential hypertension, hypothyroidism, s/p AICD, P A-fib  Clinical Impression  Patient up in bathroom with OT upon arrival to room; alert and oriented, follows commands and agreeable to progressive gait distance with PT.  Denies pain; does endorse improvement in respiratory status since admission.  Bilat UE/LE strength and ROM grossly symmetrical and WFL for transfers and gait; no focal weakness, no radicular symptoms reported.  Did note +2-3 pitting edema knees distally bilat. Able to complete sit/stand, basic transfers and gait (200') with RW, cga.  Demonstrates forward flexed posture, reciprocal stepping pattern with fair step height/length bilat; mod WBing bilat UEs. Endorses "catch" in L hip with progressive distance, but no overt buckling or LOB throughout distance. Mild SOB with gait efforts, sats >93% on RA. Would benefit from skilled PT to address above deficits and promote optimal return to PLOF.; recommend post-acute PT follow up as indicated by interdisciplinary care team.            If plan is discharge home, recommend the following: A little help with walking and/or transfers;A little help with bathing/dressing/bathroom   Can travel by private vehicle        Equipment Recommendations None recommended by PT  Recommendations for Other Services       Functional Status Assessment Patient has had a recent decline in their functional status and demonstrates the ability to make significant improvements in function  in a reasonable and predictable amount of time.     Precautions / Restrictions Precautions Precautions: Fall Recall of Precautions/Restrictions: Intact Restrictions Weight Bearing Restrictions Per Provider Order: No      Mobility  Bed Mobility               General bed mobility comments: Up in bathroom with OT upon arrival to room; in recliner end of session    Transfers Overall transfer level: Needs assistance Equipment used: Rolling walker (2 wheels) Transfers: Sit to/from Stand Sit to Stand: Contact guard assist           General transfer comment: heavy use of UEs for lift off an anterior weight translation, especially with transition from lower seating surfaces    Ambulation/Gait Ambulation/Gait assistance: Contact guard assist Gait Distance (Feet): 200 Feet Assistive device: Rolling walker (2 wheels)         General Gait Details: forward flexed posture, reciprocal stepping pattern with fair step height/length bilat; mod WBing bilat UEs.  Endorses "catch" in L hip with progressive distance, but no overt buckling or LOB throughout distance.  Mild SOB with gait efforts, sats >93% on RA  Stairs            Wheelchair Mobility     Tilt Bed    Modified Rankin (Stroke Patients Only)       Balance Overall balance assessment: Needs assistance Sitting-balance support: No upper extremity supported, Feet supported Sitting balance-Leahy Scale: Good     Standing balance support: Bilateral upper extremity supported Standing balance-Leahy Scale: Fair  Pertinent Vitals/Pain Pain Assessment Pain Assessment: No/denies pain    Home Living Family/patient expects to be discharged to:: Private residence Living Arrangements: Spouse/significant other Available Help at Discharge: Family;Friend(s);Available PRN/intermittently Type of Home: House Home Access: Stairs to enter Entrance Stairs-Rails: Right Entrance  Stairs-Number of Steps: 5   Home Layout: One level Home Equipment: Agricultural consultant (2 wheels);Tub bench Additional Comments: 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 with dementia (high level cognitive deficits)    Prior Function Prior Level of Function : Needs assist             Mobility Comments: amb with rolling walker household distances, working with HHPT ADLs Comments: MOD I for feeding, grooming, dressing, bathing (with tub bench); assist for IADLs (does not drive) but will prep simple meals, simple clean up around house, manages own meds; help for IADLs from friends from church     Extremity/Trunk Assessment   Upper Extremity Assessment Upper Extremity Assessment: Overall WFL for tasks assessed    Lower Extremity Assessment Lower Extremity Assessment: Generalized weakness (grossly at least 4/5 throughout; no focal weakness, no radicular symptoms.  +2-3 pitting edema knees distally)       Communication   Communication Communication: No apparent difficulties    Cognition Arousal: Alert Behavior During Therapy: WFL for tasks assessed/performed   PT - Cognitive impairments: No apparent impairments                         Following commands: Intact       Cueing       General Comments General comments (skin integrity, edema, etc.): vss throughout    Exercises     Assessment/Plan    PT Assessment Patient needs continued PT services  PT Problem List Decreased strength;Decreased range of motion;Decreased activity tolerance;Decreased balance;Decreased mobility;Decreased coordination;Decreased safety awareness;Decreased knowledge of precautions;Cardiopulmonary status limiting activity       PT Treatment Interventions DME instruction;Gait training;Stair training;Functional mobility training;Therapeutic activities;Therapeutic exercise;Balance training;Patient/family education    PT Goals (Current goals can be found in the Care Plan section)  Acute Rehab PT Goals Patient  Stated Goal: to return home PT Goal Formulation: With patient Time For Goal Achievement: 01/29/24 Potential to Achieve Goals: Good    Frequency Min 2X/week     Co-evaluation               AM-PAC PT "6 Clicks" Mobility  Outcome Measure Help needed turning from your back to your side while in a flat bed without using bedrails?: None Help needed moving from lying on your back to sitting on the side of a flat bed without using bedrails?: A Little Help needed moving to and from a bed to a chair (including a wheelchair)?: A Little Help needed standing up from a chair using your arms (e.g., wheelchair or bedside chair)?: A Little Help needed to walk in hospital room?: A Little Help needed climbing 3-5 steps with a railing? : A Little 6 Click Score: 19    End of Session Equipment Utilized During Treatment: Gait belt Activity Tolerance: Patient tolerated treatment well Patient left: in chair;with call bell/phone within reach (alarm box/cord not available in room, patient aware of need for assist with return to bed.  RN informed/aware.) Nurse Communication: Mobility status PT Visit Diagnosis: Muscle weakness (generalized) (M62.81);Difficulty in walking, not elsewhere classified (R26.2)    Time: 1610-9604 PT Time Calculation (min) (ACUTE ONLY): 26 min   Charges:   PT Evaluation $PT Eval Moderate Complexity: 1 Mod  PT General Charges $$ ACUTE PT VISIT: 1 Visit         Caitrin Pendergraph H. Manson Passey, PT, DPT, NCS 01/15/24, 1:57 PM [phone]

## 2024-01-15 NOTE — Progress Notes (Signed)
 Progress Note   Patient: Jason [name] HYQ:[id number] DOB: 1951-09-22 DOA: 01/13/2024     1 DOS: the patient was seen and examined on 01/15/2024   Brief hospital course: No notes on file  Assessment and Plan: * Acute on chronic diastolic CHF (congestive heart failure) (HCC) H/o CAD s/p 4v [name]   Patient is diuresed with IV lasix. Remains hypervolemic.   Plan:  Continue IV diuresis  Farxiga, and toprol XL Daily weights and I/S Cardiology following    Normocytic anemia  No obvious bleeding.  F/u iron studies b12, folate   Thrombocytopenia  Decreased from day prior. 90k. No bleeding. No HIV/HCV. Smear with normal platelet morphology.  F/u vitamin studies    Hypokalemia Resolved    AKI (acute kidney injury) (HCC) Likely cardiorenal.  Will continue to monitor with IV diuresis    Atrial fibrillation, chronic (HCC) Continue amiodarone, Toprol-XL and Eliquis.  Type 2 diabetes mellitus with peripheral neuropathy (HCC)  A1c 5.8. Well controlled.  CBG (last 3)  Recent Labs    01/15/24 0740 01/15/24 1133 01/15/24 1629  GLUCAP 145* 159* 131*   Continue SSI + Farxiga  GERD without esophagitis - Continue PPI therapy.  Dyslipidemia - Continue statin therapy.      Subjective: Feels shortness of breath is improving with diuresis.   Physical Exam: Vitals:   01/15/24 0734 01/15/24 1130 01/15/24 1349 01/15/24 1634  BP: 114/71 98/66  118/69  Pulse: 86 79  77  Resp: 16   17  Temp: 98.2 F (36.8 C) 98 F (36.7 C)  98.6 F (37 C)  TempSrc: Oral Oral  Oral  SpO2: 91% 90% 93% 94%  Weight:      Height:       Constitutional: In no distress.  Cardiovascular: Normal rate, regular rhythm. 2+ bilateral lower extremity edema  Pulmonary: Non labored breathing on room air, no wheezing or rales.   Abdominal: Soft. Normal bowel sounds. Non distended and non tender Musculoskeletal: Normal range of motion.     Neurological: Alert and oriented to person, place, and time. Non  focal  Skin: Skin is warm and dry.   Data Reviewed:     Latest Ref Rng & Units 01/15/2024    4:41 AM 01/14/2024    5:00 AM 01/13/2024    2:53 PM  CBC  WBC 4.0 - 10.5 K/uL 4.4  4.2    4.2  4.7   Hemoglobin 13.0 - 17.0 g/dL 9.7  95.2    84.1  9.9   Hematocrit 39.0 - 52.0 % 32.0  32.3    33.5  32.2   Platelets 150 - 400 K/uL 90  103    102  115       Latest Ref Rng & Units 01/15/2024    4:41 AM 01/14/2024    5:00 AM 01/13/2024    2:53 PM  BMP  Glucose 70 - 99 mg/dL 324  401  027   BUN 8 - 23 mg/dL 18  17  18    Creatinine 0.61 - 1.24 mg/dL 2.53  6.64  4.03   Sodium 135 - 145 mmol/L 142  145  139   Potassium 3.5 - 5.1 mmol/L 3.7  3.2  2.9   Chloride 98 - 111 mmol/L 103  103  100   CO2 22 - 32 mmol/L 31  36  32   Calcium 8.9 - 10.3 mg/dL 9.4  9.1  9.0      Family Communication: None at bedside   Disposition:  Status is: Inpatient Remains inpatient appropriate because: symptomatic and requires continued IV diuresis.   Planned Discharge Destination:  Pending     Time spent: 35 minutes  Author: Marolyn Haller, MD 01/15/2024 5:15 PM  For on call review [email or web address].

## 2024-01-15 NOTE — Progress Notes (Signed)
 SUBJECTIVE: Patient is still short of breath but feels better and diuresing well.   Vitals:   01/15/24 0026 01/15/24 0405 01/15/24 0500 01/15/24 0734  BP: 126/73 111/63  114/71  Pulse: 79 78  86  Resp: 20 20  16   Temp: (!) 97.4 F (36.3 C) (!) 97.5 F (36.4 C)  98.2 F (36.8 C)  TempSrc:    Oral  SpO2: (!) 85% 92%  91%  Weight:   100 kg   Height:        Intake/Output Summary (Last 24 hours) at 01/15/2024 1122 Last data filed at 01/15/2024 1042 Gross per 24 hour  Intake 240 ml  Output 3075 ml  Net -2835 ml    LABS: Basic Metabolic Panel: Recent Labs    01/13/24 1453 01/14/24 0500 01/15/24 0441  NA 139 145 142  K 2.9* 3.2* 3.7  CL 100 103 103  CO2 32 36* 31  GLUCOSE 155* 182* 137*  BUN 18 17 18   CREATININE 1.39* 1.27* 1.34*  CALCIUM 9.0 9.1 9.4  MG 2.1  --  2.0   Liver Function Tests: No results for input(s): "AST", "ALT", "ALKPHOS", "BILITOT", "PROT", "ALBUMIN" in the last 72 hours. No results for input(s): "LIPASE", "AMYLASE" in the last 72 hours. CBC: Recent Labs    01/14/24 0500 01/15/24 0441  WBC 4.2  4.2 4.4  NEUTROABS 2.5  --   HGB 10.1*  10.0* 9.7*  HCT 33.5*  32.3* 32.0*  MCV 93.3  90.5 93.0  PLT 102*  103* 90*   Cardiac Enzymes: No results for input(s): "CKTOTAL", "CKMB", "CKMBINDEX", "TROPONINI" in the last 72 hours. BNP: Invalid input(s): "POCBNP" D-Dimer: No results for input(s): "DDIMER" in the last 72 hours. Hemoglobin A1C: No results for input(s): "HGBA1C" in the last 72 hours. Fasting Lipid Panel: No results for input(s): "CHOL", "HDL", "LDLCALC", "TRIG", "CHOLHDL", "LDLDIRECT" in the last 72 hours. Thyroid Function Tests: No results for input(s): "TSH", "T4TOTAL", "T3FREE", "THYROIDAB" in the last 72 hours.  Invalid input(s): "FREET3" Anemia Panel: No results for input(s): "VITAMINB12", "FOLATE", "FERRITIN", "TIBC", "IRON", "RETICCTPCT" in the last 72 hours.   PHYSICAL EXAM General: Well developed, well nourished, in no  acute distress HEENT:  Normocephalic and atramatic Neck:  No JVD.  Lungs: Clear bilaterally to auscultation and percussion. Heart: HRRR . Normal S1 and S2 without gallops or murmurs.  Abdomen: Bowel sounds are positive, abdomen soft and non-tender  Msk:  Back normal, normal gait. Normal strength and tone for age. Extremities: No clubbing, cyanosis or edema.   Neuro: Alert and oriented X 3. Psych:  Good affect, responds appropriately  TELEMETRY: A-fib  ASSESSMENT AND PLAN:    ICD-10-CM   1. Acute on chronic combined systolic and diastolic congestive heart failure (HCC)  I50.43 Ambulatory referral to Cardiology    2. Hypokalemia  E87.6     3. Atrial fibrillation, unspecified type (HCC)  I48.91     4. Paroxysmal atrial fibrillation (HCC)  I48.0        .  Acute on chronic HFpEF, with shortness of breath and 2+ pitting bilateral peripheral edema, x 1 to 2 months, BNP 264.5.  2D echocardiogram 10/26/2023 revealed recovery of LV function, EF 50-55%. 2.  Coronary artery disease, status post [name] times 46/20/2017, without chest pain, troponin normal (10). 3.  Paroxysmal atrial fibrillation, on Eliquis for stroke prevention, and amiodarone for rate and rhythm control   Recommendations   1.  Agree with current therapy 2.  Continue Eliquis for stroke prevention 3.  Continue diuresis, furosemide 60 mg IV every 12 4.  Carefully monitor renal status 5.  Plan for TEE/DCCV on 01/17/2024 per Dr. Verlee Rossetti, MD, [facility] 01/15/2024 11:22 AM

## 2024-01-16 DIAGNOSIS — I4891 Unspecified atrial fibrillation: Secondary | ICD-10-CM | POA: Diagnosis not present

## 2024-01-16 DIAGNOSIS — E1142 Type 2 diabetes mellitus with diabetic polyneuropathy: Secondary | ICD-10-CM | POA: Diagnosis not present

## 2024-01-16 DIAGNOSIS — E876 Hypokalemia: Secondary | ICD-10-CM | POA: Diagnosis not present

## 2024-01-16 DIAGNOSIS — I5033 Acute on chronic diastolic (congestive) heart failure: Secondary | ICD-10-CM | POA: Diagnosis not present

## 2024-01-16 LAB — CBC
HCT: 31.5 % — ABNORMAL LOW (ref 39.0–52.0)
Hemoglobin: 9.9 g/dL — ABNORMAL LOW (ref 13.0–17.0)
MCH: 28.1 pg (ref 26.0–34.0)
MCHC: 31.4 g/dL (ref 30.0–36.0)
MCV: 89.5 fL (ref 80.0–100.0)
Platelets: 86 10*3/uL — ABNORMAL LOW (ref 150–400)
RBC: 3.52 MIL/uL — ABNORMAL LOW (ref 4.22–5.81)
RDW: 14.6 % (ref 11.5–15.5)
WBC: 4.6 10*3/uL (ref 4.0–10.5)
nRBC: 0 % (ref 0.0–0.2)

## 2024-01-16 LAB — BASIC METABOLIC PANEL
Anion gap: 8 (ref 5–15)
BUN: 19 mg/dL (ref 8–23)
CO2: 32 mmol/L (ref 22–32)
Calcium: 9.2 mg/dL (ref 8.9–10.3)
Chloride: 100 mmol/L (ref 98–111)
Creatinine, Ser: 1.28 mg/dL — ABNORMAL HIGH (ref 0.61–1.24)
GFR, Estimated: 59 mL/min — ABNORMAL LOW (ref >60)
Glucose, Bld: 143 mg/dL — ABNORMAL HIGH (ref 70–99)
Potassium: 3.3 mmol/L — ABNORMAL LOW (ref 3.5–5.1)
Sodium: 140 mmol/L (ref 135–145)

## 2024-01-16 LAB — FOLATE: Folate: 10.3 ng/mL (ref >5.9)

## 2024-01-16 LAB — IRON AND TIBC
Iron: 41 ug/dL — ABNORMAL LOW (ref 45–182)
Saturation Ratios: 14 % — ABNORMAL LOW (ref 17.9–39.5)
TIBC: 300 ug/dL (ref 250–450)
UIBC: 259 ug/dL

## 2024-01-16 LAB — GLUCOSE, CAPILLARY
Glucose-Capillary: 139 mg/dL — ABNORMAL HIGH (ref 70–99)
Glucose-Capillary: 162 mg/dL — ABNORMAL HIGH (ref 70–99)
Glucose-Capillary: 186 mg/dL — ABNORMAL HIGH (ref 70–99)
Glucose-Capillary: 154 mg/dL — ABNORMAL HIGH (ref 70–99)

## 2024-01-16 LAB — VITAMIN B12: Vitamin B-12: 212 pg/mL (ref 180–914)

## 2024-01-16 LAB — FERRITIN: Ferritin: 56 ng/mL (ref 24–336)

## 2024-01-16 LAB — MAGNESIUM: Magnesium: 2.1 mg/dL (ref 1.7–2.4)

## 2024-01-16 NOTE — Progress Notes (Signed)
 SUBJECTIVE: Patient is less short of breath this morning.   Vitals:   01/16/24 0414 01/16/24 0513 01/16/24 0743 01/16/24 1206  BP: 128/76  127/80 (!) 90/58  Pulse: 87  85 85  Resp: 20  17 16   Temp: 98 F (36.7 C)  97.7 F (36.5 C) 98.1 F (36.7 C)  TempSrc:   Oral Oral  SpO2: 92%  92% 93%  Weight:  109 kg    Height:        Intake/Output Summary (Last 24 hours) at 01/16/2024 1216 Last data filed at 01/16/2024 1026 Gross per 24 hour  Intake 2040 ml  Output 4100 ml  Net -2060 ml    LABS: Basic Metabolic Panel: Recent Labs    01/15/24 0441 01/16/24 0425  NA 142 140  K 3.7 3.3*  CL 103 100  CO2 31 32  GLUCOSE 137* 143*  BUN 18 19  CREATININE 1.34* 1.28*  CALCIUM 9.4 9.2  MG 2.0 2.1   Liver Function Tests: No results for input(s): "AST", "ALT", "ALKPHOS", "BILITOT", "PROT", "ALBUMIN" in the last 72 hours. No results for input(s): "LIPASE", "AMYLASE" in the last 72 hours. CBC: Recent Labs    01/14/24 0500 01/15/24 0441 01/16/24 0425  WBC 4.2  4.2 4.4 4.6  NEUTROABS 2.5  --   --   HGB 10.1*  10.0* 9.7* 9.9*  HCT 33.5*  32.3* 32.0* 31.5*  MCV 93.3  90.5 93.0 89.5  PLT 102*  103* 90* 86*   Cardiac Enzymes: No results for input(s): "CKTOTAL", "CKMB", "CKMBINDEX", "TROPONINI" in the last 72 hours. BNP: Invalid input(s): "POCBNP" D-Dimer: No results for input(s): "DDIMER" in the last 72 hours. Hemoglobin A1C: No results for input(s): "HGBA1C" in the last 72 hours. Fasting Lipid Panel: No results for input(s): "CHOL", "HDL", "LDLCALC", "TRIG", "CHOLHDL", "LDLDIRECT" in the last 72 hours. Thyroid Function Tests: No results for input(s): "TSH", "T4TOTAL", "T3FREE", "THYROIDAB" in the last 72 hours.  Invalid input(s): "FREET3" Anemia Panel: Recent Labs    01/16/24 0425  VITAMINB12 212  FOLATE 10.3  FERRITIN 56  TIBC 300  IRON 41*     PHYSICAL EXAM General: Well developed, well nourished, in no acute distress HEENT:  Normocephalic and  atramatic Neck:  No JVD.  Lungs: Clear bilaterally to auscultation and percussion. Heart: HRRR . Normal S1 and S2 without gallops or murmurs.  Abdomen: Bowel sounds are positive, abdomen soft and non-tender  Msk:  Back normal, normal gait. Normal strength and tone for age. Extremities: No clubbing, cyanosis or edema.   Neuro: Alert and oriented X 3. Psych:  Good affect, responds appropriately  TELEMETRY: Atrial fibrillation with rapid ventricular response rate about 125 bpm.  ASSESSMENT AND PLAN:    ICD-10-CM   1. Acute on chronic combined systolic and diastolic congestive heart failure (HCC)  I50.43 Ambulatory referral to Cardiology    2. Hypokalemia  E87.6     3. Atrial fibrillation, unspecified type (HCC)  I48.91     4. Paroxysmal atrial fibrillation (HCC)  I48.0       Principal Problem:   Acute on chronic diastolic CHF (congestive heart failure) (HCC) Active Problems:   Essential hypertension   S/P [name]   Hypothyroidism   Hypokalemia   CAD (coronary artery disease)   Atrial fibrillation, chronic (HCC)   Type 2 diabetes mellitus with peripheral neuropathy (HCC)   Dyslipidemia   GERD without esophagitis   AKI (acute kidney injury) (HCC)   AICD (automatic cardioverter/defibrillator) present    Acute  on chronic HFpEF, with shortness of breath and 2+ pitting bilateral peripheral edema, x 1 to 2 months, BNP 264.5.  2D echocardiogram 10/26/2023 revealed recovery of LV function, EF 50-55%. 2.  Coronary artery disease, status post [name] times 46/20/2017, without chest pain, troponin normal (10). 3.  Paroxysmal atrial fibrillation, on Eliquis for stroke prevention, and amiodarone for rate and rhythm control   Recommendations   1.  Agree with current therapy 2.  Continue Eliquis for stroke prevention 3.  Continue diuresis, furosemide 60 mg IV every 12 4.  Carefully monitor renal status 5.  Plan for TEE/DCCV on 01/17/2024 per Dr. Verlee Rossetti, MD,  [facility] 01/16/2024 12:16 PM

## 2024-01-16 NOTE — Progress Notes (Signed)
 [facility]  - [place] [facility]   PATIENT NAME: Jason [name]    MR#:  [id number]  DATE OF BIRTH:  August 16, 1951  SUBJECTIVE:  no family at bedside. Patient came in with increasing shortness of breath and significant leg swelling. Getting IV Lasix and good urine output. Continues to have leg swelling. Shortness of breath improving.    VITALS:  Blood pressure (!) 91/56, pulse 77, temperature 98.1 F (36.7 C), temperature source Oral, resp. rate 16, height 6\' 4"  (1.93 m), weight 109 kg, SpO2 93%.  PHYSICAL EXAMINATION:   GENERAL:  73 y.o.-year-old patient with no acute distress.  LUNGS: Normal breath sounds bilaterally,decreased bases CARDIOVASCULAR: S1, S2 normal. No murmur   ABDOMEN: Soft, nontender, nondistended. Bowel sounds present.  EXTREMITIES: ++ edema b/l.    NEUROLOGIC: nonfocal  patient is alert and awake  LABORATORY PANEL:  CBC Recent Labs  Lab 01/16/24 0425  WBC 4.6  HGB 9.9*  HCT 31.5*  PLT 86*    Chemistries  Recent Labs  Lab 01/16/24 0425  NA 140  K 3.3*  CL 100  CO2 32  GLUCOSE 143*  BUN 19  CREATININE 1.28*  CALCIUM 9.2  MG 2.1    Assessment and Plan MAXI RODAS is a 73 y.o. Caucasian male with medical history significant for diastolic CHF, coronary artery disease, type diabetes mellitus, essential hypertension, hypothyroidism, s/p AICD, P A-fib, who presented to the ER with acute onset of worsening dyspnea over the last 3 weeks with increasing lower extremity edema.   Acute on chronic diastolic CHF (congestive heart failure) (HCC) H/o CAD s/p 4v [name]   --Patient is diuresed with IV lasix. Remains hypervolemic.  --Marcelline Deist, and toprol XL --Daily weights and I/S --[facility] Cardiology following -- plans for TEE cardioversion tomorrow    Thrombocytopenia  Decreased from day prior. 90k. No bleeding. No HIV/HCV. Smear with normal platelet morphology.  F/u vitamin studies    Hypokalemia Resolved    AKI (acute kidney injury)  (HCC) Likely cardiorenal.  Will continue to monitor with IV diuresis    Atrial fibrillation, chronic (HCC) --Continue amiodarone, Toprol-XL and Eliquis.   Type 2 diabetes mellitus with peripheral neuropathy (HCC)  --A1c 5.8. Well controlled.  --Continue SSI + Farxiga   GERD without esophagitis - Continue PPI therapy.   Dyslipidemia - Continue statin therapy.   Procedures: Family communication : none today Consults : [facility] cardiology CODE STATUS: full DVT Prophylaxis : eliquis Level of care: Telemetry Cardiac Status is: Inpatient Remains inpatient appropriate because: continue IV diuresis and plans for cardioversion tomorrow    TOTAL TIME TAKING CARE OF THIS PATIENT: 50 minutes.  >50% time spent on counselling and coordination of care  Note: This dictation was prepared with Dragon dictation along with smaller phrase technology. Any transcriptional errors that result from this process are unintentional.  Enedina Finner M.D    [facility]   CC: Primary care physician; Lonie Peak, PA-C

## 2024-01-16 NOTE — TOC Initial Note (Signed)
 Transition of Care [facility]) - Initial/Assessment Note    Patient Details  Name: Jason [name] MRN: [id number] Date of Birth: 1951-06-28  Transition of Care [facility]) CM/SW Contact:    Rodney Langton, RN Phone Number: 01/16/2024, 10:45 AM  Clinical Narrative:                  Patient admitted from home, N. Melford Aase is primary physician, [facility] [place].  Recommendations for home health PT/OT, confirmed with Heather at [facility] that patient is already active with their team.  Will need resumption of care orders once medically ready for discharge.   Expected Discharge Plan: Home w Home Health Services Barriers to Discharge: Continued Medical Work up   Patient Goals and CMS Choice Patient states their goals for this hospitalization and ongoing recovery are:: Home with home health services          Expected Discharge Plan and Services     Post Acute Care Choice: Home Health Living arrangements for the past 2 months: Single Family Home                                      Prior Living Arrangements/Services Living arrangements for the past 2 months: Single Family Home                     Activities of Daily Living   ADL Screening (condition at time of admission) Independently performs ADLs?: Yes (appropriate for developmental age) Is the patient deaf or have difficulty hearing?: No Does the patient have difficulty seeing, even when wearing glasses/contacts?: No Does the patient have difficulty concentrating, remembering, or making decisions?: No  Permission Sought/Granted                  Emotional Assessment              Admission diagnosis:  Hypokalemia [E87.6] Paroxysmal atrial fibrillation (HCC) [I48.0] Acute on chronic combined systolic and diastolic congestive heart failure (HCC) [I50.43] Acute on chronic diastolic CHF (congestive heart failure) (HCC) [I50.33] Atrial fibrillation, unspecified type (HCC) [I48.91] Patient  Active Problem List   Diagnosis Date Noted   Acute on chronic diastolic CHF (congestive heart failure) (HCC) 01/14/2024   Atrial fibrillation, chronic (HCC) 01/14/2024   Type 2 diabetes mellitus with peripheral neuropathy (HCC) 01/14/2024   Dyslipidemia 01/14/2024   GERD without esophagitis 01/14/2024   AKI (acute kidney injury) (HCC) 01/14/2024   AICD (automatic cardioverter/defibrillator) present 01/14/2024   Diabetes mellitus without complication (HCC) 12/09/2023   Septic shock (HCC) 12/09/2023   HLD (hyperlipidemia) 12/08/2023   CAD (coronary artery disease) 12/08/2023   Chronic diastolic CHF (congestive heart failure) (HCC) 12/08/2023   UTI (urinary tract infection) 12/08/2023   Unresponsiveness 12/08/2023   Overweight (BMI 25.0-29.9) 12/08/2023   Hypokalemia 11/02/2023   Hypomagnesemia 11/01/2023   Severe sepsis (HCC) 10/31/2023   Acute kidney injury superimposed on CKD (HCC) 10/31/2023   Sacral decubitus ulcer, stage II (HCC) 10/31/2023   Paroxysmal atrial fibrillation (HCC) 10/31/2023   Hypothyroidism 10/31/2023   Ileus (HCC) 10/24/2023   Elevated liver enzymes 10/24/2023   Leukocytosis 10/24/2023   Hypoglycemia 10/24/2023   Spinal stenosis of lumbar region with neurogenic claudication 10/18/2023   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 AT [facility] [address] [place] [address] Phone: [phone] Fax: [phone]     Social Drivers of Health (SDOH) Social History: SDOH Screenings   Food Insecurity: No Food Insecurity (01/14/2024)  Housing: Low Risk  (01/14/2024)  Transportation Needs: No  Transportation Needs (01/14/2024)  Utilities: Not At Risk (01/14/2024)  Financial Resource Strain: Low Risk  (08/02/2018)  Physical Activity: Unknown (08/02/2018)  Social Connections: Socially Integrated (01/14/2024)  Stress: No Stress Concern Present (08/02/2018)  Tobacco Use: High Risk (01/13/2024)   SDOH Interventions:     Readmission Risk Interventions     No data to display

## 2024-01-17 ENCOUNTER — Encounter
Admission: EM | Disposition: A | Discharge status: Home Health | Payer: Self-pay | Source: Ambulatory Visit | Attending: Internal Medicine

## 2024-01-17 ENCOUNTER — Other Ambulatory Visit (HOSPITAL_COMMUNITY): Payer: Self-pay

## 2024-01-17 ENCOUNTER — Telehealth (HOSPITAL_COMMUNITY): Payer: Self-pay | Admitting: Pharmacy Technician

## 2024-01-17 ENCOUNTER — Inpatient Hospital Stay

## 2024-01-17 ENCOUNTER — Encounter: Payer: Self-pay | Admitting: Family Medicine

## 2024-01-17 ENCOUNTER — Inpatient Hospital Stay
Admit: 2024-01-17 | Discharge: 2024-01-17 | Disposition: A | Discharge status: Home / Self Care | Attending: Student | Admitting: Student

## 2024-01-17 DIAGNOSIS — I4811 Longstanding persistent atrial fibrillation: Secondary | ICD-10-CM | POA: Diagnosis not present

## 2024-01-17 DIAGNOSIS — I482 Chronic atrial fibrillation, unspecified: Secondary | ICD-10-CM | POA: Diagnosis not present

## 2024-01-17 DIAGNOSIS — I5033 Acute on chronic diastolic (congestive) heart failure: Secondary | ICD-10-CM | POA: Diagnosis not present

## 2024-01-17 DIAGNOSIS — E1142 Type 2 diabetes mellitus with diabetic polyneuropathy: Secondary | ICD-10-CM | POA: Diagnosis not present

## 2024-01-17 DIAGNOSIS — E039 Hypothyroidism, unspecified: Secondary | ICD-10-CM | POA: Diagnosis not present

## 2024-01-17 HISTORY — PX: CARDIOVERSION: SHX1299

## 2024-01-17 HISTORY — PX: TEE WITHOUT CARDIOVERSION: SHX5443

## 2024-01-17 LAB — BASIC METABOLIC PANEL
Anion gap: 7 (ref 5–15)
BUN: 26 mg/dL — ABNORMAL HIGH (ref 8–23)
CO2: 34 mmol/L — ABNORMAL HIGH (ref 22–32)
Calcium: 9.1 mg/dL (ref 8.9–10.3)
Chloride: 100 mmol/L (ref 98–111)
Creatinine, Ser: 1.37 mg/dL — ABNORMAL HIGH (ref 0.61–1.24)
GFR, Estimated: 54 mL/min — ABNORMAL LOW (ref >60)
Glucose, Bld: 145 mg/dL — ABNORMAL HIGH (ref 70–99)
Potassium: 3 mmol/L — ABNORMAL LOW (ref 3.5–5.1)
Sodium: 141 mmol/L (ref 135–145)

## 2024-01-17 LAB — GLUCOSE, CAPILLARY
Glucose-Capillary: 134 mg/dL — ABNORMAL HIGH (ref 70–99)
Glucose-Capillary: 120 mg/dL — ABNORMAL HIGH (ref 70–99)
Glucose-Capillary: 131 mg/dL — ABNORMAL HIGH (ref 70–99)
Glucose-Capillary: 213 mg/dL — ABNORMAL HIGH (ref 70–99)

## 2024-01-17 LAB — MAGNESIUM: Magnesium: 2 mg/dL (ref 1.7–2.4)

## 2024-01-17 LAB — ECHO TEE

## 2024-01-17 SURGERY — ECHOCARDIOGRAM, TRANSESOPHAGEAL
Anesthesia: General

## 2024-01-17 MED ORDER — PHENYLEPHRINE 80 MCG/ML (10ML) SYRINGE FOR IV PUSH (FOR BLOOD PRESSURE SUPPORT)
PREFILLED_SYRINGE | INTRAVENOUS | Status: DC | PRN
Start: 2024-01-17 — End: 2024-01-17
  Administered 2024-01-17 (×5): 80 ug via INTRAVENOUS

## 2024-01-17 MED ORDER — LIDOCAINE VISCOUS HCL 2 % MT SOLN
OROMUCOSAL | Status: AC
  Filled 2024-01-17: qty 15

## 2024-01-17 MED ORDER — POTASSIUM CHLORIDE CRYS ER 20 MEQ PO TBCR
40.0000 meq | EXTENDED_RELEASE_TABLET | Freq: Once | ORAL | Status: AC
  Administered 2024-01-17: 40 meq via ORAL
  Filled 2024-01-17: qty 2

## 2024-01-17 MED ORDER — DABIGATRAN ETEXILATE MESYLATE 150 MG PO CAPS
150.0000 mg | ORAL_CAPSULE | Freq: Two times a day (BID) | ORAL | Status: DC
  Administered 2024-01-17 – 2024-01-20 (×6): 150 mg via ORAL
  Filled 2024-01-17 (×7): qty 1

## 2024-01-17 MED ORDER — PHENYLEPHRINE 80 MCG/ML (10ML) SYRINGE FOR IV PUSH (FOR BLOOD PRESSURE SUPPORT)
PREFILLED_SYRINGE | INTRAVENOUS | Status: AC
  Filled 2024-01-17: qty 10

## 2024-01-17 MED ORDER — VITAMIN B-12 1000 MCG PO TABS
1000.0000 ug | ORAL_TABLET | Freq: Every day | ORAL | Status: DC
  Administered 2024-01-17 – 2024-01-20 (×4): 1000 ug via ORAL
  Filled 2024-01-17 (×4): qty 1

## 2024-01-17 MED ORDER — LIDOCAINE HCL (CARDIAC) PF 100 MG/5ML IV SOSY
PREFILLED_SYRINGE | INTRAVENOUS | Status: DC | PRN
  Administered 2024-01-17: 100 mg via INTRAVENOUS

## 2024-01-17 MED ORDER — SODIUM CHLORIDE 0.9 % IV SOLN: INTRAVENOUS | Status: DC

## 2024-01-17 MED ORDER — PROPOFOL 500 MG/50ML IV EMUL
INTRAVENOUS | Status: DC | PRN
Start: 2024-01-17 — End: 2024-01-17
  Administered 2024-01-17: 100 mg via INTRAVENOUS
  Administered 2024-01-17 (×5): 20 mg via INTRAVENOUS

## 2024-01-17 MED ORDER — PROPOFOL 10 MG/ML IV BOLUS
INTRAVENOUS | Status: AC
Start: 2024-01-17 — End: ?
  Filled 2024-01-17: qty 80

## 2024-01-17 MED ORDER — POTASSIUM CHLORIDE CRYS ER 20 MEQ PO TBCR
60.0000 meq | EXTENDED_RELEASE_TABLET | Freq: Once | ORAL | Status: AC
  Administered 2024-01-17: 60 meq via ORAL
  Filled 2024-01-17: qty 3

## 2024-01-17 MED ORDER — GLYCOPYRROLATE 0.2 MG/ML IJ SOLN
INTRAMUSCULAR | Status: AC
  Filled 2024-01-17: qty 2

## 2024-01-17 MED ORDER — BUTAMBEN-TETRACAINE-BENZOCAINE 2-2-14 % EX AERO
INHALATION_SPRAY | CUTANEOUS | Status: AC
  Filled 2024-01-17: qty 5

## 2024-01-17 NOTE — Transfer of Care (Signed)
 Immediate Anesthesia Transfer of Care Note  Patient: Jason [name]  Procedure(s) Performed: ECHOCARDIOGRAM, TRANSESOPHAGEAL CARDIOVERSION  Patient Location: Radiology  Anesthesia Type:General  Level of Consciousness: drowsy  Airway & Oxygen Therapy: Patient Spontanous Breathing and Patient connected to face mask oxygen  Post-op Assessment: Report given to RN and Post -op Vital signs reviewed and stable  Post vital signs: Reviewed and stable  Last Vitals:  Vitals Value Taken Time  BP    Temp    Pulse    Resp    SpO2      Last Pain:  Vitals:   01/17/24 1143  TempSrc: Oral  PainSc: 0-No pain      Patients Stated Pain Goal: 0 (01/15/24 2225)  Complications: There were no known notable events for this encounter.

## 2024-01-17 NOTE — Progress Notes (Signed)
 [facility]  - [facility]   PATIENT NAME: Jason [name]    MR#:  [id number]  DATE OF BIRTH:  04/13/1951  SUBJECTIVE:  no family at bedside. Patient came in with increasing shortness of breath and significant leg swelling. Getting IV Lasix and good urine output. Continues to have leg swelling. Shortness of breath improving.    VITALS:  Blood pressure (!) 97/54, pulse 78, temperature 97.7 F (36.5 C), temperature source Oral, resp. rate 20, height 6\' 4"  (1.93 m), weight 107 kg, SpO2 94%.  PHYSICAL EXAMINATION:   GENERAL:  73 y.o.-year-old patient with no acute distress.  LUNGS: Normal breath sounds bilaterally,decreased bases CARDIOVASCULAR: S1, S2 normal. No murmur   ABDOMEN: Soft, nontender, nondistended. Bowel sounds present.  EXTREMITIES: ++ edema b/l.    NEUROLOGIC: nonfocal  patient is alert and awake  LABORATORY PANEL:  CBC Recent Labs  Lab 01/16/24 0425  WBC 4.6  HGB 9.9*  HCT 31.5*  PLT 86*    Chemistries  Recent Labs  Lab 01/17/24 0618  NA 141  K 3.0*  CL 100  CO2 34*  GLUCOSE 145*  BUN 26*  CREATININE 1.37*  CALCIUM 9.1  MG 2.0    Assessment and Plan Jason [name] is a 73 y.o. Caucasian male with medical history significant for diastolic CHF, coronary artery disease, type diabetes mellitus, essential hypertension, hypothyroidism, s/p AICD, P A-fib, who presented to the ER with acute onset of worsening dyspnea over the last 3 weeks with increasing lower extremity edema.   Acute on chronic diastolic CHF (congestive heart failure) (HCC) H/o CAD s/p 4v [name]   --Patient is diuresed with IV lasix. Remains hypervolemic.  --Marcelline Deist, and toprol XL --Daily weights and I/O --[facility] following -- plans for TEE cardioversion today    Thrombocytopenia  Decreased from day prior. 90k. No bleeding. No HIV/HCV. Smear with normal platelet morphology.  F/u vitamin studies --B12 on lower end of normal. Will start B12   Hypokalemia  Resolved    AKI (acute kidney injury) (HCC) Likely cardiorenal.  Will continue to monitor with IV diuresis    Atrial fibrillation, chronic (HCC) --Continue amiodarone, Toprol-XL and Eliquis.   Type 2 diabetes mellitus with peripheral neuropathy (HCC)  --A1c 5.8. Well controlled.  --Continue SSI + Farxiga   GERD without esophagitis - Continue PPI therapy.   Dyslipidemia - Continue statin therapy.   Procedures: Family communication : none today Consults : [facility] cardiology CODE STATUS: full DVT Prophylaxis : eliquis Level of care: Telemetry Cardiac Status is: Inpatient Remains inpatient appropriate because: continue IV diuresis and plans for cardioversion today    TOTAL TIME TAKING CARE OF THIS PATIENT: 50 minutes.  >50% time spent on counselling and coordination of care  Note: This dictation was prepared with Dragon dictation along with smaller phrase technology. Any transcriptional errors that result from this process are unintentional.  Enedina Finner M.D    [facility]   CC: Primary care physician; Lonie Peak, PA-C

## 2024-01-17 NOTE — Progress Notes (Signed)
 Physical Therapy Treatment Patient Details Name: Jason [name] MRN: [id number] DOB: February 06, 1951 Today's Date: 01/17/2024   History of Present Illness Pt is a 73 year old male presented to the ER with acute onset of worsening dyspnea over the last 3 weeks with increasing lower extremity edema, admitted with Acute on chronic diastolic CHF (congestive heart failure), hypokalemia, AKI, afib,     PMH significant for diastolic CHF, coronary artery disease, type diabetes mellitus, essential hypertension, hypothyroidism, s/p AICD, P A-fib    PT Comments  Patient progressing towards physical therapy goals. Requires increased time to complete bed mobility. Able to complete sit to stand with CGA. Ambulated 200' with RW and CGA, complaining of L hip catching throughout session. Planning for TEE this date. Discharge plan remains appropriate.     If plan is discharge home, recommend the following: A little help with walking and/or transfers;A little help with bathing/dressing/bathroom   Can travel by private vehicle        Equipment Recommendations  None recommended by PT    Recommendations for Other Services       Precautions / Restrictions Precautions Precautions: Fall Recall of Precautions/Restrictions: Intact Restrictions Weight Bearing Restrictions Per Provider Order: No     Mobility  Bed Mobility Overal bed mobility: Modified Independent             General bed mobility comments: increased time to complete    Transfers Overall transfer level: Needs assistance Equipment used: Rolling Jaylend Reiland (2 wheels) Transfers: Sit to/from Stand Sit to Stand: Contact guard assist                Ambulation/Gait Ambulation/Gait assistance: Contact guard assist Gait Distance (Feet): 200 Feet Assistive device: Rolling Suzetta Timko (2 wheels) Gait Pattern/deviations: Step-through pattern, Decreased stride length, Trunk flexed Gait velocity: decreased     General Gait Details: forward flexed  posture. complaining of L hip catching throughout   Stairs             Wheelchair Mobility     Tilt Bed    Modified Rankin (Stroke Patients Only)       Balance Overall balance assessment: Needs assistance Sitting-balance support: No upper extremity supported, Feet supported Sitting balance-Leahy Scale: Good     Standing balance support: Bilateral upper extremity supported Standing balance-Leahy Scale: Fair                              Hotel manager: No apparent difficulties  Cognition Arousal: Alert Behavior During Therapy: WFL for tasks assessed/performed   PT - Cognitive impairments: No apparent impairments                         Following commands: Intact      Cueing Cueing Techniques: Verbal cues  Exercises      General Comments        Pertinent Vitals/Pain Pain Assessment Pain Assessment: No/denies pain    Home Living                          Prior Function            PT Goals (current goals can now be found in the care plan section) Acute Rehab PT Goals Patient Stated Goal: to return home PT Goal Formulation: With patient Time For Goal Achievement: 01/29/24 Potential to Achieve Goals: Good Progress towards PT goals: Progressing toward  goals    Frequency    Min 2X/week      PT Plan      Co-evaluation              AM-PAC PT "6 Clicks" Mobility   Outcome Measure  Help needed turning from your back to your side while in a flat bed without using bedrails?: None Help needed moving from lying on your back to sitting on the side of a flat bed without using bedrails?: A Little Help needed moving to and from a bed to a chair (including a wheelchair)?: A Little Help needed standing up from a chair using your arms (e.g., wheelchair or bedside chair)?: A Little Help needed to walk in hospital room?: A Little Help needed climbing 3-5 steps with a railing? : A Little 6  Click Score: 19    End of Session   Activity Tolerance: Patient tolerated treatment well Patient left: in bed;with call bell/phone within reach Nurse Communication: Mobility status PT Visit Diagnosis: Muscle weakness (generalized) (M62.81);Difficulty in walking, not elsewhere classified (R26.2)     Time: 1610-9604 PT Time Calculation (min) (ACUTE ONLY): 16 min  Charges:    $Therapeutic Activity: 8-22 mins PT General Charges $$ ACUTE PT VISIT: 1 Visit                     Maylon Peppers, PT, DPT Physical Therapist - [facility]  [facility]    Ordell Prichett A Junaid Wurzer 01/17/2024, 12:31 PM

## 2024-01-17 NOTE — Progress Notes (Signed)
*  PRELIMINARY RESULTS* Echocardiogram Echocardiogram Transesophageal has been performed.  Jason [name] 01/17/2024, 1:28 PM

## 2024-01-17 NOTE — Anesthesia Preprocedure Evaluation (Addendum)
 Anesthesia Evaluation  Patient identified by MRN, date of birth, ID band Patient awake    Reviewed: Allergy & Precautions, H&P , NPO status , Patient's Chart, lab work & pertinent test results  Airway Mallampati: III  TM Distance: >3 FB Neck ROM: Full    Dental  (+) Poor Dentition   Pulmonary shortness of breath and with exertion   Pulmonary exam normal breath sounds clear to auscultation       Cardiovascular Exercise Tolerance: Poor hypertension, Pt. on medications and Pt. on home beta blockers + angina  + CAD, + Past MI, + [name], +CHF (acute on chronic combined systolic and diastolic heart failure (LVEF improved from 20-25% to 50-55%)  , due to ICM. NYHA class III-IV symptoms.) and + DOE  + dysrhythmias Atrial Fibrillation + Cardiac Defibrillator ([employer]tronic Evera MRI XT DR [id number] dual chamber ICD 04/12/19), PAF (s/p DCCV 08/02/18),)  Rhythm:Irregular Rate:Normal - Systolic murmurs and - Peripheral Edema    Nuclear Stress Test 10/15/22 ([facility]): 1.  Normal left ventricular function  2.  No regional wall motion abnormalities  3.  Mild to moderate inferior ischemia  - Per cardiology follow-up on 10/22/22, "Coronary artery disease: With mild to moderate inferior ischemia on stress test although completely asymptomatic with no complaints of angina or anginal equivalent at this time. Will continue to pursue medical management at this time."   US Carotid 04/14/16: Summary:  - The vertebral arteries appear patent with antegrade flow.  - Findings consistent with 1-39 percent stenosis involving the    right internal carotid artery and the left internal carotid    artery.      Neuro/Psych  Neuromuscular disease (PN)  negative psych ROS   GI/Hepatic negative GI ROS, Neg liver ROS,,,  Endo/Other  diabetes, Well Controlled, Type 2Hypothyroidism    Renal/GU Renal InsufficiencyRenal disease  negative genitourinary    Musculoskeletal  (+) Arthritis ,    Abdominal   Peds negative pediatric ROS (+)  Hematology  (+) Blood dyscrasia, anemia thrombocytopenia   Anesthesia Other Findings K  3  Reproductive/Obstetrics negative OB ROS                             Anesthesia Physical Anesthesia Plan  ASA: 3  Anesthesia Plan: General   Post-op Pain Management:    Induction: Intravenous  PONV Risk Score and Plan: 2 and Propofol infusion  Airway Management Planned: Natural Airway and Simple Face Mask  Additional Equipment:   Intra-op Plan:   Post-operative Plan:   Informed Consent: I have reviewed the patients History and Physical, chart, labs and discussed the procedure including the risks, benefits and alternatives for the proposed anesthesia with the patient or authorized representative who has indicated his/her understanding and acceptance.     Dental advisory given  Plan Discussed with: Anesthesiologist and CRNA  Anesthesia Plan Comments: (     )       Anesthesia Quick Evaluation

## 2024-01-17 NOTE — Procedures (Signed)
  TRANSESOPHOGEAL ECHO REPORT      IMPRESSIONS    1. Left ventricular ejection fraction, by estimation, is 30 to 35%. The left ventricle has moderate to severely decreased function.  2. Right ventricular systolic function is mildly reduced. The right ventricular size is normal.  3. Left atrial size was dilated. A left atrial/left atrial appendage thrombus was detected.  4. Right atrial size was dilated. paced lead.  5. The mitral valve is normal in structure. Mild to moderate mitral valve regurgitation.  6. The aortic valve is tricuspid. There is mild thickening of the aortic valve. Aortic valve regurgitation is not visualized.  Jason Norfolk, MD [facility] Cardiology- [facility]

## 2024-01-17 NOTE — Progress Notes (Signed)
 [facility] CLINIC CARDIOLOGY PROGRESS NOTE   Patient ID: Jason [name] MRN: [id number] DOB/AGE: Sep 27, 1951 73 y.o.  Admit date: 01/13/2024 Referring Physician Dr. Ashok Pall Primary Physician Lonie Peak, PA-C  Primary Cardiologist Dr. Corky Sing Reason for Consultation AoCHF  HPI: Jason [name] is a 73 y.o. male with a past medical history of persistent atrial fibrillation, CAD with prior [name], heart failure with reduced EF with improved LVEF on last echo, s/p ICD, diabetes, hypertension, hyperlipidemia who presented to the ED on 01/13/2024 for worsening SOB and LE edema.   Interval History:  -Patient seen and examined this AM, resting comfortably in bed.  -LE edema improved overall, still with swelling. -SOB improved, on room air.  -Renal function relatively stable and tolerating diuresis well.   Review of systems complete and found to be negative unless listed above    Vitals:   01/16/24 2328 01/17/24 0439 01/17/24 0500 01/17/24 0847  BP: (!) 111/51 (!) 97/56  (!) 111/56  Pulse: 74   78  Resp:  19    Temp: 98.5 F (36.9 C) 97.9 F (36.6 C)  (!) 97.5 F (36.4 C)  TempSrc: Oral Oral    SpO2: 95% 94%  92%  Weight:   107 kg   Height:         Intake/Output Summary (Last 24 hours) at 01/17/2024 7829 Last data filed at 01/17/2024 0400 Gross per 24 hour  Intake 480 ml  Output 1550 ml  Net -1070 ml     PHYSICAL EXAM General: Chronically-ill appearing male\, well nourished, in no acute distress. HEENT: Normocephalic and atraumatic. Neck: No JVD.  Lungs: Normal respiratory effort on room air. Clear bilaterally to auscultation. No wheezes, crackles, rhonchi.  Heart: Irregularly irregular, controlled rate. Normal S1 and S2 without gallops or murmurs. Radial & DP pulses 2+ bilaterally. Abdomen: Non-distended appearing.  Msk: Normal strength and tone for age. Extremities: No clubbing, cyanosis or edema.   Neuro: Alert and oriented X 3. Psych: Mood appropriate, affect congruent.     LABS: Basic Metabolic Panel: Recent Labs    01/16/24 0425 01/17/24 0618  NA 140 141  K 3.3* 3.0*  CL 100 100  CO2 32 34*  GLUCOSE 143* 145*  BUN 19 26*  CREATININE 1.28* 1.37*  CALCIUM 9.2 9.1  MG 2.1 2.0   Liver Function Tests: No results for input(s): "AST", "ALT", "ALKPHOS", "BILITOT", "PROT", "ALBUMIN" in the last 72 hours. No results for input(s): "LIPASE", "AMYLASE" in the last 72 hours. CBC: Recent Labs    01/15/24 0441 01/16/24 0425  WBC 4.4 4.6  HGB 9.7* 9.9*  HCT 32.0* 31.5*  MCV 93.0 89.5  PLT 90* 86*   Cardiac Enzymes: No results for input(s): "CKTOTAL", "CKMB", "CKMBINDEX", "TROPONINIHS" in the last 72 hours. BNP: No results for input(s): "BNP" in the last 72 hours. D-Dimer: No results for input(s): "DDIMER" in the last 72 hours. Hemoglobin A1C: No results for input(s): "HGBA1C" in the last 72 hours. Fasting Lipid Panel: No results for input(s): "CHOL", "HDL", "LDLCALC", "TRIG", "CHOLHDL", "LDLDIRECT" in the last 72 hours. Thyroid Function Tests: No results for input(s): "TSH", "T4TOTAL", "T3FREE", "THYROIDAB" in the last 72 hours.  Invalid input(s): "FREET3" Anemia Panel: Recent Labs    01/16/24 0425  VITAMINB12 212  FOLATE 10.3  FERRITIN 56  TIBC 300  IRON 41*    No results found.   ECHO TEE scheduled for today  TELEMETRY reviewed by me 01/17/24: atrial fibrillation rate 70s  EKG reviewed by me 01/17/24: atrial fibrillation  rate 75 bpm  DATA reviewed by me 01/17/24: last 24h vitals tele labs imaging I/O, hospitalist progress note  Principal Problem:   Acute on chronic diastolic CHF (congestive heart failure) (HCC) Active Problems:   Essential hypertension   S/P [name]   Atrial fibrillation (HCC)   Hypothyroidism   Hypokalemia   CAD (coronary artery disease)   Atrial fibrillation, chronic (HCC)   Type 2 diabetes mellitus with peripheral neuropathy (HCC)   Dyslipidemia   GERD without esophagitis   AKI (acute kidney  injury) (HCC)   AICD (automatic cardioverter/defibrillator) present    ASSESSMENT AND PLAN: [name] is a 73 y.o. male with a past medical history of persistent atrial fibrillation, CAD with prior [name], heart failure with reduced EF with improved LVEF on last echo, s/p ICD, diabetes, hypertension, hyperlipidemia who presented to the ED on 01/13/2024 for worsening SOB and LE edema.   # Atrial fibrillation RVR # Persistent atrial fibrillation # Acute on chronic HFrEF  # Coronary artery disease s/p [name] Patient sent to ED from our clinic due to significant LE edema and worsening SOB. BNP mildly elevated. Started on IV diuresis and is now net negative 9.2L. Remains in atrial fibrillation but rate has improved.  -Continue amiodarone 100 gm daily, metoprolol succinate 50 mg daily.  -Continue eliquis 5 mg twice daily for stroke risk reduction. Has received all scheduled doses.  -Continue IV lasix 60 mg twice daily. Continue farxiga 10 mg daily.  -Continue rosuvastatin 20 mg daily. Has documented aspirin allergy.   This patient's case was discussed and created with Dr. Corky Sing and he is in agreement.  Signed:  Gale Journey, PA-C  01/17/2024, 9:23 AM [facility] Cardiology

## 2024-01-17 NOTE — Anesthesia Procedure Notes (Addendum)
 Date/Time: 01/17/2024 12:39 PM  Performed by: Lysbeth Penner, CRNAOxygen Delivery Method: Simple face mask Preoxygenation: Pre-oxygenation with 100% oxygen

## 2024-01-17 NOTE — Telephone Encounter (Addendum)
 Patient Product/process development scientist completed.    The patient is insured through [employer]. Patient has Medicare and is not eligible for a copay card, but may be able to apply for patient assistance or Medicare RX Payment Plan (Patient Must reach out to their plan, if eligible for payment plan), if available.    Ran test claim for dabigatran (Pradaxa) 150 mg and the current 30 day co-pay is $151.52 due to deductible.  Ran test claim for Xarelto 20 mg and the current 30 day co-pay is $547.76 due to deductible.   This test claim was processed through [facility]- copay amounts may vary at other pharmacies due to pharmacy/plan contracts, or as the patient moves through the different stages of their insurance plan.     Jason [name], CPHT Pharmacy Technician III Certified Patient Advocate [facility] Pharm[facility] Patient Advocate Team Direct Number: [phone]  Fax: [phone]

## 2024-01-17 NOTE — Telephone Encounter (Signed)
 Error

## 2024-01-17 NOTE — Plan of Care (Signed)
  Problem: Metabolic: Goal: Ability to maintain appropriate glucose levels will improve Outcome: Progressing   Problem: Skin Integrity: Goal: Risk for impaired skin integrity will decrease Outcome: Progressing   Problem: Education: Goal: Knowledge of General Education information will improve Description: Including pain rating scale, medication(s)/side effects and non-pharmacologic comfort measures Outcome: Progressing   Problem: Clinical Measurements: Goal: Ability to maintain clinical measurements within normal limits will improve Outcome: Progressing

## 2024-01-17 NOTE — Progress Notes (Signed)
 Heart Failure Stewardship Pharmacy Note  PCP: Lonie Peak, PA-C PCP-Cardiologist: Dr. Corky Sing   HPI: Jason [name] is a 73 y.o. male with persistent atrial fibrillation, CAD with prior [name], heart failure with improved LVEF on last echo, s/p ICD, type II diabetes, hypertension, hyperlipidemia who presented with shortness of breath and lower extremity edema. On admission, BNP was 264.5, HS-troponin was 10, and iron parameters meet replacement parameters for CHF. Chest x-ray noted small pleural effusions.    Pertinent cardiac history: Cardiac cath in 03/2016 with severe multivessel disease. [name] in 03/2016. Echo in 07/2018 showed LVEF of 20-25% with mild LVH, moderate RV dysfunction, and moderate MR. LVEF unchanged in 08/2018. Echo in 01/2019 showed LVEF improved to 30-35%. Echo in 09/2023 with LVEF improved to 50-55%.  Pertinent Lab Values: Creatinine, Ser  Date Value Ref Range Status  01/17/2024 1.37 (H) 0.61 - 1.24 mg/dL Final   BUN  Date Value Ref Range Status  01/17/2024 26 (H) 8 - 23 mg/dL Final   Potassium  Date Value Ref Range Status  01/17/2024 3.0 (L) 3.5 - 5.1 mmol/L Final   Sodium  Date Value Ref Range Status  01/17/2024 141 135 - 145 mmol/L Final   B Natriuretic Peptide  Date Value Ref Range Status  01/13/2024 264.5 (H) 0.0 - 100.0 pg/mL Final    Comment:    Performed at [facility], [address] [place], [place] [address]   Magnesium  Date Value Ref Range Status  01/17/2024 2.0 1.7 - 2.4 mg/dL Final    Comment:    Performed at [facility], [address] [place], [place] [address]   Hgb A1c MFr Bld  Date Value Ref Range Status  10/13/2023 5.8 (H) 4.8 - 5.6 % Final    Comment:    (NOTE) Pre diabetes:          5.7%-6.4%  Diabetes:              >6.4%  Glycemic control for   <7.0% adults with diabetes    TSH  Date Value Ref Range Status  10/25/2023 0.921 0.350 - 4.500 uIU/mL Final    Comment:    Performed by a 3rd  Generation assay with a functional sensitivity of <=0.01 uIU/mL. Performed at [facility], [address] [place], [place] [address]     Vital Signs:  Temp:  [97.5 F (36.4 C)-98.5 F (36.9 C)] 97.5 F (36.4 C) (03/24 0847) Pulse Rate:  [74-88] 78 (03/24 0847) Cardiac Rhythm: Atrial fibrillation;Bundle branch block (03/24 0924) Resp:  [16-19] 19 (03/24 0439) BP: (90-119)/(51-64) 111/56 (03/24 0847) SpO2:  [85 %-100 %] 92 % (03/24 0847) Weight:  [107 kg (235 lb 12.8 oz)] 107 kg (235 lb 12.8 oz) (03/24 0500)  Intake/Output Summary (Last 24 hours) at 01/17/2024 1102 Last data filed at 01/17/2024 0400 Gross per 24 hour  Intake 240 ml  Output 1550 ml  Net -1310 ml    Current Heart Failure Medications:  Loop diuretic: furosemide 60 mg IV BID Beta-Blocker: metoprolol succinate 50 mg daily ACEI/ARB/ARNI: none MRA: none SGLT2i: Farxiga 10 mg daily  Prior to admission Heart Failure Medications:  Loop diuretic: torsemide 20 mg daily Beta-Blocker: metoprolol succinate 100 mg daily ACEI/ARB/ARNI: none MRA: none SGLT2i: Farxiga 10 mg daily Other:  Assessment: 1. Acute on chronic combined systolic and diastolic heart failure (LVEF improved from 20-25% to 50-55%)  , due to ICM. NYHA class III-IV symptoms.  -Symptoms: Reports shortness of breath has improved since admission.  Still with some orthopnea and DOE. LEE present. -Volume: Remains hypervolemic. Urine output is declining, however urine color remains clear and creatinine is stable so this may be inaccurate. Weight is variable. Would continue furosemide 60 mg IV BID for now.  -Hemodynamics: BP has been on the softer side, limiting GDMT titration. HR is 70s. -BB: Continue metoprolol succinate 50 mg daily. Will follow up on rates after cardioversion as it may need adjustment. -ACEI/ARB/ARNI: BP currently too soft for ARB/ARNI addition. Patient reports he was doing well on Entresto in the past, however it was stopped. -MRA:  BP too soft for MRA at this time, however given persistent hypokalemia, would benefit from initiation when able. -SGLT2i: Continue Farxiga 10 mg daily -DCCV planned today, however it was aborted due to LAA thrombus.  Plan: 1) Medication changes recommended at this time: -None  2) Patient assistance: -Pending  3) Education: - Patient has been educated on current HF medications and potential additions to HF medication regimen - Patient verbalizes understanding that over the next few months, these medication doses may change and more medications may be added to optimize HF regimen - Patient has been educated on basic disease state pathophysiology and goals of therapy  Medication Assistance / Insurance Benefits Check: Does the patient have prescription insurance?    Type of insurance plan:  Does the patient qualify for medication assistance through manufacturers or grants? Pending    Outpatient Pharmacy: [facility]      Please do not hesitate to reach out with questions or concerns,  Enos Fling, PharmD, CPP, BCPS Heart Failure Pharmacist  Phone - [phone] 01/17/2024 2:31 PM

## 2024-01-17 NOTE — Care Management Important Message (Signed)
 Important Message  Patient Details  Name: Jason [name] MRN: [id number] Date of Birth: 05-07-1951   Important Message Given:  Yes - Medicare IM     Cristela Blue, CMA 01/17/2024, 12:57 PM

## 2024-01-17 NOTE — Anesthesia Postprocedure Evaluation (Signed)
 Anesthesia Post Note  Patient: Jason [name]  Procedure(s) Performed: ECHOCARDIOGRAM, TRANSESOPHAGEAL CARDIOVERSION  Patient location during evaluation: PACU Anesthesia Type: General Level of consciousness: awake and alert, oriented and patient cooperative Pain management: pain level controlled Vital Signs Assessment: post-procedure vital signs reviewed and stable Respiratory status: spontaneous breathing, nonlabored ventilation and respiratory function stable Cardiovascular status: blood pressure returned to baseline and stable Postop Assessment: adequate PO intake Anesthetic complications: no   There were no known notable events for this encounter.   Last Vitals:  Vitals:   01/17/24 1345 01/17/24 1400  BP: (!) 101/50 111/61  Pulse: 78 79  Resp: 18 18  Temp:    SpO2: 96% 97%    Last Pain:  Vitals:   01/17/24 1400  TempSrc:   PainSc: 0-No pain                 Reed Breech

## 2024-01-18 ENCOUNTER — Encounter: Payer: Self-pay | Admitting: Cardiology

## 2024-01-18 ENCOUNTER — Other Ambulatory Visit: Payer: Self-pay

## 2024-01-18 DIAGNOSIS — I10 Essential (primary) hypertension: Secondary | ICD-10-CM

## 2024-01-18 LAB — BASIC METABOLIC PANEL
Anion gap: 9 (ref 5–15)
BUN: 22 mg/dL (ref 8–23)
CO2: 34 mmol/L — ABNORMAL HIGH (ref 22–32)
Calcium: 9.5 mg/dL (ref 8.9–10.3)
Chloride: 98 mmol/L (ref 98–111)
Creatinine, Ser: 1.25 mg/dL — ABNORMAL HIGH (ref 0.61–1.24)
GFR, Estimated: >60 mL/min (ref >60)
Glucose, Bld: 119 mg/dL — ABNORMAL HIGH (ref 70–99)
Potassium: 3.9 mmol/L (ref 3.5–5.1)
Sodium: 141 mmol/L (ref 135–145)

## 2024-01-18 LAB — GLUCOSE, CAPILLARY
Glucose-Capillary: 126 mg/dL — ABNORMAL HIGH (ref 70–99)
Glucose-Capillary: 159 mg/dL — ABNORMAL HIGH (ref 70–99)
Glucose-Capillary: 174 mg/dL — ABNORMAL HIGH (ref 70–99)
Glucose-Capillary: 176 mg/dL — ABNORMAL HIGH (ref 70–99)

## 2024-01-18 MED ORDER — METOLAZONE 2.5 MG PO TABS
2.5000 mg | ORAL_TABLET | Freq: Once | ORAL | Status: AC
  Administered 2024-01-18: 2.5 mg via ORAL
  Filled 2024-01-18: qty 1

## 2024-01-18 MED ORDER — POTASSIUM CHLORIDE CRYS ER 20 MEQ PO TBCR
20.0000 meq | EXTENDED_RELEASE_TABLET | Freq: Once | ORAL | Status: AC
  Administered 2024-01-18: 20 meq via ORAL
  Filled 2024-01-18: qty 1

## 2024-01-18 MED ORDER — FUROSEMIDE 10 MG/ML IJ SOLN
60.0000 mg | Freq: Once | INTRAMUSCULAR | Status: AC
  Administered 2024-01-18: 60 mg via INTRAVENOUS
  Filled 2024-01-18: qty 6

## 2024-01-18 MED ORDER — FUROSEMIDE 10 MG/ML IJ SOLN: 60.0000 mg | Freq: Two times a day (BID) | INTRAMUSCULAR | Status: DC

## 2024-01-18 NOTE — Progress Notes (Signed)
 Heart Failure Stewardship Pharmacy Note  PCP: Lonie Peak, PA-C PCP-Cardiologist: Dr. Corky Sing   HPI: Jason [name] is a 73 y.o. male with persistent atrial fibrillation, CAD with prior [name], heart failure with improved LVEF on last echo, s/p ICD, type II diabetes, hypertension, hyperlipidemia who presented with shortness of breath and lower extremity edema. On admission, BNP was 264.5, HS-troponin was 10, and iron parameters meet replacement parameters for CHF. Chest x-ray noted small pleural effusions.    Pertinent cardiac history: Cardiac cath in 03/2016 with severe multivessel disease. [name] in 03/2016. Echo in 07/2018 showed LVEF of 20-25% with mild LVH, moderate RV dysfunction, and moderate MR. LVEF unchanged in 08/2018. Echo in 01/2019 showed LVEF improved to 30-35%. Echo in 09/2023 with LVEF improved to 50-55%. TEE 01/17/24 showed LVEF is back down to 30-35% with mildly reduced RV function, mild-moderate MR, mild TR. A LAA thrombus was detected, so DCCV was aborted.  Pertinent Lab Values: Creatinine, Ser  Date Value Ref Range Status  01/18/2024 1.25 (H) 0.61 - 1.24 mg/dL Final   BUN  Date Value Ref Range Status  01/18/2024 22 8 - 23 mg/dL Final   Potassium  Date Value Ref Range Status  01/18/2024 3.9 3.5 - 5.1 mmol/L Final   Sodium  Date Value Ref Range Status  01/18/2024 141 135 - 145 mmol/L Final   B Natriuretic Peptide  Date Value Ref Range Status  01/13/2024 264.5 (H) 0.0 - 100.0 pg/mL Final    Comment:    Performed at [facility], [address] [place], [place] [address]   Magnesium  Date Value Ref Range Status  01/17/2024 2.0 1.7 - 2.4 mg/dL Final    Comment:    Performed at [facility], [address] [place], [place] [address]   Hgb A1c MFr Bld  Date Value Ref Range Status  10/13/2023 5.8 (H) 4.8 - 5.6 % Final    Comment:    (NOTE) Pre diabetes:          5.7%-6.4%  Diabetes:              >6.4%  Glycemic control for    <7.0% adults with diabetes    TSH  Date Value Ref Range Status  10/25/2023 0.921 0.350 - 4.500 uIU/mL Final    Comment:    Performed by a 3rd Generation assay with a functional sensitivity of <=0.01 uIU/mL. Performed at [facility], [address] [place], [place] [address]     Vital Signs:  Temp:  [97.6 F (36.4 C)-98.1 F (36.7 C)] 98 F (36.7 C) (03/25 1125) Pulse Rate:  [77-97] 97 (03/25 1125) Cardiac Rhythm: Atrial fibrillation (03/25 0701) Resp:  [16-20] 16 (03/25 1125) BP: (96-111)/(50-69) 108/69 (03/25 1125) SpO2:  [93 %-97 %] 97 % (03/25 1125) Weight:  [106.8 kg (235 lb 7.2 oz)] 106.8 kg (235 lb 7.2 oz) (03/25 0500)  Intake/Output Summary (Last 24 hours) at 01/18/2024 1312 Last data filed at 01/18/2024 1044 Gross per 24 hour  Intake 720 ml  Output 3450 ml  Net -2730 ml    Current Heart Failure Medications:  Loop diuretic: furosemide 60 mg IV BID Beta-Blocker: metoprolol succinate 50 mg daily ACEI/ARB/ARNI: none MRA: none SGLT2i: Farxiga 10 mg daily  Prior to admission Heart Failure Medications:  Loop diuretic: torsemide 20 mg daily Beta-Blocker: metoprolol succinate 100 mg daily ACEI/ARB/ARNI: none MRA: none SGLT2i: Farxiga 10 mg daily Other:  Assessment: 1. Acute on chronic combined systolic and diastolic heart failure (LVEF improved  from 20-25% to 50-55%)  , due to ICM. NYHA class III-IV symptoms.  -Symptoms: Reports shortness of breath has improved since admission. Still with some orthopnea and DOE. LEE still present. -Volume: Remains hypervolemic. Urine output is stable and urine color remains clear.  Weight is variable. Would continue furosemide 60 mg IV BID. -Hemodynamics: BP has been on the softer side, limiting GDMT titration. HR is 70-90. -BB: Continue metoprolol succinate 50 mg daily.  -ACEI/ARB/ARNI: BP currently too soft for ARB/ARNI addition. Patient reports he was doing well on Entresto in the past, however it was stopped. -MRA:  BP too soft for MRA at this time, however given persistent hypokalemia, would benefit from initiation when SBP is consistently > ~105 mmHG. -SGLT2i: Continue Farxiga 10 mg daily -DCCV planned 01/17/24, however it was aborted due to LAA thrombus.  Plan: 1) Medication changes recommended at this time: -None  2) Patient assistance: -Pending  3) Education: - Patient has been educated on current HF medications and potential additions to HF medication regimen - Patient verbalizes understanding that over the next few months, these medication doses may change and more medications may be added to optimize HF regimen - Patient has been educated on basic disease state pathophysiology and goals of therapy  Medication Assistance / Insurance Benefits Check: Does the patient have prescription insurance?    Type of insurance plan:  Does the patient qualify for medication assistance through manufacturers or grants? Pending    Outpatient Pharmacy: [facility]      Please do not hesitate to reach out with questions or concerns,  Enos Fling, PharmD, CPP, BCPS Heart Failure Pharmacist  Phone - [phone] 01/18/2024 1:12 PM

## 2024-01-18 NOTE — Progress Notes (Signed)
 [facility] Cardiology  CARDIOLOGY PROGRESS NOTE  Patient ID: Jason [name] MRN: [id number] DOB/AGE: 05-30-1951 73 y.o.  Admit date: 01/13/2024 Referring Physician Dr. Ashok Pall Primary Physician Lonie Peak, PA-C  Primary Cardiologist Dr. Corky Sing Reason for Consultation AoCHF  HPI: Shortness of breath improved.  Lower extremity pedal edema improving.  Diuresing very well.  Review of systems complete and found to be negative unless listed above     Past Medical History:  Diagnosis Date   AICD (automatic cardioverter/defibrillator) present    [employer]   Arthritis    CHF (congestive heart failure) (HCC)    Coronary artery disease involving native coronary artery with unstable angina pectoris (HCC) 04/13/2016   Coronary artery disease with history of myocardial infarction without history of [name]    Dr. Barry Dienes   Cough    CHRONIC   Diabetes mellitus without complication (HCC)    Edema    FEET/LEGS   Essential hypertension    GERD without esophagitis 01/14/2024   Hypertension    Hypothyroidism    Left main coronary artery disease 04/13/2016   Mixed hyperlipidemia    Obesity    PAF (paroxysmal atrial fibrillation) (HCC)    S/P [name] 04/14/2016   LIMA to LAD, SVG to D1, SVG to OM, SVG to PDA, EVH via right thigh and leg   Type II diabetes mellitus (HCC)    Unstable angina (HCC) 04/13/2016    Past Surgical History:  Procedure Laterality Date   BACK SURGERY     Lumbar Surgeries. [place], [place]. Dr. Elesa Hacker x2   CARDIAC CATHETERIZATION Left 04/13/2016   Procedure: Left Heart Cath and Coronary Angiography;  Surgeon: [name], MD;  Location: [facility] INVASIVE CV LAB;  Service: Cardiovascular;  Laterality: Left;   CARDIOVERSION N/A 08/02/2018   Procedure: CARDIOVERSION;  Surgeon: [name], MD;  Location: [facility] ORS;  Service: Cardiovascular;  Laterality: N/A;   CARDIOVERSION N/A 01/17/2024   Procedure: CARDIOVERSION;  Surgeon: Hope Holst, Meryl Dare, MD;  Location: [facility] ORS;   Service: Cardiovascular;  Laterality: N/A;   CATARACT EXTRACTION W/PHACO Right 01/23/2016   Procedure: CATARACT EXTRACTION PHACO AND INTRAOCULAR LENS PLACEMENT (IOC);  Surgeon: Galen Manila, MD;  Location: [facility] ORS;  Service: Ophthalmology;  Laterality: Right;  [place] 01:18 AP% 23.8 CDE 18.67 fluid pack lot # [id number]   CHOLECYSTECTOMY     CORONARY ARTERY BYPASS GRAFT N/A 04/14/2016   Procedure: CORONARY ARTERY BYPASS GRAFTING TIMES FOUR    USING LEFT INTERNAL MAMMARY AND ENDOSCOPIC HARVEST RIGHT SAPHENOUS VEIN;  Surgeon: Purcell Nails, MD;  Location: [facility] OR;  Service: Open Heart Surgery;  Laterality: N/A;   EYE SURGERY Bilateral    Cataract Extraction with IOL implant.   INTRAOPERATIVE TRANSESOPHAGEAL ECHOCARDIOGRAM N/A 04/14/2016   Procedure: INTRAOPERATIVE TRANSESOPHAGEAL ECHOCARDIOGRAM;  Surgeon: Purcell Nails, MD;  Location: [facility] OR;  Service: Open Heart Surgery;  Laterality: N/A;   KNEE ARTHROSCOPY Left 10/08/2015   Procedure: ARTHROSCOPY KNEE, PARTIAL MEDIAL AND LATERAL MENISECTOMY, REMOVAL OF FOREIGN BODY;  Surgeon: Kennedy Bucker, MD;  Location: [facility] ORS;  Service: Orthopedics;  Laterality: Left;   LUMBAR LAMINECTOMY/DECOMPRESSION MICRODISCECTOMY Bilateral 10/18/2023   Procedure: Lumbar Three-Four, Lumbar Four-Five  Laminotomy, Foraminotomy;  Surgeon: Tressie Stalker, MD;  Location: [facility] OR;  Service: Neurosurgery;  Laterality: Bilateral;  3C   TEE WITHOUT CARDIOVERSION N/A 01/17/2024   Procedure: ECHOCARDIOGRAM, TRANSESOPHAGEAL;  Surgeon: Gala Padovano, Meryl Dare, MD;  Location: [facility] ORS;  Service: Cardiovascular;  Laterality: N/A;    Medications Prior to Admission  Medication  Sig Dispense Refill Last Dose/Taking   amiodarone (PACERONE) 200 MG tablet Take 100 mg by mouth in the morning.   Past Week   apixaban (ELIQUIS) 5 MG TABS tablet Take 1 tablet (5 mg total) by mouth 2 (two) times daily.   Past Week   dapagliflozin propanediol (FARXIGA) 10 MG TABS tablet Take 10 mg by mouth in the  morning.   Past Week   gabapentin (NEURONTIN) 100 MG capsule Take 100 mg by mouth at bedtime as needed (nerve pain.).   Taking As Needed   levothyroxine (SYNTHROID) 50 MCG tablet Take 50 mcg by mouth daily at 2 am.   Past Week   melatonin 5 MG TABS Take 1 tablet (5 mg total) by mouth at bedtime.   Past Week   metoprolol succinate (TOPROL-XL) 100 MG 24 hr tablet Take 100 mg by mouth daily.   Past Week   Nystatin (GERHARDT'S BUTT CREAM) CREA Apply 1 Application topically 2 (two) times daily.   Past Week   pantoprazole (PROTONIX) 40 MG tablet Take 1 tablet (40 mg total) by mouth daily.   Past Week   rosuvastatin (CRESTOR) 40 MG tablet Take 40 mg by mouth at bedtime.   Past Week   senna-docusate (SENOKOT-S) 8.6-50 MG tablet Take 1 tablet by mouth at bedtime as needed for moderate constipation.   Taking As Needed   [Paused] torsemide (DEMADEX) 20 MG tablet Take 20 mg by mouth in the morning.   Past Week   acetaminophen (TYLENOL) 500 MG tablet Take 500-1,000 mg by mouth every 6 (six) hours as needed (pain.).      Ensure Max Protein (ENSURE MAX PROTEIN) LIQD Take 330 mLs (11 oz total) by mouth 2 (two) times daily.      Social History   Socioeconomic History   Marital status: Married    Spouse name: Katie   Number of children: 0   Years of education: Not on file   Highest education level: Not on file  Occupational History   Not on file  Tobacco Use   Smoking status: Never   Smokeless tobacco: Current    Types: Chew   Tobacco comments:    Daily chew  Vaping Use   Vaping status: Never Used  Substance and Sexual Activity   Alcohol use: No   Drug use: No   Sexual activity: Not Currently  Other Topics Concern   Not on file  Social History Narrative   Not on file   Social Drivers of Health   Financial Resource Strain: Low Risk  (08/02/2018)   Overall Financial Resource Strain (CARDIA)    Difficulty of Paying Living Expenses: Not very hard  Food Insecurity: No Food Insecurity (01/14/2024)    Hunger Vital Sign    Worried About Running Out of Food in the Last Year: Never true    Ran Out of Food in the Last Year: Never true  Transportation Needs: No Transportation Needs (01/14/2024)   PRAPARE - Administrator, Civil Service (Medical): No    Lack of Transportation (Non-Medical): No  Physical Activity: Unknown (08/02/2018)   Exercise Vital Sign    Days of Exercise per Week: 0 days    Minutes of Exercise per Session: Not on file  Stress: No Stress Concern Present (08/02/2018)   [employer] of Occupational Health - Occupational Stress Questionnaire    Feeling of Stress : Only a little  Social Connections: Socially Integrated (01/14/2024)   Social Connection and Isolation Panel [NHANES]  Frequency of Communication with Friends and Family: More than three times a week    Frequency of Social Gatherings with Friends and Family: More than three times a week    Attends Religious Services: More than 4 times per year    Active Member of Clubs or Organizations: Yes    Attends Banker Meetings: More than 4 times per year    Marital Status: Married  Catering manager Violence: Not At Risk (01/14/2024)   Humiliation, Afraid, Rape, and Kick questionnaire    Fear of Current or Ex-Partner: No    Emotionally Abused: No    Physically Abused: No    Sexually Abused: No    Family History  Problem Relation Age of Onset   Hypertension Father    Aortic stenosis Father    Heart disease Father       Review of systems complete and found to be negative unless listed above      PHYSICAL EXAM  +2 pedal edema No significant murmur Irregular rhythm, normal rate No wheeze or crackles  Labs:   Lab Results  Component Value Date   WBC 4.6 01/16/2024   HGB 9.9 (L) 01/16/2024   HCT 31.5 (L) 01/16/2024   MCV 89.5 01/16/2024   PLT 86 (L) 01/16/2024    Recent Labs  Lab 01/18/24 0442  NA 141  K 3.9  CL 98  CO2 34*  BUN 22  CREATININE 1.25*  CALCIUM 9.5   GLUCOSE 119*   Lab Results  Component Value Date   CKTOTAL 102 12/09/2023   TROPONINI 0.05 (H) 04/14/2016    Lab Results  Component Value Date   CHOL 191 04/13/2016   Lab Results  Component Value Date   HDL 25 (L) 04/13/2016   Lab Results  Component Value Date   LDLCALC 127 (H) 04/13/2016   Lab Results  Component Value Date   TRIG 135 04/23/2016   TRIG 193 (H) 04/13/2016   Lab Results  Component Value Date   CHOLHDL 7.6 04/13/2016   No results found for: "LDLDIRECT"    Radiology: ECHO TEE Result Date: 01/17/2024    TRANSESOPHOGEAL ECHO REPORT   Patient Name:   CHINEDU AGUSTIN Date of Exam: 01/17/2024 Medical Rec #:  [id number]      Height:       76.0 in Accession #:    [id number]     Weight:       235.8 lb Date of Birth:  05-19-1951       BSA:          2.376 m Patient Age:    72 years       BP:           103/63 mmHg Patient Gender: M              HR:           83 bpm. Exam Location:  [facility] Procedure: Transesophageal Echo, Cardiac Doppler and Color Doppler (Both            Spectral and Color Flow Doppler were utilized during procedure). Indications:     Not listed on TEE check-in sheet  History:         Patient has prior history of Echocardiogram examinations, most                  recent 10/26/2023.  Sonographer:     Cristela Blue Referring Phys:  [id number] CARALYN HUDSON Diagnosing Phys: Windell Norfolk PROCEDURE: After discussion  of the risks and benefits of a TEE, an informed consent was obtained from the patient. The transesophogeal probe was passed without difficulty through the esophogus of the patient. Sedation performed by different physician. The patient was monitored while under deep sedation. Image quality was excellent. The patient's vital signs; including heart rate, blood pressure, and oxygen saturation; remained stable throughout the procedure. The patient developed no complications during the procedure.  IMPRESSIONS  1. Left ventricular ejection fraction, by estimation, is 30 to  35%. The left ventricle has moderate to severely decreased function.  2. Right ventricular systolic function is mildly reduced. The right ventricular size is normal.  3. Left atrial size was dilated. A left atrial/left atrial appendage thrombus was detected.  4. Right atrial size was dilated. paced lead.  5. The mitral valve is normal in structure. Mild to moderate mitral valve regurgitation.  6. The aortic valve is tricuspid. There is mild thickening of the aortic valve. Aortic valve regurgitation is not visualized. FINDINGS  Left Ventricle: Left ventricular ejection fraction, by estimation, is 30 to 35%. The left ventricle has moderate to severely decreased function. The left ventricular internal cavity size was normal in size. Right Ventricle: The right ventricular size is normal. No increase in right ventricular wall thickness. Right ventricular systolic function is mildly reduced. Left Atrium: Left atrial size was dilated. A left atrial/left atrial appendage thrombus was detected. Right Atrium: Right atrial size was dilated. Paced lead. Pericardium: There is no evidence of pericardial effusion. Mitral Valve: The mitral valve is normal in structure. Mild to moderate mitral valve regurgitation. Tricuspid Valve: The tricuspid valve is normal in structure. Tricuspid valve regurgitation is mild. Aortic Valve: The aortic valve is tricuspid. There is mild thickening of the aortic valve. Aortic valve regurgitation is not visualized. Pulmonic Valve: The pulmonic valve was normal in structure. Pulmonic valve regurgitation is trivial. Aorta: The aortic root is normal in size and structure. IAS/Shunts: No atrial level shunt detected by color flow Doppler. Windell Norfolk Electronically signed by Windell Norfolk Signature Date/Time: 01/17/2024/5:20:16 PM    Final    DG Chest 2 View Result Date: 01/13/2024 CLINICAL DATA:  Shortness of breath. EXAM: CHEST - 2 VIEW COMPARISON:  October 24, 2023. FINDINGS: Stable  cardiomediastinal silhouette. Left-sided defibrillator is unchanged. Status post coronary artery bypass graft. Minimal bibasilar subsegmental atelectasis is noted with small pleural effusions. Bony thorax is unremarkable. IMPRESSION: Minimal bibasilar subsegmental atelectasis is noted with small pleural effusions. Electronically Signed   By: Lupita Raider M.D.   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  He is -14 L since admission but still volume up. Continue current IV diuresis likely for another couple days.  Keep potassium greater than 4 and magnesium greater than 2. Continue current medical therapy including Toprol XL, dapagliflozin.  Will hold off on initiating other GDMT at this time with soft blood pressure, to accommodate diuresis. Anticoagulation changed to Pradaxa.  Will consider repeat TEE with possible cardioversion in 6 weeks  Signed: Kathryne Gin MD 01/18/2024, 5:20 PM

## 2024-01-18 NOTE — Progress Notes (Signed)
 [facility]  - [place] at [facility]   PATIENT NAME: Jason [name]    MR#:  [id number]  DATE OF BIRTH:  [birth year]  SUBJECTIVE:  no family at bedside. Patient came in with increasing shortness of breath and significant leg swelling. Getting IV Lasix and good urine output. Continues to have leg swelling. Shortness of breath improving. Patient underwent TEE yesterday found to have left atrial thrombus.  Cardioversion canceled.  VITALS:  Blood pressure 108/69, pulse 97, temperature 98 F (36.7 C), resp. rate 16, height 6\' 4"  (1.93 m), weight 106.8 kg, SpO2 97%.  PHYSICAL EXAMINATION:   GENERAL:  73 y.o.-year-old patient with no acute distress.  LUNGS: Normal breath sounds bilaterally,decreased bases CARDIOVASCULAR: S1, S2 normal. No murmur   ABDOMEN: Soft, nontender, nondistended. Bowel sounds present.  EXTREMITIES: ++ edema b/l.    NEUROLOGIC: nonfocal  patient is alert and awake  LABORATORY PANEL:  CBC Recent Labs  Lab 01/16/24 0425  WBC 4.6  HGB 9.9*  HCT 31.5*  PLT 86*    Chemistries  Recent Labs  Lab 01/17/24 0618 01/18/24 0442  NA 141 141  K 3.0* 3.9  CL 100 98  CO2 34* 34*  GLUCOSE 145* 119*  BUN 26* 22  CREATININE 1.37* 1.25*  CALCIUM 9.1 9.5  MG 2.0  --     Assessment and Plan Jason [name] is a 73 y.o. Caucasian male with medical history significant for diastolic CHF, coronary artery disease, type diabetes mellitus, essential hypertension, hypothyroidism, s/p AICD, P A-fib, who presented to the ER with acute onset of worsening dyspnea over the last 3 weeks with increasing lower extremity edema.   Acute on chronic diastolic CHF (congestive heart failure) (HCC) H/o CAD s/p 4v [name]  Left atrial Thrombus  --Patient is diuresed with IV lasix. Remains hypervolemic.  --Marcelline Deist, and toprol XL --Daily weights and I/O--good uop --[facility]iology following --  TEE showed reduced LVEF of 30 to 35%, left atrial appendage thrombus noted. Currently  he is on Eliquis for anticoagulation.--now pt will be on po Pradaxa --3/25--metolazone 5 mg today x1    Thrombocytopenia  --Decreased from day prior. 90k. No bleeding. No HIV/HCV. Smear with normal platelet morphology.  --F/u vitamin studies --B12 on lower end of normal. Will start B12   Hypokalemia Resolved    AKI (acute kidney injury) (HCC) --Likely cardiorenal.  --Will continue to monitor with IV diuresis    Atrial fibrillation, chronic (HCC) --Continue amiodarone, Toprol-XL and Pradaxa   Type 2 diabetes mellitus with peripheral neuropathy (HCC)  --A1c 5.8. Well controlled.  --Continue SSI + Farxiga   GERD without esophagitis - Continue PPI therapy.   Dyslipidemia - Continue statin therapy.   Procedures: Family communication : none today Consults : [facility] cardiology CODE STATUS: full DVT Prophylaxis :Pradaxa Level of care: Telemetry Cardiac Status is: Inpatient Remains inpatient appropriate because: continue IV diuresis --still quiet a bit of edema both LE  TOTAL TIME TAKING CARE OF THIS PATIENT: 35 minutes.  >50% time spent on counselling and coordination of care  Note: This dictation was prepared with Dragon dictation along with smaller phrase technology. Any transcriptional errors that result from this process are unintentional.  Enedina Finner M.D    [facility]   CC: Primary care physician; Lonie Peak, PA-C

## 2024-01-18 NOTE — Plan of Care (Signed)
  Problem: Pain Managment: Goal: General experience of comfort will improve and/or be controlled Outcome: Progressing   Problem: Elimination: Goal: Will not experience complications related to urinary retention Outcome: Progressing   Problem: Activity: Goal: Risk for activity intolerance will decrease Outcome: Progressing   Problem: Education: Goal: Knowledge of General Education information will improve Description: Including pain rating scale, medication(s)/side effects and non-pharmacologic comfort measures Outcome: Progressing

## 2024-01-18 NOTE — Progress Notes (Signed)
 Physical Therapy Treatment Patient Details Name: Jason [name] MRN: [id number] DOB: 09/05/1951 Today's Date: 01/18/2024   History of Present Illness Pt is a 73 year old male presented to the ER with acute onset of worsening dyspnea over the last 3 weeks with increasing lower extremity edema, admitted with Acute on chronic diastolic CHF (congestive heart failure), hypokalemia, AKI, afib,     PMH significant for diastolic CHF, coronary artery disease, type diabetes mellitus, essential hypertension, hypothyroidism, s/p AICD, P A-fib    PT Comments  Patient progressing towards physical therapy goals. Continues to complain of "catch" in L groin/hip with mobility. Ambulated 200' with RW and CGA-supervision, standing rest break after 30' due to L hip. Returned to sitting on EOB with visitor present. Discharge plan remains appropriate.    If plan is discharge home, recommend the following: A little help with walking and/or transfers;A little help with bathing/dressing/bathroom   Can travel by private vehicle        Equipment Recommendations  None recommended by PT    Recommendations for Other Services       Precautions / Restrictions Precautions Precautions: Fall Recall of Precautions/Restrictions: Intact Restrictions Weight Bearing Restrictions Per Provider Order: No     Mobility  Bed Mobility Overal bed mobility: Modified Independent             General bed mobility comments: increased time to complete    Transfers Overall transfer level: Needs assistance Equipment used: Rolling Zykee Avakian (2 wheels) Transfers: Sit to/from Stand Sit to Stand: Contact guard assist                Ambulation/Gait Ambulation/Gait assistance: Contact guard assist Gait Distance (Feet): 200 Feet Assistive device: Rolling Dorthie Santini (2 wheels) Gait Pattern/deviations: Step-through pattern, Decreased stride length, Trunk flexed Gait velocity: decreased     General Gait Details: forward flexed  posture. complaining of L hip catching throughout. Standing rest break after 30'   Stairs             Wheelchair Mobility     Tilt Bed    Modified Rankin (Stroke Patients Only)       Balance Overall balance assessment: Needs assistance Sitting-balance support: No upper extremity supported, Feet supported Sitting balance-Leahy Scale: Good     Standing balance support: Bilateral upper extremity supported Standing balance-Leahy Scale: Fair                              Hotel manager: No apparent difficulties  Cognition Arousal: Alert Behavior During Therapy: WFL for tasks assessed/performed   PT - Cognitive impairments: No apparent impairments                         Following commands: Intact      Cueing Cueing Techniques: Verbal cues  Exercises      General Comments        Pertinent Vitals/Pain      Home Living                          Prior Function            PT Goals (current goals can now be found in the care plan section) Acute Rehab PT Goals Patient Stated Goal: to return home PT Goal Formulation: With patient Time For Goal Achievement: 01/29/24 Potential to Achieve Goals: Good Progress towards PT goals: Progressing toward  goals    Frequency    Min 2X/week      PT Plan      Co-evaluation              AM-PAC PT "6 Clicks" Mobility   Outcome Measure  Help needed turning from your back to your side while in a flat bed without using bedrails?: None Help needed moving from lying on your back to sitting on the side of a flat bed without using bedrails?: A Little Help needed moving to and from a bed to a chair (including a wheelchair)?: A Little Help needed standing up from a chair using your arms (e.g., wheelchair or bedside chair)?: A Little Help needed to walk in hospital room?: A Little Help needed climbing 3-5 steps with a railing? : A Little 6 Click Score:  19    End of Session   Activity Tolerance: Patient tolerated treatment well Patient left: in bed;with call bell/phone within reach Nurse Communication: Mobility status PT Visit Diagnosis: Muscle weakness (generalized) (M62.81);Difficulty in walking, not elsewhere classified (R26.2)     Time: 1330-1350 PT Time Calculation (min) (ACUTE ONLY): 20 min  Charges:    $Therapeutic Activity: 8-22 mins PT General Charges $$ ACUTE PT VISIT: 1 Visit                     Maylon Peppers, PT, DPT Physical Therapist - [facility]  [facility]    Tequlia Gonsalves A Navaya Wiatrek 01/18/2024, 1:52 PM

## 2024-01-19 DIAGNOSIS — I5033 Acute on chronic diastolic (congestive) heart failure: Secondary | ICD-10-CM | POA: Diagnosis not present

## 2024-01-19 LAB — BASIC METABOLIC PANEL
Anion gap: 7 (ref 5–15)
BUN: 27 mg/dL — ABNORMAL HIGH (ref 8–23)
CO2: 34 mmol/L — ABNORMAL HIGH (ref 22–32)
Calcium: 9.6 mg/dL (ref 8.9–10.3)
Chloride: 96 mmol/L — ABNORMAL LOW (ref 98–111)
Creatinine, Ser: 1.38 mg/dL — ABNORMAL HIGH (ref 0.61–1.24)
GFR, Estimated: 54 mL/min — ABNORMAL LOW (ref >60)
Glucose, Bld: 135 mg/dL — ABNORMAL HIGH (ref 70–99)
Potassium: 3.5 mmol/L (ref 3.5–5.1)
Sodium: 137 mmol/L (ref 135–145)

## 2024-01-19 LAB — GLUCOSE, CAPILLARY
Glucose-Capillary: 161 mg/dL — ABNORMAL HIGH (ref 70–99)
Glucose-Capillary: 228 mg/dL — ABNORMAL HIGH (ref 70–99)
Glucose-Capillary: 140 mg/dL — ABNORMAL HIGH (ref 70–99)

## 2024-01-19 MED ORDER — SPIRONOLACTONE 12.5 MG HALF TABLET
12.5000 mg | ORAL_TABLET | Freq: Every day | ORAL | Status: DC
  Administered 2024-01-19: 12.5 mg via ORAL
  Filled 2024-01-19 (×2): qty 1

## 2024-01-19 MED ORDER — BUMETANIDE 1 MG PO TABS
1.0000 mg | ORAL_TABLET | Freq: Two times a day (BID) | ORAL | Status: DC
  Administered 2024-01-19 – 2024-01-20 (×2): 1 mg via ORAL
  Filled 2024-01-19 (×4): qty 1

## 2024-01-19 MED ORDER — BUMETANIDE 1 MG PO TABS: 1.0000 mg | ORAL_TABLET | Freq: Two times a day (BID) | ORAL | Status: DC

## 2024-01-19 MED ORDER — FUROSEMIDE 10 MG/ML IJ SOLN
60.0000 mg | Freq: Once | INTRAMUSCULAR | Status: AC
  Administered 2024-01-19: 60 mg via INTRAVENOUS
  Filled 2024-01-19: qty 6

## 2024-01-19 NOTE — Progress Notes (Signed)
 Heart Failure Stewardship Pharmacy Note  PCP: Lonie Peak, PA-C PCP-Cardiologist: Dr. Corky Sing   HPI: Jason [name] is a 73 y.o. male with persistent atrial fibrillation, CAD with prior [name], heart failure with improved LVEF on last echo, s/p ICD, type II diabetes, hypertension, hyperlipidemia who presented with shortness of breath and lower extremity edema. On admission, BNP was 264.5, HS-troponin was 10, and iron parameters meet replacement parameters for CHF. Chest x-ray noted small pleural effusions.    Pertinent cardiac history: Cardiac cath in 03/2016 with severe multivessel disease. [name] in 03/2016. Echo in 07/2018 showed LVEF of 20-25% with mild LVH, moderate RV dysfunction, and moderate MR. LVEF unchanged in 08/2018. Echo in 01/2019 showed LVEF improved to 30-35%. Echo in 09/2023 with LVEF improved to 50-55%. TEE 01/17/24 showed LVEF is back down to 30-35% with mildly reduced RV function, mild-moderate MR, mild TR. A LAA thrombus was detected, so DCCV was aborted.  Pertinent Lab Values: Creatinine, Ser  Date Value Ref Range Status  01/19/2024 1.38 (H) 0.61 - 1.24 mg/dL Final   BUN  Date Value Ref Range Status  01/19/2024 27 (H) 8 - 23 mg/dL Final   Potassium  Date Value Ref Range Status  01/19/2024 3.5 3.5 - 5.1 mmol/L Final   Sodium  Date Value Ref Range Status  01/19/2024 137 135 - 145 mmol/L Final   B Natriuretic Peptide  Date Value Ref Range Status  01/13/2024 264.5 (H) 0.0 - 100.0 pg/mL Final    Comment:    Performed at [facility], [address] [place], [place] [address]   Magnesium  Date Value Ref Range Status  01/17/2024 2.0 1.7 - 2.4 mg/dL Final    Comment:    Performed at [facility], [address] [place], [place] [address]   Hgb A1c MFr Bld  Date Value Ref Range Status  10/13/2023 5.8 (H) 4.8 - 5.6 % Final    Comment:    (NOTE) Pre diabetes:          5.7%-6.4%  Diabetes:              >6.4%  Glycemic control for    <7.0% adults with diabetes    TSH  Date Value Ref Range Status  10/25/2023 0.921 0.350 - 4.500 uIU/mL Final    Comment:    Performed by a 3rd Generation assay with a functional sensitivity of <=0.01 uIU/mL. Performed at [facility], [address] [place], [place] [address]     Vital Signs:  Temp:  [97.6 F (36.4 C)-98.7 F (37.1 C)] 98.1 F (36.7 C) (03/26 1204) Pulse Rate:  [73-92] 92 (03/26 1204) Cardiac Rhythm: Atrial fibrillation;Bundle branch block (03/26 0740) Resp:  [19-20] 19 (03/26 1204) BP: (95-118)/(61-73) 95/68 (03/26 1204) SpO2:  [90 %-96 %] 95 % (03/26 1204) Weight:  [101.2 kg (223 lb 3.2 oz)] 101.2 kg (223 lb 3.2 oz) (03/26 0602)  Intake/Output Summary (Last 24 hours) at 01/19/2024 1408 Last data filed at 01/19/2024 1140 Gross per 24 hour  Intake 720 ml  Output 5075 ml  Net -4355 ml    Current Heart Failure Medications:  Loop diuretic: furosemide 60 mg IV BID Beta-Blocker: metoprolol succinate 50 mg daily ACEI/ARB/ARNI: none MRA: none SGLT2i: Farxiga 10 mg daily  Prior to admission Heart Failure Medications:  Loop diuretic: torsemide 20 mg daily Beta-Blocker: metoprolol succinate 100 mg daily ACEI/ARB/ARNI: none MRA: none SGLT2i: Farxiga 10 mg daily Other:  Assessment: 1. Acute on chronic combined systolic and diastolic heart  failure (LVEF improved from 20-25% to 50-55%)  , due to ICM. NYHA class III-IV symptoms.  -Symptoms: Reports shortness of breath has improved since admission. Orthopnea and DOE also much improved. LEE still present, though far less. -Volume: Likely still has a small amount of volume, but is ready to transition to oral diuretics. Urine output was excellent yesterday. Weight is down. Would consider torsemide 40 mg daily. -Hemodynamics: BP has been on the softer side, limiting GDMT titration. HR is 70-90. -BB: Continue metoprolol succinate 50 mg daily.  -ACEI/ARB/ARNI: BP currently too soft for ARB/ARNI addition today.  Patient reports he was doing well on Entresto in the past, however it was stopped. -MRA: Low dose spironolactone 12.5 mg daily added, will monitor BP. -SGLT2i: Continue Farxiga 10 mg daily -DCCV planned 01/17/24, however it was aborted due to LAA thrombus. Cards to attempt again in ~6 weeks.  Plan: 1) Medication changes recommended at this time: -None  2) Patient assistance: -Pending  3) Education: - Patient has been educated on current HF medications and potential additions to HF medication regimen - Patient verbalizes understanding that over the next few months, these medication doses may change and more medications may be added to optimize HF regimen - Patient has been educated on basic disease state pathophysiology and goals of therapy  Medication Assistance / Insurance Benefits Check: Does the patient have prescription insurance?    Type of insurance plan:  Does the patient qualify for medication assistance through manufacturers or grants? Pending    Outpatient Pharmacy: Prior to admission outpatient [facility]: [facility]      Please do not hesitate to reach out with questions or concerns,  Enos Fling, PharmD, CPP, BCPS Heart Failure Pharmacist  Phone - [phone] 01/19/2024 2:08 PM

## 2024-01-19 NOTE — Progress Notes (Addendum)
 Occupational Therapy Treatment Patient Details Name: Jason [name] MRN: [id number] DOB: 11-25-1950 Today's Date: 01/19/2024   History of present illness Pt is a 73 year old male presented to the ER with acute onset of worsening dyspnea over the last 3 weeks with increasing lower extremity edema, admitted with Acute on chronic diastolic CHF (congestive heart failure), hypokalemia, AKI, afib,     PMH significant for diastolic CHF, coronary artery disease, type diabetes mellitus, essential hypertension, hypothyroidism, s/p AICD, P A-fib   OT comments  Chart reviewed, pt greeted in bed reporting 5/10 knee pain but agreeable to OT tx session targeting improving functional activity tolerance in preparation for improved ADL performance. Pt is making progress towards goals, bed mobility completd with MOD I, STS with CGA, amb approx 62' with RW with CGA. Toileting completed in standing (urinal) with close supervision with RW. Pt is left in chair, all needs met. OT will continue to follow acutely.   Of note, Pt BP 84/67 after initial stand; BP taken again in sitting 98/62. After pt amb 104/62. Pt does not report dizziness throughout. Spo2 monitored throughout, >90% on RA, HR 80s bpm after mobility       If plan is discharge home, recommend the following:  A little help with walking and/or transfers;A little help with bathing/dressing/bathroom;Help with stairs or ramp for entrance;Assistance with cooking/housework;Assist for transportation   Equipment Recommendations  None recommended by OT;Other (comment) (pt has recommended equipment)    Recommendations for Other Services      Precautions / Restrictions Precautions Precautions: Fall Recall of Precautions/Restrictions: Intact Restrictions Weight Bearing Restrictions Per Provider Order: No       Mobility Bed Mobility Overal bed mobility: Modified Independent                  Transfers Overall transfer level: Needs  assistance Equipment used: Rolling walker (2 wheels) Transfers: Sit to/from Stand Sit to Stand: Contact guard assist                 Balance Overall balance assessment: Needs assistance Sitting-balance support: No upper extremity supported, Feet supported Sitting balance-Leahy Scale: Good     Standing balance support: Bilateral upper extremity supported, Reliant on assistive device for balance, During functional activity Standing balance-Leahy Scale: Fair                             ADL either performed or assessed with clinical judgement   ADL Overall ADL's : Needs assistance/impaired Eating/Feeding: Set up;Sitting                       Toilet Transfer: Contact guard assist;Ambulation;Rolling walker (2 wheels) Toilet Transfer Details (indicate cue type and reason): simulated to bedside chair Toileting- Clothing Manipulation and Hygiene: Supervision/safety;Sit to/from stand Toileting - Clothing Manipulation Details (indicate cue type and reason): urinal at bedside     Functional mobility during ADLs: Contact guard assist;Rolling walker (2 wheels) (approx 80' with RW)      Extremity/Trunk Assessment              Vision       Perception     Praxis     Communication Communication Communication: No apparent difficulties   Cognition Arousal: Alert Behavior During Therapy: WFL for tasks assessed/performed Cognition: No apparent impairments  Following commands: Intact        Cueing   Cueing Techniques: Verbal cues  Exercises Other Exercises Other Exercises: edu re: role of OT, role of rehab, safe ADL completion    Shoulder Instructions       General Comments      Pertinent Vitals/ Pain       Pain Assessment Pain Assessment: 0-10 Pain Score: 5  Pain Location: B knee pain Pain Descriptors / Indicators: Discomfort Pain Intervention(s): Limited activity within patient's tolerance,  Repositioned, Monitored during session  Home Living                                          Prior Functioning/Environment              Frequency  Min 2X/week        Progress Toward Goals  OT Goals(current goals can now be found in the care plan section)  Progress towards OT goals: Progressing toward goals  Acute Rehab OT Goals Time For Goal Achievement: 01/29/24  Plan      Co-evaluation                 AM-PAC OT "6 Clicks" Daily Activity     Outcome Measure   Help from another person eating meals?: None Help from another person taking care of personal grooming?: None Help from another person toileting, which includes using toliet, bedpan, or urinal?: None Help from another person bathing (including washing, rinsing, drying)?: A Little Help from another person to put on and taking off regular upper body clothing?: None Help from another person to put on and taking off regular lower body clothing?: A Little 6 Click Score: 22    End of Session Equipment Utilized During Treatment: Rolling walker (2 wheels);Gait belt  OT Visit Diagnosis: Other abnormalities of gait and mobility (R26.89)   Activity Tolerance Patient tolerated treatment well   Patient Left in chair;with call bell/phone within reach   Nurse Communication Mobility status (vitals)        Time: 9147-8295 OT Time Calculation (min): 21 min  Charges: OT General Charges $OT Visit: 1 Visit OT Treatments $Therapeutic Activity: 8-22 mins  Oleta Mouse, OTD OTR/L  01/19/24, 3:38 PM

## 2024-01-19 NOTE — Plan of Care (Signed)
   Problem: Coping: Goal: Ability to adjust to condition or change in health will improve Outcome: Progressing   Problem: Fluid Volume: Goal: Ability to maintain a balanced intake and output will improve Outcome: Progressing   Problem: Nutritional: Goal: Maintenance of adequate nutrition will improve Outcome: Progressing   Problem: Education: Goal: Knowledge of General Education information will improve Description: Including pain rating scale, medication(s)/side effects and non-pharmacologic comfort measures Outcome: Progressing

## 2024-01-19 NOTE — Progress Notes (Signed)
 [facility]  - [place] at [facility]   PATIENT NAME: Jason [name]    MR#:  [id number]  DATE OF BIRTH:  [birth year]  SUBJECTIVE:  Feeling fine, swelling much improved, no significant dyspnea, no chest pain  VITALS:  Blood pressure 95/68, pulse 92, temperature 98.1 F (36.7 C), resp. rate 19, height 6\' 4"  (1.93 m), weight 101.2 kg, SpO2 95%.  PHYSICAL EXAMINATION:   GENERAL:  73 y.o.-year-old patient with no acute distress.  LUNGS: Normal breath sounds bilaterally,decreased bases CARDIOVASCULAR: S1, S2 normal. No murmur   ABDOMEN: Soft, nontender, nondistended. Bowel sounds present.  EXTREMITIES: b/l LE edema much improved   NEUROLOGIC: nonfocal  patient is alert and awake  LABORATORY PANEL:  CBC Recent Labs  Lab 01/16/24 0425  WBC 4.6  HGB 9.9*  HCT 31.5*  PLT 86*    Chemistries  Recent Labs  Lab 01/17/24 0618 01/18/24 0442 01/19/24 0435  NA 141   < > 137  K 3.0*   < > 3.5  CL 100   < > 96*  CO2 34*   < > 34*  GLUCOSE 145*   < > 135*  BUN 26*   < > 27*  CREATININE 1.37*   < > 1.38*  CALCIUM 9.1   < > 9.6  MG 2.0  --   --    < > = values in this interval not displayed.    Assessment and Plan Jason [name] is a 73 y.o. Caucasian male with medical history significant for diastolic CHF, coronary artery disease, type diabetes mellitus, essential hypertension, hypothyroidism, s/p AICD, P A-fib, who presented to the ER with acute onset of worsening dyspnea over the last 3 weeks with increasing lower extremity edema.   Acute on chronic diastolic CHF (congestive heart failure) (HCC) H/o CAD s/p 4v [name]  Left atrial Thrombus  --Patient is diuresed with IV lasix. Remains hypervolemic.  --Marcelline Deist, and toprol XL --Daily weights and I/O--good uop --[facility] Cardiology following --  TEE showed reduced LVEF of 30 to 35%, left atrial appendage thrombus noted. Currently he is on Eliquis for anticoagulation.--now pt will be on po Pradaxa --starting bumex this  evening    Thrombocytopenia  --Decreased from day prior. 90k. No bleeding. No HIV/HCV. Smear with normal platelet morphology.  --F/u vitamin studies --B12 on lower end of normal. Will start B12   Hypokalemia Resolved    AKI (acute kidney injury) (HCC) --Likely cardiorenal.  --Will continue to monitor with IV diuresis    Atrial fibrillation, chronic (HCC) --Continue amiodarone, Toprol-XL and Pradaxa   Type 2 diabetes mellitus with peripheral neuropathy (HCC)  --A1c 5.8. Well controlled.  --Continue SSI + Farxiga   GERD without esophagitis - Continue PPI therapy.   Dyslipidemia - Continue statin therapy.   Procedures:TEE Family communication : none today Consults : [facility]ology CODE STATUS: full DVT Prophylaxis :Pradaxa Level of care: Telemetry Cardiac Status is: Inpatient Remains inpatient appropriate because: monitor while transition to orals   Silvano Bilis M.D    [facility]   CC: Primary care physician; Lonie Peak, PA-C

## 2024-01-19 NOTE — Progress Notes (Signed)
 [facility] CLINIC CARDIOLOGY PROGRESS NOTE   Patient ID: Jason [name] MRN: [id number] DOB/AGE: 74-Mar-1952 74 y.o.  Admit date: 01/13/2024 Referring Physician Dr. Ashok Pall Primary Physician Lonie Peak, PA-C  Primary Cardiologist Dr. Corky Sing Reason for Consultation AoCHF  HPI: Jason [name] is a 73 y.o. male with a past medical history of persistent atrial fibrillation, CAD with prior [name], heart failure with reduced EF with improved LVEF on last echo, s/p ICD, diabetes, hypertension, hyperlipidemia who presented to the ED on 01/13/2024 for worsening SOB and LE edema.   Interval History:  -Patient seen and examined this AM, resting comfortably in bed.  -LE edema improved overall, only minimal edema. -SOB improved, on room air.  -Renal function relatively stable and tolerating diuresis well.   Review of systems complete and found to be negative unless listed above    Vitals:   01/18/24 2359 01/19/24 0403 01/19/24 0602 01/19/24 0858  BP: 100/73 109/69  111/62  Pulse: 73 83  80  Resp:    20  Temp: 98.7 F (37.1 C) 97.8 F (36.6 C)  97.9 F (36.6 C)  TempSrc: Oral Oral  Oral  SpO2: 90% 91%  94%  Weight:   101.2 kg   Height:         Intake/Output Summary (Last 24 hours) at 01/19/2024 0935 Last data filed at 01/19/2024 0602 Gross per 24 hour  Intake 720 ml  Output 4675 ml  Net -3955 ml     PHYSICAL EXAM General: Chronically-ill appearing male\, well nourished, in no acute distress. HEENT: Normocephalic and atraumatic. Neck: No JVD.  Lungs: Normal respiratory effort on room air. Clear bilaterally to auscultation. No wheezes, crackles, rhonchi.  Heart: Irregularly irregular, controlled rate. Normal S1 and S2 without gallops or murmurs. Radial & DP pulses 2+ bilaterally. Abdomen: Non-distended appearing.  Msk: Normal strength and tone for age. Extremities: No clubbing, cyanosis or edema.   Neuro: Alert and oriented X 3. Psych: Mood appropriate, affect congruent.     LABS: Basic Metabolic Panel: Recent Labs    01/17/24 0618 01/18/24 0442 01/19/24 0435  NA 141 141 137  K 3.0* 3.9 3.5  CL 100 98 96*  CO2 34* 34* 34*  GLUCOSE 145* 119* 135*  BUN 26* 22 27*  CREATININE 1.37* 1.25* 1.38*  CALCIUM 9.1 9.5 9.6  MG 2.0  --   --    Liver Function Tests: No results for input(s): "AST", "ALT", "ALKPHOS", "BILITOT", "PROT", "ALBUMIN" in the last 72 hours. No results for input(s): "LIPASE", "AMYLASE" in the last 72 hours. CBC: No results for input(s): "WBC", "NEUTROABS", "HGB", "HCT", "MCV", "PLT" in the last 72 hours.  Cardiac Enzymes: No results for input(s): "CKTOTAL", "CKMB", "CKMBINDEX", "TROPONINIHS" in the last 72 hours. BNP: No results for input(s): "BNP" in the last 72 hours. D-Dimer: No results for input(s): "DDIMER" in the last 72 hours. Hemoglobin A1C: No results for input(s): "HGBA1C" in the last 72 hours. Fasting Lipid Panel: No results for input(s): "CHOL", "HDL", "LDLCALC", "TRIG", "CHOLHDL", "LDLDIRECT" in the last 72 hours. Thyroid Function Tests: No results for input(s): "TSH", "T4TOTAL", "T3FREE", "THYROIDAB" in the last 72 hours.  Invalid input(s): "FREET3" Anemia Panel: No results for input(s): "VITAMINB12", "FOLATE", "FERRITIN", "TIBC", "IRON", "RETICCTPCT" in the last 72 hours.   ECHO TEE Result Date: 01/17/2024    TRANSESOPHOGEAL ECHO REPORT   Patient Name:   Jason [name] Date of Exam: 01/17/2024 Medical Rec #:  [id number]      Height:  76.0 in Accession #:    [id number]     Weight:       235.8 lb Date of Birth:  03-30-1951       BSA:          2.376 m Patient Age:    72 years       BP:           103/63 mmHg Patient Gender: M              HR:           83 bpm. Exam Location:  [facility] Procedure: Transesophageal Echo, Cardiac Doppler and Color Doppler (Both            Spectral and Color Flow Doppler were utilized during procedure). Indications:     Not listed on TEE check-in sheet  History:         Patient has prior  history of Echocardiogram examinations, most                  recent 10/26/2023.  Sonographer:     Cristela Blue Referring Phys:  [id number] Waverly Tarquinio Diagnosing Phys: Windell Norfolk PROCEDURE: After discussion of the risks and benefits of a TEE, an informed consent was obtained from the patient. The transesophogeal probe was passed without difficulty through the esophogus of the patient. Sedation performed by different physician. The patient was monitored while under deep sedation. Image quality was excellent. The patient's vital signs; including heart rate, blood pressure, and oxygen saturation; remained stable throughout the procedure. The patient developed no complications during the procedure.  IMPRESSIONS  1. Left ventricular ejection fraction, by estimation, is 30 to 35%. The left ventricle has moderate to severely decreased function.  2. Right ventricular systolic function is mildly reduced. The right ventricular size is normal.  3. Left atrial size was dilated. A left atrial/left atrial appendage thrombus was detected.  4. Right atrial size was dilated. paced lead.  5. The mitral valve is normal in structure. Mild to moderate mitral valve regurgitation.  6. The aortic valve is tricuspid. There is mild thickening of the aortic valve. Aortic valve regurgitation is not visualized. FINDINGS  Left Ventricle: Left ventricular ejection fraction, by estimation, is 30 to 35%. The left ventricle has moderate to severely decreased function. The left ventricular internal cavity size was normal in size. Right Ventricle: The right ventricular size is normal. No increase in right ventricular wall thickness. Right ventricular systolic function is mildly reduced. Left Atrium: Left atrial size was dilated. A left atrial/left atrial appendage thrombus was detected. Right Atrium: Right atrial size was dilated. Paced lead. Pericardium: There is no evidence of pericardial effusion. Mitral Valve: The mitral valve is normal in  structure. Mild to moderate mitral valve regurgitation. Tricuspid Valve: The tricuspid valve is normal in structure. Tricuspid valve regurgitation is mild. Aortic Valve: The aortic valve is tricuspid. There is mild thickening of the aortic valve. Aortic valve regurgitation is not visualized. Pulmonic Valve: The pulmonic valve was normal in structure. Pulmonic valve regurgitation is trivial. Aorta: The aortic root is normal in size and structure. IAS/Shunts: No atrial level shunt detected by color flow Doppler. Windell Norfolk Electronically signed by Windell Norfolk Signature Date/Time: 01/17/2024/5:20:16 PM    Final      ECHO as abvove  TELEMETRY reviewed by me 01/19/24: atrial fibrillation rate 80s  EKG reviewed by me 01/19/24: atrial fibrillation rate 75 bpm  DATA reviewed by me 01/19/24: last 24h vitals tele labs imaging I/O,  hospitalist progress note  Principal Problem:   Acute on chronic diastolic CHF (congestive heart failure) (HCC) Active Problems:   Essential hypertension   S/P [name]   Atrial fibrillation (HCC)   Hypothyroidism   Hypokalemia   CAD (coronary artery disease)   Atrial fibrillation, chronic (HCC)   Type 2 diabetes mellitus with peripheral neuropathy (HCC)   Dyslipidemia   GERD without esophagitis   AKI (acute kidney injury) (HCC)   AICD (automatic cardioverter/defibrillator) present    ASSESSMENT AND PLAN: Jason [name] is a 73 y.o. male with a past medical history of persistent atrial fibrillation, CAD with prior [name], heart failure with reduced EF with improved LVEF on last echo, s/p ICD, diabetes, hypertension, hyperlipidemia who presented to the ED on 01/13/2024 for worsening SOB and LE edema.   # Atrial fibrillation RVR # Persistent atrial fibrillation # Acute on chronic HFrEF  # Coronary artery disease s/p [name] Patient sent to ED from our clinic due to significant LE edema and worsening SOB. BNP mildly elevated. Started on IV diuresis and is now net  negative 9.2L. Remains in atrial fibrillation but rate has improved.  -Continue amiodarone 100 mg daily, metoprolol succinate 50 mg daily.  -Continue Pradaxa 150 mg twice daily. -Will 1 additional dose of IV Lasix 60 mg this a.m.  Plan to start Bumex 1 mg twice daily this evening. -Continue Farxiga 10 mg daily.  Consider addition of ARB tomorrow pending BP and renal function. -Continue rosuvastatin 20 mg daily. Has documented aspirin allergy.   This patient's case was discussed and created with Dr. Corky Sing and he is in agreement.  Signed:  Gale Journey, PA-C  01/19/2024, 9:35 AM [facility]

## 2024-01-20 ENCOUNTER — Other Ambulatory Visit: Payer: Self-pay

## 2024-01-20 DIAGNOSIS — I5033 Acute on chronic diastolic (congestive) heart failure: Secondary | ICD-10-CM | POA: Diagnosis not present

## 2024-01-20 LAB — BASIC METABOLIC PANEL WITH GFR
Anion gap: 12 (ref 5–15)
BUN: 26 mg/dL — ABNORMAL HIGH (ref 8–23)
CO2: 33 mmol/L — ABNORMAL HIGH (ref 22–32)
Calcium: 9.5 mg/dL (ref 8.9–10.3)
Chloride: 91 mmol/L — ABNORMAL LOW (ref 98–111)
Creatinine, Ser: 1.44 mg/dL — ABNORMAL HIGH (ref 0.61–1.24)
GFR, Estimated: 51 mL/min — ABNORMAL LOW (ref >60)
Glucose, Bld: 126 mg/dL — ABNORMAL HIGH (ref 70–99)
Potassium: 3.2 mmol/L — ABNORMAL LOW (ref 3.5–5.1)
Sodium: 136 mmol/L (ref 135–145)

## 2024-01-20 LAB — GLUCOSE, CAPILLARY
Glucose-Capillary: 271 mg/dL — ABNORMAL HIGH (ref 70–99)
Glucose-Capillary: 131 mg/dL — ABNORMAL HIGH (ref 70–99)

## 2024-01-20 MED ORDER — METOPROLOL SUCCINATE ER 100 MG PO TB24: 50.0000 mg | ORAL_TABLET | Freq: Every day | ORAL | Status: AC

## 2024-01-20 MED ORDER — DABIGATRAN ETEXILATE MESYLATE 150 MG PO CAPS
150.0000 mg | ORAL_CAPSULE | Freq: Two times a day (BID) | ORAL | 1 refills | Status: AC
  Filled 2024-01-20: qty 60, 30d supply, fill #0

## 2024-01-20 MED ORDER — ORAL CARE MOUTH RINSE: 15.0000 mL | OROMUCOSAL | Status: DC | PRN

## 2024-01-20 MED ORDER — SPIRONOLACTONE 25 MG PO TABS
25.0000 mg | ORAL_TABLET | Freq: Every day | ORAL | 1 refills | Status: AC
  Filled 2024-01-20: qty 30, 30d supply, fill #0

## 2024-01-20 MED ORDER — SPIRONOLACTONE 25 MG PO TABS
25.0000 mg | ORAL_TABLET | Freq: Every day | ORAL | Status: DC
  Administered 2024-01-20: 25 mg via ORAL
  Filled 2024-01-20: qty 1

## 2024-01-20 MED ORDER — BUMETANIDE 1 MG PO TABS
1.0000 mg | ORAL_TABLET | Freq: Two times a day (BID) | ORAL | 1 refills | Status: AC
  Filled 2024-01-20: qty 60, 30d supply, fill #0

## 2024-01-20 NOTE — Discharge Summary (Signed)
 DEMITRUS FRANCISCO [name]:[id number] DOB: 04/29/1951 DOA: 01/13/2024  PCP: Lonie Peak, PA-C  Admit date: 01/13/2024 Discharge date: 01/20/2024  Time spent: 35 minutes  Recommendations for Outpatient Follow-up:  Pcp f/u Cardiology f/u 1-2 weeks     Discharge Diagnoses:  Principal Problem:   Acute on chronic diastolic CHF (congestive heart failure) (HCC) Active Problems:   Hypokalemia   CAD (coronary artery disease)   Atrial fibrillation, chronic (HCC)   AKI (acute kidney injury) (HCC)   Essential hypertension   Atrial fibrillation (HCC)   Hypothyroidism   Type 2 diabetes mellitus with peripheral neuropathy (HCC)   S/P [name]   Dyslipidemia   GERD without esophagitis   AICD (automatic cardioverter/defibrillator) present   Discharge Condition: improved  Diet recommendation: heart healthy  Filed Weights   01/18/24 0500 01/19/24 0602 01/20/24 0503  Weight: 106.8 kg 101.2 kg 98.9 kg    History of present illness:  From admission h and p  Jason [name] is a 73 y.o. Caucasian male with medical history significant for diastolic CHF, coronary artery disease, type diabetes mellitus, essential hypertension, hypothyroidism, s/p AICD, P A-fib, who presented to the ER with acute onset of worsening dyspnea over the last 3 weeks with increasing lower extremity edema.  He admitted to orthopnea for the last couple weeks and has been sleeping in a recliner.  No cough or wheezing.  No chest pain or palpitations.  No fever or chills.  No nausea or vomiting or abdominal pain.  He has been compliant with his diuretic therapy.  No dysuria, oliguria or hematuria or flank pain.  He was seen earlier by his cardiologist during the day who noted he was in atrial fibrillation and advised him to come to the ED.   Hospital Course:  Patient presents with dyspnea and lower extremity edema. Found to have acute on chronic systolic heart failure with reduced EF. Treated with IV diuresis and adjustment of GDMT.  TEE showed EF of 30-35 with left atrial appendage. Cardiology switched apixaban to pradaxa. AKI likely cardiorenal, improved with diuresis. Cardiology followed while here. Now stable for discharge with outpt cardiology f/u 1-2 weeks. Close PCP f/u also advised. Home health PT/OT/RN ordered.  Procedures: TEE   Consultations: cardiology  Discharge Exam: Vitals:   01/20/24 0355 01/20/24 0756  BP: 106/61 97/62  Pulse: 79 88  Resp: 16 16  Temp: 98 F (36.7 C) (!) 97.4 F (36.3 C)  SpO2: 96% 92%    General: NAD Cardiovascular: RRR soft systolic murmur Respiratory: CTAB normal WOB Ext: pitting edema to knees much improved from prior  Discharge Instructions   Discharge Instructions     AMB referral to CHF clinic   Complete by: As directed    Reason for referral: Systolic HF   Ambulatory referral to Cardiology   Complete by: As directed    Diet - low sodium heart healthy   Complete by: As directed    Increase activity slowly   Complete by: As directed       Allergies as of 01/20/2024       Reactions   Aspirin Anaphylaxis   Inderal [propranolol]    Other reaction(s): Other (See Comments) Fatigue   Metformin Hcl Diarrhea   Pt still takes medication   Mevacor [lovastatin] Other (See Comments)   muscle Pain   Penicillin G Hives   Pravachol [pravastatin Sodium] Other (See Comments)   Muscle Pain   Zocor [simvastatin] Other (See Comments)   Muscle Pain  Penicillins Rash   Has patient had a PCN reaction causing immediate rash, facial/tongue/throat swelling, SOB or lightheadedness with hypotension: Yes Has patient had a PCN reaction causing severe rash involving mucus membranes or skin necrosis: Yes Has patient had a PCN reaction that required hospitalization No Has patient had a PCN reaction occurring within the last 10 years: Yes If all of the above answers are "NO", then may proceed with Cephalosporin use.        Medication List     STOP taking these  medications    Eliquis 5 MG Tabs tablet Generic drug: apixaban   torsemide 20 MG tablet Commonly known as: DEMADEX       TAKE these medications    acetaminophen 500 MG tablet Commonly known as: TYLENOL Take 500-1,000 mg by mouth every 6 (six) hours as needed (pain.).   amiodarone 200 MG tablet Commonly known as: PACERONE Take 100 mg by mouth in the morning.   bumetanide 1 MG tablet Commonly known as: BUMEX Take 1 tablet (1 mg total) by mouth 2 (two) times daily.   dabigatran 150 MG Caps capsule Commonly known as: PRADAXA Take 1 capsule (150 mg total) by mouth every 12 (twelve) hours.   Ensure Max Protein Liqd Take 330 mLs (11 oz total) by mouth 2 (two) times daily.   Farxiga 10 MG Tabs tablet Generic drug: dapagliflozin propanediol Take 10 mg by mouth in the morning.   gabapentin 100 MG capsule Commonly known as: NEURONTIN Take 100 mg by mouth at bedtime as needed (nerve pain.).   Gerhardt's butt cream Crea Apply 1 Application topically 2 (two) times daily.   levothyroxine 50 MCG tablet Commonly known as: SYNTHROID Take 50 mcg by mouth daily at 2 am.   melatonin 5 MG Tabs Take 1 tablet (5 mg total) by mouth at bedtime.   metoprolol succinate 100 MG 24 hr tablet Commonly known as: TOPROL-XL Take 0.5 tablets (50 mg total) by mouth daily. What changed: how much to take   pantoprazole 40 MG tablet Commonly known as: PROTONIX Take 1 tablet (40 mg total) by mouth daily.   rosuvastatin 40 MG tablet Commonly known as: CRESTOR Take 40 mg by mouth at bedtime.   senna-docusate 8.6-50 MG tablet Commonly known as: Senokot-S Take 1 tablet by mouth at bedtime as needed for moderate constipation.   spironolactone 25 MG tablet Commonly known as: ALDACTONE Take 1 tablet (25 mg total) by mouth daily. Start taking on: January 21, 2024       Allergies  Allergen Reactions   Aspirin Anaphylaxis   Inderal [Propranolol]     Other reaction(s): Other (See  Comments) Fatigue   Metformin Hcl Diarrhea    Pt still takes medication   Mevacor [Lovastatin] Other (See Comments)    muscle Pain   Penicillin G Hives   Pravachol [Pravastatin Sodium] Other (See Comments)    Muscle Pain   Zocor [Simvastatin] Other (See Comments)    Muscle Pain   Penicillins Rash    Has patient had a PCN reaction causing immediate rash, facial/tongue/throat swelling, SOB or lightheadedness with hypotension: Yes Has patient had a PCN reaction causing severe rash involving mucus membranes or skin necrosis: Yes Has patient had a PCN reaction that required hospitalization No Has patient had a PCN reaction occurring within the last 10 years: Yes If all of the above answers are "NO", then may proceed with Cephalosporin use.     Follow-up Information     Alluri, Meryl Dare, MD.  Go in 1 week(s).   Specialty: Cardiology Contact information: [address] [place] [address] [phone]         Lonie Peak, PA-C Follow up.   Specialty: Physician Assistant Contact information: [address] [place] [address] [phone]                  The results of significant diagnostics from this hospitalization (including imaging, microbiology, ancillary and laboratory) are listed below for reference.    Significant Diagnostic Studies: ECHO TEE Result Date: 01/17/2024    TRANSESOPHOGEAL ECHO REPORT   Patient Name:   Jason [name] Date of Exam: 01/17/2024 Medical Rec #:  [id number]      Height:       76.0 in Accession #:    [id number]     Weight:       235.8 lb Date of Birth:  1951/04/29       BSA:          2.376 m Patient Age:    72 years       BP:           103/63 mmHg Patient Gender: M              HR:           83 bpm. Exam Location:  [facility] Procedure: Transesophageal Echo, Cardiac Doppler and Color Doppler (Both            Spectral and Color Flow Doppler were utilized during procedure). Indications:     Not listed on TEE check-in sheet   History:         Patient has prior history of Echocardiogram examinations, most                  recent 10/26/2023.  Sonographer:     Cristela Blue Referring Phys:  [id number] CARALYN HUDSON Diagnosing Phys: Windell Norfolk PROCEDURE: After discussion of the risks and benefits of a TEE, an informed consent was obtained from the patient. The transesophogeal probe was passed without difficulty through the esophogus of the patient. Sedation performed by different physician. The patient was monitored while under deep sedation. Image quality was excellent. The patient's vital signs; including heart rate, blood pressure, and oxygen saturation; remained stable throughout the procedure. The patient developed no complications during the procedure.  IMPRESSIONS  1. Left ventricular ejection fraction, by estimation, is 30 to 35%. The left ventricle has moderate to severely decreased function.  2. Right ventricular systolic function is mildly reduced. The right ventricular size is normal.  3. Left atrial size was dilated. A left atrial/left atrial appendage thrombus was detected.  4. Right atrial size was dilated. paced lead.  5. The mitral valve is normal in structure. Mild to moderate mitral valve regurgitation.  6. The aortic valve is tricuspid. There is mild thickening of the aortic valve. Aortic valve regurgitation is not visualized. FINDINGS  Left Ventricle: Left ventricular ejection fraction, by estimation, is 30 to 35%. The left ventricle has moderate to severely decreased function. The left ventricular internal cavity size was normal in size. Right Ventricle: The right ventricular size is normal. No increase in right ventricular wall thickness. Right ventricular systolic function is mildly reduced. Left Atrium: Left atrial size was dilated. A left atrial/left atrial appendage thrombus was detected. Right Atrium: Right atrial size was dilated. Paced lead. Pericardium: There is no evidence of pericardial effusion. Mitral Valve:  The mitral valve is normal in structure. Mild to moderate  mitral valve regurgitation. Tricuspid Valve: The tricuspid valve is normal in structure. Tricuspid valve regurgitation is mild. Aortic Valve: The aortic valve is tricuspid. There is mild thickening of the aortic valve. Aortic valve regurgitation is not visualized. Pulmonic Valve: The pulmonic valve was normal in structure. Pulmonic valve regurgitation is trivial. Aorta: The aortic root is normal in size and structure. IAS/Shunts: No atrial level shunt detected by color flow Doppler. Windell Norfolk Electronically signed by Windell Norfolk Signature Date/Time: 01/17/2024/5:20:16 PM    Final    DG Chest 2 View Result Date: 01/13/2024 CLINICAL DATA:  Shortness of breath. EXAM: CHEST - 2 VIEW COMPARISON:  October 24, 2023. FINDINGS: Stable cardiomediastinal silhouette. Left-sided defibrillator is unchanged. Status post coronary artery bypass graft. Minimal bibasilar subsegmental atelectasis is noted with small pleural effusions. Bony thorax is unremarkable. IMPRESSION: Minimal bibasilar subsegmental atelectasis is noted with small pleural effusions. Electronically Signed   By: Lupita Raider M.D.   On: 01/13/2024 16:07    Microbiology: Recent Results (from the past 240 hours)  Resp panel by RT-PCR (RSV, Flu A&B, Covid) Anterior Nasal Swab     Status: None   Collection Time: 01/14/24 10:38 AM   Specimen: Anterior Nasal Swab  Result Value Ref Range Status   SARS Coronavirus 2 by RT PCR NEGATIVE NEGATIVE Final    Comment: (NOTE) SARS-CoV-2 target nucleic acids are NOT DETECTED.  The SARS-CoV-2 RNA is generally detectable in upper respiratory specimens during the acute phase of infection. The lowest concentration of SARS-CoV-2 viral copies this assay can detect is 138 copies/mL. A negative result does not preclude SARS-Cov-2 infection and should not be used as the sole basis for treatment or other patient management decisions. A negative result  may occur with  improper specimen collection/handling, submission of specimen other than nasopharyngeal swab, presence of viral mutation(s) within the areas targeted by this assay, and inadequate number of viral copies(<138 copies/mL). A negative result must be combined with clinical observations, patient history, and epidemiological information. The expected result is Negative.  Fact Sheet for Patients:  BloggerCourse.com  Fact Sheet for Healthcare Providers:  SeriousBroker.it  This test is no t yet approved or cleared by the [place] FDA and  has been authorized for detection and/or diagnosis of SARS-CoV-2 by FDA under an Emergency Use Authorization (EUA). This EUA will remain  in effect (meaning this test can be used) for the duration of the COVID-19 declaration under Section 564(b)(1) of the Act, 21 U.S.C.section 360bbb-3(b)(1), unless the authorization is terminated  or revoked sooner.       Influenza A by PCR NEGATIVE NEGATIVE Final   Influenza B by PCR NEGATIVE NEGATIVE Final    Comment: (NOTE) The Xpert Xpress SARS-CoV-2/FLU/RSV plus assay is intended as an aid in the diagnosis of influenza from Nasopharyngeal swab specimens and should not be used as a sole basis for treatment. Nasal washings and aspirates are unacceptable for Xpert Xpress SARS-CoV-2/FLU/RSV testing.  Fact Sheet for Patients: BloggerCourse.com  Fact Sheet for Healthcare Providers: SeriousBroker.it  This test is not yet approved or cleared by the [place] FDA and has been authorized for detection and/or diagnosis of SARS-CoV-2 by FDA under an Emergency Use Authorization (EUA). This EUA will remain in effect (meaning this test can be used) for the duration of the COVID-19 declaration under Section 564(b)(1) of the Act, 21 U.S.C. section 360bbb-3(b)(1), unless the authorization is terminated  or revoked.     Resp Syncytial Virus by PCR NEGATIVE NEGATIVE Final  Comment: (NOTE) Fact Sheet for Patients: BloggerCourse.com  Fact Sheet for Healthcare Providers: SeriousBroker.it  This test is not yet approved or cleared by the [place] FDA and has been authorized for detection and/or diagnosis of SARS-CoV-2 by FDA under an Emergency Use Authorization (EUA). This EUA will remain in effect (meaning this test can be used) for the duration of the COVID-19 declaration under Section 564(b)(1) of the Act, 21 U.S.C. section 360bbb-3(b)(1), unless the authorization is terminated or revoked.  Performed at [facility], [address] [place], [place] [address]      Labs: Basic Metabolic Panel: Recent Labs  Lab 01/13/24 1453 01/14/24 0500 01/15/24 0441 01/16/24 0425 01/17/24 0618 01/18/24 0442 01/19/24 0435 01/20/24 0403  NA 139   < > 142 140 141 141 137 136  K 2.9*   < > 3.7 3.3* 3.0* 3.9 3.5 3.2*  CL 100   < > 103 100 100 98 96* 91*  CO2 32   < > 31 32 34* 34* 34* 33*  GLUCOSE 155*   < > 137* 143* 145* 119* 135* 126*  BUN 18   < > 18 19 26* 22 27* 26*  CREATININE 1.39*   < > 1.34* 1.28* 1.37* 1.25* 1.38* 1.44*  CALCIUM 9.0   < > 9.4 9.2 9.1 9.5 9.6 9.5  MG 2.1  --  2.0 2.1 2.0  --   --   --    < > = values in this interval not displayed.   Liver Function Tests: No results for input(s): "AST", "ALT", "ALKPHOS", "BILITOT", "PROT", "ALBUMIN" in the last 168 hours. No results for input(s): "LIPASE", "AMYLASE" in the last 168 hours. No results for input(s): "AMMONIA" in the last 168 hours. CBC: Recent Labs  Lab 01/13/24 1453 01/14/24 0500 01/15/24 0441 01/16/24 0425  WBC 4.7 4.2  4.2 4.4 4.6  NEUTROABS  --  2.5  --   --   HGB 9.9* 10.1*  10.0* 9.7* 9.9*  HCT 32.2* 33.5*  32.3* 32.0* 31.5*  MCV 92.5 93.3  90.5 93.0 89.5  PLT 115* 102*  103* 90* 86*   Cardiac Enzymes: No results for  input(s): "CKTOTAL", "CKMB", "CKMBINDEX", "TROPONINI" in the last 168 hours. BNP: BNP (last 3 results) Recent Labs    10/25/23 0204 12/08/23 1559 01/13/24 1453  BNP 463.1* 343.6* 264.5*    ProBNP (last 3 results) No results for input(s): "PROBNP" in the last 8760 hours.  CBG: Recent Labs  Lab 01/19/24 0824 01/19/24 1143 01/19/24 1643 01/19/24 2022 01/20/24 0755  GLUCAP 161* 228* 271* 140* 131*       Signed:  Silvano Bilis MD.  [facility] 01/20/2024, 10:01 AM

## 2024-01-20 NOTE — Progress Notes (Signed)
 [facility] CLINIC CARDIOLOGY PROGRESS NOTE   Patient ID: Jason [name] MRN: [id number] DOB/AGE: Jan 31, 1951 73 y.o.  Admit date: 01/13/2024 Referring Physician Dr. Ashok Pall Primary Physician Lonie Peak, PA-C  Primary Cardiologist Dr. Corky Sing Reason for Consultation AoCHF  HPI: Jason [name] is a 73 y.o. male with a past medical history of persistent atrial fibrillation, CAD with prior [name], heart failure with reduced EF with improved LVEF on last echo, s/p ICD, diabetes, hypertension, hyperlipidemia who presented to the ED on 01/13/2024 for worsening SOB and LE edema.   Interval History:  -Patient seen and examined this AM, resting comfortably in bed.  -LE edema significantly improved.  -SOB improved, on room air.  -Renal function relatively stable and tolerating diuresis well.   Review of systems complete and found to be negative unless listed above    Vitals:   01/19/24 2319 01/20/24 0355 01/20/24 0503 01/20/24 0756  BP: (!) 103/59 106/61  97/62  Pulse: 79 79  88  Resp: 16 16  16   Temp: 97.9 F (36.6 C) 98 F (36.7 C)  (!) 97.4 F (36.3 C)  TempSrc:  Oral    SpO2: 95% 96%  92%  Weight:   98.9 kg   Height:         Intake/Output Summary (Last 24 hours) at 01/20/2024 7829 Last data filed at 01/20/2024 0756 Gross per 24 hour  Intake 240 ml  Output 3500 ml  Net -3260 ml     PHYSICAL EXAM General: Chronically-ill appearing male, well nourished, in no acute distress. HEENT: Normocephalic and atraumatic. Neck: No JVD.  Lungs: Normal respiratory effort on room air. Clear bilaterally to auscultation. No wheezes, crackles, rhonchi.  Heart: Irregularly irregular, controlled rate. Normal S1 and S2 without gallops or murmurs. Radial & DP pulses 2+ bilaterally. Abdomen: Non-distended appearing.  Msk: Normal strength and tone for age. Extremities: No clubbing, cyanosis or edema.   Neuro: Alert and oriented X 3. Psych: Mood appropriate, affect congruent.    LABS: Basic  Metabolic Panel: Recent Labs    01/19/24 0435 01/20/24 0403  NA 137 136  K 3.5 3.2*  CL 96* 91*  CO2 34* 33*  GLUCOSE 135* 126*  BUN 27* 26*  CREATININE 1.38* 1.44*  CALCIUM 9.6 9.5   Liver Function Tests: No results for input(s): "AST", "ALT", "ALKPHOS", "BILITOT", "PROT", "ALBUMIN" in the last 72 hours. No results for input(s): "LIPASE", "AMYLASE" in the last 72 hours. CBC: No results for input(s): "WBC", "NEUTROABS", "HGB", "HCT", "MCV", "PLT" in the last 72 hours.  Cardiac Enzymes: No results for input(s): "CKTOTAL", "CKMB", "CKMBINDEX", "TROPONINIHS" in the last 72 hours. BNP: No results for input(s): "BNP" in the last 72 hours. D-Dimer: No results for input(s): "DDIMER" in the last 72 hours. Hemoglobin A1C: No results for input(s): "HGBA1C" in the last 72 hours. Fasting Lipid Panel: No results for input(s): "CHOL", "HDL", "LDLCALC", "TRIG", "CHOLHDL", "LDLDIRECT" in the last 72 hours. Thyroid Function Tests: No results for input(s): "TSH", "T4TOTAL", "T3FREE", "THYROIDAB" in the last 72 hours.  Invalid input(s): "FREET3" Anemia Panel: No results for input(s): "VITAMINB12", "FOLATE", "FERRITIN", "TIBC", "IRON", "RETICCTPCT" in the last 72 hours.   No results found.    ECHO as abvove  TELEMETRY reviewed by me 01/20/24: atrial fibrillation rate 80s  EKG reviewed by me 01/20/24: atrial fibrillation rate 75 bpm  DATA reviewed by me 01/20/24: last 24h vitals tele labs imaging I/O, hospitalist progress note  Principal Problem:   Acute on chronic diastolic CHF (congestive heart failure) (  HCC) Active Problems:   Essential hypertension   S/P [name]   Atrial fibrillation (HCC)   Hypothyroidism   Hypokalemia   CAD (coronary artery disease)   Atrial fibrillation, chronic (HCC)   Type 2 diabetes mellitus with peripheral neuropathy (HCC)   Dyslipidemia   GERD without esophagitis   AKI (acute kidney injury) (HCC)   AICD (automatic cardioverter/defibrillator)  present    ASSESSMENT AND PLAN: [name] is a 73 y.o. male with a past medical history of persistent atrial fibrillation, CAD with prior [name], heart failure with reduced EF with improved LVEF on last echo, s/p ICD, diabetes, hypertension, hyperlipidemia who presented to the ED on 01/13/2024 for worsening SOB and LE edema.   # Atrial fibrillation RVR # Persistent atrial fibrillation # Acute on chronic HFrEF  # Coronary artery disease s/p [name] Patient sent to ED from our clinic due to significant LE edema and worsening SOB. BNP mildly elevated. Started on IV diuresis and is now net negative 9.2L. Remains in atrial fibrillation but rate has improved.  -Continue amiodarone 100 mg daily, metoprolol succinate 50 mg daily.  -Continue Pradaxa 150 mg twice daily. -Continue Bumex 1 mg twice daily. -Increase spironolactone to 25 mg daily. Continue Farxiga 10 mg daily.  Defer addition of arb.  -Continue rosuvastatin 20 mg daily. Has documented aspirin allergy.   Ok for discharge today from a cardiac perspective. Will arrange for follow up in clinic with Dr. Corky Sing in 1-2 weeks.    This patient's case was discussed and created with Dr. Corky Sing and he is in agreement.  Signed:  Gale Journey, PA-C  01/20/2024, 9:22 AM [facility] Cardiology

## 2024-01-20 NOTE — Care Management Important Message (Signed)
 Important Message  Patient Details  Name: Jason [name] MRN: [id number] Date of Birth: 12-16-1950   Important Message Given:  Yes - Medicare IM     Keishla Oyer, Stephan Minister 01/20/2024, 2:27 PM

## 2024-01-20 NOTE — TOC Transition Note (Signed)
 Transition of Care [facility]) - Discharge Note   Patient Details  Name: Jason [name] MRN: [id number] Date of Birth: 1951-08-12  Transition of Care [facility]) CM/SW Contact:  Truddie Hidden, RN Phone Number: 01/20/2024, 11:34 AM   Clinical Narrative:    Patient discharging home.  Spoke with patient's wife she will transport patient. Patient advised [facility] would be notified of discharge and will contact patient to schedule appointment for resumption of care.   Shaun from [facility] notified of discharge .  TOC signing off.     Final next level of care: Home w Home Health Services Barriers to Discharge: Barriers Resolved   Patient Goals and CMS Choice Patient states their goals for this hospitalization and ongoing recovery are:: [facility]          Discharge Placement                       Discharge Plan and Services Additional resources added to the After Visit Summary for       Post Acute Care Choice: Home Health                    HH Arranged: PT, OT [facility] Agency: [facility] Date HH Agency Contacted: 01/20/24 Time HH Agency Contacted: 1134 Representative spoke with at [facility] Agency: Shaun  Social Drivers of Health (SDOH) Interventions SDOH Screenings   Food Insecurity: No Food Insecurity (01/14/2024)  Housing: Low Risk  (01/14/2024)  Transportation Needs: No Transportation Needs (01/14/2024)  Utilities: Not At Risk (01/14/2024)  Financial Resource Strain: Low Risk  (08/02/2018)  Physical Activity: Unknown (08/02/2018)  Social Connections: Socially Integrated (01/14/2024)  Stress: No Stress Concern Present (08/02/2018)  Tobacco Use: High Risk (01/17/2024)     Readmission Risk Interventions     No data to display

## 2024-02-14 ENCOUNTER — Other Ambulatory Visit: Payer: Self-pay

## 2024-02-28 ENCOUNTER — Other Ambulatory Visit: Payer: Self-pay | Admitting: Obstetrics and Gynecology

## 2024-03-14 ENCOUNTER — Ambulatory Visit
Admission: RE | Admit: 2024-03-14 | Discharge: 2024-03-14 | Disposition: A | Discharge status: Home / Self Care | Attending: Cardiology | Admitting: Cardiology

## 2024-03-14 DIAGNOSIS — I4819 Other persistent atrial fibrillation: Secondary | ICD-10-CM

## 2024-03-14 SURGERY — ECHOCARDIOGRAM, TRANSESOPHAGEAL
Anesthesia: General

## 2024-03-14 MED ORDER — SODIUM CHLORIDE 0.9 % IV SOLN
INTRAVENOUS | Status: DC
Start: 2024-03-14 — End: 2024-03-14

## 2024-03-14 MED ORDER — BUTAMBEN-TETRACAINE-BENZOCAINE 2-2-14 % EX AERO
INHALATION_SPRAY | CUTANEOUS | Status: AC
  Filled 2024-03-14: qty 5

## 2024-03-14 MED ORDER — LIDOCAINE VISCOUS HCL 2 % MT SOLN
OROMUCOSAL | Status: AC
  Filled 2024-03-14: qty 15

## 2024-03-14 NOTE — Progress Notes (Signed)
 Pt arrived to [facility] for preprocedure stating he ate breakfast at 8:00am. Per anesthesia, procedure will need to be rescheduled. Dr Bob Burn notified. Stated [facility] office will reach out to patient for reschedule.

## 2024-03-15 ENCOUNTER — Ambulatory Visit: Admit: 2024-03-15 | Admitting: Cardiology

## 2024-03-23 ENCOUNTER — Ambulatory Visit: Admitting: Anesthesiology

## 2024-03-23 ENCOUNTER — Encounter
Admission: RE | Disposition: A | Discharge status: Home / Self Care | Payer: Self-pay | Source: Home / Self Care | Attending: Cardiology

## 2024-03-23 ENCOUNTER — Ambulatory Visit
Admission: RE | Admit: 2024-03-23 | Discharge: 2024-03-23 | Disposition: A | Discharge status: Home / Self Care | Source: Home / Self Care | Admitting: Cardiology

## 2024-03-23 ENCOUNTER — Encounter: Payer: Self-pay | Admitting: Cardiology

## 2024-03-23 ENCOUNTER — Other Ambulatory Visit: Payer: Self-pay

## 2024-03-23 ENCOUNTER — Ambulatory Visit
Admission: RE | Admit: 2024-03-23 | Discharge: 2024-03-23 | Disposition: A | Discharge status: Home / Self Care | Attending: Cardiology | Admitting: Cardiology

## 2024-03-23 DIAGNOSIS — I513 Intracardiac thrombosis, not elsewhere classified: Secondary | ICD-10-CM | POA: Insufficient documentation

## 2024-03-23 DIAGNOSIS — I2510 Atherosclerotic heart disease of native coronary artery without angina pectoris: Secondary | ICD-10-CM | POA: Insufficient documentation

## 2024-03-23 DIAGNOSIS — Z951 Presence of aortocoronary bypass graft: Secondary | ICD-10-CM | POA: Diagnosis not present

## 2024-03-23 DIAGNOSIS — I110 Hypertensive heart disease with heart failure: Secondary | ICD-10-CM | POA: Diagnosis not present

## 2024-03-23 DIAGNOSIS — Z7901 Long term (current) use of anticoagulants: Secondary | ICD-10-CM | POA: Insufficient documentation

## 2024-03-23 DIAGNOSIS — I482 Chronic atrial fibrillation, unspecified: Secondary | ICD-10-CM

## 2024-03-23 DIAGNOSIS — I252 Old myocardial infarction: Secondary | ICD-10-CM | POA: Diagnosis not present

## 2024-03-23 DIAGNOSIS — E119 Type 2 diabetes mellitus without complications: Secondary | ICD-10-CM | POA: Insufficient documentation

## 2024-03-23 DIAGNOSIS — Z95810 Presence of automatic (implantable) cardiac defibrillator: Secondary | ICD-10-CM | POA: Insufficient documentation

## 2024-03-23 DIAGNOSIS — Z8249 Family history of ischemic heart disease and other diseases of the circulatory system: Secondary | ICD-10-CM | POA: Diagnosis not present

## 2024-03-23 DIAGNOSIS — I5022 Chronic systolic (congestive) heart failure: Secondary | ICD-10-CM | POA: Insufficient documentation

## 2024-03-23 DIAGNOSIS — Z79899 Other long term (current) drug therapy: Secondary | ICD-10-CM | POA: Insufficient documentation

## 2024-03-23 DIAGNOSIS — Z87891 Personal history of nicotine dependence: Secondary | ICD-10-CM | POA: Insufficient documentation

## 2024-03-23 DIAGNOSIS — I4819 Other persistent atrial fibrillation: Secondary | ICD-10-CM | POA: Diagnosis not present

## 2024-03-23 DIAGNOSIS — Z7984 Long term (current) use of oral hypoglycemic drugs: Secondary | ICD-10-CM | POA: Insufficient documentation

## 2024-03-23 DIAGNOSIS — E785 Hyperlipidemia, unspecified: Secondary | ICD-10-CM | POA: Diagnosis not present

## 2024-03-23 DIAGNOSIS — I4891 Unspecified atrial fibrillation: Secondary | ICD-10-CM | POA: Diagnosis present

## 2024-03-23 HISTORY — PX: CARDIOVERSION: SHX1299

## 2024-03-23 HISTORY — PX: TEE WITHOUT CARDIOVERSION: SHX5443

## 2024-03-23 LAB — ECHO TEE

## 2024-03-23 LAB — GLUCOSE, CAPILLARY: Glucose-Capillary: 123 mg/dL — ABNORMAL HIGH (ref 70–99)

## 2024-03-23 SURGERY — ECHOCARDIOGRAM, TRANSESOPHAGEAL
Anesthesia: General

## 2024-03-23 MED ORDER — BUTAMBEN-TETRACAINE-BENZOCAINE 2-2-14 % EX AERO
INHALATION_SPRAY | CUTANEOUS | Status: AC
  Filled 2024-03-23: qty 5

## 2024-03-23 MED ORDER — LIDOCAINE VISCOUS HCL 2 % MT SOLN
OROMUCOSAL | Status: AC
Start: 2024-03-23 — End: ?
  Filled 2024-03-23: qty 15

## 2024-03-23 MED ORDER — PROPOFOL 10 MG/ML IV BOLUS
INTRAVENOUS | Status: DC | PRN
Start: 2024-03-23 — End: 2024-03-23
  Administered 2024-03-23 (×2): 50 mg via INTRAVENOUS

## 2024-03-23 MED ORDER — SODIUM CHLORIDE 0.9 % IV SOLN: INTRAVENOUS | Status: DC

## 2024-03-23 MED ORDER — LIDOCAINE HCL (PF) 2 % IJ SOLN
INTRAMUSCULAR | Status: DC | PRN
  Administered 2024-03-23: 100 mg via INTRADERMAL

## 2024-03-23 NOTE — Procedures (Signed)
 Electrical Cardioversion Procedure Note  Indication: Atrial fibrillation  Procedure Details: Consent: Indication, Risk/benefits of procedure as well as the alternatives explained to patient and informed consent obtained. Time out performed. Verified patient identification, verified procedure, verified correct patient position, special equipment/implants available, medications/allergies/relevent history reviewed, required imaging and test results reviewed.  Deep sedation was provided by anesthesia with propofol . Patient was delivered with 200 Joules of electricity X 1 with success to AV dual paced rhythm. Patient tolerated the procedure well. No immediate complication noted.   Successful cardioversion  Joetta Mustache, MD [facility] Cardiology- [facility]

## 2024-03-23 NOTE — Progress Notes (Signed)
*  PRELIMINARY RESULTS* Echocardiogram Echocardiogram Transesophageal has been performed.  Jason [name] 03/23/2024, 1:53 PM

## 2024-03-23 NOTE — Anesthesia Preprocedure Evaluation (Addendum)
 Anesthesia Evaluation  Patient identified by MRN, date of birth, ID band Patient awake    Reviewed: Allergy & Precautions, NPO status , Patient's Chart, lab work & pertinent test results  History of Anesthesia Complications Negative for: history of anesthetic complications  Airway Mallampati: I   Neck ROM: Full    Dental  (+) Edentulous Upper, Edentulous Lower   Pulmonary neg pulmonary ROS   Pulmonary exam normal breath sounds clear to auscultation       Cardiovascular hypertension, + CAD (s/p MI and [name]) and +CHF (EF 30-35%)  + dysrhythmias (a fib on Pradaxa ) + Cardiac Defibrillator  Rhythm:Irregular Rate:Normal     Neuro/Psych negative neurological ROS     GI/Hepatic   Endo/Other  diabetes, Type 2Hypothyroidism    Renal/GU Renal disease (CKD)     Musculoskeletal  (+) Arthritis ,    Abdominal   Peds  Hematology  (+) Blood dyscrasia, anemia   Anesthesia Other Findings   Reproductive/Obstetrics                             Anesthesia Physical Anesthesia Plan  ASA: 3  Anesthesia Plan: General   Post-op Pain Management:    Induction: Intravenous  PONV Risk Score and Plan: 2 and Propofol  infusion, TIVA and Treatment may vary due to age or medical condition  Airway Management Planned: Natural Airway  Additional Equipment:   Intra-op Plan:   Post-operative Plan:   Informed Consent: I have reviewed the patients History and Physical, chart, labs and discussed the procedure including the risks, benefits and alternatives for the proposed anesthesia with the patient or authorized representative who has indicated his/her understanding and acceptance.       Plan Discussed with: CRNA  Anesthesia Plan Comments: (LMA/GETA backup discussed.  Patient consented for risks of anesthesia including but not limited to:  - adverse reactions to medications - damage to eyes, teeth, lips or  other oral mucosa - nerve damage due to positioning  - sore throat or hoarseness - damage to heart, brain, nerves, lungs, other parts of body or loss of life  Informed patient about role of CRNA in peri- and intra-operative care.  Patient voiced understanding.)        Anesthesia Quick Evaluation

## 2024-03-23 NOTE — Transfer of Care (Signed)
 Immediate Anesthesia Transfer of Care Note  Patient: Georga Killings  Procedure(s) Performed: ECHOCARDIOGRAM, TRANSESOPHAGEAL CARDIOVERSION  Patient Location: Short Stay  Anesthesia Type:General  Level of Consciousness: drowsy and patient cooperative  Airway & Oxygen Therapy: Patient Spontanous Breathing and Patient connected to nasal cannula oxygen  Post-op Assessment: Report given to RN and Post -op Vital signs reviewed and stable  Post vital signs: stable  Last Vitals:  Vitals Value Taken Time  BP 118/74 03/23/24 1348  Temp    Pulse 80 03/23/24 1350  Resp 10 03/23/24 1350  SpO2 100 % 03/23/24 1350    Last Pain:  Vitals:   03/23/24 1257  TempSrc: Oral  PainSc: 0-No pain         Complications: No notable events documented.

## 2024-03-24 ENCOUNTER — Encounter: Payer: Self-pay | Admitting: Cardiology

## 2024-03-24 NOTE — H&P (Signed)
 H & P:  History of Present Illness: Jason [name] is a 73 y.o.male patient who presented to our clinic for follow-up of heart failure, atrial fibrillation.  Past medical history significant for persistent atrial fibrillation, CAD with prior [name], heart failure with reduced EF, status post ICD, diabetes, hypertension, hyperlipidemia. Echo 2023 with LVEF of 40%. Stress test 2023 with no ischemia. He was admitted to hospital 12/2023 for decompensated heart failure with reduced EF, underwent significant diuresis. TEE at that time showed LVEF of 30 to 35%, RV mildly dysfunctional, dilated left atrium with left atrial appendage thrombus. No cardioversion performed. Anticoagulation changed to Pradaxa .  Today patient detailed that he is overall stable since last visit. Weight stable around 225 pounds. Has stable NYHA class II exertional dyspnea, he walks with a walker. No orthopnea. Has stable pedal edema. No palpitation, dizziness or syncope.   Past Medical and Surgical History  Past Medical History Past Medical History:  Diagnosis Date  Anesthesia complication  Hard time getting bowels started for 2 weeks after anesthesia typically  Diabetes mellitus type 2, uncomplicated (CMS/HHS-HCC)  Hyperlipidemia  Hypertension  Testosterone  deficiency   Past Surgical History He has a past surgical history that includes Coronary angioplasty (2017); Back surgery; left extensor tendon release; Arthroscopy knee, partial medial and lateral menisctomy, removal of foreign body (Left, 10/08/2015); Cardioversion External (08/2018); Cholecystectomy; Cataract extraction; and Hand surgery.   Medications and Allergies  Current Medications  Current Outpatient Medications  Medication Sig Dispense Refill  AMIOdarone  (PACERONE ) 200 MG tablet Take 1 tablet (200 mg total) by mouth once daily 30 tablet 11  bumetanide  (BUMEX ) 1 MG tablet Take 1 tablet by mouth 2 (two) times daily  dabigatran  (PRADAXA ) 150 mg capsule Take 1  capsule (150 mg total) by mouth 2 (two) times daily 60 capsule 5  FARXIGA  10 mg tablet Take 10 mg by mouth every morning  gabapentin  (NEURONTIN ) 100 MG capsule 1 po qHS 30 capsule 1  glimepiride  (AMARYL ) 4 MG tablet Take 4 mg by mouth 2 (two) times daily.  levothyroxine  (SYNTHROID ) 50 MCG tablet Take 50 mcg by mouth once daily  metFORMIN  (GLUCOPHAGE ) 1000 MG tablet Take 1,000 mg by mouth 2 (two) times daily  metoprolol  SUCCinate (TOPROL -XL) 50 MG XL tablet Take 1 tablet (50 mg total) by mouth once daily 30 tablet 11  potassium chloride  (KLOR-CON  M20) 20 MEQ ER tablet Take 1 tablet (20 mEq total) by mouth once daily 30 tablet 11  predniSONE (DELTASONE) 10 MG tablet Take 1 tablet (10 mg total) by mouth once daily 10 day taper, 5,5,4,4,3,3,2,2,1,1. Start with 5 tabs daily for 2 days then taper down 1 tab every 2 days. 30 tablet 0  rosuvastatin  (CRESTOR ) 40 MG tablet take 1 tablet by mouth every day 90 tablet 3  sacubitriL -valsartan  (ENTRESTO ) 24-26 mg tablet Take 1 tablet by mouth every 12 (twelve) hours 60 tablet 5  spironolactone  (ALDACTONE ) 25 MG tablet Take 1 tablet by mouth once daily  traMADoL  (ULTRAM ) 50 mg tablet Take 1 tablet (50 mg total) by mouth 2 (two) times daily as needed 1 po bid prn 14 tablet 0  levothyroxine  (SYNTHROID ) 25 MCG tablet Take 25 mcg by mouth once daily Take on an empty stomach with a glass of water  at least 30-60 minutes before breakfast. (Patient not taking: Reported on 01/27/2024)   No current facility-administered medications for this visit.   Allergies: Aspirin, Atorvastatin , Inderal [propranolol], Lovastatin, Penicillin, Pravastatin sodium, Rosuvastatin , and Simvastatin  Social and Family History  Social History reports  that he has quit smoking. His smokeless tobacco use includes snuff. He reports that he does not currently use alcohol. He reports that he does not use drugs.  Family History Family History  Problem Relation Name Age of Onset  High blood  pressure (Hypertension) Father  Heart disease Father  Aortic stenosis Father  Anesthesia problems Neg Hx  Malignant hyperthermia Neg Hx   Review of Systems   Review of Systems: The patient denies chest pain, shortness of breath, orthopnea, paroxysmal nocturnal dyspnea, pedal edema, palpitations, heart racing, presyncope, syncope.   Physical Examination   Vitals:BP 124/80  Pulse 88  Ht 190.5 cm (6\' 3" )  Wt (!) 102.1 kg (225 lb)  SpO2 97%  BMI 28.12 kg/m  Ht:190.5 cm (6\' 3" ) Wt:(!) 102.1 kg (225 lb) ZOX:WRUE surface area is 2.32 meters squared. Body mass index is 28.12 kg/m.  HEENT: Pupils equally reactive to light and accomodation  Neck: Supple, no significant JVD Lungs: clear to auscultation bilaterally; no wheezes, rales, rhonchi Heart: Regular rate and rhythm. No murmur Extremities: +1 pedal edema  Assessment and Plan   73 y.o. 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 Continue current medical therapy.  Jason Mustache, MD [facility] Cardiology- [facility]

## 2024-03-24 NOTE — Anesthesia Postprocedure Evaluation (Signed)
 Anesthesia Post Note  Patient: Jason [name]  Procedure(s) Performed: ECHOCARDIOGRAM, TRANSESOPHAGEAL CARDIOVERSION  Patient location during evaluation: PACU Anesthesia Type: General Level of consciousness: awake and alert, oriented and patient cooperative Pain management: pain level controlled Vital Signs Assessment: post-procedure vital signs reviewed and stable Respiratory status: spontaneous breathing, nonlabored ventilation and respiratory function stable Cardiovascular status: blood pressure returned to baseline and stable Postop Assessment: adequate PO intake Anesthetic complications: no   There were no known notable events for this encounter.   Last Vitals:  Vitals:   03/23/24 1445 03/23/24 1500  BP: 121/70 117/69  Pulse: 80 76  Resp: 19 13  Temp:  36.6 C  SpO2: 97% 98%    Last Pain:  Vitals:   03/23/24 1500  TempSrc: Oral  PainSc: 0-No pain                 Boen Sterbenz

## 2024-05-22 ENCOUNTER — Other Ambulatory Visit (HOSPITAL_COMMUNITY): Payer: Self-pay

## 2024-08-07 ENCOUNTER — Other Ambulatory Visit: Payer: Self-pay

## 2024-08-07 DIAGNOSIS — I480 Paroxysmal atrial fibrillation: Secondary | ICD-10-CM

## 2024-08-07 NOTE — Progress Notes (Signed)
 Per Dr Almetta - schedule cardiac CT with pulmonary vein on same day as pt appointment with him.  Pt is scheduled for 08/22/24

## 2024-08-21 ENCOUNTER — Telehealth (HOSPITAL_COMMUNITY): Payer: Self-pay | Admitting: *Deleted

## 2024-08-21 NOTE — Telephone Encounter (Signed)
 Attempted to call patient regarding upcoming cardiac CT appointment. Unable to leave VM. Sid Seats RN Navigator Cardiac Imaging [facility] [phone] Office

## 2024-08-22 ENCOUNTER — Ambulatory Visit (HOSPITAL_COMMUNITY)
Admission: RE | Admit: 2024-08-22 | Discharge: 2024-08-22 | Disposition: A | Discharge status: Home / Self Care | Source: Ambulatory Visit | Attending: Student in an Organized Health Care Education/Training Program | Admitting: Student in an Organized Health Care Education/Training Program

## 2024-08-22 ENCOUNTER — Ambulatory Visit: Admitting: Student in an Organized Health Care Education/Training Program

## 2024-08-22 ENCOUNTER — Encounter: Payer: Self-pay | Admitting: Student in an Organized Health Care Education/Training Program

## 2024-08-22 VITALS — BP 104/62 | HR 70 | Ht 76.0 in | Wt 207.7 lb

## 2024-08-22 DIAGNOSIS — Z01812 Encounter for preprocedural laboratory examination: Secondary | ICD-10-CM | POA: Insufficient documentation

## 2024-08-22 DIAGNOSIS — I480 Paroxysmal atrial fibrillation: Secondary | ICD-10-CM

## 2024-08-22 LAB — POCT I-STAT CREATININE: Creatinine, Ser: 1.3 mg/dL — ABNORMAL HIGH (ref 0.61–1.24)

## 2024-08-22 MED ORDER — IOHEXOL 350 MG/ML SOLN
100.0000 mL | Freq: Once | INTRAVENOUS | Status: AC | PRN
  Administered 2024-08-22: 100 mL via INTRAVENOUS

## 2024-08-22 NOTE — Patient Instructions (Signed)
 Medication Instructions:  Your physician recommends that you continue on your current medications as directed. Please refer to the Current Medication list given to you today.  *If you need a refill on your cardiac medications before your next appointment, please call your pharmacy*  Testing/Procedures: Ablation Your physician has recommended that you have an ablation. Catheter ablation is a medical procedure used to treat some cardiac arrhythmias (irregular heartbeats). During catheter ablation, a long, thin, flexible tube is put into a blood vessel in your groin (upper thigh), or neck. This tube is called an ablation catheter. It is then guided to your heart through the blood vessel. Radio frequency waves destroy small areas of heart tissue where abnormal heartbeats may cause an arrhythmia to start.   You are scheduled for Atrial Fibrillation Ablation on Monday, November 17 with Dr. Dr. Almetta. Please arrive at the Main Entrance A at [facility]: [address] [place], [place] [address] at 5:30 AM   What To Expect:  Labs: you will need to have lab work drawn within 30 days of your procedure. Please go to any [facility] location to have these drawn - no appointment is needed. You will receive procedure instructions either through MyChart or in the mail 4-6 week prior to your procedure.  After your procedure we recommend no driving for 4 days, no lifting over 5 lbs for 7 days, and no work or strenuous activity for 7 days.  Please contact our office at [phone] if you have any questions.    Follow-Up: We will contact you to schedule your post-procedure appointments.

## 2024-08-22 NOTE — Addendum Note (Signed)
 Addended by: CASIMIR ALDONA BRAVO on: 08/22/2024 05:26 PM   Modules accepted: Orders

## 2024-08-22 NOTE — Progress Notes (Signed)
 Cardiology Office Note   Date:  08/22/2024  ID:  Jason [name], DOB 07-Jul-1951, MRN [id number] PCP: Jason Lot, PA-C  [facility] Providers Cardiologist:  None Electrophysiologist:  Jason DELENA Primus, MD   History of Present Illness Jason [name] is a 73 y.o. male with persistent AF/AFL, CAD s/p 4v [name] (04/14/16, Dr. Sudie [name]), HFrEF s/p 1' prevention LEFT MDT DC ICD (04/12/19, Dr. Franky [name]), prior LV thrombus, DM2, and HTN who presents for AF/AFL management.  He was seen by Dr. Myer [name] on 08/01/2024.  He had recently been cardioverted and started on amiodarone  with improvement in heart failure symptoms.  He was referred to me as he was closer to [facility] than [facility] for management of his atrial arrhythmias.  I spoke with him at length on the phone prior to our visit and reviewed different options for management of his atrial arrhythmias and he preferred to proceed with ablation.  He does not have great rhythm awareness but was recently cardioverted and started on amiodarone  and noticed improvement heart failure symptoms of lower extremity edema and associated dyspnea on exertion.  ROS: DOE, LE edema  Studies Reviewed  ECG review: 03/23/24: (13:48:21), APVP 80, PR 220, QRS 204, QT/c 478/552 03/23/24: (12:52:33), AF/VR 77, QRS 143, QT/c 431/488 01/17/24: AF/VR 75, QRS 142, QT/c 452/504 01/13/24: AF/VR 67, QRS 134, QT/c 438/462 12/08/23: AF/RVR 137, QRS 141, QT/c 339/512 10/24/23: ST 109, PR 140, QRS 138, QT/c 370/498 10/13/23: NSR 73, PR 214, QRS 134, QT/c 426/469 08/02/18: (08:02:36), NSR 75, QRS 151, QT/c 475/531 (post DCCV) 08/02/18: (07:27:29), AFL 85, QRS 148, QT/c 471/561 (pre DCCV)  TTE Result date: 10/26/23  1. Left ventricular ejection fraction, by estimation, is 50 to 55%. Left  ventricular ejection fraction by PLAX is 54 %. The left ventricle has low  normal function. The left ventricle has no regional wall motion  abnormalities. There is mild  left  ventricular hypertrophy. Left ventricular diastolic parameters are  indeterminate.   2. Right ventricular systolic function is normal. The right ventricular  size is normal.   3. The mitral valve is degenerative. No evidence of mitral valve  regurgitation.   4. The aortic valve is tricuspid. Aortic valve regurgitation is not  visualized. Aortic valve sclerosis is present, with no evidence of aortic  valve stenosis.   5. Aortic dilatation noted. There is borderline dilatation of the aortic  root, measuring 38 mm.   Coronary angiography Result date: 04/13/16 LM lesion, 99% stenosed. Ost Cx lesion, 99% stenosed. Prox LAD lesion, 99% stenosed. Ost 1st Diag lesion, 55% stenosed. Mid LAD lesion, 100% stenosed. Prox RCA lesion, 75% stenosed. Mid RCA lesion, 45% stenosed. Dist RCA lesion, 50% stenosed. RPDA lesion, 45% stenosed. Assessment The patient has had progressive canadian class 4 anginal symptoms with  risk factors including diabetes, high blood pressure and high cholesterol. abnormal left ventricular function with ejection fraction of 45% with hypokinesis of anterior wall severe 3 vessel coronary artery disease  There is significant stenosis of left main, lcx and lad with heavy calcium  Plan Continue medical management of CAD risk factors, Consider consultation for [name] and Additional medications for management of angina  Risk Assessment/Calculations  CHA2DS2-VASc Score = 5  This indicates a 7.2% annual risk of stroke. The patient's score is based upon: CHF History: 1 HTN History: 1 Diabetes History: 1 Stroke History: 0 Vascular Disease History: 1 Age Score: 1 Gender Score: 0  Physical Exam VS:  BP 104/62   Pulse 70  Ht 6' 4 (1.93 m)   Wt 207 lb 11.2 oz (94.2 kg)   SpO2 97%   BMI 25.28 kg/m       Wt Readings from Last 3 Encounters:  08/22/24 207 lb 11.2 oz (94.2 kg)  03/23/24 225 lb 8 oz (102.3 kg)  01/20/24 218 lb 1.6 oz (98.9 kg)    GEN: Well  nourished, well developed in no acute distress NECK: No JVD; No carotid bruits CARDIAC: RRR, no murmurs, rubs, gallops RESPIRATORY:  Clear to auscultation without rales, wheezing or rhonchi  SKIN: LEFT infraclavicular incision well healed, no erythema or swelling  EXTREMITIES:  No edema; No deformity   ASSESSMENT AND PLAN Jason [name] is a 73 y.o. male with persistent AF/AFL, CAD s/p 4v [name] (04/14/16, Dr. Sudie [name]), HFrEF s/p 1' prevention LEFT MDT DC ICD (04/12/19, Dr. Franky [name]), prior LV thrombus, DM2, and HTN who presents for AF/AFL management.  Persistent AF/AFL ICM/HFrecEF 1' prevention LEFT MDT DC ICD Prior LV thrombus  We discussed different options for management of his atrial fibrillation and why rhythm control would be more ideal than rate control with HFrecEF and HF sx. He understands that although ablation is not curative it can significantly reduce the arrhythmia burden.  He did have 1 isolated episode of atrial flutter in 2019 and has not had documented recurrence of atrial flutter since cardioversion.  My main concern for risk of ablation with Jason [name] is his prior issues with general anesthesia and significant ileus requiring prolonged hospitalizations.  Will discuss with the anesthesia team to see if there are any adjunct of measures to reduce this risk with the procedure.  Discussed treatment options today for AF including antiarrhythmic drug therapy and ablation. Discussed risks, recovery and likelihood of success with each treatment strategy. Risk, benefits, and alternatives to EP study and ablation for afib were discussed. These risks include but are not limited to stroke, bleeding, vascular damage, tamponade, perforation, damage to the esophagus, lungs, phrenic nerve and other structures, worsening renal function, coronary vasospasm and death.  Discussed potential need for repeat ablation procedures and antiarrhythmic drugs after an initial ablation. The patient  understands these risk and wishes to proceed.  We will therefore proceed with catheter ablation at the next available time.  Carto, ICE, anesthesia are requested for the procedure.   Pre procedure details: Schedule for de novo PFA PVI (Varipulse) + RF CTI (09/11/24)  Carto/ICE/GA-anesthesia, CT scan 08/22/24 with prior LV thrombus  Hold apixaban  and all medications the morning of the procedure Take apixaban  and all medications the night prior   Dispo: RTC post ablation   A total of 45 minutes was spent preparing for the patient, reviewing history, performing exam, document encounter, coordinating care and counseling the patient. 20 minutes was spent with direct patient care.   Signed, Jason DELENA Primus, MD

## 2024-08-23 ENCOUNTER — Telehealth: Payer: Self-pay

## 2024-08-23 LAB — BASIC METABOLIC PANEL WITH GFR
BUN/Creatinine Ratio: 16 (ref 10–24)
BUN: 20 mg/dL (ref 8–27)
CO2: 24 mmol/L (ref 20–29)
Calcium: 9.8 mg/dL (ref 8.6–10.2)
Chloride: 102 mmol/L (ref 96–106)
Creatinine, Ser: 1.27 mg/dL (ref 0.76–1.27)
Glucose: 149 mg/dL — AB (ref 70–99)
Potassium: 4.6 mmol/L (ref 3.5–5.2)
Sodium: 139 mmol/L (ref 134–144)
eGFR: 59 mL/min/1.73 — AB (ref >59)

## 2024-08-23 LAB — CBC
Hematocrit: 38.7 % (ref 37.5–51.0)
Hemoglobin: 12.6 g/dL — ABNORMAL LOW (ref 13.0–17.7)
MCH: 31.7 pg (ref 26.6–33.0)
MCHC: 32.6 g/dL (ref 31.5–35.7)
MCV: 97 fL (ref 79–97)
Platelets: 133 x10E3/uL — ABNORMAL LOW (ref 150–450)
RBC: 3.98 x10E6/uL — ABNORMAL LOW (ref 4.14–5.80)
RDW: 12.2 % (ref 11.6–15.4)
WBC: 5.5 x10E3/uL (ref 3.4–10.8)

## 2024-08-23 NOTE — Telephone Encounter (Signed)
-----   Message from Nurse Aldona R sent at 08/22/2024  5:26 PM EDT ----- Regarding: 11/17 730 Afib [place] Important: list procedure date as first item in subject line, followed by procedure type (e.g., 07/07/24 AFib ablation)  Precert:  MD: [place] Type of ablation: A-fib Diagnosis: Afib CPT code: A-fib (06343) Ablation scheduled (date/time): 11/17 730  Procedure:  Added to calendar? Yes Orders entered? Yes Letter complete? Yes Scheduled with cath lab? Yes Any medications to hold? Routine Labs ordered (CBC, BMET, PT/INR if on warfarin): Yes Mapping system: CARTO (lab 4 or 6) CARTO/OPAL rep notified? Yes Cardiac CT needed? Yes. completed Dye allergy? No Pre-meds ordered and instructions given? N/a Letter method: given to patient H&P: 10/28 Device: No  Follow-up:  Cassie/Angel, please schedule Routine.  Covering RN - please send this message to [employer], EP scheduler, EP Scheduling pool, EP [employer], and CT scheduler (Brittany Lynch/Stephanie Mogg), if indicated.

## 2024-09-04 ENCOUNTER — Encounter: Payer: Self-pay | Admitting: Emergency Medicine

## 2024-09-08 NOTE — Pre-Procedure Instructions (Signed)
 Instructed patient on the following items: Arrival time 0515 Nothing to eat or drink after midnight No meds AM of procedure Responsible person to drive you home and stay with you for 24 hrs  Have you missed any doses of anti-coagulant Pradaxa - takes twice a day,  hasn't missed any doses in last 4 weeks.  Don't take dose morning of procedure.

## 2024-09-11 ENCOUNTER — Other Ambulatory Visit: Payer: Self-pay

## 2024-09-11 ENCOUNTER — Encounter (HOSPITAL_COMMUNITY): Payer: Self-pay | Admitting: Student in an Organized Health Care Education/Training Program

## 2024-09-11 ENCOUNTER — Ambulatory Visit (HOSPITAL_COMMUNITY): Admitting: Certified Registered Nurse Anesthetist

## 2024-09-11 ENCOUNTER — Ambulatory Visit (HOSPITAL_COMMUNITY)
Admission: RE | Admit: 2024-09-11 | Discharge: 2024-09-11 | Disposition: A | Discharge status: Home / Self Care | Attending: Student in an Organized Health Care Education/Training Program | Admitting: Student in an Organized Health Care Education/Training Program

## 2024-09-11 ENCOUNTER — Ambulatory Visit (HOSPITAL_COMMUNITY)
Admission: RE | Disposition: A | Discharge status: Home / Self Care | Payer: Self-pay | Source: Home / Self Care | Attending: Student in an Organized Health Care Education/Training Program

## 2024-09-11 DIAGNOSIS — I110 Hypertensive heart disease with heart failure: Secondary | ICD-10-CM | POA: Diagnosis not present

## 2024-09-11 DIAGNOSIS — Z79899 Other long term (current) drug therapy: Secondary | ICD-10-CM | POA: Insufficient documentation

## 2024-09-11 DIAGNOSIS — I4819 Other persistent atrial fibrillation: Secondary | ICD-10-CM | POA: Diagnosis present

## 2024-09-11 DIAGNOSIS — Z95810 Presence of automatic (implantable) cardiac defibrillator: Secondary | ICD-10-CM | POA: Diagnosis not present

## 2024-09-11 DIAGNOSIS — Z7901 Long term (current) use of anticoagulants: Secondary | ICD-10-CM | POA: Insufficient documentation

## 2024-09-11 DIAGNOSIS — I252 Old myocardial infarction: Secondary | ICD-10-CM | POA: Diagnosis not present

## 2024-09-11 DIAGNOSIS — Z951 Presence of aortocoronary bypass graft: Secondary | ICD-10-CM | POA: Diagnosis not present

## 2024-09-11 DIAGNOSIS — E119 Type 2 diabetes mellitus without complications: Secondary | ICD-10-CM | POA: Diagnosis not present

## 2024-09-11 DIAGNOSIS — E039 Hypothyroidism, unspecified: Secondary | ICD-10-CM | POA: Insufficient documentation

## 2024-09-11 DIAGNOSIS — I2510 Atherosclerotic heart disease of native coronary artery without angina pectoris: Secondary | ICD-10-CM

## 2024-09-11 DIAGNOSIS — I483 Typical atrial flutter: Secondary | ICD-10-CM | POA: Diagnosis not present

## 2024-09-11 DIAGNOSIS — I5032 Chronic diastolic (congestive) heart failure: Secondary | ICD-10-CM | POA: Diagnosis not present

## 2024-09-11 DIAGNOSIS — I5033 Acute on chronic diastolic (congestive) heart failure: Secondary | ICD-10-CM | POA: Diagnosis not present

## 2024-09-11 DIAGNOSIS — K219 Gastro-esophageal reflux disease without esophagitis: Secondary | ICD-10-CM | POA: Insufficient documentation

## 2024-09-11 DIAGNOSIS — I255 Ischemic cardiomyopathy: Secondary | ICD-10-CM | POA: Insufficient documentation

## 2024-09-11 DIAGNOSIS — I480 Paroxysmal atrial fibrillation: Secondary | ICD-10-CM

## 2024-09-11 HISTORY — PX: ATRIAL FIBRILLATION ABLATION: EP1191

## 2024-09-11 LAB — GLUCOSE, CAPILLARY
Glucose-Capillary: 151 mg/dL — ABNORMAL HIGH (ref 70–99)
Glucose-Capillary: 148 mg/dL — ABNORMAL HIGH (ref 70–99)
Glucose-Capillary: 159 mg/dL — ABNORMAL HIGH (ref 70–99)

## 2024-09-11 LAB — POCT ACTIVATED CLOTTING TIME
Activated Clotting Time: 383 s
Activated Clotting Time: 400 s

## 2024-09-11 MED ORDER — ONDANSETRON HCL 4 MG/2ML IJ SOLN
4.0000 mg | Freq: Four times a day (QID) | INTRAMUSCULAR | Status: DC | PRN
Start: 2024-09-11 — End: 2024-09-11

## 2024-09-11 MED ORDER — SODIUM CHLORIDE 0.9 % IV SOLN: 250.0000 mL | INTRAVENOUS | Status: DC | PRN

## 2024-09-11 MED ORDER — SODIUM CHLORIDE 0.9% FLUSH
3.0000 mL | INTRAVENOUS | Status: DC | PRN
Start: 2024-09-11 — End: 2024-09-11

## 2024-09-11 MED ORDER — PROTAMINE SULFATE 10 MG/ML IV SOLN
INTRAVENOUS | Status: DC | PRN
  Administered 2024-09-11: 40 mg via INTRAVENOUS

## 2024-09-11 MED ORDER — DABIGATRAN ETEXILATE MESYLATE 150 MG PO CAPS
150.0000 mg | ORAL_CAPSULE | Freq: Two times a day (BID) | ORAL | Status: DC
  Administered 2024-09-11: 150 mg via ORAL
  Filled 2024-09-11: qty 1

## 2024-09-11 MED ORDER — HEPARIN SODIUM (PORCINE) 1000 UNIT/ML IJ SOLN
INTRAMUSCULAR | Status: AC
  Filled 2024-09-11: qty 10

## 2024-09-11 MED ORDER — PHENYLEPHRINE HCL-NACL 20-0.9 MG/250ML-% IV SOLN
INTRAVENOUS | Status: DC | PRN
  Administered 2024-09-11: 20 ug/min via INTRAVENOUS

## 2024-09-11 MED ORDER — CEFAZOLIN SODIUM-DEXTROSE 2-3 GM-%(50ML) IV SOLR
INTRAVENOUS | Status: DC | PRN
Start: 2024-09-11 — End: 2024-09-11
  Administered 2024-09-11: 2 g via INTRAVENOUS

## 2024-09-11 MED ORDER — DEXAMETHASONE SOD PHOSPHATE PF 10 MG/ML IJ SOLN
INTRAMUSCULAR | Status: DC | PRN
  Administered 2024-09-11: 4 mg via INTRAVENOUS

## 2024-09-11 MED ORDER — SODIUM CHLORIDE 0.9 % IV SOLN: INTRAVENOUS | Status: DC

## 2024-09-11 MED ORDER — ONDANSETRON HCL 4 MG/2ML IJ SOLN
INTRAMUSCULAR | Status: DC | PRN
  Administered 2024-09-11: 4 mg via INTRAVENOUS

## 2024-09-11 MED ORDER — HEPARIN SODIUM (PORCINE) 1000 UNIT/ML IJ SOLN
INTRAMUSCULAR | Status: DC | PRN
Start: 2024-09-11 — End: 2024-09-11
  Administered 2024-09-11: 1000 [IU] via INTRAVENOUS

## 2024-09-11 MED ORDER — HEPARIN SODIUM (PORCINE) 1000 UNIT/ML IJ SOLN
INTRAMUSCULAR | Status: DC | PRN
Start: 2024-09-11 — End: 2024-09-11
  Administered 2024-09-11: 7000 [IU] via INTRAVENOUS
  Administered 2024-09-11: 20000 [IU] via INTRAVENOUS

## 2024-09-11 MED ORDER — FENTANYL CITRATE (PF) 250 MCG/5ML IJ SOLN
INTRAMUSCULAR | Status: DC | PRN
  Administered 2024-09-11: 100 ug via INTRAVENOUS

## 2024-09-11 MED ORDER — FENTANYL CITRATE (PF) 100 MCG/2ML IJ SOLN
INTRAMUSCULAR | Status: AC
  Filled 2024-09-11: qty 2

## 2024-09-11 MED ORDER — PROPOFOL 10 MG/ML IV BOLUS
INTRAVENOUS | Status: DC | PRN
Start: 2024-09-11 — End: 2024-09-11
  Administered 2024-09-11: 120 mg via INTRAVENOUS

## 2024-09-11 MED ORDER — CEFAZOLIN SODIUM-DEXTROSE 2-4 GM/100ML-% IV SOLN
INTRAVENOUS | Status: AC
  Filled 2024-09-11: qty 100

## 2024-09-11 MED ORDER — SODIUM CHLORIDE 0.9% FLUSH: 3.0000 mL | Freq: Two times a day (BID) | INTRAVENOUS | Status: DC

## 2024-09-11 MED ORDER — ACETAMINOPHEN 325 MG PO TABS: 650.0000 mg | ORAL_TABLET | ORAL | Status: DC | PRN

## 2024-09-11 MED ORDER — LIDOCAINE 2% (20 MG/ML) 5 ML SYRINGE
INTRAMUSCULAR | Status: DC | PRN
  Administered 2024-09-11: 80 mg via INTRAVENOUS

## 2024-09-11 MED ORDER — SUGAMMADEX SODIUM 200 MG/2ML IV SOLN
INTRAVENOUS | Status: DC | PRN
  Administered 2024-09-11: 200 mg via INTRAVENOUS

## 2024-09-11 MED ORDER — HEPARIN (PORCINE) IN NACL 1000-0.9 UT/500ML-% IV SOLN
INTRAVENOUS | Status: DC | PRN
  Administered 2024-09-11 (×2): 500 mL

## 2024-09-11 MED ORDER — ATROPINE SULFATE 1 MG/10ML IJ SOSY
PREFILLED_SYRINGE | INTRAMUSCULAR | Status: AC
  Filled 2024-09-11: qty 10

## 2024-09-11 MED ORDER — ROCURONIUM BROMIDE 10 MG/ML (PF) SYRINGE
PREFILLED_SYRINGE | INTRAVENOUS | Status: DC | PRN
  Administered 2024-09-11: 10 mg via INTRAVENOUS
  Administered 2024-09-11: 70 mg via INTRAVENOUS

## 2024-09-11 NOTE — H&P (Signed)
 Date: 09/11/24 ID:  Jason [name], DOB November 19, 1950, MRN [id number] PCP: Montey Lot, PA-C   [facility] Providers Cardiologist:  None Electrophysiologist:  [name] DELENA Primus, MD    History of Present Illness Jason [name] is a 73 y.o. male with persistent AF/AFL, CAD s/p 4v [name] (04/14/16, Dr. Sudie Laine), HFrEF s/p 1' prevention LEFT MDT DC ICD (04/12/19, Dr. Franky Ned), prior LV thrombus, DM2, and HTN who presents for AF/AFL management.   He was seen by Dr. Myer Kerns on 08/01/2024.  He had recently been cardioverted and started on amiodarone  with improvement in heart failure symptoms.  He was referred to me as he was closer to [facility] than [facility] for management of his atrial arrhythmias.  I spoke with him at length on the phone prior to our visit and reviewed different options for management of his atrial arrhythmias and he preferred to proceed with ablation.  He does not have great rhythm awareness but was recently cardioverted and started on amiodarone  and noticed improvement heart failure symptoms of lower extremity edema and associated dyspnea on exertion.   ROS: DOE, LE edema   Studies Reviewed  ECG review: 03/23/24: (13:48:21), APVP 80, PR 220, QRS 204, QT/c 478/552 03/23/24: (12:52:33), AF/VR 77, QRS 143, QT/c 431/488 01/17/24: AF/VR 75, QRS 142, QT/c 452/504 01/13/24: AF/VR 67, QRS 134, QT/c 438/462 12/08/23: AF/RVR 137, QRS 141, QT/c 339/512 10/24/23: ST 109, PR 140, QRS 138, QT/c 370/498 10/13/23: NSR 73, PR 214, QRS 134, QT/c 426/469 08/02/18: (08:02:36), NSR 75, QRS 151, QT/c 475/531 (post DCCV) 08/02/18: (07:27:29), AFL 85, QRS 148, QT/c 471/561 (pre DCCV)   TTE Result date: 10/26/23  1. Left ventricular ejection fraction, by estimation, is 50 to 55%. Left  ventricular ejection fraction by PLAX is 54 %. The left ventricle has low  normal function. The left ventricle has no regional wall motion  abnormalities. There is mild left  ventricular  hypertrophy. Left ventricular diastolic parameters are  indeterminate.   2. Right ventricular systolic function is normal. The right ventricular  size is normal.   3. The mitral valve is degenerative. No evidence of mitral valve  regurgitation.   4. The aortic valve is tricuspid. Aortic valve regurgitation is not  visualized. Aortic valve sclerosis is present, with no evidence of aortic  valve stenosis.   5. Aortic dilatation noted. There is borderline dilatation of the aortic  root, measuring 38 mm.    Coronary angiography Result date: 04/13/16 LM lesion, 99% stenosed. Ost Cx lesion, 99% stenosed. Prox LAD lesion, 99% stenosed. Ost 1st Diag lesion, 55% stenosed. Mid LAD lesion, 100% stenosed. Prox RCA lesion, 75% stenosed. Mid RCA lesion, 45% stenosed. Dist RCA lesion, 50% stenosed. RPDA lesion, 45% stenosed. Assessment The patient has had progressive canadian class 4 anginal symptoms with  risk factors including diabetes, high blood pressure and high cholesterol. abnormal left ventricular function with ejection fraction of 45% with hypokinesis of anterior wall severe 3 vessel coronary artery disease  There is significant stenosis of left main, lcx and lad with heavy calcium  Plan Continue medical management of CAD risk factors, Consider consultation for [name] and Additional medications for management of angina   Risk Assessment/Calculations   CHA2DS2-VASc Score = 5  This indicates a 7.2% annual risk of stroke. The patient's score is based upon: CHF History: 1 HTN History: 1 Diabetes History: 1 Stroke History: 0 Vascular Disease History: 1 Age Score: 1 Gender Score: 0   Physical Exam VS:  BP 104/62  Pulse 70   Ht 6' 4 (1.93 m)   Wt 207 lb 11.2 oz (94.2 kg)   SpO2 97%   BMI 25.28 kg/m          Wt Readings from Last 3 Encounters:  08/22/24 207 lb 11.2 oz (94.2 kg)  03/23/24 225 lb 8 oz (102.3 kg)  01/20/24 218 lb 1.6 oz (98.9 kg)    GEN: Well nourished, well  developed in no acute distress NECK: No JVD; No carotid bruits CARDIAC: RRR, no murmurs, rubs, gallops RESPIRATORY:  Clear to auscultation without rales, wheezing or rhonchi  SKIN: LEFT infraclavicular incision well healed, no erythema or swelling  EXTREMITIES:  No edema; No deformity    ASSESSMENT AND PLAN Jason [name] is a 73 y.o. male with persistent AF/AFL, CAD s/p 4v [name] (04/14/16, Dr. Sudie Laine), HFrEF s/p 1' prevention LEFT MDT DC ICD (04/12/19, Dr. Franky Ned), prior LV thrombus, DM2, and HTN who presents for AF/AFL management.   Persistent AF/AFL ICM/HFrecEF 1' prevention LEFT MDT DC ICD Prior LV thrombus  We discussed different options for management of his atrial fibrillation and why rhythm control would be more ideal than rate control with HFrecEF and HF sx. He understands that although ablation is not curative it can significantly reduce the arrhythmia burden.  He did have 1 isolated episode of atrial flutter in 2019 and has not had documented recurrence of atrial flutter since cardioversion.  My main concern for risk of ablation with [name] is his prior issues with general anesthesia and significant ileus requiring prolonged hospitalizations.  Will discuss with the anesthesia team to see if there are any adjunct of measures to reduce this risk with the procedure.   Discussed treatment options today for AF including antiarrhythmic drug therapy and ablation. Discussed risks, recovery and likelihood of success with each treatment strategy. Risk, benefits, and alternatives to EP study and ablation for afib were discussed. These risks include but are not limited to stroke, bleeding, vascular damage, tamponade, perforation, damage to the esophagus, lungs, phrenic nerve and other structures, worsening renal function, coronary vasospasm and death.  Discussed potential need for repeat ablation procedures and antiarrhythmic drugs after an initial ablation. The patient understands  these risk and wishes to proceed.  We will therefore proceed with catheter ablation at the next available time.  Carto, ICE, anesthesia are requested for the procedure.    Pre procedure details: Schedule for de novo PFA PVI (Varipulse) + RF CTI (09/11/24)  Carto/ICE/GA-anesthesia, CT scan 08/22/24 with prior LV thrombus  Hold apixaban  and all medications the morning of the procedure Take apixaban  and all medications the night prior

## 2024-09-11 NOTE — Anesthesia Procedure Notes (Addendum)
 Procedure Name: Intubation Date/Time: 09/11/2024 7:47 AM  Performed by: Evetta Delon BROCKS, CRNAPre-anesthesia Checklist: Patient identified, Emergency Drugs available, Suction available and Patient being monitored Patient Re-evaluated:Patient Re-evaluated prior to induction Oxygen Delivery Method: Circle system utilized Preoxygenation: Pre-oxygenation with 100% oxygen Induction Type: IV induction Ventilation: Two handed mask ventilation required and Oral airway inserted - appropriate to patient size Laryngoscope Size: Mac and 4 Grade View: Grade I Tube type: Oral Tube size: 7.5 mm Number of attempts: 1 Airway Equipment and Method: Stylet and Oral airway Placement Confirmation: ETT inserted through vocal cords under direct vision, positive ETCO2 and breath sounds checked- equal and bilateral Secured at: 23 cm Tube secured with: Tape Dental Injury: Teeth and Oropharynx as per pre-operative assessment

## 2024-09-11 NOTE — Discharge Instructions (Signed)

## 2024-09-11 NOTE — Anesthesia Preprocedure Evaluation (Signed)
 Anesthesia Evaluation  Patient identified by MRN, date of birth, ID band Patient awake    Reviewed: Allergy & Precautions, NPO status , Patient's Chart, lab work & pertinent test results  Airway Mallampati: II  TM Distance: >3 FB Neck ROM: Full    Dental  (+) Edentulous Upper, Edentulous Lower   Pulmonary neg pulmonary ROS   Pulmonary exam normal        Cardiovascular hypertension, Pt. on medications and Pt. on home beta blockers + CAD, + Past MI, + [name] and +CHF  + dysrhythmias Atrial Fibrillation + Cardiac Defibrillator  Rhythm:Irregular Rate:Normal  ECHO 05/25: IMPRESSIONS   1. Left ventricular ejection fraction, by estimation, is 35 to 40%. The left ventricle has moderately decreased function. The left ventricular internal cavity size was mildly dilated.  2. Right ventricular systolic function is mildly reduced. The right ventricular size is normal.  3. Left atrial size was dilated. No left atrial/left atrial appendage thrombus was detected.   FINDINGS  Left Ventricle: Left ventricular ejection fraction, by estimation, is 35 to 40%. The left ventricle has moderately decreased function. The left ventricular internal cavity size was mildly dilated.   Right Ventricle: The right ventricular size is normal. No increase in right ventricular wall thickness. Right ventricular systolic function is mildly reduced.   Left Atrium: Left atrial size was dilated. No left atrial/left atrial appendage thrombus was detected.    Neuro/Psych negative neurological ROS  negative psych ROS   GI/Hepatic Neg liver ROS,GERD  ,,  Endo/Other  diabetes, Type 2Hypothyroidism    Renal/GU Renal disease  negative genitourinary   Musculoskeletal  (+) Arthritis , Osteoarthritis,    Abdominal Normal abdominal exam  (+)   Peds  Hematology Lab Results      Component                Value               Date                      WBC                       5.5                 08/22/2024                HGB                      12.6 (L)            08/22/2024                HCT                      38.7                08/22/2024                MCV                      97                  08/22/2024                PLT                      133 (L)  08/22/2024             Lab Results      Component                Value               Date                      NA                       139                 08/22/2024                K                        4.6                 08/22/2024                CO2                      24                  08/22/2024                GLUCOSE                  149 (H)             08/22/2024                BUN                      20                  08/22/2024                CREATININE               1.27                08/22/2024                CALCIUM                   9.8                 08/22/2024                EGFR                     59 (L)              08/22/2024                GFRNONAA                 51 (L)              01/20/2024              Anesthesia Other Findings   Reproductive/Obstetrics                              Anesthesia Physical Anesthesia Plan  ASA: 3  Anesthesia Plan: General   Post-op Pain Management:    Induction: Intravenous  PONV  Risk Score and Plan: 2 and Ondansetron , Dexamethasone  and Treatment may vary due to age or medical condition  Airway Management Planned: Mask and Oral ETT  Additional Equipment: None  Intra-op Plan:   Post-operative Plan: Extubation in OR  Informed Consent: I have reviewed the patients History and Physical, chart, labs and discussed the procedure including the risks, benefits and alternatives for the proposed anesthesia with the patient or authorized representative who has indicated his/her understanding and acceptance.     Dental advisory given  Plan Discussed with: CRNA  Anesthesia Plan Comments:          Anesthesia Quick Evaluation

## 2024-09-11 NOTE — Interval H&P Note (Signed)
 History and Physical Interval Note:  09/11/2024 6:55 AM  Jason [name]  has presented today for surgery, with the diagnosis of afib.  The various methods of treatment have been discussed with the patient and family. After consideration of risks, benefits and other options for treatment, the patient has consented to  Procedure(s): ATRIAL FIBRILLATION ABLATION (N/A) as a surgical intervention.  The patient's history has been reviewed, patient examined, no change in status, stable for surgery.  I have reviewed the patient's chart and labs.  Questions were answered to the patient's satisfaction.     [name] DELENA Primus

## 2024-09-11 NOTE — Op Note (Signed)
 Procedure:  Intracardiac catheter ablation AFIB including Transseptal Cath (CPT [id number]) Additional atrial ablation of discrete mechanism (CPT [phone])  Pre-Op Diagnosis: typical AFL, persistent AF  Post-Op Diagnosis: same   Procedure Date:  09/11/24   Attending: Adina Primus, MD   Anesthesia: general anesthesia   Initial Intervals: NSR, PR 221, QRS 155, QT 427, RR 832  Procedure: The patient entered the EP lab in a fasting nonsedated state. The procedural time-out was achieved. The patient was placed under general anesthesia by [facility]. Once the patient was draped and prepped in normal fashion with multiple layers of Hibiclens  scrub in the bilateral groins, access to the veins was achieved under ultrasound guidance using the modified Seldinger technique. Following access both sites were pre closed with Perclose ProStyle suture mediated closure.    Access/sheath: - RFV: 8 Fr short sheath->8.5 Fr Sm curl Vizigo - RFV: 8 Fr short sheath  - RFV: 9 Fr short sheath   Catheters: - 8.5 Fr SoundStar ICE - Thermacool ST/SF D/F RF ablation catheter - Varipulse PFA catheter - OctaRay 3-3-3-3-3 mapping catheter  - Webster CS D/F decapolar catheter  Procedure: The patient entered the EP lab in a fasting nonsedated state. The surgical time-out was achieved. The patient was placed under GA by Anesthesiology. Once the patient was draped and prepped in normal fashion with multiple layers of Hibiclens  scrub in the bilateral groins, access to the veins was achieved in the following manner: Using ultrasound, the right groin was accessed with 3 standard access needles. One 9 Fr short sheath and two 8 Fr short sheaths were placed in the RFV. Perclose ProStyle sutures were used for preclosure of the venotomy sites. Systemic heparinization was given to maintain ACT's >350. The decapolar CS catheter was placed. An 8.5 Fr Med curl Vizigo sheath was advanced to the mid RA through which a Thermocool  ST SF D/F catheter was advanced to the mid RA.   Mapping (RA) A 3-dimensional electroanatomic map was constructed of the right atrium, the coronary sinus, the tricuspid valve and the IVC using the Carto mapping system.  The valve annulus was marked. High density points of the cavo-tricuspid isthmus were taken.    Ablation (RA): RF ablation was performed at 40W across the cavo-tricuspid isthmus which resulted in widely split doubles. Pacing was performed from the proximal CS which demonstarted medial to lateral block across the ablation line (175 ms) and lateral to medial block with RF pacing (180 ms).  Transseptal Access: Transseptal puncture was performed with the VersaCross wire through the Vizigo sheath using ICE and fluoroscopy. The sheath was carefully aspirated and flushed.      Mapping (LA): Next, a deflectable OctaRay was advanced into the left atrium through the Vizigo sheath.  A 3-dimensional electroanatomic map was constructed of the left atrium and pulmonary veins using the Carto mapping system.     Ablation (LA): The OctaRay was removed from the Vizigo sheath and the Carto Varipulse PFA catheter was advanced to the LSPV os. The PFA catheter was advanced to each of the 4 pulmonary veins and at least 4 PFA applications were applied per vein. Additional lesions were applied across both carinas and the RPV septum. Following initial ablation, repeat mapping with the Varipulse catheter confirmed first pass isolation in all 4 veins. Additional ablation was applied to the RIPV/septum to make the ablation more antral.   Post-Ablation EPS: Differential pacing performed from the proximal coronary sinus and lateral right atrium confirmed bidirectional block across the  CTI ablation line (medial->lateral 175 ms and lateral-> medial 175 ms).   Catheters and sheaths were pulled and hemostasis obtained with Perclose sutures. No complications were evident.     Final Intervals: NSR, AH 104, HV 43, PR  217, QRS 142, QT 471, RR 1001  EBL: less than 50 mL  Complications: none  Disposition: Same day discharge  Summary: NSR on arrival   Successful CTI ablation with bidirectional block across CTI-line  Successful transseptal puncture x 1 with ICE guidance  3-D mapping of the LA and PV's   Successful PV isolation ([facility]) using J&J Varipulse PFA  NSR with normal conduction intervals post-ablation    Recommendations: Bedrest x 2 hours Anti-coagulation: Resume Pradaxa  150 mg bid (next dose 1200) Anti-platelet: none  Anti-arrhythmic: d/c OP amiodarone  today  Rate control: continue OP toprol  XL 75 mg daily  EP f/u to be scheduled  [name] DELENA Primus, MD [facility]  Cardiac Electrophysiology

## 2024-09-11 NOTE — Transfer of Care (Signed)
 Immediate Anesthesia Transfer of Care Note  Patient: Jason [name]  Procedure(s) Performed: ATRIAL FIBRILLATION ABLATION  Patient Location: Cath Lab  Anesthesia Type:General  Level of Consciousness: drowsy and patient cooperative  Airway & Oxygen Therapy: Patient Spontanous Breathing and Patient connected to nasal cannula oxygen  Post-op Assessment: Report given to RN and Post -op Vital signs reviewed and stable  Post vital signs: Reviewed and stable  Last Vitals:  Vitals Value Taken Time  BP 128/66   Temp 98.4   Pulse 70   Resp 18   SpO2 100%     Last Pain:  Vitals:   09/11/24 0622  PainSc: 0-No pain         Complications: There were no known notable events for this encounter.

## 2024-09-11 NOTE — Anesthesia Postprocedure Evaluation (Signed)
 Anesthesia Post Note  Patient: Jason [name]  Procedure(s) Performed: ATRIAL FIBRILLATION ABLATION     Patient location during evaluation: PACU Anesthesia Type: General Level of consciousness: awake and alert Pain management: pain level controlled Vital Signs Assessment: post-procedure vital signs reviewed and stable Respiratory status: spontaneous breathing, nonlabored ventilation, respiratory function stable and patient connected to nasal cannula oxygen Cardiovascular status: blood pressure returned to baseline and stable Postop Assessment: no apparent nausea or vomiting Anesthetic complications: no   There were no known notable events for this encounter.  Last Vitals:  Vitals:   09/11/24 1130 09/11/24 1200  BP: 110/63 108/62  Pulse: 73 72  Resp: 15 19  Temp:    SpO2: 97% 99%    Last Pain:  Vitals:   09/11/24 1030  TempSrc: Oral  PainSc:                  Cordella SQUIBB Artavis Cowie

## 2024-09-11 NOTE — Progress Notes (Signed)
 Patient and patient wife (via phone) and friend at bedside given discharge instructions, education provided no further questions at this time. Patient able to ambulate and void before discharge. Able to tolerate PO intake. Patient site is clean, dry, intact with no hematoma noted upon discharge. Seen by MD Almetta, messaged in regards to resuming blood thinner medication after getting 1200 dose today.

## 2024-09-12 ENCOUNTER — Telehealth (HOSPITAL_COMMUNITY): Payer: Self-pay

## 2024-09-12 MED FILL — Fentanyl Citrate Preservative Free (PF) Inj 100 MCG/2ML: INTRAMUSCULAR | Qty: 2 | Status: AC

## 2024-09-12 MED FILL — Cefazolin Sodium-Dextrose IV Solution 2 GM/100ML-4%: INTRAVENOUS | Qty: 100 | Status: AC

## 2024-09-12 NOTE — Telephone Encounter (Signed)
 Attempted to reach patient to follow up with procedure completed on 09/11/24, no answer after continuous rings.

## 2024-10-09 ENCOUNTER — Encounter (HOSPITAL_COMMUNITY): Payer: Self-pay | Admitting: Internal Medicine

## 2024-10-09 ENCOUNTER — Ambulatory Visit (HOSPITAL_COMMUNITY)
Admission: RE | Admit: 2024-10-09 | Discharge: 2024-10-09 | Disposition: A | Discharge status: Home / Self Care | Source: Ambulatory Visit | Attending: Internal Medicine | Admitting: Internal Medicine

## 2024-10-09 VITALS — BP 110/72 | HR 74 | Ht 76.0 in | Wt 209.0 lb

## 2024-10-09 DIAGNOSIS — I4819 Other persistent atrial fibrillation: Secondary | ICD-10-CM | POA: Insufficient documentation

## 2024-10-09 DIAGNOSIS — I480 Paroxysmal atrial fibrillation: Secondary | ICD-10-CM | POA: Insufficient documentation

## 2024-10-09 DIAGNOSIS — D6869 Other thrombophilia: Secondary | ICD-10-CM | POA: Diagnosis not present

## 2024-10-09 NOTE — Progress Notes (Signed)
 Primary Care Physician: Montey Lot, PA-C Primary Cardiologist: None Electrophysiologist: [name] DELENA Primus, MD     Referring Physician: Dr. Primus [name] Jason [name] is a 73 y.o. male with a history of persistent AF/AFL, CAD s/p 4v [name] (04/14/16, Dr. Sudie Laine), HFrEF s/p 1' prevention LEFT MDT DC ICD (04/12/19, Dr. Franky Ned), prior LV thrombus, DM2, and HTN who presents for consultation in the [facility]. Patient is on Pradaxa  150 mg twice daily for stroke prevention.  On evaluation today, patient is currently in NSR. S/p Afib ablation on 09/11/2024 by Dr. Primus. No episodes of Afib since ablation.  Amiodarone  was stopped after ablation.  No chest pain or SOB. Leg sites healed without issue. No missed doses of anticoagulant.  Today, he denies symptoms of orthopnea, PND, lower extremity edema, dizziness, presyncope, syncope, snoring, daytime somnolence, bleeding, or neurologic sequela. The patient is tolerating medications without difficulties and is otherwise without complaint today.    he has a BMI of Body mass index is 25.44 kg/m.SABRA Filed Weights   10/09/24 1344  Weight: 94.8 kg    Current Outpatient Medications  Medication Sig Dispense Refill   acetaminophen  (TYLENOL ) 500 MG tablet Take 500-1,000 mg by mouth every 6 (six) hours as needed (pain.).     bumetanide  (BUMEX ) 1 MG tablet Take 1 tablet (1 mg total) by mouth 2 (two) times daily. (Patient taking differently: Take 1 mg by mouth in the morning.) 60 tablet 1   dabigatran  (PRADAXA ) 150 MG CAPS capsule Take 1 capsule (150 mg total) by mouth every 12 (twelve) hours. 180 capsule 1   dapagliflozin  propanediol (FARXIGA ) 10 MG TABS tablet Take 10 mg by mouth in the morning.     ENTRESTO  24-26 MG Take 1 tablet by mouth every 12 (twelve) hours.     gabapentin  (NEURONTIN ) 100 MG capsule Take 100 mg by mouth at bedtime as needed (nerve pain.).     levothyroxine  (SYNTHROID ) 50 MCG tablet  Take 50 mcg by mouth daily at 2 am.     metFORMIN  (GLUCOPHAGE -XR) 500 MG 24 hr tablet Take 1,000 mg by mouth daily with breakfast.     metoprolol  succinate (TOPROL -XL) 100 MG 24 hr tablet Take 0.5 tablets (50 mg total) by mouth daily. (Patient taking differently: Take 75 mg by mouth daily.)     potassium chloride  SA (KLOR-CON  M) 20 MEQ tablet Take 20 mEq by mouth daily.     rosuvastatin  (CRESTOR ) 40 MG tablet Take 40 mg by mouth at bedtime.     spironolactone  (ALDACTONE ) 25 MG tablet Take 1 tablet (25 mg total) by mouth daily. 30 tablet 1   No current facility-administered medications for this encounter.    Atrial Fibrillation Management history:  Previous antiarrhythmic drugs: Amiodarone  Previous cardioversions: 2019 Previous ablations: 09/11/2024 Anticoagulation history: Pradaxa    ROS- All systems are reviewed and negative except as per the HPI above.  Physical Exam: BP 110/72   Pulse 74   Ht 6' 4 (1.93 m)   Wt 94.8 kg   BMI 25.44 kg/m   GEN: Well nourished, well developed in no acute distress NECK: No JVD; No carotid bruits CARDIAC: Regular rate and rhythm, no murmurs, rubs, gallops RESPIRATORY:  Clear to auscultation without rales, wheezing or rhonchi  ABDOMEN: Soft, non-tender, non-distended EXTREMITIES:  No edema; No deformity   EKG today demonstrates  EKG Interpretation Date/Time:  Monday October 09 2024 13:47:10 EST Ventricular Rate:  74 PR Interval:  200 QRS  Duration:  144 QT Interval:  418 QTC Calculation: 463 R Axis:   -42  Text Interpretation: Normal sinus rhythm Left axis deviation Non-specific intra-ventricular conduction block Abnormal ECG When compared with ECG of 11-Sep-2024 10:08, No significant change was found Confirmed by Terra Pac [phone]) on 10/09/2024 2:08:25 PM    Echo 10/26/23 demonstrated  1. Left ventricular ejection fraction, by estimation, is 50 to 55%. Left  ventricular ejection fraction by PLAX is 54 %. The left ventricle has low   normal function. The left ventricle has no regional wall motion  abnormalities. There is mild left  ventricular hypertrophy. Left ventricular diastolic parameters are  indeterminate.   2. Right ventricular systolic function is normal. The right ventricular  size is normal.   3. The mitral valve is degenerative. No evidence of mitral valve  regurgitation.   4. The aortic valve is tricuspid. Aortic valve regurgitation is not  visualized. Aortic valve sclerosis is present, with no evidence of aortic  valve stenosis.   5. Aortic dilatation noted. There is borderline dilatation of the aortic  root, measuring 38 mm.   ASSESSMENT & PLAN CHA2DS2-VASc Score = 5  The patient's score is based upon: CHF History: 1 HTN History: 1 Diabetes History: 1 Stroke History: 0 Vascular Disease History: 1 Age Score: 1 Gender Score: 0       ASSESSMENT AND PLAN: Persistent Atrial Fibrillation (ICD10:  I48.19) The patient's CHA2DS2-VASc score is 5, indicating a 7.2% annual risk of stroke.   S/p A-fib ablation on 09/11/2024 by Dr. Doreatha.  Patient is currently in NSR.  Continue Toprol  75 mg daily.  Amiodarone  was stopped after ablation.  Secondary Hypercoagulable State (ICD10:  D68.69) The patient is at significant risk for stroke/thromboembolism based upon his CHA2DS2-VASc Score of 5.  Continue Dabigatran  (Pradaxa ).  No missed doses of Pradaxa .  Continue Pradaxa  150 mg twice daily without interruption in the blanking period.    Follow up with EP as scheduled.   Terra Pac, [facility]  Afib Clinic [address] [place], [place] [address] [phone]

## 2024-12-11 ENCOUNTER — Ambulatory Visit (HOSPITAL_COMMUNITY): Admitting: Nurse Practitioner

## 2024-12-11 ENCOUNTER — Ambulatory Visit: Admitting: Student
